# Patient Record
Sex: Female | Born: 1955 | Race: Black or African American | Hispanic: No | Marital: Married | Smoking: Never smoker
Health system: Southern US, Community
[De-identification: ages and names within clinical notes are randomized; demographics above are authoritative.]

## PROBLEM LIST (undated history)

## (undated) DIAGNOSIS — M1990 Unspecified osteoarthritis, unspecified site: Secondary | ICD-10-CM

## (undated) DIAGNOSIS — E785 Hyperlipidemia, unspecified: Secondary | ICD-10-CM

## (undated) DIAGNOSIS — E2740 Unspecified adrenocortical insufficiency: Secondary | ICD-10-CM

## (undated) DIAGNOSIS — H5440 Blindness, one eye, unspecified eye: Secondary | ICD-10-CM

## (undated) DIAGNOSIS — H53009 Unspecified amblyopia, unspecified eye: Secondary | ICD-10-CM

## (undated) DIAGNOSIS — D151 Benign neoplasm of heart: Secondary | ICD-10-CM

## (undated) DIAGNOSIS — E079 Disorder of thyroid, unspecified: Secondary | ICD-10-CM

## (undated) DIAGNOSIS — E039 Hypothyroidism, unspecified: Secondary | ICD-10-CM

## (undated) DIAGNOSIS — B9681 Helicobacter pylori [H. pylori] as the cause of diseases classified elsewhere: Secondary | ICD-10-CM

## (undated) DIAGNOSIS — R0902 Hypoxemia: Secondary | ICD-10-CM

## (undated) DIAGNOSIS — F32A Depression, unspecified: Secondary | ICD-10-CM

## (undated) DIAGNOSIS — D649 Anemia, unspecified: Secondary | ICD-10-CM

## (undated) DIAGNOSIS — IMO0001 Reserved for inherently not codable concepts without codable children: Secondary | ICD-10-CM

## (undated) DIAGNOSIS — N6009 Solitary cyst of unspecified breast: Secondary | ICD-10-CM

## (undated) DIAGNOSIS — E2749 Other adrenocortical insufficiency: Secondary | ICD-10-CM

## (undated) DIAGNOSIS — I639 Cerebral infarction, unspecified: Secondary | ICD-10-CM

## (undated) DIAGNOSIS — T7840XA Allergy, unspecified, initial encounter: Secondary | ICD-10-CM

## (undated) DIAGNOSIS — K259 Gastric ulcer, unspecified as acute or chronic, without hemorrhage or perforation: Secondary | ICD-10-CM

## (undated) DIAGNOSIS — F329 Major depressive disorder, single episode, unspecified: Secondary | ICD-10-CM

## (undated) DIAGNOSIS — Z5189 Encounter for other specified aftercare: Secondary | ICD-10-CM

## (undated) DIAGNOSIS — E249 Cushing's syndrome, unspecified: Secondary | ICD-10-CM

## (undated) DIAGNOSIS — K279 Peptic ulcer, site unspecified, unspecified as acute or chronic, without hemorrhage or perforation: Secondary | ICD-10-CM

## (undated) DIAGNOSIS — R51 Headache: Secondary | ICD-10-CM

## (undated) DIAGNOSIS — K219 Gastro-esophageal reflux disease without esophagitis: Secondary | ICD-10-CM

## (undated) DIAGNOSIS — R519 Headache, unspecified: Secondary | ICD-10-CM

## (undated) HISTORY — DX: Unspecified amblyopia, unspecified eye: H53.009

## (undated) HISTORY — DX: Peptic ulcer, site unspecified, unspecified as acute or chronic, without hemorrhage or perforation: K27.9

## (undated) HISTORY — DX: Hypothyroidism, unspecified: E03.9

## (undated) HISTORY — PX: EYE SURGERY: SHX253

## (undated) HISTORY — DX: Allergy, unspecified, initial encounter: T78.40XA

## (undated) HISTORY — DX: Benign neoplasm of heart: D15.1

## (undated) HISTORY — DX: Helicobacter pylori (H. pylori) as the cause of diseases classified elsewhere: B96.81

## (undated) HISTORY — DX: Solitary cyst of unspecified breast: N60.09

## (undated) HISTORY — PX: POLYPECTOMY: SHX149

## (undated) HISTORY — DX: Gastro-esophageal reflux disease without esophagitis: K21.9

## (undated) HISTORY — DX: Cerebral infarction, unspecified: I63.9

## (undated) HISTORY — DX: Other adrenocortical insufficiency: E27.49

## (undated) HISTORY — PX: OTHER SURGICAL HISTORY: SHX169

## (undated) HISTORY — PX: COLONOSCOPY: SHX174

---
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
## 1980-09-11 HISTORY — PX: ADRENAL GLAND SURGERY: SHX544

## 1998-05-17 ENCOUNTER — Emergency Department (HOSPITAL_COMMUNITY): Admission: EM | Admit: 1998-05-17 | Discharge: 1998-05-17 | Payer: Self-pay | Admitting: Emergency Medicine

## 1998-07-28 ENCOUNTER — Other Ambulatory Visit: Admission: RE | Admit: 1998-07-28 | Discharge: 1998-07-28 | Payer: Self-pay | Admitting: Podiatry

## 1998-09-11 HISTORY — PX: VAGINAL HYSTERECTOMY: SUR661

## 1999-06-22 ENCOUNTER — Other Ambulatory Visit: Admission: RE | Admit: 1999-06-22 | Discharge: 1999-06-22 | Payer: Self-pay

## 1999-06-25 ENCOUNTER — Encounter: Payer: Self-pay | Admitting: Emergency Medicine

## 1999-06-25 ENCOUNTER — Emergency Department (HOSPITAL_COMMUNITY): Admission: EM | Admit: 1999-06-25 | Discharge: 1999-06-25 | Payer: Self-pay | Admitting: Emergency Medicine

## 1999-06-30 ENCOUNTER — Emergency Department (HOSPITAL_COMMUNITY): Admission: EM | Admit: 1999-06-30 | Discharge: 1999-06-30 | Payer: Self-pay | Admitting: Emergency Medicine

## 1999-07-13 HISTORY — PX: BREAST BIOPSY: SHX20

## 1999-07-27 ENCOUNTER — Encounter: Payer: Self-pay | Admitting: *Deleted

## 1999-07-27 ENCOUNTER — Encounter (INDEPENDENT_AMBULATORY_CARE_PROVIDER_SITE_OTHER): Payer: Self-pay | Admitting: *Deleted

## 1999-07-27 ENCOUNTER — Ambulatory Visit (HOSPITAL_BASED_OUTPATIENT_CLINIC_OR_DEPARTMENT_OTHER): Admission: RE | Admit: 1999-07-27 | Discharge: 1999-07-27 | Payer: Self-pay | Admitting: *Deleted

## 1999-12-18 ENCOUNTER — Encounter: Payer: Self-pay | Admitting: Emergency Medicine

## 1999-12-18 ENCOUNTER — Emergency Department (HOSPITAL_COMMUNITY): Admission: EM | Admit: 1999-12-18 | Discharge: 1999-12-18 | Payer: Self-pay | Admitting: Emergency Medicine

## 2000-05-01 ENCOUNTER — Emergency Department (HOSPITAL_COMMUNITY): Admission: EM | Admit: 2000-05-01 | Discharge: 2000-05-01 | Payer: Self-pay | Admitting: Emergency Medicine

## 2000-05-19 ENCOUNTER — Emergency Department (HOSPITAL_COMMUNITY): Admission: EM | Admit: 2000-05-19 | Discharge: 2000-05-19 | Payer: Self-pay | Admitting: Emergency Medicine

## 2000-12-06 ENCOUNTER — Encounter: Payer: Self-pay | Admitting: Emergency Medicine

## 2000-12-06 ENCOUNTER — Emergency Department (HOSPITAL_COMMUNITY): Admission: EM | Admit: 2000-12-06 | Discharge: 2000-12-06 | Payer: Self-pay | Admitting: Emergency Medicine

## 2000-12-13 ENCOUNTER — Encounter (INDEPENDENT_AMBULATORY_CARE_PROVIDER_SITE_OTHER): Payer: Self-pay | Admitting: Specialist

## 2000-12-13 ENCOUNTER — Inpatient Hospital Stay (HOSPITAL_COMMUNITY): Admission: EM | Admit: 2000-12-13 | Discharge: 2000-12-15 | Payer: Self-pay | Admitting: Emergency Medicine

## 2000-12-13 ENCOUNTER — Encounter: Payer: Self-pay | Admitting: Emergency Medicine

## 2001-06-16 ENCOUNTER — Emergency Department (HOSPITAL_COMMUNITY): Admission: EM | Admit: 2001-06-16 | Discharge: 2001-06-16 | Payer: Self-pay | Admitting: Emergency Medicine

## 2001-07-22 ENCOUNTER — Emergency Department (HOSPITAL_COMMUNITY): Admission: EM | Admit: 2001-07-22 | Discharge: 2001-07-22 | Payer: Self-pay | Admitting: Emergency Medicine

## 2001-07-23 ENCOUNTER — Encounter: Payer: Self-pay | Admitting: Endocrinology

## 2002-01-01 ENCOUNTER — Emergency Department (HOSPITAL_COMMUNITY): Admission: EM | Admit: 2002-01-01 | Discharge: 2002-01-01 | Payer: Self-pay | Admitting: Emergency Medicine

## 2002-01-01 ENCOUNTER — Encounter: Payer: Self-pay | Admitting: Emergency Medicine

## 2002-01-22 ENCOUNTER — Encounter: Payer: Self-pay | Admitting: Hematology & Oncology

## 2002-01-22 ENCOUNTER — Ambulatory Visit (HOSPITAL_COMMUNITY): Admission: RE | Admit: 2002-01-22 | Discharge: 2002-01-22 | Payer: Self-pay | Admitting: Hematology & Oncology

## 2002-01-29 ENCOUNTER — Ambulatory Visit (HOSPITAL_COMMUNITY): Admission: RE | Admit: 2002-01-29 | Discharge: 2002-01-29 | Payer: Self-pay | Admitting: Hematology & Oncology

## 2002-01-29 ENCOUNTER — Encounter: Payer: Self-pay | Admitting: Hematology & Oncology

## 2002-02-27 ENCOUNTER — Encounter (INDEPENDENT_AMBULATORY_CARE_PROVIDER_SITE_OTHER): Payer: Self-pay | Admitting: Specialist

## 2002-02-27 ENCOUNTER — Encounter: Payer: Self-pay | Admitting: Hematology & Oncology

## 2002-02-27 ENCOUNTER — Ambulatory Visit (HOSPITAL_COMMUNITY): Admission: RE | Admit: 2002-02-27 | Discharge: 2002-02-27 | Payer: Self-pay | Admitting: Hematology & Oncology

## 2002-06-21 ENCOUNTER — Emergency Department (HOSPITAL_COMMUNITY): Admission: EM | Admit: 2002-06-21 | Discharge: 2002-06-21 | Payer: Self-pay | Admitting: Emergency Medicine

## 2002-07-16 ENCOUNTER — Encounter: Admission: RE | Admit: 2002-07-16 | Discharge: 2002-10-14 | Payer: Self-pay | Admitting: *Deleted

## 2004-02-08 ENCOUNTER — Emergency Department (HOSPITAL_COMMUNITY): Admission: EM | Admit: 2004-02-08 | Discharge: 2004-02-08 | Payer: Self-pay | Admitting: Emergency Medicine

## 2004-05-31 ENCOUNTER — Encounter: Admission: RE | Admit: 2004-05-31 | Discharge: 2004-05-31 | Payer: Self-pay | Admitting: Internal Medicine

## 2005-05-12 HISTORY — PX: ROTATOR CUFF REPAIR: SHX139

## 2005-05-27 ENCOUNTER — Inpatient Hospital Stay (HOSPITAL_COMMUNITY): Admission: EM | Admit: 2005-05-27 | Discharge: 2005-05-29 | Payer: Self-pay | Admitting: Emergency Medicine

## 2005-10-17 ENCOUNTER — Encounter: Admission: RE | Admit: 2005-10-17 | Discharge: 2005-11-15 | Payer: Self-pay | Admitting: Orthopedic Surgery

## 2005-11-16 ENCOUNTER — Encounter: Admission: RE | Admit: 2005-11-16 | Discharge: 2006-01-23 | Payer: Self-pay | Admitting: Orthopedic Surgery

## 2005-12-26 ENCOUNTER — Ambulatory Visit (HOSPITAL_COMMUNITY): Admission: RE | Admit: 2005-12-26 | Discharge: 2005-12-26 | Payer: Self-pay | Admitting: Cardiology

## 2006-09-10 ENCOUNTER — Ambulatory Visit: Payer: Self-pay | Admitting: Cardiology

## 2006-09-10 ENCOUNTER — Inpatient Hospital Stay (HOSPITAL_COMMUNITY): Admission: EM | Admit: 2006-09-10 | Discharge: 2006-09-13 | Payer: Self-pay | Admitting: Emergency Medicine

## 2006-09-12 ENCOUNTER — Encounter: Payer: Self-pay | Admitting: Cardiology

## 2006-10-15 ENCOUNTER — Emergency Department (HOSPITAL_COMMUNITY): Admission: EM | Admit: 2006-10-15 | Discharge: 2006-10-15 | Payer: Self-pay | Admitting: Emergency Medicine

## 2007-02-20 ENCOUNTER — Emergency Department (HOSPITAL_COMMUNITY): Admission: EM | Admit: 2007-02-20 | Discharge: 2007-02-20 | Payer: Self-pay | Admitting: Emergency Medicine

## 2007-03-22 ENCOUNTER — Ambulatory Visit (HOSPITAL_COMMUNITY): Admission: RE | Admit: 2007-03-22 | Discharge: 2007-03-22 | Payer: Self-pay | Admitting: Ophthalmology

## 2007-10-28 ENCOUNTER — Inpatient Hospital Stay (HOSPITAL_COMMUNITY): Admission: EM | Admit: 2007-10-28 | Discharge: 2007-10-30 | Payer: Self-pay | Admitting: Emergency Medicine

## 2008-04-19 ENCOUNTER — Emergency Department (HOSPITAL_COMMUNITY): Admission: EM | Admit: 2008-04-19 | Discharge: 2008-04-19 | Payer: Self-pay | Admitting: Emergency Medicine

## 2010-02-28 ENCOUNTER — Emergency Department (HOSPITAL_COMMUNITY): Admission: EM | Admit: 2010-02-28 | Discharge: 2010-02-28 | Payer: Self-pay | Admitting: Emergency Medicine

## 2010-10-02 ENCOUNTER — Encounter: Payer: Self-pay | Admitting: Cardiology

## 2010-11-08 ENCOUNTER — Inpatient Hospital Stay (INDEPENDENT_AMBULATORY_CARE_PROVIDER_SITE_OTHER)
Admission: RE | Admit: 2010-11-08 | Discharge: 2010-11-08 | Disposition: A | Discharge status: Home / Self Care | Payer: Self-pay | Source: Ambulatory Visit | Attending: Family Medicine | Admitting: Family Medicine

## 2010-11-08 DIAGNOSIS — S058X9A Other injuries of unspecified eye and orbit, initial encounter: Secondary | ICD-10-CM

## 2010-11-23 ENCOUNTER — Emergency Department (HOSPITAL_COMMUNITY): Payer: Medicaid Other

## 2010-11-23 ENCOUNTER — Inpatient Hospital Stay (HOSPITAL_COMMUNITY)
Admission: EM | Admit: 2010-11-23 | Discharge: 2010-11-26 | DRG: 644 | Disposition: A | Discharge status: Home / Self Care | Payer: Medicaid Other | Attending: Internal Medicine | Admitting: Internal Medicine

## 2010-11-23 DIAGNOSIS — R680 Hypothermia, not associated with low environmental temperature: Secondary | ICD-10-CM | POA: Diagnosis present

## 2010-11-23 DIAGNOSIS — F3289 Other specified depressive episodes: Secondary | ICD-10-CM | POA: Diagnosis present

## 2010-11-23 DIAGNOSIS — D151 Benign neoplasm of heart: Secondary | ICD-10-CM | POA: Diagnosis present

## 2010-11-23 DIAGNOSIS — A088 Other specified intestinal infections: Secondary | ICD-10-CM | POA: Diagnosis present

## 2010-11-23 DIAGNOSIS — K219 Gastro-esophageal reflux disease without esophagitis: Secondary | ICD-10-CM | POA: Diagnosis present

## 2010-11-23 DIAGNOSIS — F329 Major depressive disorder, single episode, unspecified: Secondary | ICD-10-CM | POA: Diagnosis present

## 2010-11-23 DIAGNOSIS — E161 Other hypoglycemia: Secondary | ICD-10-CM | POA: Diagnosis present

## 2010-11-23 DIAGNOSIS — R000 Tachycardia, unspecified: Secondary | ICD-10-CM | POA: Diagnosis present

## 2010-11-23 DIAGNOSIS — E872 Acidosis, unspecified: Secondary | ICD-10-CM | POA: Diagnosis present

## 2010-11-23 DIAGNOSIS — R112 Nausea with vomiting, unspecified: Secondary | ICD-10-CM | POA: Diagnosis present

## 2010-11-23 DIAGNOSIS — M6282 Rhabdomyolysis: Secondary | ICD-10-CM | POA: Diagnosis present

## 2010-11-23 DIAGNOSIS — R197 Diarrhea, unspecified: Secondary | ICD-10-CM | POA: Diagnosis present

## 2010-11-23 DIAGNOSIS — I959 Hypotension, unspecified: Secondary | ICD-10-CM | POA: Diagnosis present

## 2010-11-23 DIAGNOSIS — R4182 Altered mental status, unspecified: Secondary | ICD-10-CM | POA: Diagnosis present

## 2010-11-23 DIAGNOSIS — E2749 Other adrenocortical insufficiency: Principal | ICD-10-CM | POA: Diagnosis present

## 2010-11-23 DIAGNOSIS — E039 Hypothyroidism, unspecified: Secondary | ICD-10-CM | POA: Diagnosis present

## 2010-11-23 LAB — BLOOD GAS, ARTERIAL
Acid-base deficit: 12.1 mmol/L — ABNORMAL HIGH (ref 0.0–2.0)
Bicarbonate: 14.5 mEq/L — ABNORMAL LOW (ref 20.0–24.0)
FIO2: 0.21 %
pH, Arterial: 7.269 — ABNORMAL LOW (ref 7.350–7.400)

## 2010-11-23 LAB — POCT I-STAT, CHEM 8
Chloride: 115 mEq/L — ABNORMAL HIGH (ref 96–112)
HCT: 43 % (ref 36.0–46.0)
Potassium: 4.6 mEq/L (ref 3.5–5.1)
Sodium: 134 mEq/L — ABNORMAL LOW (ref 135–145)

## 2010-11-23 LAB — COMPREHENSIVE METABOLIC PANEL
ALT: 19 U/L (ref 0–35)
AST: 34 U/L (ref 0–37)
Alkaline Phosphatase: 44 U/L (ref 39–117)
CO2: 15 mEq/L — ABNORMAL LOW (ref 19–32)
Calcium: 7.8 mg/dL — ABNORMAL LOW (ref 8.4–10.5)
Chloride: 114 mEq/L — ABNORMAL HIGH (ref 96–112)
Creatinine, Ser: 1.18 mg/dL (ref 0.4–1.2)
GFR calc non Af Amer: 48 mL/min — ABNORMAL LOW (ref >60)
Glucose, Bld: 70 mg/dL (ref 70–99)
Potassium: 4.4 mEq/L (ref 3.5–5.1)

## 2010-11-23 LAB — LIPASE, BLOOD: Lipase: 18 U/L (ref 11–59)

## 2010-11-24 LAB — DIFFERENTIAL
Basophils Absolute: 0 10*3/uL (ref 0.0–0.1)
Eosinophils Relative: 4 % (ref 0–5)
Lymphocytes Relative: 40 % (ref 12–46)
Lymphs Abs: 2.9 10*3/uL (ref 0.7–4.0)
Monocytes Relative: 13 % — ABNORMAL HIGH (ref 3–12)
Neutro Abs: 3.2 10*3/uL (ref 1.7–7.7)

## 2010-11-24 LAB — URINALYSIS, ROUTINE W REFLEX MICROSCOPIC
Bilirubin Urine: NEGATIVE
Glucose, UA: NEGATIVE mg/dL
Specific Gravity, Urine: 1.019 (ref 1.005–1.030)
Urobilinogen, UA: 0.2 mg/dL (ref 0.0–1.0)

## 2010-11-24 LAB — GLUCOSE, CAPILLARY
Glucose-Capillary: 54 mg/dL — ABNORMAL LOW (ref 70–99)
Glucose-Capillary: 241 mg/dL — ABNORMAL HIGH (ref 70–99)
Glucose-Capillary: 244 mg/dL — ABNORMAL HIGH (ref 70–99)

## 2010-11-24 LAB — MRSA PCR SCREENING: MRSA by PCR: NEGATIVE

## 2010-11-24 LAB — CBC
HCT: 41 % (ref 36.0–46.0)
Hemoglobin: 13.4 g/dL (ref 12.0–15.0)
MCH: 29.9 pg (ref 26.0–34.0)
MCHC: 32.7 g/dL (ref 30.0–36.0)
MCV: 91.5 fL (ref 78.0–100.0)

## 2010-11-24 LAB — TROPONIN I: Troponin I: 0.04 ng/mL (ref 0.00–0.06)

## 2010-11-24 LAB — URINE MICROSCOPIC-ADD ON

## 2010-11-25 LAB — CBC
Hemoglobin: 11.4 g/dL — ABNORMAL LOW (ref 12.0–15.0)
MCH: 29.9 pg (ref 26.0–34.0)
MCHC: 33 g/dL (ref 30.0–36.0)
Platelets: 245 10*3/uL (ref 150–400)

## 2010-11-25 LAB — BASIC METABOLIC PANEL
BUN: 7 mg/dL (ref 6–23)
CO2: 23 mEq/L (ref 19–32)
Calcium: 8.5 mg/dL (ref 8.4–10.5)
Chloride: 107 mEq/L (ref 96–112)
Creatinine, Ser: 0.9 mg/dL (ref 0.4–1.2)
GFR calc Af Amer: >60 mL/min (ref >60)
Glucose, Bld: 209 mg/dL — ABNORMAL HIGH (ref 70–99)

## 2010-11-25 LAB — GLUCOSE, CAPILLARY
Glucose-Capillary: 145 mg/dL — ABNORMAL HIGH (ref 70–99)
Glucose-Capillary: 204 mg/dL — ABNORMAL HIGH (ref 70–99)

## 2010-11-26 LAB — CBC
HCT: 33.3 % — ABNORMAL LOW (ref 36.0–46.0)
Hemoglobin: 10.8 g/dL — ABNORMAL LOW (ref 12.0–15.0)
MCH: 29.8 pg (ref 26.0–34.0)
MCV: 91.7 fL (ref 78.0–100.0)
Platelets: 257 10*3/uL (ref 150–400)
RBC: 3.63 MIL/uL — ABNORMAL LOW (ref 3.87–5.11)
WBC: 9.6 10*3/uL (ref 4.0–10.5)

## 2010-11-26 LAB — CK: Total CK: 659 U/L — ABNORMAL HIGH (ref 7–177)

## 2010-11-26 LAB — BASIC METABOLIC PANEL
BUN: 9 mg/dL (ref 6–23)
CO2: 26 mEq/L (ref 19–32)
Chloride: 110 mEq/L (ref 96–112)
Creatinine, Ser: 0.77 mg/dL (ref 0.4–1.2)
Potassium: 3.8 mEq/L (ref 3.5–5.1)

## 2010-12-13 NOTE — Discharge Summary (Signed)
NAMEJARIKA, [name]            ACCOUNT NO.:  [id number]  MEDICAL RECORD NO.:  [id number]           PATIENT TYPE:  I  LOCATION:  1318                         FACILITY:  [facility]  PHYSICIAN:  Larina Earthly, M.D.        DATE OF BIRTH:  12/24/1955  DATE OF ADMISSION:  11/23/2010 DATE OF DISCHARGE:  11/26/2010                              DISCHARGE SUMMARY   DISCHARGE DIAGNOSES: 1. 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 2. Nausea, vomiting and diarrhea thought to be secondary to viral     gastroenteritis in the setting of Norovirus outbreak in the     community, now resolved with supportive intravenous fluids and     antiemetics, tolerating regular diets without incidence. 3. Rhabdomyolysis likely secondary to being bedbound for 3 days prior     to hospitalization in the setting of nausea and vomiting.  CK level     initially high at 814, now stable at 659 with no musculoskeletal     complaints, in need of recheck with adequate hydration on an     outpatient basis. 4. Hypothyroidism, clinically euthyroid with normal TSH. 5. Gastroesophageal reflux disease, asymptomatic on proton pump     inhibitor. 6. Depression, clinically stable.  DISCHARGE MEDICATIONS: 1. Effexor XR 150 mg p.o. daily. 2. Hydrocortisone 20 mg in the morning, half a tablet in the evening.     Patient to double dose for 3 days at the time of discharge and then     resume previous dose after these 3 days. 3. Omeprazole one p.o. b.i.d. that is 20 mg. 4. Synthroid 75 mcg one p.o. daily. 5. Visine 1 drop in both eyes as needed. 6. Ambien 10 mg p.o. q.h.s. p.r.n.  DISCHARGE INSTRUCTIONS:  Patient to call for followup office visit within the next 1 week.  Patient is also further  instructed to call if she develops any dizziness, lightheadedness, recurrent nausea, vomiting or diarrhea or altered mental status.  She is going to be discharged home with her husband.  She will need followup within the week for reevaluation of electrolytes, CK level, hemodynamic status and possible nutrition consult given her significant concerns of her history of hyperlipidemia.  DISCHARGE LABORATORY DATA:  Laboratory evaluation at last set indicates white blood cell count 9.6, hematocrit 10.8%, platelet count 257, hematocrit 33%.  Sodium 139, potassium 3.9, serum CO2 26, BUN 9, creatinine 0.77, glucose 111, calcium 8.6.  CK level now 659, previously 1140.  TSH normal at 0.909.  Liver function tests normal.  Lipase normal.  Albumin 2.7.  Lactic acid normal.  KUB, chest x-ray unremarkable.  Urinalysis unremarkable for infection.  HISTORY OF PRESENT ILLNESS:  Please see the history and physical dictated by my partner Dr. Buren Kos on November 24, 2010; however, briefly this is a 55 year old African-American female with a history of Carney syndrome, status post adrenalectomy with recurrent adrenal  crisis in 2007 and 2009, who presented to the emergency room with nausea, vomiting and diarrhea for 3 days with inability to keep food or liquids down.  She has tried to continue taking her hydrocortisone; however, given the nausea and vomiting, diarrhea, she was unable to do so and presented via EMS with hypotension, hypothermia, tachycardia, acidotic with a bicarb of 15 and a pH of 7.27 with depressed mental status.  She was subsequently admitted for further evaluation and management to include high-dose Solu-Cortef and IV fluids.  HOSPITAL COURSE:  Over the next 24-48 hours, the patient's mental status cleared.  Patient remained hemodynamically stable and her Solu-Cortef dose was discontinued and prior to discharge, the patient was ambulatory without hindrance, eating well without nausea,  vomiting or diarrhea. Denied any musculoskeletal complaints including muscle aches or joint aches.  She denied any focal neurological deficits with the exception of her congenital deviated eye abnormalities.  She states that she is able to drink adequate amounts of fluid and denied any abdominal complaints. She was thought appropriate for discharge with her husband and quit followup in the office as dictated above.  Greater than 30 minutes spent in discharge planning, arrangements and evaluation of patient and laboratory values.     Larina Earthly, M.D.     RA/MEDQ  D:  11/26/2010  T:  11/27/2010  Job:  [id number]  Electronically Signed by Larina Earthly M.D. on 12/13/2010 10:11:54 PM

## 2010-12-28 NOTE — H&P (Signed)
NAMEJOAQUINA, [name] NO.:  [id number]  MEDICAL RECORD NO.:  [id number]           PATIENT TYPE:  E  LOCATION:  WLED                         FACILITY:  [facility]  PHYSICIAN:  Kari Baars, M.D.  DATE OF BIRTH:  05-Mar-1956  DATE OF ADMISSION:  11/24/2010 DATE OF DISCHARGE:                             HISTORY & PHYSICAL   CHIEF COMPLAINT:  Nausea, vomiting, and diarrhea.  HISTORY OF PRESENT ILLNESS:  [name] is a 55 year old African American female with a history of Carney syndrome, status post adrenalectomy with recurrent adrenal crises (2007 and 2009) who presented to the emergency department with nausea, vomiting, and diarrhea for the past 3 days.  The patient states that she was in her usual state of health until Monday (3 days ago) when she developed acute onset nausea, vomiting, and diarrhea.  This has been characterized by at least 10 episodes of food to clear emesis per day.  She has had about 5 episodes of watery diarrhea per day.  She has had difficulty keeping liquids, solids, and her medications down.  This includes her hydrocortisone which she has continued to try to take religiously but is having difficulty keeping anything down.  She was brought to the emergency department by EMS where she was initially hypotensive and hypothermic, tachycardic, and acidotic (bicarb 15 with a pH of 7.27, pCO2 of 32, and pO2 of 79 on initial blood gas).  Her mental status was initially depressed but has improved with hydration, although she still feels very weak even after 3 L of IV fluids.  We were called for admission, and I ordered the dose of Solu-Cortef which she has now received.  REVIEW OF SYSTEMS:  All systems reviewed with the patient are negative except in HPI with following exceptions:  Diffuse abdominal pain. Denies chest pain.  She has been bed-bound for the past 3 days and feels very weak.  PAST MEDICAL HISTORY: 1. Carney syndrome, status post  adrenalectomy and status post atrial     myxoma resection. 2. Hypothyroidism. 3. 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 4. Gastroesophageal reflux disease. 5. Depression. 6. Hyperlipidemia. 7. History of peptic ulcer disease with H. pylori positive. 8. Migraine headaches. 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 11.Status post partial hysterectomy. 12.Amblyopia, status post prior eye surgery.  CURRENT MEDICATIONS: 1. Effexor XR 150 mg daily. 2. Neurontin 300 mg at bedtime. 3. Levoxyl 75 mg daily. 4. Omeprazole 20 mg b.i.d. 5. Hydrocortisone 20 mg in the morning and one half in the evening     (according to our records, this was refilled on October 07, 2010,     for 4 months and she reports compliance with this medication).  ALLERGIES: 1. PENICILLIN. 2. ASPIRIN.  SOCIAL HISTORY:  She is a widow and is remarried.  She is 2 grown children.  She is disabled due to her Carney.  Denies any tobacco, alcohol, or drug use.  FAMILY HISTORY:  Positive for inherited Carney syndrome and acromegaly.  PHYSICAL EXAMINATION:  VITAL SIGNS:  Temperature 94.3 rectally initially and  98.1, currently off of bear hugger.  Blood pressure initially 90/43 and now 114/51, heart rate 123, respirations 20, and ox saturation 100% on room air. GENERAL:  Lethargic African American female lying in fetal position. HEENT:  She has a disconjugate gaze.  No scleral icterus.  Oropharynx is dry. NECK:  Supple without lymphadenopathy, JVD, or carotid bruits. HEART:  Tachycardic without murmurs, rubs, or gallops. LUNGS:  Clear to auscultation bilaterally. ABDOMEN:  Soft and nondistended with diffuse tenderness throughout.  No rebound or guarding.  No mass appreciated. EXTREMITIES:  No clubbing, cyanosis, or edema.  Her extremities are warm. NEUROLOGIC:  Nonfocal but she does have a flat affect.  LABORATORY DATA:  CBC  shows a white count of 7.3, hemoglobin of 13.4, and platelets 231,000.  BMET significant for sodium 134, potassium 4.4, chloride 114, bicarb 15, BUN 36, creatinine 1.18, glucose 70.  Albumin 2.7.  Liver function tests normal.  TSH 0.909, lactic acid 1.4, and troponin 0.04.  CK 814 with 5.6 MB, lipase 18.  Urinalysis negative. Arterial blood gas drawn at 11:00 p.m. was pH 7.27, pCO2 of 32, and pO2 of 79.  DIAGNOSTIC STUDIES:  Acute abdominal series personally reviewed shows normal bowel gas pattern.  EKG is reportedly normal per the ED, although not available for review at this moment.  ASSESSMENT/PLAN: 1. Adrenal crisis resulting in hypotension, tachycardia, hypothermia,     and metabolic acidosis with altered mental status.  The patient     does report compliance with medications but has been unable to     maintain p.o. intake in the setting of her nausea, vomiting, and     diarrhea (likely viral gastroenteritis).  She will be admitted for     intravenous Solu-Cortef which she has already received x1.     Aggressive intravenous fluid hydration.  She appears to be     stabilizing hemodynamically but we will monitor closely in the step-     down unit given her acute presentation. 2. Nausea, vomiting, and diarrhea - likely secondary to viral     gastroenteritis in the setting of a Norovirus outbreak in the     community.  We will treat supportively with intravenous fluids and     antiemetics.  Clear liquid diet, will be advanced as tolerated. 3. Rhabdomyolysis - likely secondary to being bed bound for the past 3     days and vomiting.  Monitor creatine kinase levels with hydration.     Normal renal function at this time. 4. Hypoglycemia - this is secondary to adrenal crisis.  This should     improve with steroids but will be monitored. 5. Hypothyroidism - continue Synthroid. 6. Depression - continue Effexor as tolerated.  DISPOSITION:  Anticipate discharge in 3-4 days to home with her  husband.     Kari Baars, M.D.     WS/MEDQ  D:  11/24/2010  T:  11/24/2010  Job:  [id number]  cc:   [name], M.D. Fax: [phone]  Electronically Signed by [name]. [name] M.D. on 12/28/2010 02:39:48 PM

## 2011-01-24 NOTE — Discharge Summary (Signed)
NAMEJIMENA, WIECZOREK NO.:  [id number]   MEDICAL RECORD NO.:  [id number]          PATIENT TYPE:  INP   LOCATION:  1342                         FACILITY:  [facility]   PHYSICIAN:  Gwen Pounds, MD       DATE OF BIRTH:  May 07, 1956   DATE OF ADMISSION:  10/28/2007  DATE OF DISCHARGE:  10/30/2007                               DISCHARGE SUMMARY   PRIMARY CARE Jini Horiuchi:  Jeannett Senior A. [name], M.D.   DISCHARGE DIAGNOSES:  1. Gastroenteritis-induced adrenal crisis with nausea, vomiting, and      diarrhea.  2. History of prior adrenal crisis with gastrointestinal infection      versus medication noncompliance.  3. Peptic ulcer disease with H. Pylori in 2002.  4. Gastroesophageal reflux.  5. History of anemia.  6. Hypothyroidism.  7. Depression.  8. Insomnia.  9. Hyperlipidemia.  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  11.History of partial hysterectomy and breast cysts removed and      recurrent headaches.  12.Amblyopia.   DISCHARGE MEDICATIONS:  1. Omeprazole 20 q. day.  2. Effexor XR 150 q. day.  3. Ambien 10 mg at bedtime p.r.n.  4. Levoxyl 88 mcg daily.  5. Diazepam 5 mg three times a day as needed.  6. Hydrocortisone 40 mg in the morning, 20 mg in the afternoon for one      week, then 20 mg in the morning and 10 mg  In the afternoon thereafter.  1. Simvastatin 40 mg p.o. q. daily.   She is to follow up with Dr. Laurene Footman in one to two weeks.  She will  call Tisha at [phone] to set up an appointment.   DISCHARGE PROCEDURES:  Include medical management.   HISTORY OF PRESENT ILLNESS:  Briefly, Ms. Heumann is a 55 year old  female who presented to medical attention with a three-day history of  nausea, vomiting, and diarrhea and exposure to gastroenteritis.  In the  ED, she was seen and evaluated.  Her vitals were all normal.  She was  not in any distress.  Urinalysis was negative with a high specific  gravity.  Lipase was 14.  Liver tests  were fine except for AST of 47.  Sodium was a little low at 131, BUN was 21, creatinine was 1.37.  Dr.  Rodrigo Ran was called for inpatient admission.   HOSPITAL COURSE:  Ms. Shakelia Scrivner had an outpatient viral  gastroenteritis which was causing her to start getting dehydrated and  starting to go into adrenal insufficiency.  She had mild hyponatremia.  Dr. Waynard Edwards  saw her in the emergency room, admitted her, hydrated her  with normal saline, placed her on a clear liquid diet, gave her  antiemetics and IV cortisone.  Follow up labs here at the hospital stay  and gave supportive care.  She improved dramatically and quite quickly.  The following day on February 17, her sodium was up to 134, her bicarb  was down to 17, but her BUN and creatinine improved.  Her albumin was a  little low at 3.3.  Physical exam started to improve.  She felt better.  The next day on rounds I saw her.  She had no complaints.  She was  eating, drinking, and walking.  She had no nausea or vomiting.  All her  vital signs were stable.  She was alert and oriented x3.  It was easily  determined that she can go home today, and she will follow up by Dr.  [name] in one to two weeks.  Her sister is in the room and requesting a  nutrition consult before she leaves the hospital due to the fact that  Ms. Dorthy Cooler eats incredibly poorly including potato chips for  breakfast and junk food throughout the rest of the day.  Family wants  this stopped and wants her to eat a more balanced, more nutritious meal.  I explained the concept of adrenal crisis to the patient and told her  why we are bumping up the steroids and when to bring them back down.  With her sister in the room, she seems to understand and get what her  goals are.  On October 30, 2007, there were no new labs.  Her last TSH  was 0.238.  Her last CMP showed a sodium of 134, potassium 4.7, chloride  103, bicarb 17, glucose 139, BUN 21, creatinine 1.15,  albumin was 3.3.  Her last CBC showed a white count of 5.1, hemoglobin 14.3, platelet  count was 269, phosphorus 2.7, magnesium 1.9.  This lab work can be  easily repeated as an outpatient.  She looks great and is ready to go  home.      Gwen Pounds, MD  Electronically Signed     JMR/MEDQ  D:  10/30/2007  T:  10/30/2007  Job:  [id number]   cc:   Tera Mater. [name], M.D.  Fax: [phone]

## 2011-01-24 NOTE — H&P (Signed)
[name], MINCHEW NO.:  [id number]   MEDICAL RECORD NO.:  [id number]          PATIENT TYPE:  EMS   LOCATION:  ED                           FACILITY:  [facility]   PHYSICIAN:  Mark A. Perini, M.D.   DATE OF BIRTH:  1956/09/05   DATE OF ADMISSION:  10/28/2007  DATE OF DISCHARGE:                              HISTORY & PHYSICAL   CHIEF COMPLAINT:  Nausea, vomiting.   HISTORY OF PRESENT ILLNESS:  [name] is a 55 year old female known to me  from her admission approximately 1 year ago.  She presents with vomiting  and diarrhea which started 3 days ago.  There were some sick contacts  and there has been a gastroenteritis circulating in the community.  She  denies any blood from above or below.  She has had some abdominal  discomfort.  It is felt she will require admission for probable adrenal  crisis.   PAST MEDICAL HISTORY:  1. Admission in December 2007 for adrenal crisis secondary to possible      viral GI infection versus medication noncompliance.  2. Abnormal cardiac enzymes with a normal cardiac echo at that time.  3. Peptic ulcer disease with H. pylori in 2002.  4. Gastroesophageal reflux.  5. History of anemia.  6. Hypothyroid.  7. Depression.  8. Insomnia.  9. Hyperlipidemia.  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  11.History of partial hysterectomy and breast cyst removed and      recurrent headaches.   ALLERGIES:  1. ASPIRIN.  2. PENICILLIN.  3. IBUPROFEN.   MEDICATIONS:  1. Omeprazole 20 mg daily.  2. Effexor XR 150 mg daily.  3. Ambien 10 mg each evening as needed.  4. Levoxyl 88 mcg daily.  5. Diazepam 5 mg daily.  6. Hydrocortisone 20 mg in the morning, 10 mg in the p.m.  7. Zocor possibly 40 mg each evening.  She is not entirely sure if she      is on this.   In the Emergency Room she has been given Zofran dose twice and 100 mg of  IV hydrocortisone as well as Imodium and normal saline.   SOCIAL HISTORY:  She has 2  children.   FAMILY HISTORY:  Noncontributory.   PHYSICAL EXAM:  VITAL SIGNS:  Temperature 97.7, blood pressure 109/63,  pulse 53, respiratory 18, 100% saturation on room air.  GENERAL:  She is in no acute distress.  She is alert.  NECK:  No JVD.  HEENT:  No pallor or icterus.  LUNGS:  Clear to auscultation bilaterally with no wheezes, rales, or  rhonchi.  HEART:  Regular rate and rhythm with no significant murmur, rub, or  gallop.  ABDOMEN:  Soft with positive bowel sounds.  It is slightly tender but  there is no rebound, no guarding, and there are no masses.  EXTREMITIES:  There is no peripheral edema.  She moves extremities x4.  NEUROLOGIC:  Grossly neurologically intact.  She does have anisocoria.   LABORATORY STUDIES:  Urinalysis shows many squamous cells, zero to 2  white cells, 3-to-6 red cells, and many bacteria.  Urinalysis is  turbid  with a specific gravity of 1.026, small bilirubin, and small blood.  Lipase is 14.  Liver tests are normal with the exception of a slightly  elevated AST of 47.  Sodium 131, potassium 4.3, chloride 98, CO2 18, BUN  21, creatinine 1.37, glucose 102.  GFR 50, baseline BUN and creatinine  were 9 and 0.8 from 1 year ago.   ASSESSMENT AND PLAN:  A 55 year old female with adrenal insufficiency  with 3 days of nausea, vomiting, and inability to take her medications.  She is most likely in adrenal crisis.  She also has hyponatremia and  some acute renal insufficiency and some abdominal discomfort which is  most likely due to her adrenal crisis or gastroenteritis.  We will admit  her.  We will hydrate with normal saline.  We will place her on a clear  liquid diet.  We will treat with antiemetics and we will give her IV  hydrocortisone.  We will follow up lab work including TSH, CBC, diff,  magnesium, and phosphorus.  Care will be mostly supportive in nature,  however, if she continues to have abdominal pain she may need further  evaluation such as CT  scan.      Mark A. Perini, M.D.  Electronically Signed     MAP/MEDQ  D:  10/28/2007  T:  10/29/2007  Job:  [id number]

## 2011-01-27 NOTE — Discharge Summary (Signed)
[name], [name]            ACCOUNT NO.:  [id number]   MEDICAL RECORD NO.:  [id number]          PATIENT TYPE:  INP   LOCATION:  1429                         FACILITY:  [facility]   PHYSICIAN:  Tera Mater. [name], M.D. DATE OF BIRTH:  1956-08-18   DATE OF ADMISSION:  09/10/2006  DATE OF DISCHARGE:  09/13/2006                               DISCHARGE SUMMARY   DISCHARGE DIAGNOSES:  1. Acute adrenal crisis due to nausea and vomiting and missed      medications, now clinically resolved.  2. Probable viral gastroenteritis.  3. Abnormal cardiac enzymes, exact source uncertain.  4. Normal cardiac echo.  5. Peptic ulcer disease with mild anemia.  6. Primary hypothyroidism.  7. Depression.  8. Insomnia.  9. Hyperlipidemia.   CONSULTATIONS:  Were with cardiology, I believe Dr. Jens Som saw her the  first day.   PROCEDURES:  Included an echocardiogram.   [name] is a 55 year old black female with a history of Carney's  syndrome with prior adrenalectomy as well as atrial myxoma.  She  presented to my partner, Dr. Waynard Edwards on September 10, 2006 with nausea,  vomiting, hypotension and electrolyte disorders consistent with adrenal  crisis.  She had missed two to three days of her medications and this is  in the setting of a significant illness.  Fortunately with IV steroids  and fluid resuscitation, she improved fairly promptly.  Her cardiac  enzymes, however, were significantly abnormal and cardiology was asked  to see the patient.  The exact cause of her abnormal enzymes has not  been delineated.  She does have abnormal echo.  It is concerning the  levels were as high as they were without more hypotension or severe  sepsis or other cause.  Further investigation will be undertaken as an  outpatient.  The patient is doing well.  She has had no significant  fever.  Blood pressure this morning was 116/64, pulse is 98 and sinus  rhythm on the monitor.  Respirations 16, O2 sats 97% on room  air.  She  has no complaints this morning.  She is eating well.  She is not  orthostatic in symptomatology and overall much improved.   LABORATORY DATA:  This morning a white count 7700, hemoglobin was 10.5,  MCV 89.9, platelets 285,000.  Her sodium was 136, potassium 3.7,  chloride 109, CO2 26, BUN 9, creatinine 0.8, glucose 77, calcium was  8.2.  Prior labs also included a total cholesterol of 208, triglycerides  132, HCl of 30 and LDL of 152.  Her white count at presentation was  8500, hemoglobin 17.4, platelets 365,000.  A blood gas on 2 liters pH  7.338, pCO2 of 37, pO2 of 132.  Chemistries at presentation sodium 132,  potassium 4.2, chloride 98, CO2 21, BUN 23, creatinine 1.4, glucose 90,  calcium 10.4.  Albumin was low at 3.2, total bili was 1.7, AST was 33,  alk phos was 46 and ALT was 16.  Magnesium was 1.7, phosphorous 4.0.  Initial CK was 515 with an MP of 10.6 and a relative index of 2.1 with a  troponin 0.47.  Repeat was 474 with an MP of 10.4, relative index of 2.2  and a troponin of 0.46.  Repeat 8 hours later CK of 412, CK-MP of 9.6  with a relative index of 2.3 and troponin 0.43.  Subsequent numbers CK  325, MP of 8.5, relative index 2.6 and troponin 0.34 and yesterday  morning CK of 140, MP 4.7, relative index 3.1 and troponin 0.33.   RADIOLOGY STUDIES:  A chest x-ray showed no acute cardiopulmonary  abnormalities on both the 31st and the 1st.  Her echocardiogram showed  conclusions overall left ventricular systolic function was normal, left  ventricular ejection fraction estimated 55 to 65%.  No left ventricular  regional wall motion abnormalities.  No pericardial effusion.   SUMMARY:  we have a 55 year old black female presenting with adrenal  crisis with additional problems of abnormal cardiac enzymes.  The  adrenal problem has settled back down and the patient will be given  double dose of her routine steroids for an addition of one week and then  return to her  baseline therapy.  Her thyroid medication will be  continued.  Further cardiac workup will be as determined by the  cardiology service.  Her diet will be regular.  She has no pain needs.   DISCHARGE MEDICATIONS:  Include omeprazole 20 twice daily, she will now  be on aspirin 81 milligrams once daily.  She will be started back on her  Effexor XR 150 once daily, Neurontin 300 once daily, hydrocortisone will  be 40 in the morning and 20 in the evening for one week and then 20 in  the morning and 10 in the evening thereafter.  Her new medication of  Zocor as a generic 40 milligrams once daily.  Her thyroid medication  will be 88 micrograms once daily.  She is to call if she has chest pain  or repeat nausea and vomiting or other difficulties.           ______________________________  Tera Mater [name], M.D.     SAS/MEDQ  D:  09/13/2006  T:  09/13/2006  Job:  [id number]
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
## 2011-01-27 NOTE — Consult Note (Signed)
[place]  Patient:    [name], [name]                   MRN: [id number] Proc. Date: 12/13/00 Adm. Date:  [id number] Attending:  Lorre Nick CC:         Lennon Alstrom. Felipa Eth, M.D.  Petra Kuba, M.D.   Consultation Report  [name] is a 55 year old female whom I am asked to see by Dr. Felipa Eth because of intractable nausea and vomiting with small amount of blood in the vomitus according to the patient.  She denies any melena or hematochezia.  She has a past history of reflux diseases and peptic ulcers in 1997 documented in the stomach and duodenum on upper GI series.  At that time, she was Helicobacter pylori positive on both antibody tests and on biopsies which were not done until after the ulcers had healed with Prilosec.  She had been somewhat noncompliant with office followup and with appointments with endoscopy and apparently did not complete her anti-Helicobacter antibiotic regimen.  She reports to me that she is supposed to be on a proton pump inhibitor for chronic reflux, but she cannot afford it, so she has not taken it in a long time.  Her affect is extremely depressed and sullen, and she does not elaborate when answering questions.  Apparently she has a history of depression and has a variety of possibly related complaints such as back aches and headaches.  PAST MEDICAL HISTORY:  Interesting in that she has a history of adrenal insufficiency after having questionable adrenal masses removed in earlier life.  She also is hypothyroid and has a history of depression.  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  FAMILY HISTORY:  Pertinent for a father with ulcers and gallstones.  SOCIAL HISTORY:  She is widowed, nonsmoker, nondrinker.  REVIEW OF SYSTEMS:  General:  No weight loss or night sweats.  Endocrine: Positive for adrenal insufficiency on Cortisol and hypothyroidism.  Skin:  No rash or pruritus.  Eyes: No icterus or change in vision.  ENT: No aphthous ulcers or chronic sore throat.  Respiratory: No shortness of breath, cough, or wheezing.  Cardiac: As above.  GI: As above.  GU: No dysuria or hematuria. The remainder of the review of systems is negative.  PHYSICAL EXAMINATION:  GENERAL:  She is a very depressed-appearing female in no acute distress.  VITAL SIGNS:  Afebrile with a blood pressure of 151/98 and a pulse of 92. Respiratory rate is 16.  SKIN:  Normal.  HEENT:  Eyes are anicteric.  Oropharynx is unremarkable.  NECK:  Supple without thyromegaly.  There is no cervical or inguinal adenopathy.  CHEST:  Clear.  HEART:  Regular rate and rhythm without murmurs, rubs, or gallops.  ABDOMEN:  Soft with epigastric and right upper quadrant tenderness.  There are normal bowel sounds without rebound or mass.  I do not appreciate any hernias.  RECTAL:  Exam not done.  EXTREMITIES:  Without edema.  LABORATORY DATA:  Hemoglobin 14.6.  Electrolytes are normal.  White blood count is normal.  Creatinine 12.  LFTs are normal.  Amylase is minimally elevated at 146, but lipase is normal.  Urinalysis reveals ketones.  Electrocardiogram reveals normal sinus rhythm with question of inferior ischemia and anterolateral ischemia.  Chest x-ray reveals mild cardiomegaly.  Abdominal x-ray series is negative.  Abdominal ultrasound done last week when she presented with  similar symptoms demonstrates a mildly prominent pancreatic duct but is otherwise normal.  IMPRESSION:  A 55 year old female with nausea, vomiting, and abdominal discomfort but essentially normal laboratory parameters.  I suspect the minimally elevated amylase is insignificant, and the ultrasonogram does not show any specifically worrisome pancreatic pathology.  She may have an exacerbation of peptic disease or reflux possibly aggravated by her depression.  PLAN:  Hydrate the patient with  intravenous Protonix and Reglan.  Follow serial hemoglobins and stool guaiacs.  I do not believe she is bleeding significantly.  I will consider endoscopy tomorrow if she is agreeable. DD:  12/13/00 TD:  12/13/00 Job: [id number]

## 2011-01-27 NOTE — H&P (Signed)
[place]  Patient:    [name], [name] Visit Number: [id number] MRN: [id number]          Service Type: MED Location: 1800 [id number] 01 Attending Physician:  Shelba Flake Dictated by:   Gaspar Garbe, M.D. Admit Date:  07/22/2001                           History and Physical  PRIMARY CARE PHYSICIAN:  [name], [facility].  CHIEF COMPLAINT:  Nausea, vomiting, dizziness.  HISTORY OF PRESENT ILLNESS:  The patient is a 55 year old African-American female with known hypothyroidism and adrenal insufficiency for seven to eight years with variable compliance in her medications.  She indicates that she was last seen by Dr. [name] approximately one week ago; however, she states that she was having abdominal pain for a period of about two weeks.  She indicates that in the past week she had been fairly noncompliant with her medications and, over the past couple of days, has developed nausea, vomiting, diarrhea, and worsening abdominal pain to the point that even if she were compliant with her medications she was unable to keep them down.  She states that she has had continued burning abdominal pain since April and has received an EGD and treatment for H. pylori as well as proton pump inhibitors which she finds hard to be able to afford during her current economic situation.  She states multiple abdominal pain complaints including pain with diarrhea.  She states that she is having blood in her bowel movements and that her bowel movements have been dark and tarry all the time.  She was seen last week but did not indicate any of these specific problems at that time while in the office.  In the emergency room, the patient had continued nausea and vomiting.  No coffee grounds or vomiting consistent with bleeding was seen; however, the patient did have some bilious vomiting.  She was very orthostatic at that point, having  pulse increase of 15 with standing and a differential of 20 points in her systolic blood pressure.  At that point she received 2 L of IV normal saline and later a third, in addition to 100 mg of hydrocortisone by IV, with significant improvement of her blood pressure into the 110 range, whereas it had been 70s/40s when she initially came in.  The patient had been admitted in April 2002, for an episode of nausea and vomiting of one to two weeks.  Did not have orthostasis at that time and was not thought to be in adrenal insufficiency.  Had been seen by Dr. [name] in consult and had an EGD with biopsy which was H. pylori positive, and the patient was placed on a proton pump inhibitor and Reglan.  She also had a normal ultrasound at that time.  ALLERGIES:  No known drug allergies.  MEDICATIONS: 1. Phenergan p.r.n. 2. Protonix 40 mg p.o. q.d. 3. Hydrocortisone 20 mg p.o. q.d. 4. Synthroid 100 mcg p.o. q.d.  PAST MEDICAL HISTORY: 1. Hypothyroidism. 2. Adrenal insufficiency. 3. GERD with peptic ulcer, H. pylori-positive, last in April 2002. 4. Depression, with multiple somatic complaints, as stated in her earlier    charts. 5. Benign cardiac tumor removed at age 55. 6. History of recurrent headaches. 7. 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  SOCIAL HISTORY:  The patient lives in [place] with close family.  She  has two children, ages 66 and 26, who do not live with her.  She is widowed.  She works as a Advertising account planner.  States no smoking or drinking history at this point.  FAMILY HISTORY:  Positive for coronary artery disease with a relative having had a bypass.  Hypertension, diabetes, ulcer, and gallstones.  REVIEW OF SYSTEMS:  The patient notes some chills and continued nausea with recent vomiting and some diarrhea as well.  She notes bright red blood per rectum and abdominal pain into her left lower quadrant as well as substernal secondary  to wretching and reflux.  She notes some overall weakness but does not note any chest pain, shortness of breath, or any rashes, lesions, or stated exposure to anyone who has been ill with upper respiratory infection or GI illness.  She does not note herself as being febrile.  ADVANCE DIRECTIVES:  The patient is a full code.  PHYSICAL EXAMINATION:  VITAL SIGNS:  Temperature 97.2, pulse 114, respiratory rate 18, blood pressure 110/68, oxygen saturation 98% on room air.  GENERAL:  Ill-appearing and semi-cooperative with examination.  HEENT:  Normocephalic, atraumatic.  PERRLA.  EOMI.  ENT within normal limits. Oral mucosa is moist, although the patient drank fluids prior to being seen in exam.  NECK:  Supple.  Without lymphadenopathy, JVD, or bruit.  HEART:  Regular rate and rhythm, with a grade 1/2 systolic ejection murmur heard best over the right upper sternal border.  Pulses are 2+ and equal bilaterally without bruits.  LUNGS:  Clear to auscultation bilaterally.  SKIN:  Without rashes or lesions.  ABDOMEN:  Soft.  Hyperactive bowel sounds with some slight guarding, and reacts with diffuse pain with light palpation, but considerably less reaction when the palpation is done with the stethoscope.  No rebound was appreciated.  EXTREMITIES:  No clubbing, cyanosis, or edema.  MUSCULOSKELETAL:  No joint deformities, effusions, or tenderness.  NEUROLOGIC:  Cranial nerves II-XII are grossly intact.  Oriented x 3. Strength 5/5 bilaterally with normal sensation.  LABORATORY DATA:  White count 7, hemoglobin 15.1, hematocrit 44.2, platelets 277.  BUN 18, creatinine 1.1, sodium 134, potassium 4.0, glucose 64, total bilirubin 0.9, AST 33, ALT 20, alkaline phosphatase 42, lipase 20.  Urinalysis with specific gravity of 1.028 with a pH of 5 and positive ketones of 40.  LE and nitrate are negative as well as glucose and protein.  ASSESSMENT AND PLAN: 1. Adrenal insufficiency:  Possibly  due to noncompliance and secondarily due    to inability to comply because of nausea and vomiting.  The patient     received 100 mg of hydrocortisone in the emergency room.  We will continue    her with 100 mg IV q.6h. to be tailored back as her blood pressure comes    up.  She will receive a fourth L of normal saline as bolus and then be run    at 200 cc/hr.  She will have chemistries drawn in the morning to see    whether she requires potassium replacement, which is likely. 2. Hypotension:  As above, including fluids and steroids. 3. Abdominal pain:  Since she was unable to have one done in the emergency    room due to her orthostasis, will obtain an abdominal three-way film to    look for any bowel edema or indication of perforation, although this is    highly unlikely.  She does not have specific biliary complaints and has    normal LFTs at  this point.  She does note a history of possible blood in    her stool, but I find her history on her abdominal complaints to be fairly    unreliable given her exam as well as finding it extremely unlikely that she    has lost any reasonable amount of blood, even with some dehydration, with    an hematocrit of 44.  I do not believe this to be necessarily chronic.  If    she has bleeding, it is most likely upper and related to ulcers, which has    been known with her in the past.  Will defer consult for GI to Dr. Lyndle Herrlich    judgment once a three-way can be obtained and the above issues dealt with.    Continue her with Protonix IV and Phenergan as needed for nausea.   Will    supplement Ativan if Phenergan breaks through. 4. Hypothyroidism:  Will continue the patient with her Synthroid dose at    current IV, and will check a TSH in the morning until she is able to    tolerate p.o. intake. Dictated by:   Gaspar Garbe, M.D. Attending Physician:  Shelba Flake DD:  07/23/01 TD:  07/23/01 Job: [id number]
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
## 2011-01-27 NOTE — Op Note (Signed)
[place]  Patient:    [name]                    MRN: [id number] Proc. Date: 07/27/99 Adm. Date:  [id number] Attending:  Kandis Mannan CC:         Tera Mater. [name], M.D.             Thomas B. Samuella Cota, M.D.                           Operative Report  PREOPERATIVE DIAGNOSIS: 1. Bloody nipple discharge, right breast. 2. Abnormal mammogram, left breast.  POSTOPERATIVE DIAGNOSIS: 1. Bloody nipple discharge, right breast. 2. Abnormal mammogram, left breast.  OPERATION PERFORMED: 1. Excision of ductal tissue and biopsy of right breast. 2. Needle localization and biopsy of the left breast.  SURGEON:  Donnie Coffin. Samuella Cota, M.D.  ANESTHESIA:  General.  ANESTHESIOLOGIST:  Dr. Jacklynn Bue and CRNA Morris.  DESCRIPTION OF PROCEDURE:  The patient was taken to the operating room and placed on the table in supine position.  After satisfactory general anesthetic with LMA intubation, both breasts were prepped and draped as a sterile field.  The right  breast was operated upon first.  The patient had had a nipple discharge which was brown.  A circumareolar incision was made from about 7 oclock to 11 oclock. The incision was made through skin and subcutaneous tissues and then the ductal tissue beneath the nipple and areola was divided.  A large pocket of brown fluid was encountered at about the 9 oclock position.  As I dissected through the breast tissue to remove the tissue at the 7 to 11 oclock position, the patient had very thick, toothpaste consistency grayish-green collection in large dilated ducts.  This was quite remarkable.  There was no bright red bleeding noted.  The area was quite involved with dilated ducts containing this toothpaste consistency material. A rather large generous biopsy was taken of the breast tissue beneath the nipple and areola and toward the 9 oclock position.  Bleeding was then controlled with  the cautery.   The wound was irrigated.  Subcutaneous tissues were closed with interrupted sutures of 3-0 Vicryl and the skin was closed with running subcuticular suture of 5-0 Vicryl.  Attention was then directed to the left breast.  The surgeon and scrub nurse changed gloves and we used different instruments on the left breast.  The patient had a wire coming through the skin at about 12 oclock position.  An elliptical incision was then made so as to remove the opening in he skin made by the wire.  Using the wire as a guide, a generous amount of breast tissue was removed around the periphery of the wire and extending beyond the wire. The area of concern was 4 cm from the level of the skin.  Bleeding was controlled with the cautery.  The patient also had some of the toothpaste type material and some dilated ducts in this right breast.  The specimen was then given to the radiologist who x-rayed the specimen and said the area of concern had been removed. Bleeding was controlled with the cautery and the wound irrigated.  The wound was then closed with subcuticular sutures of 3-0 Vicryl and the skin was closed with a running subcuticular suture of 5-0 Vicryl.  Benzoin and half-inch Steri-Strips ere used to reinforce the skin closure.  Benzoin and half-inch Steri-Strips were  then applied to the right breast.  A bulky dressing using 4 x 4s, ABD, and 4-inch Hypafix was applied.  The patient was then wrapped with a 6-inch Ace bandage around her entire chest and breast.  The patient seemed to tolerate the procedure well and was taken to the PACU in satisfactory condition. DD:  07/27/99 TD:  07/28/99 Job: [id number]
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
## 2011-01-27 NOTE — H&P (Signed)
[place]  Patient:    [name], [name]                   MRN: [id number] Adm. Date:  [id number] Attending:  Hoyle Sauer CC:         Mikayla Mater. [name], M.D.             [name], M.D.                         History and Physical  CHIEF COMPLAINT: Nausea, vomiting, and abdominal pain for approximately one to two weeks.  HISTORY OF PRESENT ILLNESS: This patient is a 55 year old African-American female, a patient of Dr. Adrian Stevenson at [facility], who has a past medical history significant for adrenal insufficiency, peptic ulcer disease, gastroesophageal reflux disease, hypothyroidism, and questionable depression, who presents to the emergency room with a one to two week history of nausea, vomiting, and abdominal pain that has been smoldering in nature. The patient denies any fever, chills, or diarrhea.  The patient has had small volume stools but no evidence of hematochezia or melena.  The patient states she has been noncompliant with her Proton pump inhibitor secondary to cough but has been taking thyroid replacement hormone and Hydrocortisone as prescribed by Dr. Evlyn Stevenson.  The patient is not orthostatic and denies any orthostatic symptoms, but does admit to significant general malaise secondary to the abdominal pain and nausea and vomiting.  The patient was seen on December 06, 2000 in the emergency room for the above symptoms, after which she was told to follow up with her primary care [name].  Ultrasound of the abdomen at that time revealed mild prominence of the pancreatic duct but was otherwise read out as being normal.  The patient presents again today to the emergency room with continued symptoms similar to the above, unable to keep food down and with increasing malaise.  REVIEW OF SYSTEMS: Negative for any signs or symptoms of upper respiratory tract infection.  Negative for fever or chills, nasal congestion,  ear pain, headaches that are changed from the past, chest pain, shortness of breath, new musculoskeletal deficits, new neurologic deficits, dysuria.  Positive for nausea, vomiting, abdominal discomfort.  Negative for hemoptysis, hematemesis, melena, or hematochezia.  The patient denies cough.  PAST MEDICAL HISTORY:  1. Adrenal insufficiency, status post adrenalectomy approximately sometime in     the early 1990s, etiology unknown.  2. Gastroesophageal reflux disease.  3. History of peptic ulcer disease associated with H. pylori positive,     treated with antibiotics and evaluated by Dr. Ewing Stevenson in the mid to late     1990s.  4. History of recurrent headaches and depression.  5. History of hypothyroidism.  6. 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  7. 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  CURRENT MEDICATIONS:  1. Hydrocortisone 20 mg q.d.  2. Proton pump inhibitor, noncompliant.  3. Synthroid 100 mcg q.d., dose not clearly established.  ALLERGIES: PENICILLIN.  SOCIAL HISTORY: The patient is a widow and has two children, age 74 year and 13 years.  She works as a Engineer, drilling for [employer].  She denies any tobacco or ethanol abuse.  FAMILY HISTORY: Significant for coronary artery disease with coronary artery bypass grafting, hypertension, type 2 diabetes mellitus, and peptic ulcer disease.  Vascular disease reportedly has not occurred prior  to the age of 54.  PHYSICAL EXAMINATION:  GENERAL:  African-American female with generalized malaise, in bed but able to sit and converse, albeit quietly.  VITAL SIGNS: Blood pressure 151/98, temperature 99.7 degrees Fahrenheit, pulse 92 and regular, respirations 16 and unlabored.  HEENT: Head normocephalic, atraumatic.  Sclerae anicteric.  Gaze disconjugate, which is chronic per patient report.  ENT examination shows no sinus tenderness.  No oropharyneal lesions.  NECK: Supple.   No JVD, no cervical lymphadenopathy.  No thyromegaly.  LUNGS: Clear to auscultation bilaterally.  CARDIOVASCULAR: Regular rate and rhythm.  ABDOMEN: Mild diffuse tenderness with no rebound.  Positive bowel sounds.  No hepatosplenomegaly.  Nondistended.  EXTREMITIES: No clubbing, cyanosis, or edema.  Pulses intact in all four extremities.  NEUROLOGIC: Grossly nonfocal.  LABORATORY DATA: WBC 6.1, hemoglobin 14.6, hematocrit 445; platelet count 305,000; neutrophils 37%, lymphocytes 50%.  Urinalysis shows specific gravity of 1.033, otherwise unremarkable except for ketones being slightly positive. Sodium 135, potassium 3.3, chloride 104, carbon dioxide 23, glucose 72, BUN 12, creatinine 0.8.  Calcium 9.3, total protein 7.6, albumin 4.0, AST 22, ALT 14, alkaline phosphatase 38, total bilirubin 1.2.  Amylase 146 (slightly elevated), lipase 15 (slightly low).  Chest x-ray reveals no apparent disease.  KUB shows unremarkable bowel gas pattern.  ASSESSMENT/PLAN: This patient is a 55 year old African-American female with refractory nausea, vomiting, and abdominal pain in the setting of gastroesophageal reflux disease, noncompliant with Proton pump inhibitor, adrenal insufficiency, and history of depression with multiple somatic complaints per the old chart.  1. Nausea, vomiting, abdominal pain.  Will continue Proton pump inhibitor and     obtain serial CBCs and guaiac stools x 3.  Will obtain gastroenterology     consultation with Dr. Ewing Stevenson and/or partners, especially in light of     questionable abnormality of the pancreatic duct on recent abdominal     ultrasound.  Will provide intravenous fluids and antiemetics as     appropriate.  Etiology of this nausea, vomiting, and abdominal pain is     unclear; however, could include pancreatic abnormality, viral syndrome,     and/or stress.  Electrolytes are currently normal.  Low-grade temperature     is concerning and will be monitored.  2.  Hypothyroidism, clinically euthyroid with exception of malaise.  Will     check a TSH in the morning.   3. Adrenal insufficiency.  Blood pressure is adequate; however, given malaise     will boost steroids slightly and check orthostatics. DD:  12/13/00 TD:  12/14/00 Job: [id number]

## 2011-05-29 ENCOUNTER — Emergency Department (HOSPITAL_COMMUNITY)
Admission: EM | Admit: 2011-05-29 | Discharge: 2011-05-29 | Discharge status: Against Medical Advice | Payer: Medicaid Other | Attending: Emergency Medicine | Admitting: Emergency Medicine

## 2011-05-29 DIAGNOSIS — R198 Other specified symptoms and signs involving the digestive system and abdomen: Secondary | ICD-10-CM | POA: Insufficient documentation

## 2011-06-02 ENCOUNTER — Emergency Department (HOSPITAL_COMMUNITY): Payer: Medicaid Other

## 2011-06-02 ENCOUNTER — Encounter (HOSPITAL_COMMUNITY): Payer: Self-pay | Admitting: Radiology

## 2011-06-02 ENCOUNTER — Emergency Department (HOSPITAL_COMMUNITY)
Admission: EM | Admit: 2011-06-02 | Discharge: 2011-06-02 | Disposition: A | Discharge status: Home / Self Care | Payer: Medicaid Other | Attending: Emergency Medicine | Admitting: Emergency Medicine

## 2011-06-02 DIAGNOSIS — Z79899 Other long term (current) drug therapy: Secondary | ICD-10-CM | POA: Insufficient documentation

## 2011-06-02 DIAGNOSIS — R112 Nausea with vomiting, unspecified: Secondary | ICD-10-CM | POA: Insufficient documentation

## 2011-06-02 DIAGNOSIS — R55 Syncope and collapse: Secondary | ICD-10-CM | POA: Insufficient documentation

## 2011-06-02 DIAGNOSIS — F3289 Other specified depressive episodes: Secondary | ICD-10-CM | POA: Insufficient documentation

## 2011-06-02 DIAGNOSIS — E249 Cushing's syndrome, unspecified: Secondary | ICD-10-CM | POA: Insufficient documentation

## 2011-06-02 DIAGNOSIS — F329 Major depressive disorder, single episode, unspecified: Secondary | ICD-10-CM | POA: Insufficient documentation

## 2011-06-02 DIAGNOSIS — E860 Dehydration: Secondary | ICD-10-CM | POA: Insufficient documentation

## 2011-06-02 DIAGNOSIS — E876 Hypokalemia: Secondary | ICD-10-CM | POA: Insufficient documentation

## 2011-06-02 DIAGNOSIS — E039 Hypothyroidism, unspecified: Secondary | ICD-10-CM | POA: Insufficient documentation

## 2011-06-02 DIAGNOSIS — R1013 Epigastric pain: Secondary | ICD-10-CM | POA: Insufficient documentation

## 2011-06-02 DIAGNOSIS — H519 Unspecified disorder of binocular movement: Secondary | ICD-10-CM | POA: Insufficient documentation

## 2011-06-02 DIAGNOSIS — E162 Hypoglycemia, unspecified: Secondary | ICD-10-CM | POA: Insufficient documentation

## 2011-06-02 LAB — DIFFERENTIAL
Basophils Absolute: 0
Basophils Relative: 0
Basophils Relative: 0 % (ref 0–1)
Eosinophils Absolute: 0
Eosinophils Absolute: 0
Eosinophils Relative: 0
Lymphocytes Relative: 17
Lymphs Abs: 0.8
Lymphs Abs: 0.9
Lymphs Abs: 2.3 10*3/uL (ref 0.7–4.0)
Monocytes Absolute: 0.3 10*3/uL (ref 0.1–1.0)
Monocytes Relative: 3
Monocytes Relative: 6 % (ref 3–12)
Neutro Abs: 2.3 10*3/uL (ref 1.7–7.7)
Neutrophils Relative %: 44 % (ref 43–77)
Neutrophils Relative %: 81 — ABNORMAL HIGH
Neutrophils Relative %: 81 — ABNORMAL HIGH

## 2011-06-02 LAB — URINALYSIS, ROUTINE W REFLEX MICROSCOPIC
Bilirubin Urine: NEGATIVE
Glucose, UA: NEGATIVE
Glucose, UA: NEGATIVE mg/dL
Hgb urine dipstick: NEGATIVE
Ketones, ur: 15 — AB
Ketones, ur: NEGATIVE mg/dL
Leukocytes, UA: NEGATIVE
Protein, ur: NEGATIVE mg/dL
pH: 5.5
pH: 6 (ref 5.0–8.0)

## 2011-06-02 LAB — HEPATIC FUNCTION PANEL
ALT: 20
AST: 47 — ABNORMAL HIGH
Albumin: 3.7
Alkaline Phosphatase: 61
Bilirubin, Direct: 0.1
Indirect Bilirubin: 1.1 — ABNORMAL HIGH
Total Bilirubin: 1.2
Total Protein: 8.1

## 2011-06-02 LAB — BASIC METABOLIC PANEL
BUN: 21
BUN: 23
CO2: 18 — ABNORMAL LOW
Chloride: 100
Chloride: 98
Creatinine, Ser: 1.11
Glucose, Bld: 102 — ABNORMAL HIGH
Potassium: 4.3

## 2011-06-02 LAB — CBC
Hemoglobin: 15.3 g/dL — ABNORMAL HIGH (ref 12.0–15.0)
MCH: 31 pg (ref 26.0–34.0)
MCHC: 34.4
MCHC: 34.6
MCHC: 34.9 g/dL (ref 30.0–36.0)
MCV: 88.1
MCV: 89 fL (ref 78.0–100.0)
MCV: 89.4
Platelets: 208
RBC: 4.93 MIL/uL (ref 3.87–5.11)
RDW: 13.5
RDW: 13.8
WBC: 6.4

## 2011-06-02 LAB — LIPASE, BLOOD
Lipase: 14
Lipase: 22 U/L (ref 11–59)

## 2011-06-02 LAB — COMPREHENSIVE METABOLIC PANEL
ALT: 14 U/L (ref 0–35)
ALT: 20
AST: 47 — ABNORMAL HIGH
Alkaline Phosphatase: 65 U/L (ref 39–117)
BUN: 15 mg/dL (ref 6–23)
CO2: 28 mEq/L (ref 19–32)
Calcium: 8.9
Chloride: 98 mEq/L (ref 96–112)
Creatinine, Ser: 1.15
GFR calc Af Amer: >60
GFR calc Af Amer: >60 mL/min (ref >60)
GFR calc non Af Amer: >60 mL/min (ref >60)
Glucose, Bld: 102 mg/dL — ABNORMAL HIGH (ref 70–99)
Potassium: 5.3 mEq/L — ABNORMAL HIGH (ref 3.5–5.1)
Sodium: 134 — ABNORMAL LOW
Sodium: 136 mEq/L (ref 135–145)
Total Bilirubin: 0.3 mg/dL (ref 0.3–1.2)
Total Protein: 7.1
Total Protein: 8.1 g/dL (ref 6.0–8.3)

## 2011-06-02 LAB — GLUCOSE, CAPILLARY: Glucose-Capillary: 77 mg/dL (ref 70–99)

## 2011-06-02 LAB — URINE MICROSCOPIC-ADD ON

## 2011-06-02 LAB — MAGNESIUM: Magnesium: 1.9

## 2011-06-02 LAB — PHOSPHORUS: Phosphorus: 2.7

## 2011-06-02 MED ORDER — IOHEXOL 300 MG/ML  SOLN
80.0000 mL | Freq: Once | INTRAMUSCULAR | Status: AC | PRN
  Administered 2011-06-02: 80 mL via INTRAVENOUS

## 2011-06-19 ENCOUNTER — Emergency Department (HOSPITAL_COMMUNITY)
Admission: EM | Admit: 2011-06-19 | Discharge: 2011-06-20 | Disposition: A | Discharge status: Home / Self Care | Payer: Medicaid Other | Attending: Emergency Medicine | Admitting: Emergency Medicine

## 2011-06-19 DIAGNOSIS — F329 Major depressive disorder, single episode, unspecified: Secondary | ICD-10-CM | POA: Insufficient documentation

## 2011-06-19 DIAGNOSIS — I498 Other specified cardiac arrhythmias: Secondary | ICD-10-CM | POA: Insufficient documentation

## 2011-06-19 DIAGNOSIS — F3289 Other specified depressive episodes: Secondary | ICD-10-CM | POA: Insufficient documentation

## 2011-06-19 DIAGNOSIS — G4700 Insomnia, unspecified: Secondary | ICD-10-CM | POA: Insufficient documentation

## 2011-06-19 DIAGNOSIS — Z79899 Other long term (current) drug therapy: Secondary | ICD-10-CM | POA: Insufficient documentation

## 2011-06-19 LAB — DIFFERENTIAL
Basophils Absolute: 0 10*3/uL (ref 0.0–0.1)
Basophils Relative: 0 % (ref 0–1)
Lymphocytes Relative: 54 % — ABNORMAL HIGH (ref 12–46)
Monocytes Relative: 10 % (ref 3–12)
Neutro Abs: 2.1 10*3/uL (ref 1.7–7.7)
Neutrophils Relative %: 27 % — ABNORMAL LOW (ref 43–77)

## 2011-06-19 LAB — URINALYSIS, ROUTINE W REFLEX MICROSCOPIC
Glucose, UA: NEGATIVE mg/dL
Hgb urine dipstick: NEGATIVE
Leukocytes, UA: NEGATIVE
pH: 5.5 (ref 5.0–8.0)

## 2011-06-19 LAB — COMPREHENSIVE METABOLIC PANEL
BUN: 12 mg/dL (ref 6–23)
CO2: 25 mEq/L (ref 19–32)
Chloride: 98 mEq/L (ref 96–112)
Creatinine, Ser: 0.89 mg/dL (ref 0.50–1.10)
GFR calc non Af Amer: 73 mL/min — ABNORMAL LOW (ref >90)
Glucose, Bld: 99 mg/dL (ref 70–99)
Total Bilirubin: 0.4 mg/dL (ref 0.3–1.2)

## 2011-06-19 LAB — CBC
HCT: 42.5 % (ref 36.0–46.0)
Hemoglobin: 14.6 g/dL (ref 12.0–15.0)
MCH: 30.3 pg (ref 26.0–34.0)
RBC: 4.82 MIL/uL (ref 3.87–5.11)

## 2011-06-19 LAB — RAPID URINE DRUG SCREEN, HOSP PERFORMED
Cocaine: NOT DETECTED
Opiates: NOT DETECTED

## 2011-06-29 LAB — COMPREHENSIVE METABOLIC PANEL
Alkaline Phosphatase: 53
BUN: 25 — ABNORMAL HIGH
Calcium: 10
GFR calc non Af Amer: 51 — ABNORMAL LOW
Glucose, Bld: 91
Potassium: 5.1
Total Protein: 7.8

## 2011-06-29 LAB — DIFFERENTIAL
Basophils Absolute: 0
Basophils Relative: 0
Monocytes Relative: 7
Neutro Abs: 1.8
Neutrophils Relative %: 24 — ABNORMAL LOW

## 2011-06-29 LAB — URINALYSIS, ROUTINE W REFLEX MICROSCOPIC
Glucose, UA: NEGATIVE
Nitrite: NEGATIVE
Protein, ur: NEGATIVE
Urobilinogen, UA: 0.2

## 2011-06-29 LAB — CBC
HCT: 46.1 — ABNORMAL HIGH
Hemoglobin: 15.5 — ABNORMAL HIGH
MCHC: 33.6
RDW: 14

## 2011-06-29 LAB — LIPASE, BLOOD: Lipase: 17

## 2011-08-02 ENCOUNTER — Encounter (HOSPITAL_COMMUNITY): Payer: Self-pay | Admitting: *Deleted

## 2011-08-02 ENCOUNTER — Inpatient Hospital Stay (HOSPITAL_COMMUNITY)
Admission: EM | Admit: 2011-08-02 | Discharge: 2011-08-06 | DRG: 644 | Disposition: A | Discharge status: Home / Self Care | Payer: Medicaid Other | Source: Ambulatory Visit | Attending: Endocrinology | Admitting: Endocrinology

## 2011-08-02 ENCOUNTER — Emergency Department (HOSPITAL_COMMUNITY): Payer: Medicaid Other

## 2011-08-02 ENCOUNTER — Other Ambulatory Visit: Payer: Self-pay

## 2011-08-02 DIAGNOSIS — G43909 Migraine, unspecified, not intractable, without status migrainosus: Secondary | ICD-10-CM | POA: Diagnosis present

## 2011-08-02 DIAGNOSIS — K219 Gastro-esophageal reflux disease without esophagitis: Secondary | ICD-10-CM | POA: Diagnosis present

## 2011-08-02 DIAGNOSIS — E871 Hypo-osmolality and hyponatremia: Secondary | ICD-10-CM | POA: Diagnosis present

## 2011-08-02 DIAGNOSIS — E2749 Other adrenocortical insufficiency: Principal | ICD-10-CM | POA: Diagnosis present

## 2011-08-02 DIAGNOSIS — F3289 Other specified depressive episodes: Secondary | ICD-10-CM | POA: Diagnosis present

## 2011-08-02 DIAGNOSIS — K649 Unspecified hemorrhoids: Secondary | ICD-10-CM | POA: Diagnosis present

## 2011-08-02 DIAGNOSIS — J069 Acute upper respiratory infection, unspecified: Secondary | ICD-10-CM | POA: Diagnosis present

## 2011-08-02 DIAGNOSIS — R112 Nausea with vomiting, unspecified: Secondary | ICD-10-CM | POA: Diagnosis present

## 2011-08-02 DIAGNOSIS — J45909 Unspecified asthma, uncomplicated: Secondary | ICD-10-CM | POA: Diagnosis present

## 2011-08-02 DIAGNOSIS — Z8711 Personal history of peptic ulcer disease: Secondary | ICD-10-CM

## 2011-08-02 DIAGNOSIS — E2740 Unspecified adrenocortical insufficiency: Secondary | ICD-10-CM | POA: Diagnosis present

## 2011-08-02 DIAGNOSIS — H5440 Blindness, one eye, unspecified eye: Secondary | ICD-10-CM | POA: Diagnosis present

## 2011-08-02 DIAGNOSIS — E039 Hypothyroidism, unspecified: Secondary | ICD-10-CM | POA: Diagnosis present

## 2011-08-02 DIAGNOSIS — M129 Arthropathy, unspecified: Secondary | ICD-10-CM | POA: Diagnosis present

## 2011-08-02 DIAGNOSIS — I951 Orthostatic hypotension: Secondary | ICD-10-CM | POA: Diagnosis present

## 2011-08-02 DIAGNOSIS — H539 Unspecified visual disturbance: Secondary | ICD-10-CM

## 2011-08-02 DIAGNOSIS — F32A Depression, unspecified: Secondary | ICD-10-CM | POA: Diagnosis present

## 2011-08-02 DIAGNOSIS — E272 Addisonian crisis: Secondary | ICD-10-CM

## 2011-08-02 DIAGNOSIS — I69998 Other sequelae following unspecified cerebrovascular disease: Secondary | ICD-10-CM

## 2011-08-02 DIAGNOSIS — F329 Major depressive disorder, single episode, unspecified: Secondary | ICD-10-CM | POA: Diagnosis present

## 2011-08-02 DIAGNOSIS — I959 Hypotension, unspecified: Secondary | ICD-10-CM | POA: Diagnosis present

## 2011-08-02 DIAGNOSIS — E079 Disorder of thyroid, unspecified: Secondary | ICD-10-CM | POA: Diagnosis present

## 2011-08-02 HISTORY — DX: Reserved for inherently not codable concepts without codable children: IMO0001

## 2011-08-02 HISTORY — DX: Disorder of thyroid, unspecified: E07.9

## 2011-08-02 HISTORY — DX: Hyperlipidemia, unspecified: E78.5

## 2011-08-02 HISTORY — DX: Blindness, one eye, unspecified eye: H54.40

## 2011-08-02 HISTORY — DX: Headache: R51

## 2011-08-02 HISTORY — DX: Gastro-esophageal reflux disease without esophagitis: K21.9

## 2011-08-02 HISTORY — DX: Gastric ulcer, unspecified as acute or chronic, without hemorrhage or perforation: K25.9

## 2011-08-02 HISTORY — DX: Cerebral infarction, unspecified: I63.9

## 2011-08-02 HISTORY — DX: Cushing's syndrome, unspecified: E24.9

## 2011-08-02 HISTORY — DX: Headache, unspecified: R51.9

## 2011-08-02 HISTORY — DX: Unspecified osteoarthritis, unspecified site: M19.90

## 2011-08-02 HISTORY — DX: Anemia, unspecified: D64.9

## 2011-08-02 HISTORY — DX: Depression, unspecified: F32.A

## 2011-08-02 HISTORY — DX: Major depressive disorder, single episode, unspecified: F32.9

## 2011-08-02 HISTORY — DX: Encounter for other specified aftercare: Z51.89

## 2011-08-02 LAB — CBC
HCT: 40.4 % (ref 36.0–46.0)
Hemoglobin: 13.7 g/dL (ref 12.0–15.0)
MCH: 30.2 pg (ref 26.0–34.0)
MCV: 89 fL (ref 78.0–100.0)
Platelets: 240 10*3/uL (ref 150–400)
RBC: 4.8 MIL/uL (ref 3.87–5.11)
RBC: 4.54 MIL/uL (ref 3.87–5.11)
RDW: 13.2 % (ref 11.5–15.5)
WBC: 4.6 10*3/uL (ref 4.0–10.5)

## 2011-08-02 LAB — DIFFERENTIAL
Basophils Absolute: 0 10*3/uL (ref 0.0–0.1)
Lymphocytes Relative: 27 % (ref 12–46)
Lymphs Abs: 1.2 10*3/uL (ref 0.7–4.0)
Neutrophils Relative %: 53 % (ref 43–77)

## 2011-08-02 LAB — URINALYSIS, ROUTINE W REFLEX MICROSCOPIC
Bilirubin Urine: NEGATIVE
Glucose, UA: NEGATIVE mg/dL
Hgb urine dipstick: NEGATIVE
Specific Gravity, Urine: 1.016 (ref 1.005–1.030)
Urobilinogen, UA: 1 mg/dL (ref 0.0–1.0)

## 2011-08-02 LAB — URINE MICROSCOPIC-ADD ON

## 2011-08-02 LAB — COMPREHENSIVE METABOLIC PANEL
ALT: 9 U/L (ref 0–35)
AST: 21 U/L (ref 0–37)
Alkaline Phosphatase: 65 U/L (ref 39–117)
CO2: 24 mEq/L (ref 19–32)
Calcium: 10.1 mg/dL (ref 8.4–10.5)
GFR calc Af Amer: 67 mL/min — ABNORMAL LOW (ref >90)
GFR calc non Af Amer: 58 mL/min — ABNORMAL LOW (ref >90)
Glucose, Bld: 87 mg/dL (ref 70–99)
Potassium: 4.1 mEq/L (ref 3.5–5.1)
Sodium: 131 mEq/L — ABNORMAL LOW (ref 135–145)
Total Protein: 7.8 g/dL (ref 6.0–8.3)

## 2011-08-02 LAB — TROPONIN I: Troponin I: <0.3 ng/mL (ref <0.30)

## 2011-08-02 MED ORDER — ACETAMINOPHEN 325 MG PO TABS
650.0000 mg | ORAL_TABLET | Freq: Once | ORAL | Status: DC
  Administered 2011-08-02: 650 mg via ORAL

## 2011-08-02 MED ORDER — POTASSIUM CHLORIDE IN NACL 20-0.9 MEQ/L-% IV SOLN
INTRAVENOUS | Status: DC
  Administered 2011-08-02 – 2011-08-05 (×7): via INTRAVENOUS
  Filled 2011-08-02 (×10): qty 1000

## 2011-08-02 MED ORDER — ACETAMINOPHEN 650 MG RE SUPP: 650.0000 mg | Freq: Four times a day (QID) | RECTAL | Status: DC | PRN

## 2011-08-02 MED ORDER — ACETAMINOPHEN 325 MG PO TABS
650.0000 mg | ORAL_TABLET | Freq: Four times a day (QID) | ORAL | Status: DC | PRN
  Administered 2011-08-03 – 2011-08-05 (×3): 650 mg via ORAL
  Filled 2011-08-02 (×4): qty 2

## 2011-08-02 MED ORDER — PROMETHAZINE HCL 25 MG/ML IJ SOLN
12.5000 mg | Freq: Four times a day (QID) | INTRAMUSCULAR | Status: DC | PRN
  Filled 2011-08-02: qty 1

## 2011-08-02 MED ORDER — IOHEXOL 300 MG/ML  SOLN
80.0000 mL | Freq: Once | INTRAMUSCULAR | Status: AC | PRN
  Administered 2011-08-02: 80 mL via INTRAVENOUS

## 2011-08-02 MED ORDER — DOCUSATE SODIUM 100 MG PO CAPS
100.0000 mg | ORAL_CAPSULE | Freq: Every day | ORAL | Status: DC
  Administered 2011-08-02 – 2011-08-06 (×5): 100 mg via ORAL
  Filled 2011-08-02 (×5): qty 1

## 2011-08-02 MED ORDER — ZOLPIDEM TARTRATE 5 MG PO TABS
5.0000 mg | ORAL_TABLET | Freq: Every evening | ORAL | Status: DC | PRN
  Administered 2011-08-02 – 2011-08-04 (×3): 5 mg via ORAL
  Filled 2011-08-02 (×3): qty 1

## 2011-08-02 MED ORDER — SODIUM CHLORIDE 0.9 % IV BOLUS (SEPSIS)
1000.0000 mL | Freq: Once | INTRAVENOUS | Status: AC
  Administered 2011-08-02: 1000 mL via INTRAVENOUS

## 2011-08-02 MED ORDER — ONDANSETRON HCL 4 MG/2ML IJ SOLN
4.0000 mg | Freq: Once | INTRAMUSCULAR | Status: AC
  Administered 2011-08-02: 4 mg via INTRAVENOUS
  Filled 2011-08-02: qty 2

## 2011-08-02 MED ORDER — HYDROCORTISONE SOD SUCCINATE 100 MG IJ SOLR
100.0000 mg | Freq: Three times a day (TID) | INTRAMUSCULAR | Status: DC
  Administered 2011-08-02 – 2011-08-04 (×5): 100 mg via INTRAVENOUS
  Filled 2011-08-02 (×9): qty 2

## 2011-08-02 MED ORDER — INFLUENZA VIRUS VACC SPLIT PF IM SUSP
0.5000 mL | INTRAMUSCULAR | Status: AC
  Administered 2011-08-03: 0.5 mL via INTRAMUSCULAR
  Filled 2011-08-02: qty 0.5

## 2011-08-02 MED ORDER — MORPHINE SULFATE 4 MG/ML IJ SOLN
4.0000 mg | Freq: Once | INTRAMUSCULAR | Status: AC
  Administered 2011-08-02: 4 mg via INTRAVENOUS
  Filled 2011-08-02: qty 1

## 2011-08-02 MED ORDER — VENLAFAXINE HCL ER 150 MG PO CP24
150.0000 mg | ORAL_CAPSULE | Freq: Every day | ORAL | Status: DC
  Administered 2011-08-03 – 2011-08-06 (×4): 150 mg via ORAL
  Filled 2011-08-02 (×4): qty 1

## 2011-08-02 MED ORDER — HYDROCORTISONE SOD SUCCINATE 100 MG IJ SOLR
100.0000 mg | Freq: Once | INTRAMUSCULAR | Status: AC
  Administered 2011-08-02: 100 mg via INTRAVENOUS
  Filled 2011-08-02: qty 2

## 2011-08-02 MED ORDER — SODIUM CHLORIDE 0.9 % IV BOLUS (SEPSIS): 1000.0000 mL | Freq: Once | INTRAVENOUS | Status: DC

## 2011-08-02 MED ORDER — ACETAMINOPHEN 325 MG PO TABS
ORAL_TABLET | ORAL | Status: AC
  Filled 2011-08-02: qty 2

## 2011-08-02 MED ORDER — PANTOPRAZOLE SODIUM 40 MG PO TBEC
40.0000 mg | DELAYED_RELEASE_TABLET | Freq: Every day | ORAL | Status: DC
  Administered 2011-08-02 – 2011-08-06 (×5): 40 mg via ORAL
  Filled 2011-08-02 (×5): qty 1

## 2011-08-02 MED ORDER — PROMETHAZINE HCL 25 MG PO TABS: 12.5000 mg | ORAL_TABLET | Freq: Four times a day (QID) | ORAL | Status: DC | PRN

## 2011-08-02 MED ORDER — LEVOTHYROXINE SODIUM 75 MCG PO TABS
75.0000 ug | ORAL_TABLET | Freq: Every day | ORAL | Status: DC
  Administered 2011-08-03: 75 ug via ORAL
  Filled 2011-08-02 (×4): qty 1

## 2011-08-02 MED ORDER — ENOXAPARIN SODIUM 40 MG/0.4ML ~~LOC~~ SOLN
40.0000 mg | SUBCUTANEOUS | Status: DC
  Administered 2011-08-02 – 2011-08-05 (×4): 40 mg via SUBCUTANEOUS
  Filled 2011-08-02 (×5): qty 0.4

## 2011-08-02 NOTE — ED Notes (Signed)
Pt placed in gown and mask placed on patient for precautions of high temp

## 2011-08-02 NOTE — ED Notes (Signed)
Pt with left upper quad tenderness and vomiting with fever.  Lungs diminished at bases.  Pt has been given Tylenol 650mg  po per protocol but could not chart it

## 2011-08-02 NOTE — ED Provider Notes (Signed)
History     CSN: [id number] Arrival date & time: 08/02/2011 12:43 PM   First MD Initiated Contact with Patient 08/02/11 1252      Chief Complaint  Patient presents with  . 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 She arrives with nausea, vomiting for the past 2 days. Ports that she's vomited more than 20 times today it is nonbilious and nonbloody. She denies any diarrhea. She denies fever at home but is febrile here to 102. She also complains of diffuse body aches, chills and gradual onset headache that is similar to her previous migraines. She denies any chest pain or shortness of breath. Just diffuse abdominal pain that she believes secondary to vomiting. She also complains of a cough as been productive of white sputum. She is taking her steroids as prescribed.  The history is provided by the patient.    Past Medical History  Diagnosis Date  . Thyroid disease   . Reflux   . 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 Blindness of left eye 1978  . Hyperlipemia   . Asthma   . Migraine   . Sinus headache   . Cushing's syndrome   . Blood transfusion   . Anemia   . Constipation   . Hemorrhoids   . Depression   . 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 Stomach ulcer     "from the hydrocortisone"    Past Surgical History  Procedure Date  . Adrenal gland surgery 1982    on hydrocortisone  . Atrial myxoma excision 1978  . Vaginal hysterectomy 2000    "partial; w/left ovary"  . Breast biopsy 07/1999    ductal tissue excision & bx right; bx only left breast  . Rotator cuff repair 05/2005    left  . Eye surgery   . Repaired cross eyed     "as a child"    History reviewed. No pertinent family history.  History  Substance Use Topics  . Smoking status: Never Smoker   . Smokeless tobacco:  Never Used  . Alcohol Use: No    OB History    Grav Para Term Preterm Abortions TAB SAB Ect Mult Living                  Review of Systems  Constitutional: Positive for fever, chills, activity change, appetite change and fatigue.  HENT: Negative for sore throat and mouth sores.   Eyes: Negative for visual disturbance.  Respiratory: Positive for cough. Negative for shortness of breath.   Cardiovascular: Negative for chest pain.  Gastrointestinal: Positive for nausea, vomiting and abdominal pain. Negative for diarrhea.  Genitourinary: Negative for dysuria and hematuria.  Musculoskeletal: Positive for joint swelling and arthralgias.  Neurological: Positive for headaches. Negative for weakness and numbness.  Hematological: Negative for adenopathy.    Allergies  Ibuprofen and Penicillins  Home Medications   No current outpatient prescriptions on file.  BP 106/75  Pulse 100  Temp(Src) 98.3 F (36.8 C) (Oral)  Resp 18  Ht 5' (1.524 m)  Wt 174 lb 6.1 oz (79.1 kg)  BMI 34.06 kg/m2  SpO2 95%  Physical Exam  Constitutional: She is oriented to person, place, and time. She appears well-developed and well-nourished. No distress.  HENT:  Head: Normocephalic and atraumatic.  Mouth/Throat: Oropharynx is clear and moist.  No oropharyngeal exudate.       Dry mucous membranes  Eyes: Conjunctivae are normal. Pupils are equal, round, and reactive to light.  Neck: Normal range of motion. Neck supple.       No meningismus  Cardiovascular: Normal rate, regular rhythm and normal heart sounds.   Pulmonary/Chest: Effort normal and breath sounds normal. No respiratory distress.  Abdominal: Soft. There is tenderness. There is no rebound and no guarding.       Mild diffuse tenderness  Musculoskeletal: Normal range of motion. She exhibits no edema and no tenderness.  Neurological: She is alert and oriented to person, place, and time. No cranial nerve deficit.  Skin: Skin is warm.    ED  Course  Procedures (including critical care time)  Labs Reviewed  DIFFERENTIAL - Abnormal; Notable for the following:    Monocytes Relative 13 (*)    Eosinophils Relative 7 (*)    All other components within normal limits  COMPREHENSIVE METABOLIC PANEL - Abnormal; Notable for the following:    Sodium 131 (*)    GFR calc non Af Amer 58 (*)    GFR calc Af Amer 67 (*)    All other components within normal limits  URINALYSIS, ROUTINE W REFLEX MICROSCOPIC - Abnormal; Notable for the following:    Leukocytes, UA TRACE (*)    All other components within normal limits  URINE MICROSCOPIC-ADD ON - Abnormal; Notable for the following:    Squamous Epithelial / LPF MANY (*)    All other components within normal limits  CBC  LACTIC ACID, PLASMA  LIPASE, BLOOD  TROPONIN I  CULTURE, BLOOD (ROUTINE X 2)  CULTURE, BLOOD (ROUTINE X 2)  URINE CULTURE  COMPREHENSIVE METABOLIC PANEL  CBC  CBC   Dg Chest 2 View  08/02/2011  *RADIOLOGY REPORT*  Clinical Data: Medial chest pain x 2 days  CHEST - 2 VIEW  Comparison: 06/02/2011  Findings: Lungs are essentially clear. No pleural effusion or pneumothorax.  Cardiomediastinal silhouette is within normal limits.  Sternotomy wires.  Epicardial pacing wires.  Degenerative changes of the visualized thoracolumbar spine.  Surgical clips in the upper abdomen.  IMPRESSION: No evidence of acute cardiopulmonary disease.  Original Report Authenticated By: Charline Bills, M.D.   Ct Head Wo Contrast  08/02/2011  *RADIOLOGY REPORT*  Clinical Data: Headache, abdominal pain, nausea and vomiting  CT HEAD WITHOUT CONTRAST  Technique:  Contiguous axial images were obtained from the base of the skull through the vertex without contrast.  Comparison: 10/15/2006  Findings: No skull fracture is noted.  There is mucosal thickening bilateral maxillary sinus.  Mucosal thickening noted bilateral ethmoid air cells.  Mild mucosal thickening right sphenoid sinus. Frontal sinuses are  hypoplastic.  The mastoid air cells are unremarkable.  No intracranial hemorrhage, mass effect or midline shift.  No acute infarction.  No mass lesion is noted on this unenhanced scan.  No hydrocephalus.  No intra or extra-axial fluid collection.  IMPRESSION: No acute intracranial abnormality.  Sinuses disease as described above.  Original Report Authenticated By: Natasha Mead, M.D.   Ct Abdomen Pelvis W Contrast  08/02/2011  *RADIOLOGY REPORT*  Clinical Data: Abdominal pain.  Nausea and vomiting.  Headache. Surgical history includes bilateral adrenalectomy.  CT ABDOMEN AND PELVIS WITH CONTRAST 08/02/2011:  Technique:  Multidetector CT imaging of the abdomen and pelvis was performed following the standard protocol during bolus administration of intravenous contrast.  Contrast: 80mL OMNIPAQUE IOHEXOL 300 MG/ML IV. Oral contrast not administered due to emesis.  Comparison: CT abdomen and pelvis 06/02/2011.  Findings: Cysts in the liver near the dome and in the posterior segment right lobe, unchanged; no significant focal hepatic parenchymal abnormality.  Normal-appearing spleen and pancreas. Adrenal glands surgically absent.  Scarring involving the upper pole of the left kidney, and cortical cysts involving both kidneys; kidneys otherwise unremarkable and unchanged. Gallbladder borderline distended but without calcified gallstones or CT evidence for cholecystitis.  No biliary ductal dilation.  No visible aorto-iliofemoral atherosclerosis.  No significant lymphadenopathy.  Stomach decompressed and unremarkable.  Normal-appearing small bowel.  Descending and sigmoid colon diverticulosis without evidence of acute diverticulitis.  Remainder of the colon unremarkable.  Normal appendix in the right upper pelvis.  No ascites.  Atrophic left rectus muscle as noted previously.  Very small umbilical hernia containing fat.  Uterus not visualized and presumed surgically absent.  Normal- appearing right ovary by CT.  No visible  left ovary, also presumed surgically absent.  No adnexal masses or free pelvic fluid. Urinary bladder decompressed and unremarkable.  Bone window images demonstrate thoracolumbar scoliosis convex right, degenerative changes involving the lower thoracic and lumbar spine, and degenerative changes in the right sacral iliac joint; numerous sclerotic foci throughout the visualized skeleton.  Visualized lung bases clear apart from the expected dependent atelectasis posteriorly.  Heart mildly enlarged.  Epicardial pacing wires again noted.  IMPRESSION:  1.  No acute abnormalities involving the abdomen or pelvis. 2.  Sclerotic foci throughout the visualized spine, pelvis, and proximal femora, unchanged from the prior examination.  In the absence of a history of breast cancer, this may be due to a metabolic bone disorder.  Clinical correlation. 3.  Scarring involving the upper pole of the left kidney. 4.  Descending and sigmoid colon diverticulosis.  Original Report Authenticated By: Arnell Sieving, M.D.     1. Nausea & vomiting   2. Orthostatic hypotension   3. Adrenal crisis       MDM  Nausea, vomiting, fever, bodyaches and headache in setting of adrenal insufficiency. We'll check labs, provide IV fluids antiemetics, provide stress dose steroids.  Upon return from Xray, patient was very uncomfortable and diaphoretic.  Given additional fluids and antipyretics.  Nausea and vomiting continues.  With continued vomiting, abdominal pain and chronic immunosuppression, will check CT abdomen.  Admission discussed with Dr. Eloise Harman for additional fluids, antiemetics, and steroids.  Presentation seems consistent with viral illness.   Date: 08/02/2011  Rate: 120  Rhythm: sinus tachycardia  QRS Axis: normal  Intervals: normal  ST/T Wave abnormalities: nonspecific ST/T changes  Conduction Disutrbances:none  Narrative Interpretation: frequent PVCs  Old EKG Reviewed: unchanged      Glynn Octave,  MD 08/02/11 2011

## 2011-08-02 NOTE — ED Notes (Signed)
Pt arrived with not feeling well since yesterday and pt is warm to touch.

## 2011-08-02 NOTE — ED Notes (Signed)
Pt is here with flu-like symptoms that started yesterday and vomiting reported.  EMS reports patient slobbers.  Pt reports feeling cold.  Pt is alert

## 2011-08-02 NOTE — H&P (Deleted)
[name] is an 55 y.o. female.   PCP:   No primary provider on file.   Chief Complaint:  Nausea and vomiting  HPI: See other history and physical from 08/02/11  Past Medical History  Diagnosis Date  . Thyroid disease   . Reflux   . 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 Blindness of left eye 1978  . Hyperlipemia   . Asthma   . Migraine   . Sinus headache   . Cushing's syndrome   . Blood transfusion   . Anemia   . Constipation   . Hemorrhoids   . Depression   . 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 Stomach ulcer     "from the hydrocortisone"     Past Surgical History  Procedure Date  . Adrenal gland surgery 1982    on hydrocortisone  . Atrial myxoma excision 1978  . Vaginal hysterectomy 2000    "partial; w/left ovary"  . Breast biopsy 07/1999    ductal tissue excision & bx right; bx only left breast  . Rotator cuff repair 05/2005    left  . Eye surgery   . Repaired cross eyed     "as a child"     History reviewed. No pertinent family history.  Social History:  reports that she has never smoked. She has never used smokeless tobacco. She reports that she does not drink alcohol or use illicit drugs.  Allergies:  Allergies  Allergen Reactions  . Ibuprofen (Advil) Nausea And Vomiting  . Penicillins Nausea And Vomiting    Medications: Prior to Admission medications   Medication Sig Start Date End Date Taking? Authorizing Provider  docusate sodium (COLACE) 100 MG capsule Take 100 mg by mouth daily.     Yes Historical Provider, MD  HYDROCORTISONE PO Take 20 mg by mouth daily.    Yes Historical Provider, MD  Levothyroxine Sodium (LEVOXYL PO) Take 75 mcg by mouth daily.    Yes Historical Provider, MD  OMEPRAZOLE PO Take 20 mg by mouth 2 (two) times daily.    Yes Historical Provider, MD  venlafaxine (EFFEXOR-XR) 150 MG 24 hr capsule Take 150 mg by mouth daily.     Yes Historical Provider, MD  ZOLPIDEM TARTRATE PO  Take 10 mg by mouth.    Yes Historical Provider, MD    Medications Prior to Admission  Medication Dose Route Frequency Provider Last Rate Last Dose  . 0.9 % NaCl with KCl 20 mEq/ L  infusion   Intravenous Continuous Garlan Fillers, MD      . acetaminophen (TYLENOL) 325 MG tablet           . acetaminophen (TYLENOL) tablet 650 mg  650 mg Oral Q6H PRN Garlan Fillers, MD       Or  . acetaminophen (TYLENOL) suppository 650 mg  650 mg Rectal Q6H PRN Garlan Fillers, MD      . docusate sodium (COLACE) capsule 100 mg  100 mg Oral Daily Garlan Fillers, MD      . enoxaparin (LOVENOX) injection 40 mg  40 mg Subcutaneous Q24H Garlan Fillers, MD      . hydrocortisone sodium succinate (SOLU-CORTEF) injection 100 mg  100 mg Intravenous Once Glynn Octave, MD   100 mg at 08/02/11 1404  . influenza  inactive virus vaccine (FLUZONE/FLUARIX) injection 0.5 mL  0.5 mL Intramuscular Tomorrow-1000 Glynn Octave,  MD      . iohexol (OMNIPAQUE) 300 MG/ML injection 80 mL  80 mL Intravenous Once PRN Medication Radiologist   80 mL at 08/02/11 1712  . iohexol (OMNIPAQUE) 300 MG/ML injection 80 mL  80 mL Intravenous Once PRN Medication Radiologist   80 mL at 08/02/11 1720  . levothyroxine (SYNTHROID, LEVOTHROID) tablet 75 mcg  75 mcg Oral QAC breakfast Garlan Fillers, MD      . morphine 4 MG/ML injection 4 mg  4 mg Intravenous Once Glynn Octave, MD   4 mg at 08/02/11 1403  . ondansetron (ZOFRAN) injection 4 mg  4 mg Intravenous Once Glynn Octave, MD   4 mg at 08/02/11 1402  . pantoprazole (PROTONIX) EC tablet 40 mg  40 mg Oral Q1200 Garlan Fillers, MD      . promethazine (PHENERGAN) tablet 12.5 mg  12.5 mg Oral Q6H PRN Garlan Fillers, MD       Or  . promethazine (PHENERGAN) injection 12.5 mg  12.5 mg Intravenous Q6H PRN Garlan Fillers, MD      . sodium chloride 0.9 % bolus 1,000 mL  1,000 mL Intravenous Once Glynn Octave, MD   1,000 mL at 08/02/11 1402  . sodium chloride 0.9 % bolus  1,000 mL  1,000 mL Intravenous Once Glynn Octave, MD      . sodium chloride 0.9 % bolus 1,000 mL  1,000 mL Intravenous Once Glynn Octave, MD      . venlafaxine (EFFEXOR-XR) 24 hr capsule 150 mg  150 mg Oral Daily Garlan Fillers, MD      . DISCONTD: acetaminophen (TYLENOL) tablet 650 mg  650 mg Oral Once Hurman Horn, MD   650 mg at 08/02/11 1300   No current outpatient prescriptions on file as of 08/02/2011.     Review of Systems:   The patient denies anorexia, fever, weight loss,, vision loss, decreased hearing, hoarseness, chest pain, syncope, dyspnea on exertion, peripheral edema, balance deficits, hemoptysis, abdominal pain, melena, hematochezia, severe indigestion/heartburn, hematuria, incontinence, genital sores, muscle weakness, suspicious skin lesions, transient blindness, difficulty walking, depression, unusual weight change, abnormal bleeding, enlarged lymph nodes, angioedema, and breast masses.   Physical Exam: Blood pressure 106/75, pulse 100, temperature 98.3 F (36.8 C), temperature source Oral, resp. rate 18, height 5' (1.524 m), weight 79.1 kg (174 lb 6.1 oz), SpO2 95.00%.  Filed Vitals:   08/02/11 1555 08/02/11 1556 08/02/11 1748 08/02/11 1913  BP: 105/59 80/57 97/62  106/75  Pulse: 129 147 96 100  Temp:   99.1 F (37.3 C) 98.3 F (36.8 C)  TempSrc:   Oral Oral  Resp:   18 18  Height:    5' (1.524 m)  Weight:    79.1 kg (174 lb 6.1 oz)  SpO2:   96% 95%      Labs on Admission:   Basename 08/02/11 1340  NA 131*  K 4.1  CL 97  CO2 24  GLUCOSE 87  BUN 13  CREATININE 1.08  CALCIUM 10.1  MG --  PHOS --    Basename 08/02/11 1340  AST 21  ALT 9  ALKPHOS 65  BILITOT 0.3  PROT 7.8  ALBUMIN 3.8    Basename 08/02/11 1340  LIPASE 23  AMYLASE --    Basename 08/02/11 1340  WBC 4.6  NEUTROABS 2.4  HGB 14.5  HCT 42.4  MCV 88.3  PLT 240    Basename 08/02/11 1546  CKTOTAL --  CKMB --  CKMBINDEX --  TROPONINI <0.30   No results found  for this basename: TSH,T4TOTAL,FREET3,T3FREE,THYROIDAB in the last 72 hours No results found for this basename: VITAMINB12:2,FOLATE:2,FERRITIN:2,TIBC:2,IRON:2,RETICCTPCT:2 in the last 72 hours   LAB RESULT POCT:  Results for orders placed during the hospital encounter of 08/02/11  CBC      Component Value Range   WBC 4.6  4.0 - 10.5 (K/uL)   RBC 4.80  3.87 - 5.11 (MIL/uL)   Hemoglobin 14.5  12.0 - 15.0 (g/dL)   HCT 16.1  09.6 - 04.5 (%)   MCV 88.3  78.0 - 100.0 (fL)   MCH 30.2  26.0 - 34.0 (pg)   MCHC 34.2  30.0 - 36.0 (g/dL)   RDW 40.9  81.1 - 91.4 (%)   Platelets 240  150 - 400 (K/uL)  DIFFERENTIAL      Component Value Range   Neutrophils Relative 53  43 - 77 (%)   Neutro Abs 2.4  1.7 - 7.7 (K/uL)   Lymphocytes Relative 27  12 - 46 (%)   Lymphs Abs 1.2  0.7 - 4.0 (K/uL)   Monocytes Relative 13 (*) 3 - 12 (%)   Monocytes Absolute 0.6  0.1 - 1.0 (K/uL)   Eosinophils Relative 7 (*) 0 - 5 (%)   Eosinophils Absolute 0.3  0.0 - 0.7 (K/uL)   Basophils Relative 0  0 - 1 (%)   Basophils Absolute 0.0  0.0 - 0.1 (K/uL)  COMPREHENSIVE METABOLIC PANEL      Component Value Range   Sodium 131 (*) 135 - 145 (mEq/L)   Potassium 4.1  3.5 - 5.1 (mEq/L)   Chloride 97  96 - 112 (mEq/L)   CO2 24  19 - 32 (mEq/L)   Glucose, Bld 87  70 - 99 (mg/dL)   BUN 13  6 - 23 (mg/dL)   Creatinine, Ser 7.82  0.50 - 1.10 (mg/dL)   Calcium 95.6  8.4 - 10.5 (mg/dL)   Total Protein 7.8  6.0 - 8.3 (g/dL)   Albumin 3.8  3.5 - 5.2 (g/dL)   AST 21  0 - 37 (U/L)   ALT 9  0 - 35 (U/L)   Alkaline Phosphatase 65  39 - 117 (U/L)   Total Bilirubin 0.3  0.3 - 1.2 (mg/dL)   GFR calc non Af Amer 58 (*) >90 (mL/min)   GFR calc Af Amer 67 (*) >90 (mL/min)  LACTIC ACID, PLASMA      Component Value Range   Lactic Acid, Venous 1.5  0.5 - 2.2 (mmol/L)  URINALYSIS, ROUTINE W REFLEX MICROSCOPIC      Component Value Range   Color, Urine YELLOW  YELLOW    Appearance CLEAR  CLEAR    Specific Gravity, Urine 1.016  1.005 -  1.030    pH 7.0  5.0 - 8.0    Glucose, UA NEGATIVE  NEGATIVE (mg/dL)   Hgb urine dipstick NEGATIVE  NEGATIVE    Bilirubin Urine NEGATIVE  NEGATIVE    Ketones, ur NEGATIVE  NEGATIVE (mg/dL)   Protein, ur NEGATIVE  NEGATIVE (mg/dL)   Urobilinogen, UA 1.0  0.0 - 1.0 (mg/dL)   Nitrite NEGATIVE  NEGATIVE    Leukocytes, UA TRACE (*) NEGATIVE   LIPASE, BLOOD      Component Value Range   Lipase 23  11 - 59 (U/L)  URINE MICROSCOPIC-ADD ON      Component Value Range   Squamous Epithelial / LPF MANY (*) RARE    WBC, UA 0-2  <3 (WBC/hpf)  RBC / HPF 0-2  <3 (RBC/hpf)   Bacteria, UA RARE  RARE    Urine-Other AMORPHOUS URATES/PHOSPHATES    TROPONIN I      Component Value Range   Troponin I <0.30  <0.30 (ng/mL)      Radiological Exams on Admission: Dg Chest 2 View  08/02/2011  *RADIOLOGY REPORT*  Clinical Data: Medial chest pain x 2 days  CHEST - 2 VIEW  Comparison: 06/02/2011  Findings: Lungs are essentially clear. No pleural effusion or pneumothorax.  Cardiomediastinal silhouette is within normal limits.  Sternotomy wires.  Epicardial pacing wires.  Degenerative changes of the visualized thoracolumbar spine.  Surgical clips in the upper abdomen.  IMPRESSION: No evidence of acute cardiopulmonary disease.  Original Report Authenticated By: Charline Bills, M.D.   Ct Head Wo Contrast  08/02/2011  *RADIOLOGY REPORT*  Clinical Data: Headache, abdominal pain, nausea and vomiting  CT HEAD WITHOUT CONTRAST  Technique:  Contiguous axial images were obtained from the base of the skull through the vertex without contrast.  Comparison: 10/15/2006  Findings: No skull fracture is noted.  There is mucosal thickening bilateral maxillary sinus.  Mucosal thickening noted bilateral ethmoid air cells.  Mild mucosal thickening right sphenoid sinus. Frontal sinuses are hypoplastic.  The mastoid air cells are unremarkable.  No intracranial hemorrhage, mass effect or midline shift.  No acute infarction.  No mass  lesion is noted on this unenhanced scan.  No hydrocephalus.  No intra or extra-axial fluid collection.  IMPRESSION: No acute intracranial abnormality.  Sinuses disease as described above.  Original Report Authenticated By: Natasha Mead, M.D.   Ct Abdomen Pelvis W Contrast  08/02/2011  *RADIOLOGY REPORT*  Clinical Data: Abdominal pain.  Nausea and vomiting.  Headache. Surgical history includes bilateral adrenalectomy.  CT ABDOMEN AND PELVIS WITH CONTRAST 08/02/2011:  Technique:  Multidetector CT imaging of the abdomen and pelvis was performed following the standard protocol during bolus administration of intravenous contrast.  Contrast: 80mL OMNIPAQUE IOHEXOL 300 MG/ML IV. Oral contrast not administered due to emesis.  Comparison: CT abdomen and pelvis 06/02/2011.  Findings: Cysts in the liver near the dome and in the posterior segment right lobe, unchanged; no significant focal hepatic parenchymal abnormality.  Normal-appearing spleen and pancreas. Adrenal glands surgically absent.  Scarring involving the upper pole of the left kidney, and cortical cysts involving both kidneys; kidneys otherwise unremarkable and unchanged. Gallbladder borderline distended but without calcified gallstones or CT evidence for cholecystitis.  No biliary ductal dilation.  No visible aorto-iliofemoral atherosclerosis.  No significant lymphadenopathy.  Stomach decompressed and unremarkable.  Normal-appearing small bowel.  Descending and sigmoid colon diverticulosis without evidence of acute diverticulitis.  Remainder of the colon unremarkable.  Normal appendix in the right upper pelvis.  No ascites.  Atrophic left rectus muscle as noted previously.  Very small umbilical hernia containing fat.  Uterus not visualized and presumed surgically absent.  Normal- appearing right ovary by CT.  No visible left ovary, also presumed surgically absent.  No adnexal masses or free pelvic fluid. Urinary bladder decompressed and unremarkable.  Bone window  images demonstrate thoracolumbar scoliosis convex right, degenerative changes involving the lower thoracic and lumbar spine, and degenerative changes in the right sacral iliac joint; numerous sclerotic foci throughout the visualized skeleton.  Visualized lung bases clear apart from the expected dependent atelectasis posteriorly.  Heart mildly enlarged.  Epicardial pacing wires again noted.  IMPRESSION:  1.  No acute abnormalities involving the abdomen or pelvis. 2.  Sclerotic foci throughout the visualized  spine, pelvis, and proximal femora, unchanged from the prior examination.  In the absence of a history of breast cancer, this may be due to a metabolic bone disorder.  Clinical correlation. 3.  Scarring involving the upper pole of the left kidney. 4.  Descending and sigmoid colon diverticulosis.  Original Report Authenticated By: Arnell Sieving, M.D.      Orders placed during the hospital encounter of 08/02/11  . ED EKG  . ED EKG  . EKG 12-LEAD     Assessment/Plan Active Problems:  * No active hospital problems. *    Jasmen Emrich G 08/02/2011, 7:45 PM

## 2011-08-02 NOTE — H&P (Signed)
[name] is an 55 y.o. female.   Chief Complaint: Persistent nausea and vomiting HPI:  The patient is a 55 year old with several medical problems who was in her usual state of reasonable health until 2 days prior to admission when she began to develop symptoms of nausea and vomiting. The vomited material is described as green and yellow in color it was not associated with fever or chills or significant abdominal pain. Over the past few days she's had very little food or fluid intake. She's also had rhinitis with a dry cough and mild dyspnea on exertion. She had attempted to call our office yesterday and had transportation problems so that she could not come to the office for evaluation. Today she also described vague symptoms of substernal chest pain, vague mild headache. She denied dysuria, frequency, change in vision, or stiff neck. Her medical history is most significant for long-standing adrenal insufficiency for which he takes hydrocortisone 20 mg in the morning and 10 mg in the p.m. She also has hypothyroidism, gastroesophageal reflux disease, and depression. In March 2012 she was admitted to the hospital with similar symptoms and it was felt that she had a viral syndrome. She had CT scan study during that stay that was unremarkable. 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 She states that she has continued to take her hydrocortisone throughout this period of nausea and vomiting. She has no history of cardiac disease. Past Medical History  Diagnosis Date  . Thyroid disease   . Reflux   . 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 Blindness of left eye 1978  . Hyperlipemia   . Asthma   . Migraine   . Sinus headache   . Cushing's syndrome   . Blood transfusion   . Anemia   . Constipation   . Hemorrhoids   . Depression   . Arthritis     "in my lower back"   . Stomach ulcer     "from the hydrocortisone"    Past Surgical History  Procedure Date  . Adrenal gland surgery 1982    on hydrocortisone  . Atrial myxoma excision 1978  . Vaginal hysterectomy 2000    "partial; w/left ovary"  . Breast biopsy 07/1999    ductal tissue excision & bx right; bx only left breast  . Rotator cuff repair 05/2005    left  . Eye surgery   . Repaired cross eyed     "as a child"    History reviewed. No pertinent family history. Social History:  reports that she has never smoked. She has never used smokeless tobacco. She reports that she does not drink alcohol or use illicit drugs.  Allergies:  Allergies  Allergen Reactions  . Ibuprofen (Advil) Nausea And Vomiting  . Penicillins Nausea And Vomiting    Medications Prior to Admission  Medication Dose Route Frequency Provider Last Rate Last Dose  . 0.9 % NaCl with KCl 20 mEq/ L  infusion   Intravenous Continuous Garlan Fillers, MD      . acetaminophen (TYLENOL) 325 MG tablet           . acetaminophen (TYLENOL) tablet 650 mg  650 mg Oral Q6H PRN Garlan Fillers, MD       Or  . acetaminophen (TYLENOL) suppository 650 mg  650 mg Rectal Q6H PRN Barry Dienes  Eloise Harman, MD      . docusate sodium (COLACE) capsule 100 mg  100 mg Oral Daily Garlan Fillers, MD      . enoxaparin (LOVENOX) injection 40 mg  40 mg Subcutaneous Q24H Garlan Fillers, MD      . hydrocortisone sodium succinate (SOLU-CORTEF) injection 100 mg  100 mg Intravenous Once Glynn Octave, MD   100 mg at 08/02/11 1404  . influenza  inactive virus vaccine (FLUZONE/FLUARIX) injection 0.5 mL  0.5 mL Intramuscular Tomorrow-1000 Glynn Octave, MD      . iohexol (OMNIPAQUE) 300 MG/ML injection 80 mL  80 mL Intravenous Once PRN Medication Radiologist   80 mL at 08/02/11 1712  . iohexol (OMNIPAQUE) 300 MG/ML injection 80 mL  80 mL Intravenous Once PRN Medication Radiologist   80 mL at 08/02/11 1720  . levothyroxine (SYNTHROID, LEVOTHROID) tablet 75  mcg  75 mcg Oral QAC breakfast Garlan Fillers, MD      . morphine 4 MG/ML injection 4 mg  4 mg Intravenous Once Glynn Octave, MD   4 mg at 08/02/11 1403  . ondansetron (ZOFRAN) injection 4 mg  4 mg Intravenous Once Glynn Octave, MD   4 mg at 08/02/11 1402  . pantoprazole (PROTONIX) EC tablet 40 mg  40 mg Oral Q1200 Garlan Fillers, MD      . promethazine (PHENERGAN) tablet 12.5 mg  12.5 mg Oral Q6H PRN Garlan Fillers, MD       Or  . promethazine (PHENERGAN) injection 12.5 mg  12.5 mg Intravenous Q6H PRN Garlan Fillers, MD      . sodium chloride 0.9 % bolus 1,000 mL  1,000 mL Intravenous Once Glynn Octave, MD   1,000 mL at 08/02/11 1402  . sodium chloride 0.9 % bolus 1,000 mL  1,000 mL Intravenous Once Glynn Octave, MD      . sodium chloride 0.9 % bolus 1,000 mL  1,000 mL Intravenous Once Glynn Octave, MD      . venlafaxine (EFFEXOR-XR) 24 hr capsule 150 mg  150 mg Oral Daily Garlan Fillers, MD      . DISCONTD: acetaminophen (TYLENOL) tablet 650 mg  650 mg Oral Once Hurman Horn, MD   650 mg at 08/02/11 1300   No current outpatient prescriptions on file as of 08/02/2011.    Results for orders placed during the hospital encounter of 08/02/11 (from the past 48 hour(s))  URINALYSIS, ROUTINE W REFLEX MICROSCOPIC     Status: Abnormal   Collection Time   08/02/11  1:38 PM      Component Value Range Comment   Color, Urine YELLOW  YELLOW     Appearance CLEAR  CLEAR     Specific Gravity, Urine 1.016  1.005 - 1.030     pH 7.0  5.0 - 8.0     Glucose, UA NEGATIVE  NEGATIVE (mg/dL)    Hgb urine dipstick NEGATIVE  NEGATIVE     Bilirubin Urine NEGATIVE  NEGATIVE     Ketones, ur NEGATIVE  NEGATIVE (mg/dL)    Protein, ur NEGATIVE  NEGATIVE (mg/dL)    Urobilinogen, UA 1.0  0.0 - 1.0 (mg/dL)    Nitrite NEGATIVE  NEGATIVE     Leukocytes, UA TRACE (*) NEGATIVE    URINE MICROSCOPIC-ADD ON     Status: Abnormal   Collection Time   08/02/11  1:38 PM      Component Value Range  Comment   Squamous Epithelial / LPF MANY (*) RARE  WBC, UA 0-2  <3 (WBC/hpf)    RBC / HPF 0-2  <3 (RBC/hpf)    Bacteria, UA RARE  RARE     Urine-Other AMORPHOUS URATES/PHOSPHATES     CBC     Status: Normal   Collection Time   08/02/11  1:40 PM      Component Value Range Comment   WBC 4.6  4.0 - 10.5 (K/uL)    RBC 4.80  3.87 - 5.11 (MIL/uL)    Hemoglobin 14.5  12.0 - 15.0 (g/dL)    HCT 98.1  19.1 - 47.8 (%)    MCV 88.3  78.0 - 100.0 (fL)    MCH 30.2  26.0 - 34.0 (pg)    MCHC 34.2  30.0 - 36.0 (g/dL)    RDW 29.5  62.1 - 30.8 (%)    Platelets 240  150 - 400 (K/uL)   DIFFERENTIAL     Status: Abnormal   Collection Time   08/02/11  1:40 PM      Component Value Range Comment   Neutrophils Relative 53  43 - 77 (%)    Neutro Abs 2.4  1.7 - 7.7 (K/uL)    Lymphocytes Relative 27  12 - 46 (%)    Lymphs Abs 1.2  0.7 - 4.0 (K/uL)    Monocytes Relative 13 (*) 3 - 12 (%)    Monocytes Absolute 0.6  0.1 - 1.0 (K/uL)    Eosinophils Relative 7 (*) 0 - 5 (%)    Eosinophils Absolute 0.3  0.0 - 0.7 (K/uL)    Basophils Relative 0  0 - 1 (%)    Basophils Absolute 0.0  0.0 - 0.1 (K/uL)   COMPREHENSIVE METABOLIC PANEL     Status: Abnormal   Collection Time   08/02/11  1:40 PM      Component Value Range Comment   Sodium 131 (*) 135 - 145 (mEq/L)    Potassium 4.1  3.5 - 5.1 (mEq/L)    Chloride 97  96 - 112 (mEq/L)    CO2 24  19 - 32 (mEq/L)    Glucose, Bld 87  70 - 99 (mg/dL)    BUN 13  6 - 23 (mg/dL)    Creatinine, Ser 6.57  0.50 - 1.10 (mg/dL)    Calcium 84.6  8.4 - 10.5 (mg/dL)    Total Protein 7.8  6.0 - 8.3 (g/dL)    Albumin 3.8  3.5 - 5.2 (g/dL)    AST 21  0 - 37 (U/L)    ALT 9  0 - 35 (U/L)    Alkaline Phosphatase 65  39 - 117 (U/L)    Total Bilirubin 0.3  0.3 - 1.2 (mg/dL)    GFR calc non Af Amer 58 (*) >90 (mL/min)    GFR calc Af Amer 67 (*) >90 (mL/min)   LACTIC ACID, PLASMA     Status: Normal   Collection Time   08/02/11  1:40 PM      Component Value Range Comment   Lactic  Acid, Venous 1.5  0.5 - 2.2 (mmol/L)   LIPASE, BLOOD     Status: Normal   Collection Time   08/02/11  1:40 PM      Component Value Range Comment   Lipase 23  11 - 59 (U/L)   TROPONIN I     Status: Normal   Collection Time   08/02/11  3:46 PM      Component Value Range Comment   Troponin I <0.30  <0.30 (  ng/mL)    Dg Chest 2 View  08/02/2011  *RADIOLOGY REPORT*  Clinical Data: Medial chest pain x 2 days  CHEST - 2 VIEW  Comparison: 06/02/2011  Findings: Lungs are essentially clear. No pleural effusion or pneumothorax.  Cardiomediastinal silhouette is within normal limits.  Sternotomy wires.  Epicardial pacing wires.  Degenerative changes of the visualized thoracolumbar spine.  Surgical clips in the upper abdomen.  IMPRESSION: No evidence of acute cardiopulmonary disease.  Original Report Authenticated By: Charline Bills, M.D.   Ct Head Wo Contrast  08/02/2011  *RADIOLOGY REPORT*  Clinical Data: Headache, abdominal pain, nausea and vomiting  CT HEAD WITHOUT CONTRAST  Technique:  Contiguous axial images were obtained from the base of the skull through the vertex without contrast.  Comparison: 10/15/2006  Findings: No skull fracture is noted.  There is mucosal thickening bilateral maxillary sinus.  Mucosal thickening noted bilateral ethmoid air cells.  Mild mucosal thickening right sphenoid sinus. Frontal sinuses are hypoplastic.  The mastoid air cells are unremarkable.  No intracranial hemorrhage, mass effect or midline shift.  No acute infarction.  No mass lesion is noted on this unenhanced scan.  No hydrocephalus.  No intra or extra-axial fluid collection.  IMPRESSION: No acute intracranial abnormality.  Sinuses disease as described above.  Original Report Authenticated By: Natasha Mead, M.D.   Ct Abdomen Pelvis W Contrast  08/02/2011  *RADIOLOGY REPORT*  Clinical Data: Abdominal pain.  Nausea and vomiting.  Headache. Surgical history includes bilateral adrenalectomy.  CT ABDOMEN AND PELVIS WITH  CONTRAST 08/02/2011:  Technique:    IMPRESSION:  1.  No acute abnormalities involving the abdomen or pelvis. 2.  Sclerotic foci throughout the visualized spine, pelvis, and proximal femora, unchanged from the prior examination.  In the absence of a history of breast cancer, this may be due to a metabolic bone disorder.  Clinical correlation. 3.  Scarring involving the upper pole of the left kidney. 4.  Descending and sigmoid colon diverticulosis.  Original Report Authenticated By: Arnell Sieving, M.D.    ROS: She has chronic mild low back pain, and her she does not use a cane or a marker and has not had any recent falls. Lately she's had nausea and vomiting with mild headache, vague substernal chest pain and mild dyspnea on exertion. She denied recent symptoms of fever, chills, constipation, diarrhea, dysuria, frequency, anxiety, or depression. Blood pressure 106/75, pulse 100, temperature 98.3 F (36.8 C), temperature source Oral, resp. rate 18, height 5' (1.524 m), weight 79.1 kg (174 lb 6.1 oz), SpO2 95.00%.  PHYSICAL EXAM: In general, she is a well-nourished well-developed [place] woman who was in no apparent distress while lying at 30 elevation head of bed. HEENT exam is within normal limits, neck was supple without jugular venous distention or carotid bruit, chest was clear to auscultation, heart had a regular rate and rhythm without significant murmur or gallop, abdomen had normal bowel sounds no hepatosplenomegaly or tenderness, extremities were without cyanosis, clubbing, or edema and pedal pulses were normal. Neurologic exam: She is alert and well oriented with normal affect, cranial nerves II through XII were normal, sensory exam is grossly normal, motor strength was 5 out of 5 throughout, finger to nose testing was normal bilaterally, she had normal strength in all extremities, gait was not assessed in the emergency room. Assessment/Plan #1 Nausea and Vomiting: She has several  days of nausea and vomiting without a clear febrile illness, and with a benign abdomen exam and a normal CT  scan of the abdomen and pelvis as was a CT scan of the head. Her symptoms are most likely from a viral syndrome. Alternatively her symptoms could be from a viral syndrome combined with missed doses of hydrocortisone, however she denies this. Her recurrent episodes of nausea and vomiting leading to hospitalization is very suggestive of missed doses of hydrocortisone or vomited doses. We will continue IV fluids overnight and recheck labs in the morning. If she looks better in the morning she may be able to be discharged to home. #2 Adrenal Insufficiency: She has chronic adrenal insufficiency and will be given stress dose IV corticosteroids. She did present with hypotension and clearly needs the additional corticosteroids. She may be tapered to by mouth steroids at the time of discharge. #3 Hyponatremia: Mild and should resolve with IV normal saline. #4 Hypothyroidism: Clinically stable on levothyroxine which will be continued during her hospitalization. We will check a TSH and free T4 level at some point during her hospitalization.  Chibuikem Thang G 08/02/2011, 7:48 PM

## 2011-08-03 ENCOUNTER — Other Ambulatory Visit: Payer: Self-pay

## 2011-08-03 DIAGNOSIS — I959 Hypotension, unspecified: Secondary | ICD-10-CM | POA: Diagnosis present

## 2011-08-03 DIAGNOSIS — E871 Hypo-osmolality and hyponatremia: Secondary | ICD-10-CM | POA: Diagnosis present

## 2011-08-03 DIAGNOSIS — E2740 Unspecified adrenocortical insufficiency: Secondary | ICD-10-CM | POA: Diagnosis present

## 2011-08-03 DIAGNOSIS — R112 Nausea with vomiting, unspecified: Secondary | ICD-10-CM | POA: Diagnosis present

## 2011-08-03 DIAGNOSIS — F329 Major depressive disorder, single episode, unspecified: Secondary | ICD-10-CM | POA: Diagnosis present

## 2011-08-03 DIAGNOSIS — F32A Depression, unspecified: Secondary | ICD-10-CM | POA: Diagnosis present

## 2011-08-03 DIAGNOSIS — E039 Hypothyroidism, unspecified: Secondary | ICD-10-CM | POA: Diagnosis present

## 2011-08-03 LAB — COMPREHENSIVE METABOLIC PANEL
AST: 18 U/L (ref 0–37)
Albumin: 3.4 g/dL — ABNORMAL LOW (ref 3.5–5.2)
Calcium: 9.5 mg/dL (ref 8.4–10.5)
Creatinine, Ser: 0.98 mg/dL (ref 0.50–1.10)
Sodium: 131 mEq/L — ABNORMAL LOW (ref 135–145)

## 2011-08-03 LAB — CBC
MCH: 29.7 pg (ref 26.0–34.0)
MCV: 89.1 fL (ref 78.0–100.0)
Platelets: 231 10*3/uL (ref 150–400)
RDW: 13 % (ref 11.5–15.5)
WBC: 5.8 10*3/uL (ref 4.0–10.5)

## 2011-08-03 LAB — URINE CULTURE: Colony Count: 85000

## 2011-08-03 LAB — TROPONIN I: Troponin I: <0.3 ng/mL (ref <0.30)

## 2011-08-03 MED ORDER — VANCOMYCIN HCL IN DEXTROSE 1-5 GM/200ML-% IV SOLN
1000.0000 mg | Freq: Two times a day (BID) | INTRAVENOUS | Status: DC
  Administered 2011-08-03 – 2011-08-05 (×5): 1000 mg via INTRAVENOUS
  Filled 2011-08-03 (×7): qty 200

## 2011-08-03 NOTE — Progress Notes (Signed)
Chaplain's Note:  Pt was very receptive of visit.  Chaplain provided emotional and spiritual support. Concluded visit with prayer.  Please page if needed or requested.  [name]   [phone]  08/03/11 1756  Clinical Encounter Type  Visited With Patient  Visit Type Initial  Referral From Physician  Spiritual Encounters  Spiritual Needs Prayer  Stress Factors  Patient Stress Factors Exhausted;Health changes

## 2011-08-03 NOTE — Progress Notes (Signed)
ANTIBIOTIC CONSULT NOTE - INITIAL  Pharmacy Consult for Vancomycin Indication: GPC in clusters in blood  Allergies  Allergen Reactions  . Ibuprofen (Advil) Nausea And Vomiting  . Penicillins Nausea And Vomiting    Patient Measurements: Height: 5' (152.4 cm) Weight: 174 lb 6.1 oz (79.1 kg) IBW/kg (Calculated) : 45.5   Vital Signs: Temp: 98.2 F (36.8 C) (11/22 2042) Temp src: Oral (11/22 2042) BP: 103/71 mmHg (11/22 2042) Pulse Rate: 94  (11/22 2042) Intake/Output from previous day: 11/21 0701 - 11/22 0700 In: -  Out: 2 [Urine:2] Intake/Output from this shift:    Labs:  Basename 08/03/11 0630 08/02/11 2012 08/02/11 1340  WBC 5.8 8.8 4.6  HGB 12.8 13.7 14.5  PLT 231 243 240  LABCREA -- -- --  CREATININE 0.98 -- 1.08   Estimated Creatinine Clearance: 62.4 ml/min (by C-G formula based on Cr of 0.98).   Microbiology: 11/22 blood culture: gram positive cocci in clusters  Medical History: Past Medical History  Diagnosis Date  . Thyroid disease   . Reflux   . 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 Blindness of left eye 1978  . Hyperlipemia   . Asthma   . Migraine   . Sinus headache   . Cushing's syndrome   . Blood transfusion   . Anemia   . Constipation   . Hemorrhoids   . Depression   . 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 Stomach ulcer     "from the hydrocortisone"    Medications:  Prescriptions prior to admission  Medication Sig Dispense Refill  . docusate sodium (COLACE) 100 MG capsule Take 100 mg by mouth daily.        Marland Kitchen HYDROCORTISONE PO Take 20 mg by mouth daily.       . Levothyroxine Sodium (LEVOXYL PO) Take 75 mcg by mouth daily.       Marland Kitchen OMEPRAZOLE PO Take 20 mg by mouth 2 (two) times daily.       Marland Kitchen venlafaxine (EFFEXOR-XR) 150 MG 24 hr capsule Take 150 mg by mouth daily.        Marland Kitchen ZOLPIDEM TARTRATE PO Take 10 mg by mouth.        Assessment: 54yof to begin Vancomycin for GPC bacteremia.  Goal of  Therapy:  Vancomycin trough level 15-20 mcg/ml  Plan:  1) Vancomycin 1g IV q12 2) Follow renal function, cultures, troughs as indicated  Fredrik Rigger 08/03/2011,8:49 PM

## 2011-08-03 NOTE — Progress Notes (Signed)
CRITICAL VALUE ALERT  Critical value received: Growth in aerobic blood culture  Date of notification:  11/22  Time of notification:  2000  Critical value read back:yes  Nurse who received alert:  Idalia Needle RN  MD notified (1st page):  Tisovec  Time of first page:  2025  MD notified (2nd page):  Time of second page:  Responding MD:  Tisovec  Time MD responded:  2032

## 2011-08-03 NOTE — Progress Notes (Signed)
Subjective: She feels much better today. She is not having any nausea and has not had any vomiting overnight. She has mild rhinitis with a dry cough, no shortness of breath. She has not had any fever overnight or abdominal pain. She's not had had breakfast, but is interested in eating regular food today. She denies general chest pain or dysuria or frequency.  Objective: Vital signs in last 24 hours: Temp:  [98.1 F (36.7 C)-102.5 F (39.2 C)] 98.2 F (36.8 C) (11/22 0537) Pulse Rate:  [96-147] 97  (11/22 0537) Resp:  [18-19] 19  (11/22 0537) BP: (80-109)/(57-75) 103/68 mmHg (11/22 0537) SpO2:  [90 %-97 %] 97 % (11/22 0537) Weight:  [79.1 kg (174 lb 6.1 oz)] 174 lb 6.1 oz (79.1 kg) (11/21 1913) Weight change:  Last BM Date: 08/02/11  Intake/Output from previous day: 11/21 0701 - 11/22 0700 In: -  Out: 2 [Urine:2] Intake/Output this shift: Total I/O In: -  Out: 1 [Urine:1]  General appearance: alert, cooperative and no distress Neck: no adenopathy, no carotid bruit, no JVD, supple, symmetrical, trachea midline and thyroid not enlarged, symmetric, no tenderness/mass/nodules Resp: clear to auscultation bilaterally Cardio: regular rate and rhythm, S1, S2 normal, no murmur, click, rub or gallop GI: soft, non-tender; bowel sounds normal; no masses,  no organomegaly Neurologic: Grossly normal  Lab Results:  Basename 08/03/11 0630 08/02/11 2012  WBC 5.8 8.8  HGB 12.8 13.7  HCT 38.4 40.4  PLT 231 243   BMET  Basename 08/03/11 0630 08/02/11 1340  NA 131* 131*  K 4.9 4.1  CL 98 97  CO2 23 24  GLUCOSE 115* 87  BUN 18 13  CREATININE 0.98 1.08  CALCIUM 9.5 10.1   CMET CMP     Component Value Date/Time   NA 131* 08/03/2011 0630   K 4.9 08/03/2011 0630   CL 98 08/03/2011 0630   CO2 23 08/03/2011 0630   GLUCOSE 115* 08/03/2011 0630   BUN 18 08/03/2011 0630   CREATININE 0.98 08/03/2011 0630   CALCIUM 9.5 08/03/2011 0630   PROT 7.0 08/03/2011 0630   ALBUMIN 3.4*  08/03/2011 0630   AST 18 08/03/2011 0630   ALT 9 08/03/2011 0630   ALKPHOS 57 08/03/2011 0630   BILITOT 0.2* 08/03/2011 0630   GFRNONAA 65* 08/03/2011 0630   GFRAA 76* 08/03/2011 0630     Studies/Results: Dg Chest 2 View  08/02/2011  *RADIOLOGY REPORT*  Clinical Data: Medial chest pain x 2 days  CHEST - 2 VIEW  Comparison: 06/02/2011  Findings: Lungs are essentially clear. No pleural effusion or pneumothorax.  Cardiomediastinal silhouette is within normal limits.  Sternotomy wires.  Epicardial pacing wires.  Degenerative changes of the visualized thoracolumbar spine.  Surgical clips in the upper abdomen.  IMPRESSION: No evidence of acute cardiopulmonary disease.  Original Report Authenticated By: Charline Bills, M.D.   Ct Head Wo Contrast  08/02/2011  *RADIOLOGY REPORT*  Clinical Data: Headache, abdominal pain, nausea and vomiting  CT HEAD WITHOUT CONTRAST  Technique:  Contiguous axial images were obtained from the base of the skull through the vertex without contrast.  Comparison: 10/15/2006  Findings: No skull fracture is noted.  There is mucosal thickening bilateral maxillary sinus.  Mucosal thickening noted bilateral ethmoid air cells.  Mild mucosal thickening right sphenoid sinus. Frontal sinuses are hypoplastic.  The mastoid air cells are unremarkable.  No intracranial hemorrhage, mass effect or midline shift.  No acute infarction.  No mass lesion is noted on this unenhanced scan.  No  hydrocephalus.  No intra or extra-axial fluid collection.  IMPRESSION: No acute intracranial abnormality.  Sinuses disease as described above.  Original Report Authenticated By: Natasha Mead, M.D.   Ct Abdomen Pelvis W Contrast  08/02/2011  *RADIOLOGY REPORT*  Clinical Data: Abdominal pain.  Nausea and vomiting.  Headache. Surgical history includes bilateral adrenalectomy.  CT ABDOMEN AND PELVIS WITH CONTRAST 08/02/2011:  Technique:  .  IMPRESSION:  1.  No acute abnormalities involving the abdomen or pelvis.  2.  Sclerotic foci throughout the visualized spine, pelvis, and proximal femora, unchanged from the prior examination.  In the ION:GEXB of a history of breast cancer, this may be due to a metabolic bone disorder.  Clinical correlation. 3.  Scarring involving the upper pole of the left kidney. 4.  Descending and sigmoid colon diverticulosis.  Original Report Authenticated By: Arnell Sieving, M.D.   Today EKG shows the following:  Sinus tachycardia (rate 104), possible inferior infarct, age undetermined  Medications: I have reviewed the patient's current medications. He is Assessment/Plan:  #1 Nausea and Vomiting:  Most likely from adrenal insufficiency due to the acute stress of a viral syndrome and less likely due to a bacterial infection or a cardiac cause. We will continue stress dose corticosteroids and check cardiac enzymes today, and we will advance her to a regular diet. She can have any foods brought in by family members. #2 Hyponatremia:  Mild and stable, and should improve with IVF. Will recheck tomorrow. #3 Adrenal Insufficiency:  Stable on stress dose IV hydrocortisone.  LOS: 1 day   Kindel Rochefort G 08/03/2011, 8:56 AM

## 2011-08-04 MED ORDER — HYDROCORTISONE 20 MG PO TABS
20.0000 mg | ORAL_TABLET | Freq: Every day | ORAL | Status: DC
  Administered 2011-08-04 – 2011-08-05 (×2): 20 mg via ORAL
  Filled 2011-08-04 (×3): qty 1

## 2011-08-04 MED ORDER — LEVOTHYROXINE SODIUM 50 MCG PO TABS
50.0000 ug | ORAL_TABLET | Freq: Every day | ORAL | Status: DC
  Administered 2011-08-04 – 2011-08-06 (×3): 50 ug via ORAL
  Filled 2011-08-04 (×4): qty 1

## 2011-08-04 MED ORDER — HYDROCORTISONE 20 MG PO TABS
40.0000 mg | ORAL_TABLET | Freq: Every day | ORAL | Status: DC
  Administered 2011-08-05 – 2011-08-06 (×2): 40 mg via ORAL
  Filled 2011-08-04 (×3): qty 2

## 2011-08-04 MED ORDER — DM-GUAIFENESIN ER 30-600 MG PO TB12
1.0000 | ORAL_TABLET | Freq: Two times a day (BID) | ORAL | Status: DC | PRN
  Administered 2011-08-04: 1 via ORAL
  Filled 2011-08-04 (×2): qty 1

## 2011-08-04 MED ORDER — HYDROCORTISONE 20 MG PO TABS
40.0000 mg | ORAL_TABLET | Freq: Every day | ORAL | Status: DC
  Filled 2011-08-04 (×2): qty 2

## 2011-08-04 NOTE — Progress Notes (Signed)
Subjective: Slept OK. Still quite dizzy. Hasn't really ambulated yet. Breakfast is about 20% consumed. No CP   Objective: Vital signs in last 24 hours: Temp:  [97.2 F (36.2 C)-98.2 F (36.8 C)] 97.2 F (36.2 C) (11/23 0425) Pulse Rate:  [87-109] 87  (11/23 0425) Resp:  [18-20] 19  (11/23 0425) BP: (94-105)/(62-71) 94/64 mmHg (11/23 0425) SpO2:  [92 %-98 %] 97 % (11/23 0425)  Intake/Output from previous day: 11/22 0701 - 11/23 0700 In: 2158.7 [P.O.:702; I.V.:1256.7; IV Piggyback:200] Out: 1200 [Urine:1200] Intake/Output this shift:    General: alert, exotropia, anicteric, moist oral membranes, lungs clear. Ht regular with sys murmur abd soft NT, no edema. Awake, alert, mentating well, no tremor, no nystagmus  Lab Results   [facility] 08/03/11 0630 08/02/11 2012  WBC 5.8 8.8  RBC 4.31 4.54  HGB 12.8 13.7  HCT 38.4 40.4  MCV 89.1 89.0  MCH 29.7 30.2  RDW 13.0 13.3  PLT 231 243    Basename 08/03/11 0630 08/02/11 1340  NA 131* 131*  K 4.9 4.1  CL 98 97  CO2 23 24  GLUCOSE 115* 87  BUN 18 13  CREATININE 0.98 1.08  CALCIUM 9.5 10.1    Studies/Results: Dg Chest 2 View  08/02/2011  *RADIOLOGY REPORT*  Clinical Data: Medial chest pain x 2 days  CHEST - 2 VIEW  Comparison: 06/02/2011  Findings: Lungs are essentially clear. No pleural effusion or pneumothorax.  Cardiomediastinal silhouette is within normal limits.  Sternotomy wires.  Epicardial pacing wires.  Degenerative changes of the visualized thoracolumbar spine.  Surgical clips in the upper abdomen.  IMPRESSION: No evidence of acute cardiopulmonary disease.  Original Report Authenticated By: Charline Bills, M.D.   Ct Head Wo Contrast  08/02/2011  *RADIOLOGY REPORT*  Clinical Data: Headache, abdominal pain, nausea and vomiting  CT HEAD WITHOUT CONTRAST  Technique:  Contiguous axial images were obtained from the base of the skull through the vertex without contrast.  Comparison: 10/15/2006  Findings: No skull fracture  is noted.  There is mucosal thickening bilateral maxillary sinus.  Mucosal thickening noted bilateral ethmoid air cells.  Mild mucosal thickening right sphenoid sinus. Frontal sinuses are hypoplastic.  The mastoid air cells are unremarkable.  No intracranial hemorrhage, mass effect or midline shift.  No acute infarction.  No mass lesion is noted on this unenhanced scan.  No hydrocephalus.  No intra or extra-axial fluid collection.  IMPRESSION: No acute intracranial abnormality.  Sinuses disease as described above.  Original Report Authenticated By: Natasha Mead, M.D.   Ct Abdomen Pelvis W Contrast  08/02/2011  *RADIOLOGY REPORT*  Clinical Data: Abdominal pain.  Nausea and vomiting.  Headache. Surgical history includes bilateral adrenalectomy.  CT ABDOMEN AND PELVIS WITH CONTRAST 08/02/2011:  Technique:  Multidetector CT imaging of the abdomen and pelvis was performed following the standard protocol during bolus administration of intravenous contrast.  Contrast: 80mL OMNIPAQUE IOHEXOL 300 MG/ML IV. Oral contrast not administered due to emesis.  Comparison: CT abdomen and pelvis 06/02/2011.  Findings: Cysts in the liver near the dome and in the posterior segment right lobe, unchanged; no significant focal hepatic parenchymal abnormality.  Normal-appearing spleen and pancreas. Adrenal glands surgically absent.  Scarring involving the upper pole of the left kidney, and cortical cysts involving both kidneys; kidneys otherwise unremarkable and unchanged. Gallbladder borderline distended but without calcified gallstones or CT evidence for cholecystitis.  No biliary ductal dilation.  No visible aorto-iliofemoral atherosclerosis.  No significant lymphadenopathy.  Stomach decompressed and unremarkable.  Normal-appearing  small bowel.  Descending and sigmoid colon diverticulosis without evidence of acute diverticulitis.  Remainder of the colon unremarkable.  Normal appendix in the right upper pelvis.  No ascites.  Atrophic  left rectus muscle as noted previously.  Very small umbilical hernia containing fat.  Uterus not visualized and presumed surgically absent.  Normal- appearing right ovary by CT.  No visible left ovary, also presumed surgically absent.  No adnexal masses or free pelvic fluid. Urinary bladder decompressed and unremarkable.  Bone window images demonstrate thoracolumbar scoliosis convex right, degenerative changes involving the lower thoracic and lumbar spine, and degenerative changes in the right sacral iliac joint; numerous sclerotic foci throughout the visualized skeleton.  Visualized lung bases clear apart from the expected dependent atelectasis posteriorly.  Heart mildly enlarged.  Epicardial pacing wires again noted.  IMPRESSION:  1.  No acute abnormalities involving the abdomen or pelvis. 2.  Sclerotic foci throughout the visualized spine, pelvis, and proximal femora, unchanged from the prior examination.  In the absence of a history of breast cancer, this may be due to a metabolic bone disorder.  Clinical correlation. 3.  Scarring involving the upper pole of the left kidney. 4.  Descending and sigmoid colon diverticulosis.  Original Report Authenticated By: Arnell Sieving, M.D.    Scheduled Meds:   . docusate sodium  100 mg Oral Daily  . enoxaparin  40 mg Subcutaneous Q24H  . hydrocortisone sod succinate (SOLU-CORTEF) injection  100 mg Intravenous Q8H  . influenza  inactive virus vaccine  0.5 mL Intramuscular Tomorrow-1000  . levothyroxine  75 mcg Oral QAC breakfast  . pantoprazole  40 mg Oral Q1200  . sodium chloride  1,000 mL Intravenous Once  . sodium chloride  1,000 mL Intravenous Once  . vancomycin  1,000 mg Intravenous Q12H  . venlafaxine  150 mg Oral Daily   Continuous Infusions:   . 0.9 % NaCl with KCl 20 mEq / L 100 mL/hr at 08/03/11 2134   PRN Meds:acetaminophen, acetaminophen, promethazine, promethazine, zolpidem  Assessment/Plan: Patient Active Problem List  Diagnoses  .  Nausea & vomiting: doing better. ? Viral cause  . Hypotension: somewhat better  . Adrenal insufficiency: change to PO at 2X usual dose  . Hyponatremia: stable at 131  . Hypothyroidism: overreplaced, reduce Rx  . Depression: seems fair Dizziness. May be fluid related      LOS: 2 days   [name] 08/04/2011, 8:18 AM

## 2011-08-05 LAB — CULTURE, BLOOD (ROUTINE X 2)

## 2011-08-05 MED ORDER — HYDRALAZINE HCL 20 MG/ML IJ SOLN: 10.0000 mg | Freq: Four times a day (QID) | INTRAMUSCULAR | Status: DC | PRN

## 2011-08-05 NOTE — Progress Notes (Signed)
Subjective: Still feels dizzy when she tries to get up.  No nausea/vomitting.  Some cough related to phlegm in the back of her throat.  Objective: Vital signs in last 24 hours: Temp:  [97.2 F (36.2 C)-98.4 F (36.9 C)] 97.2 F (36.2 C) (11/24 0529) Pulse Rate:  [79-101] 80  (11/24 0529) Resp:  [18] 18  (11/24 0529) BP: (104-122)/(68-72) 122/72 mmHg (11/24 0529) SpO2:  [97 %-99 %] 99 % (11/24 0529) Weight change:  Last BM Date: 08/02/11  CBG (last 3)  No results found for this basename: GLUCAP:3 in the last 72 hours  Intake/Output from previous day: 11/23 0701 - 11/24 0700 In: 2200 [P.O.:600; I.V.:1200; IV Piggyback:400] Out: 950 [Urine:950] Intake/Output this shift: Total I/O In: 200 [IV Piggyback:200] Out: -   General appearance: alert and oriented- no acute distress Eyes: no scleral icterus, dysconjugate gaze Throat: oropharynx moist without erythema Resp: clear to auscultation bilaterally and coughing some Cardio: regular rate and rhythm, S1, S2 normal, no murmur, click, rub or gallop GI: soft, non-tender; bowel sounds normal; no masses,  no organomegaly Extremities: no clubbing, cyanosis or edema   Lab Results: Blood Culture    Component Value Date/Time   SDES BLOOD HAND RIGHT 08/02/2011 1400   SPECREQUEST BOTTLES DRAWN AEROBIC AND ANAEROBIC 10CC 08/02/2011 1400   CULT  Value: GRAM POSITIVE COCCI IN CLUSTERS Note: Gram Stain Report Called to,Read Back By and Verified With: TRAVIS JOLLY 08/03/11 @ 2009 HAJAM 08/02/2011 1400   REPTSTATUS PENDING 08/02/2011 1400     Basename 08/03/11 0630 08/02/11 1340  NA 131* 131*  K 4.9 4.1  CL 98 97  CO2 23 24  GLUCOSE 115* 87  BUN 18 13  CREATININE 0.98 1.08  CALCIUM 9.5 10.1  MG -- --  PHOS -- --    Basename 08/03/11 0630 08/02/11 1340  AST 18 21  ALT 9 9  ALKPHOS 57 65  BILITOT 0.2* 0.3  PROT 7.0 7.8  ALBUMIN 3.4* 3.8    Basename 08/03/11 0630 08/02/11 2012 08/02/11 1340  WBC 5.8 8.8 --  NEUTROABS -- --  2.4  HGB 12.8 13.7 --  HCT 38.4 40.4 --  MCV 89.1 89.0 --  PLT 231 243 --   No results found for this basename: INR, PROTIME    Basename 08/03/11 1010 08/02/11 1546  CKTOTAL 71 --  CKMB 2.1 --  CKMBINDEX -- --  TROPONINI <0.30 <0.30    Basename 08/03/11 1005  TSH 0.023*  T4TOTAL --  T3FREE --  THYROIDAB --   No results found for this basename: VITAMINB12:2,FOLATE:2,FERRITIN:2,TIBC:2,IRON:2,RETICCTPCT:2 in the last 72 hours  Studies/Results: No results found.   Medications: Scheduled:   . docusate sodium  100 mg Oral Daily  . enoxaparin  40 mg Subcutaneous Q24H  . hydrocortisone  20 mg Oral Daily  . hydrocortisone  40 mg Oral Q breakfast  . levothyroxine  50 mcg Oral QAC breakfast  . pantoprazole  40 mg Oral Q1200  . sodium chloride  1,000 mL Intravenous Once  . sodium chloride  1,000 mL Intravenous Once  . vancomycin  1,000 mg Intravenous Q12H  . venlafaxine  150 mg Oral Daily   Continuous:   . 0.9 % NaCl with KCl 20 mEq / L 100 mL/hr at 08/05/11 0631    Assessment/Plan: Principal Problem: 1. *Hypotension- secondary to volume depletion and adrenal insufficiency- improved though she is still complaining of othostatic symptoms.  Continue hydration.  Check orthostatics daily.  Continue Vancomycin pending final blood cultures (gram  positive cocci in 1 of 2 cultures may be contaminant) Active Problems: 2. Nausea & vomiting- improved.  ?viral gastroenteritis vs. Adrenal insufficiency. 3. Adrenal insufficiency- continue stress dose steroids.  Taper as tolerated. 4. Hyponatremia- recheck in am. 5. Hypothyroidism- continue lower dose of synthroid. 6. Dispo- anticipate discharge in 1-2 days if orthostasis improved.   LOS: 3 days   Eliud Polo,W DOUGLAS 08/05/2011, 9:17 AM

## 2011-08-06 LAB — BASIC METABOLIC PANEL
BUN: 17 mg/dL (ref 6–23)
CO2: 28 mEq/L (ref 19–32)
Calcium: 9.4 mg/dL (ref 8.4–10.5)
Creatinine, Ser: 0.84 mg/dL (ref 0.50–1.10)
Glucose, Bld: 69 mg/dL — ABNORMAL LOW (ref 70–99)

## 2011-08-06 LAB — CBC
Hemoglobin: 11.8 g/dL — ABNORMAL LOW (ref 12.0–15.0)
MCH: 29.5 pg (ref 26.0–34.0)
MCHC: 32.3 g/dL (ref 30.0–36.0)
MCV: 91.3 fL (ref 78.0–100.0)
RBC: 4 MIL/uL (ref 3.87–5.11)

## 2011-08-06 MED ORDER — LEVOTHYROXINE SODIUM 50 MCG PO TABS: 50.0000 ug | ORAL_TABLET | Freq: Every day | ORAL | Status: DC

## 2011-08-06 MED ORDER — HYDROCORTISONE 20 MG PO TABS: 20.0000 mg | ORAL_TABLET | Freq: Every day | ORAL | Status: AC

## 2011-08-06 MED ORDER — PROMETHAZINE HCL 12.5 MG PO TABS: 12.5000 mg | ORAL_TABLET | Freq: Four times a day (QID) | ORAL | Status: AC | PRN

## 2011-08-06 MED ORDER — DM-GUAIFENESIN ER 30-600 MG PO TB12: 1.0000 | ORAL_TABLET | Freq: Two times a day (BID) | ORAL | Status: AC | PRN

## 2011-08-06 NOTE — Progress Notes (Signed)
ANTIBIOTIC CONSULT NOTE - FOLLOW UP  Pharmacy Consult for Vancomycin Indication: GPC in clusters in blood  Allergies  Allergen Reactions  . Ibuprofen (Advil) Nausea And Vomiting  . Penicillins Nausea And Vomiting    Patient Measurements: Height: 5' (152.4 cm) Weight: 174 lb 6.1 oz (79.1 kg) IBW/kg (Calculated) : 45.5   Vital Signs: Temp: 97.9 F (36.6 C) (11/25 0500) BP: 139/89 mmHg (11/25 0558) Pulse Rate: 71  (11/25 0558) Intake/Output from previous day: 11/24 0701 - 11/25 0700 In: 3268.3 [P.O.:720; I.V.:2348.3; IV Piggyback:200] Out: 1 [Stool:1] Intake/Output from this shift: Total I/O In: 360 [P.O.:360] Out: 2 [Urine:2]  Labs:  Basename 08/06/11 0600  WBC 5.9  HGB 11.8*  PLT 256  LABCREA --  CREATININE 0.84   Estimated Creatinine Clearance: 72.8 ml/min (by C-G formula based on Cr of 0.84).  Basename 08/06/11 0944  VANCOTROUGH 16.3  VANCOPEAK --  VANCORANDOM --  GENTTROUGH --  GENTPEAK --  GENTRANDOM --  TOBRATROUGH --  TOBRAPEAK --  TOBRARND --  AMIKACINPEAK --  AMIKACINTROU --  AMIKACIN --     Microbiology: Recent Results (from the past 720 hour(s))  URINE CULTURE     Status: Normal   Collection Time   08/02/11  1:38 PM      Component Value Range Status Comment   Specimen Description URINE, RANDOM   Final    Special Requests NONE   Final    Setup Time [id number]   Final    Colony Count 85,000 COLONIES/ML   Final    Culture     Final    Value: Multiple bacterial morphotypes present, none predominant. Suggest appropriate recollection if clinically indicated.   Report Status 08/03/2011 FINAL   Final   CULTURE, BLOOD (ROUTINE X 2)     Status: Normal (Preliminary result)   Collection Time   08/02/11  1:40 PM      Component Value Range Status Comment   Specimen Description BLOOD ARM LEFT   Final    Special Requests BOTTLES DRAWN AEROBIC AND ANAEROBIC 5CC   Final    Setup Time [id number]   Final    Culture     Final    Value:        BLOOD  CULTURE RECEIVED NO GROWTH TO DATE CULTURE WILL BE HELD FOR 5 DAYS BEFORE ISSUING A FINAL NEGATIVE REPORT   Report Status PENDING   Incomplete   CULTURE, BLOOD (ROUTINE X 2)     Status: Normal   Collection Time   08/02/11  2:00 PM      Component Value Range Status Comment   Specimen Description BLOOD HAND RIGHT   Final    Special Requests BOTTLES DRAWN AEROBIC AND ANAEROBIC 10CC   Final    Setup Time [id number]   Final    Culture     Final    Value: STAPHYLOCOCCUS SPECIES (COAGULASE NEGATIVE)     Note: THE SIGNIFICANCE OF ISOLATING THIS ORGANISM FROM A SINGLE SET OF BLOOD CULTURES WHEN MULTIPLE SETS ARE DRAWN IS UNCERTAIN. PLEASE NOTIFY THE MICROBIOLOGY DEPARTMENT WITHIN ONE WEEK IF SPECIATION AND SENSITIVITIES ARE REQUIRED.     Note: Gram Stain Report Called to,Read Back By and Verified With: TRAVIS JOLLY 08/03/11 @ 2009 HAJAM   Report Status 08/05/2011 FINAL   Final     Anti-infectives     Start     Dose/Rate Route Frequency Ordered Stop   08/03/11 2130   vancomycin (VANCOCIN) IVPB 1000 mg/200 mL premix  1,000 mg 200 mL/hr over 60 Minutes Intravenous Every 12 hours 08/03/11 2055            Assessment: 55 yo F treated with vancomycin for possible GPC bacteremia. Patient has been afebrile and 2nd culture was negative. Medical team feels + culture due to contamination based on patient course and no fevers. Renal fx stable. Vanc trough at goal  Goal of Therapy:  Vancomycin trough level 15-20 mcg/ml  Plan:   Patient to be d/c'd home and vanc to be d/c'd prior to discharge  Janace Litten, PharmD [phone] 08/06/2011,11:47 AM

## 2011-08-06 NOTE — Discharge Summary (Signed)
DISCHARGE SUMMARY  [name]  MR#: [id number]  DOB:10/15/1955  Date of Admission: 08/02/2011 Date of Discharge: 08/06/2011  Attending Physician:SOUTH, Rosalio Loud, MD  Patient's JXB:JYNWG,NFAOZHY [name], MD  Consults: None Discharge Diagnoses: Principal Problem:  *Hypotension Active Problems:  Nausea & vomiting  Adrenal insufficiency  Hyponatremia  Hypothyroidism  Depression  Past Medical History  Diagnosis Date  . Thyroid disease   . Reflux   . 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 Blindness of left eye 1978  . Hyperlipemia   . Asthma   . Migraine   . Sinus headache   . Cushing's syndrome   . Blood transfusion   . Anemia   . Constipation   . Hemorrhoids   . Depression   . 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 Stomach ulcer     "from the hydrocortisone"   Past Surgical History  Procedure Date  . Adrenal gland surgery 1982    on hydrocortisone  . Atrial myxoma excision 1978  . Vaginal hysterectomy 2000    "partial; w/left ovary"  . Breast biopsy 07/1999    ductal tissue excision & bx right; bx only left breast  . Rotator cuff repair 05/2005    left  . Eye surgery   . Repaired cross eyed     "as a child"    Discharge Medications: Current Discharge Medication List    START taking these medications   Details  dextromethorphan-guaiFENesin (MUCINEX DM) 30-600 MG per 12 hr tablet Take 1 tablet by mouth 2 (two) times daily as needed.    promethazine (PHENERGAN) 12.5 MG tablet Take 1 tablet (12.5 mg total) by mouth every 6 (six) hours as needed for nausea. Qty: 30 tablet, Refills: 1      CONTINUE these medications which have CHANGED   Details  hydrocortisone (CORTEF) 20 MG tablet Take 2 tablets (40 mg total) by mouth daily X 3 days then 1 tablet by mouth daily. Qty: 60 tablet, Refills: 3    levothyroxine (SYNTHROID, LEVOTHROID) 50 MCG tablet Take 1 tablet (50 mcg total) by mouth daily before  breakfast. Qty: 30 tablet, Refills: 6      CONTINUE these medications which have NOT CHANGED   Details  docusate sodium (COLACE) 100 MG capsule Take 100 mg by mouth daily.      OMEPRAZOLE PO Take 20 mg by mouth 2 (two) times daily.     venlafaxine (EFFEXOR-XR) 150 MG 24 hr capsule Take 150 mg by mouth daily.      ZOLPIDEM TARTRATE PO Take 10 mg by mouth.         Hospital Procedures: Dg Chest 2 View  08/02/2011  *RADIOLOGY REPORT*  Clinical Data: Medial chest pain x 2 days  CHEST - 2 VIEW  Comparison: 06/02/2011  Findings: Lungs are essentially clear. No pleural effusion or pneumothorax.  Cardiomediastinal silhouette is within normal limits.  Sternotomy wires.  Epicardial pacing wires.  Degenerative changes of the visualized thoracolumbar spine.  Surgical clips in the upper abdomen.  IMPRESSION: No evidence of acute cardiopulmonary disease.  Original Report Authenticated By: Charline Bills, M.D.   Ct Head Wo Contrast  08/02/2011  *RADIOLOGY REPORT*  Clinical Data: Headache, abdominal pain, nausea and vomiting  CT HEAD WITHOUT CONTRAST  Technique:  Contiguous axial images were obtained from the base of the skull through the vertex without contrast.  Comparison: 10/15/2006  Findings: No skull fracture is  noted.  There is mucosal thickening bilateral maxillary sinus.  Mucosal thickening noted bilateral ethmoid air cells.  Mild mucosal thickening right sphenoid sinus. Frontal sinuses are hypoplastic.  The mastoid air cells are unremarkable.  No intracranial hemorrhage, mass effect or midline shift.  No acute infarction.  No mass lesion is noted on this unenhanced scan.  No hydrocephalus.  No intra or extra-axial fluid collection.  IMPRESSION: No acute intracranial abnormality.  Sinuses disease as described above.  Original Report Authenticated By: Natasha Mead, M.D.   Ct Abdomen Pelvis W Contrast  08/02/2011  *RADIOLOGY REPORT*  Clinical Data: Abdominal pain.  Nausea and vomiting.  Headache.  Surgical history includes bilateral adrenalectomy.  CT ABDOMEN AND PELVIS WITH CONTRAST 08/02/2011:  Technique:  Multidetector CT imaging of the abdomen and pelvis was performed following the standard protocol during bolus administration of intravenous contrast.  Contrast: 80mL OMNIPAQUE IOHEXOL 300 MG/ML IV. Oral contrast not administered due to emesis.  Comparison: CT abdomen and pelvis 06/02/2011.  Findings: Cysts in the liver near the dome and in the posterior segment right lobe, unchanged; no significant focal hepatic parenchymal abnormality.  Normal-appearing spleen and pancreas. Adrenal glands surgically absent.  Scarring involving the upper pole of the left kidney, and cortical cysts involving both kidneys; kidneys otherwise unremarkable and unchanged. Gallbladder borderline distended but without calcified gallstones or CT evidence for cholecystitis.  No biliary ductal dilation.  No visible aorto-iliofemoral atherosclerosis.  No significant lymphadenopathy.  Stomach decompressed and unremarkable.  Normal-appearing small bowel.  Descending and sigmoid colon diverticulosis without evidence of acute diverticulitis.  Remainder of the colon unremarkable.  Normal appendix in the right upper pelvis.  No ascites.  Atrophic left rectus muscle as noted previously.  Very small umbilical hernia containing fat.  Uterus not visualized and presumed surgically absent.  Normal- appearing right ovary by CT.  No visible left ovary, also presumed surgically absent.  No adnexal masses or free pelvic fluid. Urinary bladder decompressed and unremarkable.  Bone window images demonstrate thoracolumbar scoliosis convex right, degenerative changes involving the lower thoracic and lumbar spine, and degenerative changes in the right sacral iliac joint; numerous sclerotic foci throughout the visualized skeleton.  Visualized lung bases clear apart from the expected dependent atelectasis posteriorly.  Heart mildly enlarged.  Epicardial  pacing wires again noted.  IMPRESSION:  1.  No acute abnormalities involving the abdomen or pelvis. 2.  Sclerotic foci throughout the visualized spine, pelvis, and proximal femora, unchanged from the prior examination.  In the absence of a history of breast cancer, this may be due to a metabolic bone disorder.  Clinical correlation. 3.  Scarring involving the upper pole of the left kidney. 4.  Descending and sigmoid colon diverticulosis.  Original Report Authenticated By: Arnell Sieving, M.D.    History of Present Illness: The patient is a 55 year old with several medical problems who was in her usual state of reasonable health until 2 days prior to admission when she began to develop symptoms of nausea and vomiting. The vomited material is described as green and yellow in color it was not associated with fever or chills or significant abdominal pain. Over the past few days she's had very little food or fluid intake. She's also had rhinitis with a dry cough and mild dyspnea on exertion. She had attempted to call our office yesterday and had transportation problems so that she could not come to the office for evaluation. Today she also described vague symptoms of substernal chest pain, vague mild headache.  She denied dysuria, frequency, change in vision, or stiff neck. Her medical history is most significant for long-standing adrenal insufficiency for which he takes hydrocortisone 20 mg in the morning and 10 mg in the p.m. She also has hypothyroidism, gastroesophageal reflux disease, and depression. In March 2012 she was admitted to the hospital with similar symptoms and it was felt that she had a viral syndrome. She had CT scan study during that stay that was unremarkable. 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 She states that she has continued to take her hydrocortisone throughout this period of nausea and  vomiting. She has no history of cardiac disease.  On admission her blood pressures were borderline hypotensive with tachycardia.  CT was negative for any acute findings.  Hospital Course: Ms. Babin was admitted to a medical bed.  She was placed on stress dose Solu-Cortef for adrenal insufficiency. She was provided IV fluids and IV anti-emetics.  With this treatment her condition slowly improved. He remained orthostatic and dizzy up until the day of discharge. At this point, she is ambulating without any dizziness or lightheadedness.  She is tolerating oral intake and her care which had been transitioned to oral hydrocortisone. She will be placed on a taper of hydrocortisone back to her home dose.  At this point, she is stable and ready for discharge home.  Of note, her TSH was suppressed and her Synthroid dose was decreased.  She was continued on all of her other medications. She did have an isolated blood culture that was positive for coag-negative staph. This was felt to be a contaminant as the second blood culture is negative and she has not been febrile.  She was on vancomycin pending culture finality and this will be discontinued prior to discharge.   Day of Discharge Exam BP 139/89  Pulse 71  Temp(Src) 97.9 F (36.6 C) (Oral)  Resp 19  Ht 5' (1.524 m)  Wt 79.1 kg (174 lb 6.1 oz)  BMI 34.06 kg/m2  SpO2 100%  Physical Exam: General appearance: alert and jovial- ambulating independantly and singing worships songs Eyes: Disconjugate gaze Throat: oropharynx moist without erythema Resp: clear to auscultation bilaterally Cardio: regular rate and rhythm, S1, S2 normal, no murmur, click, rub or gallop GI: soft, non-tender; bowel sounds normal; no masses,  no organomegaly Extremities: no clubbing, cyanosis or edema  Discharge Labs:  [facility] 08/06/11 0600  NA 139  K 4.4  CL 105  CO2 28  GLUCOSE 69*  BUN 17  CREATININE 0.84  CALCIUM 9.4  MG --  PHOS --     Basename 08/06/11  0600  WBC 5.9  NEUTROABS --  HGB 11.8*  HCT 36.5  MCV 91.3  PLT 256     Basename 08/03/11 1010  CKTOTAL 71  CKMB 2.1  CKMBINDEX --  TROPONINI <0.30    Basename 08/03/11 1005  TSH 0.023*  T4TOTAL --  T3FREE --  THYROIDAB --    Discharge instructions: Discharge Orders    Future Orders Please Complete By Expires   Diet - low sodium heart healthy      Increase activity slowly      Discharge instructions      Comments:   Call Dr. [name] if increased nausea/vomitting, lightheadedness.  Call if fever above 101.5.  Bring all medications to follow-up visit for reconciliation.      Disposition: To home Follow-up Appts: Follow-up with Dr. [name] at [facility] in 1 wee.  Call for appointment.  Condition on Discharge: improved Tests Needing Follow-up: None  Time with discharge activities: 40 minutes Signed: Sanad Fearnow,W DOUGLAS 08/06/2011, 9:48 AM

## 2011-08-08 LAB — CULTURE, BLOOD (ROUTINE X 2)
Culture  Setup Time: 201211212345
Culture: NO GROWTH

## 2011-09-24 ENCOUNTER — Emergency Department (HOSPITAL_COMMUNITY)
Admission: EM | Admit: 2011-09-24 | Discharge: 2011-09-24 | Disposition: A | Discharge status: Home / Self Care | Payer: Medicaid Other | Attending: Emergency Medicine | Admitting: Emergency Medicine

## 2011-09-24 ENCOUNTER — Other Ambulatory Visit: Payer: Self-pay

## 2011-09-24 ENCOUNTER — Encounter (HOSPITAL_COMMUNITY): Payer: Self-pay | Admitting: *Deleted

## 2011-09-24 ENCOUNTER — Emergency Department (HOSPITAL_COMMUNITY): Payer: Medicaid Other

## 2011-09-24 DIAGNOSIS — Z79899 Other long term (current) drug therapy: Secondary | ICD-10-CM | POA: Insufficient documentation

## 2011-09-24 DIAGNOSIS — R4182 Altered mental status, unspecified: Secondary | ICD-10-CM | POA: Insufficient documentation

## 2011-09-24 DIAGNOSIS — R209 Unspecified disturbances of skin sensation: Secondary | ICD-10-CM | POA: Insufficient documentation

## 2011-09-24 DIAGNOSIS — G43909 Migraine, unspecified, not intractable, without status migrainosus: Secondary | ICD-10-CM | POA: Insufficient documentation

## 2011-09-24 DIAGNOSIS — R5381 Other malaise: Secondary | ICD-10-CM | POA: Insufficient documentation

## 2011-09-24 DIAGNOSIS — Z8673 Personal history of transient ischemic attack (TIA), and cerebral infarction without residual deficits: Secondary | ICD-10-CM | POA: Insufficient documentation

## 2011-09-24 DIAGNOSIS — R55 Syncope and collapse: Secondary | ICD-10-CM | POA: Insufficient documentation

## 2011-09-24 DIAGNOSIS — R5383 Other fatigue: Secondary | ICD-10-CM | POA: Insufficient documentation

## 2011-09-24 DIAGNOSIS — E079 Disorder of thyroid, unspecified: Secondary | ICD-10-CM | POA: Insufficient documentation

## 2011-09-24 LAB — TROPONIN I: Troponin I: <0.3 ng/mL (ref <0.30)

## 2011-09-24 LAB — POCT I-STAT, CHEM 8
BUN: 8 mg/dL (ref 6–23)
Calcium, Ion: 1.25 mmol/L (ref 1.12–1.32)
Glucose, Bld: 102 mg/dL — ABNORMAL HIGH (ref 70–99)
HCT: 42 % (ref 36.0–46.0)
TCO2: 27 mmol/L (ref 0–100)

## 2011-09-24 LAB — CBC
MCH: 30.3 pg (ref 26.0–34.0)
MCHC: 34.1 g/dL (ref 30.0–36.0)
Platelets: 271 10*3/uL (ref 150–400)
RBC: 4.55 MIL/uL (ref 3.87–5.11)
RDW: 13.9 % (ref 11.5–15.5)

## 2011-09-24 LAB — URINALYSIS, ROUTINE W REFLEX MICROSCOPIC
Ketones, ur: NEGATIVE mg/dL
Leukocytes, UA: NEGATIVE
Protein, ur: NEGATIVE mg/dL
Urobilinogen, UA: 0.2 mg/dL (ref 0.0–1.0)

## 2011-09-24 LAB — DIFFERENTIAL
Basophils Relative: 0 % (ref 0–1)
Eosinophils Absolute: 0.3 10*3/uL (ref 0.0–0.7)
Neutrophils Relative %: 48 % (ref 43–77)

## 2011-09-24 MED ORDER — HYDROCORTISONE SOD SUCCINATE 100 MG IJ SOLR
100.0000 mg | Freq: Once | INTRAMUSCULAR | Status: AC
  Administered 2011-09-24: 100 mg via INTRAVENOUS
  Filled 2011-09-24: qty 2

## 2011-09-24 MED ORDER — MORPHINE SULFATE 4 MG/ML IJ SOLN
4.0000 mg | Freq: Once | INTRAMUSCULAR | Status: AC
  Administered 2011-09-24: 4 mg via INTRAVENOUS
  Filled 2011-09-24: qty 1

## 2011-09-24 MED ORDER — ONDANSETRON HCL 4 MG/2ML IJ SOLN
4.0000 mg | Freq: Once | INTRAMUSCULAR | Status: AC
  Administered 2011-09-24: 4 mg via INTRAVENOUS
  Filled 2011-09-24: qty 2

## 2011-09-24 NOTE — ED Provider Notes (Signed)
Medical screening examination/treatment/procedure(s) were performed by non-physician practitioner and as supervising physician I was immediately available for consultation/collaboration.  Loren Racer, MD 09/24/11 [phone]

## 2011-09-24 NOTE — ED Provider Notes (Signed)
Medical screening examination/treatment/procedure(s) were performed by non-physician practitioner and as supervising physician I was immediately available for consultation/collaboration.  Diogenes Whirley, MD 09/24/11 2316 

## 2011-09-24 NOTE — ED Provider Notes (Signed)
History     CSN: [id number]  Arrival date & time 09/24/11  1406   First MD Initiated Contact with Patient 09/24/11 1734      Chief Complaint  Patient presents with  . Altered Mental Status    (Consider location/radiation/quality/duration/timing/severity/associated sxs/prior treatment) HPI Comments: Patient states she was in church became nauseated and dizzy vomited twice at the church [place] home walked into her room and passed out in her hallway per her report, which is vague.  Her niece or nephew states she was unconscious for 45 minutes.  She woke up with a headache.  She has a history of migraine headaches.  She also noticed that her left arm was numb and tingly.  This has resolved.  At this time.  She did not take any of her normal headache medication, which is oxycodone.  She does have adrenal insufficiency and takes 30 mg of hydrocortisone daily. Split as follows 20 mg in the morning and 10 mg at night.  She states she has taken the 20 mg for today.   Patient is a 56 y.o. female presenting with altered mental status. The history is provided by the patient.  Altered Mental Status The current episode started today. The problem has been gradually improving. Associated symptoms include headaches, nausea, numbness, vomiting and weakness. Pertinent negatives include no chest pain, chills, congestion, coughing, diaphoresis, fever, sore throat, swollen glands, vertigo or visual change. She has tried nothing for the symptoms.    Past Medical History  Diagnosis Date  . Thyroid disease   . Reflux   . 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 Blindness of left eye 1978  . Hyperlipemia   . Asthma   . Migraine   . Sinus headache   . Cushing's syndrome   . Blood transfusion   . Anemia   . Constipation   . Hemorrhoids   . Depression   . 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 Stomach ulcer     "from the hydrocortisone"    Past Surgical History    Procedure Date  . Adrenal gland surgery 1982    on hydrocortisone  . Atrial myxoma excision 1978  . Vaginal hysterectomy 2000    "partial; w/left ovary"  . Breast biopsy 07/1999    ductal tissue excision & bx right; bx only left breast  . Rotator cuff repair 05/2005    left  . Eye surgery   . Repaired cross eyed     "as a child"    History reviewed. No pertinent family history.  History  Substance Use Topics  . Smoking status: Never Smoker   . Smokeless tobacco: Never Used  . Alcohol Use: No    OB History    Grav Para Term Preterm Abortions TAB SAB Ect Mult Living                  Review of Systems  Constitutional: Negative for fever, chills and diaphoresis.  HENT: Negative for congestion and sore throat.   Eyes: Negative for photophobia and visual disturbance.  Respiratory: Negative for cough and shortness of breath.   Cardiovascular: Negative for chest pain.  Gastrointestinal: Positive for nausea and vomiting.  Neurological: Positive for weakness, numbness and headaches. Negative for vertigo.  Psychiatric/Behavioral: Positive for altered mental status.    Allergies  Ibuprofen and Penicillins  Home Medications   Current Outpatient Rx  Name Route  Sig Dispense Refill  . DOCUSATE SODIUM 100 MG PO CAPS Oral Take 100 mg by mouth daily.     Marland Kitchen LEVOTHYROXINE SODIUM 50 MCG PO TABS Oral Take 50 mcg by mouth daily before breakfast.    . OMEPRAZOLE PO Oral Take 20 mg by mouth 2 (two) times daily.     . VENLAFAXINE HCL ER 150 MG PO CP24 Oral Take 150 mg by mouth daily.      Marland Kitchen ZOLPIDEM TARTRATE PO Oral Take 10 mg by mouth at bedtime.       BP 98/76  Pulse 98  Temp(Src) 97.8 F (36.6 C) (Oral)  Resp 16  SpO2 100%  Physical Exam  Constitutional: She is oriented to person, place, and time. She appears well-developed and well-nourished.  HENT:  Head: Normocephalic.  Neck: Normal range of motion.  Cardiovascular: Normal rate.   Pulmonary/Chest: Effort normal.   Abdominal: Soft.  Musculoskeletal: Normal range of motion.  Neurological: She is alert and oriented to person, place, and time. She has normal strength. No cranial nerve deficit or sensory deficit.       No decrease movement, strength L upper extremity  Skin: Skin is warm and dry.    ED Course  Procedures (including critical care time)   Labs Reviewed  URINALYSIS, ROUTINE W REFLEX MICROSCOPIC  CBC  DIFFERENTIAL  I-STAT, CHEM 8  TROPONIN I   No results found.   No diagnosis found.  ED ECG REPORT   Date: 09/24/2011  EKG Time: 7:51 PM  Rate: 92  Rhythm: normal sinus rhythm,  normal EKG, normal sinus rhythm, unchanged from previous tracings  Axis: normal  Intervals:PAV  ST&T Change:early R/S  Narrative Interpretation: other than PAC normal             MDM  Complex miagrain        Arman Filter, NP 09/24/11 1954

## 2011-09-24 NOTE — ED Notes (Signed)
EMS called to home.  Found patient sitting in chair.   Patient was slow to respond with altered mental status Patient was laid trendelenburg en route to hospital and all symptoms Resolved.  V/S stable.  20g left forearm saline lock.  CBG 113

## 2011-09-24 NOTE — ED Provider Notes (Signed)
Patient reports significant relief of headache with medication. Now reports headache is a 4-5 on a scale of 1/10 and. Patient relates that she is having more migraine headaches because she is having more hot flashes, which she states triggers her migraines. States her primary care physician does not seem interested in managing her migraines. Patient with multiple questions regarding strategies to deal with her hot flashes. Recommended patient follow up w/ her primary care physician initially. I then suggested pt might consider an evaluation at the [facility] for further evaluation of her hot flashes as she does not have a gynecologist. I have also recommended patient may want to consider the [facility] for further evaluation of her migraines. Will d/c home per plan of Volney American, FNP-C  Leanne Chang, NP 09/24/11 2124

## 2011-10-03 ENCOUNTER — Observation Stay (HOSPITAL_COMMUNITY)
Admission: EM | Admit: 2011-10-03 | Discharge: 2011-10-06 | Disposition: A | Discharge status: Home / Self Care | Payer: Medicaid Other | Attending: Cardiology | Admitting: Cardiology

## 2011-10-03 ENCOUNTER — Emergency Department (HOSPITAL_COMMUNITY): Payer: Medicaid Other

## 2011-10-03 ENCOUNTER — Other Ambulatory Visit: Payer: Self-pay

## 2011-10-03 ENCOUNTER — Encounter (HOSPITAL_COMMUNITY): Payer: Self-pay | Admitting: *Deleted

## 2011-10-03 DIAGNOSIS — E7800 Pure hypercholesterolemia, unspecified: Secondary | ICD-10-CM | POA: Insufficient documentation

## 2011-10-03 DIAGNOSIS — E039 Hypothyroidism, unspecified: Secondary | ICD-10-CM | POA: Insufficient documentation

## 2011-10-03 DIAGNOSIS — R188 Other ascites: Principal | ICD-10-CM | POA: Insufficient documentation

## 2011-10-03 DIAGNOSIS — K219 Gastro-esophageal reflux disease without esophagitis: Secondary | ICD-10-CM | POA: Insufficient documentation

## 2011-10-03 DIAGNOSIS — I2510 Atherosclerotic heart disease of native coronary artery without angina pectoris: Secondary | ICD-10-CM | POA: Insufficient documentation

## 2011-10-03 DIAGNOSIS — E2749 Other adrenocortical insufficiency: Secondary | ICD-10-CM | POA: Insufficient documentation

## 2011-10-03 DIAGNOSIS — G43909 Migraine, unspecified, not intractable, without status migrainosus: Secondary | ICD-10-CM | POA: Insufficient documentation

## 2011-10-03 DIAGNOSIS — IMO0002 Reserved for concepts with insufficient information to code with codable children: Secondary | ICD-10-CM | POA: Insufficient documentation

## 2011-10-03 LAB — APTT: aPTT: 41 seconds — ABNORMAL HIGH (ref 24–37)

## 2011-10-03 LAB — CARDIAC PANEL(CRET KIN+CKTOT+MB+TROPI)
CK, MB: 4.5 ng/mL — ABNORMAL HIGH (ref 0.3–4.0)
Relative Index: 3.1 — ABNORMAL HIGH (ref 0.0–2.5)
Total CK: 152 U/L (ref 7–177)

## 2011-10-03 LAB — DIFFERENTIAL
Basophils Absolute: 0 10*3/uL (ref 0.0–0.1)
Eosinophils Absolute: 0.3 10*3/uL (ref 0.0–0.7)
Eosinophils Absolute: 0.5 10*3/uL (ref 0.0–0.7)
Lymphocytes Relative: 71 % — ABNORMAL HIGH (ref 12–46)
Lymphs Abs: 4.7 10*3/uL — ABNORMAL HIGH (ref 0.7–4.0)
Lymphs Abs: 5.9 10*3/uL — ABNORMAL HIGH (ref 0.7–4.0)
Monocytes Relative: 8 % (ref 3–12)
Neutrophils Relative %: 18 % — ABNORMAL LOW (ref 43–77)

## 2011-10-03 LAB — COMPREHENSIVE METABOLIC PANEL
ALT: 13 U/L (ref 0–35)
AST: 22 U/L (ref 0–37)
Albumin: 3.3 g/dL — ABNORMAL LOW (ref 3.5–5.2)
CO2: 21 mEq/L (ref 19–32)
Calcium: 9.2 mg/dL (ref 8.4–10.5)
Chloride: 107 mEq/L (ref 96–112)
GFR calc non Af Amer: 74 mL/min — ABNORMAL LOW (ref >90)
Sodium: 138 mEq/L (ref 135–145)

## 2011-10-03 LAB — BASIC METABOLIC PANEL WITH GFR
BUN: 16 mg/dL (ref 6–23)
CO2: 27 meq/L (ref 19–32)
Calcium: 9.5 mg/dL (ref 8.4–10.5)
Chloride: 103 meq/L (ref 96–112)
Creatinine, Ser: 1.02 mg/dL (ref 0.50–1.10)
GFR calc Af Amer: 71 mL/min — ABNORMAL LOW
GFR calc non Af Amer: 62 mL/min — ABNORMAL LOW
Glucose, Bld: 91 mg/dL (ref 70–99)
Potassium: 3.8 meq/L (ref 3.5–5.1)
Sodium: 137 meq/L (ref 135–145)

## 2011-10-03 LAB — CBC
MCHC: 33.3 g/dL (ref 30.0–36.0)
MCV: 89.6 fL (ref 78.0–100.0)
Platelets: 290 10*3/uL (ref 150–400)
Platelets: 289 10*3/uL (ref 150–400)
RBC: 4.01 MIL/uL (ref 3.87–5.11)
RDW: 14.2 % (ref 11.5–15.5)
RDW: 14.2 % (ref 11.5–15.5)
WBC: 8.4 10*3/uL (ref 4.0–10.5)
WBC: 8.3 10*3/uL (ref 4.0–10.5)

## 2011-10-03 LAB — POCT I-STAT, CHEM 8
BUN: 20 mg/dL (ref 6–23)
Calcium, Ion: 1.25 mmol/L (ref 1.12–1.32)
Chloride: 106 meq/L (ref 96–112)
Creatinine, Ser: 1 mg/dL (ref 0.50–1.10)
Glucose, Bld: 92 mg/dL (ref 70–99)
HCT: 38 % (ref 36.0–46.0)
Hemoglobin: 12.9 g/dL (ref 12.0–15.0)
Potassium: 4.1 meq/L (ref 3.5–5.1)
Sodium: 141 meq/L (ref 135–145)
TCO2: 28 mmol/L (ref 0–100)

## 2011-10-03 LAB — PROTIME-INR
INR: 0.96 (ref 0.00–1.49)
INR: 1.05 (ref 0.00–1.49)
Prothrombin Time: 13 s (ref 11.6–15.2)
Prothrombin Time: 13.9 seconds (ref 11.6–15.2)

## 2011-10-03 LAB — POCT I-STAT TROPONIN I: Troponin i, poc: 0 ng/mL (ref 0.00–0.08)

## 2011-10-03 LAB — TSH: TSH: 1.173 u[IU]/mL (ref 0.350–4.500)

## 2011-10-03 LAB — D-DIMER, QUANTITATIVE: D-Dimer, Quant: 0.41 ug{FEU}/mL (ref 0.00–0.48)

## 2011-10-03 MED ORDER — LEVOTHYROXINE SODIUM 50 MCG PO TABS
50.0000 ug | ORAL_TABLET | Freq: Every day | ORAL | Status: DC
  Administered 2011-10-04 – 2011-10-06 (×3): 50 ug via ORAL
  Filled 2011-10-03 (×6): qty 1

## 2011-10-03 MED ORDER — HYDROCORTISONE 20 MG PO TABS
20.0000 mg | ORAL_TABLET | Freq: Every day | ORAL | Status: DC
  Administered 2011-10-03 – 2011-10-06 (×4): 20 mg via ORAL
  Filled 2011-10-03 (×5): qty 1

## 2011-10-03 MED ORDER — METOPROLOL TARTRATE 12.5 MG HALF TABLET: 12.5000 mg | ORAL_TABLET | Freq: Two times a day (BID) | ORAL | Status: DC

## 2011-10-03 MED ORDER — NITROGLYCERIN IN D5W 200-5 MCG/ML-% IV SOLN
5.0000 ug/min | Freq: Once | INTRAVENOUS | Status: AC
  Administered 2011-10-03: 5 ug/min via INTRAVENOUS
  Filled 2011-10-03: qty 250

## 2011-10-03 MED ORDER — ALPRAZOLAM 0.25 MG PO TABS
0.5000 mg | ORAL_TABLET | Freq: Every day | ORAL | Status: DC
  Administered 2011-10-03 – 2011-10-05 (×3): 0.5 mg via ORAL
  Filled 2011-10-03: qty 2
  Filled 2011-10-03: qty 1
  Filled 2011-10-03: qty 2
  Filled 2011-10-03: qty 1

## 2011-10-03 MED ORDER — NITROGLYCERIN IN D5W 200-5 MCG/ML-% IV SOLN: 5.0000 ug/min | INTRAVENOUS | Status: DC

## 2011-10-03 MED ORDER — NITROGLYCERIN IN D5W 200-5 MCG/ML-% IV SOLN
10.0000 ug/min | INTRAVENOUS | Status: DC
  Administered 2011-10-05: 02:00:00 10 ug/min via INTRAVENOUS
  Filled 2011-10-03: qty 250

## 2011-10-03 MED ORDER — VENLAFAXINE HCL ER 150 MG PO CP24
150.0000 mg | ORAL_CAPSULE | Freq: Every day | ORAL | Status: DC
  Administered 2011-10-04 – 2011-10-06 (×3): 150 mg via ORAL
  Filled 2011-10-03 (×6): qty 1

## 2011-10-03 MED ORDER — SODIUM CHLORIDE 0.9 % IV SOLN
Freq: Once | INTRAVENOUS | Status: AC
  Administered 2011-10-03: 02:00:00 via INTRAVENOUS

## 2011-10-03 MED ORDER — ACETAMINOPHEN 325 MG PO TABS
650.0000 mg | ORAL_TABLET | ORAL | Status: DC | PRN
  Administered 2011-10-03 – 2011-10-04 (×3): 650 mg via ORAL
  Filled 2011-10-03 (×3): qty 2

## 2011-10-03 MED ORDER — ROSUVASTATIN CALCIUM 10 MG PO TABS
10.0000 mg | ORAL_TABLET | Freq: Every day | ORAL | Status: DC
  Administered 2011-10-04 – 2011-10-06 (×3): 10 mg via ORAL
  Filled 2011-10-03 (×5): qty 1

## 2011-10-03 MED ORDER — METOPROLOL TARTRATE 25 MG PO TABS: 25.0000 mg | ORAL_TABLET | Freq: Two times a day (BID) | ORAL | Status: DC

## 2011-10-03 MED ORDER — HYDROCORTISONE 10 MG PO TABS
10.0000 mg | ORAL_TABLET | Freq: Every day | ORAL | Status: DC
  Administered 2011-10-03 – 2011-10-05 (×3): 10 mg via ORAL
  Filled 2011-10-03 (×5): qty 1

## 2011-10-03 MED ORDER — METOPROLOL TARTRATE 25 MG PO TABS
37.5000 mg | ORAL_TABLET | Freq: Two times a day (BID) | ORAL | Status: DC
  Administered 2011-10-03: 37.5 mg via ORAL
  Filled 2011-10-03 (×2): qty 1

## 2011-10-03 MED ORDER — ZOLPIDEM TARTRATE 5 MG PO TABS
10.0000 mg | ORAL_TABLET | Freq: Every day | ORAL | Status: DC
  Administered 2011-10-04 – 2011-10-06 (×2): 10 mg via ORAL
  Filled 2011-10-03: qty 2
  Filled 2011-10-03 (×2): qty 1
  Filled 2011-10-03: qty 2

## 2011-10-03 MED ORDER — NITROGLYCERIN IN D5W 200-5 MCG/ML-% IV SOLN: 5.0000 ug/min | Freq: Once | INTRAVENOUS | Status: DC

## 2011-10-03 MED ORDER — ONDANSETRON HCL 4 MG/2ML IJ SOLN: 4.0000 mg | Freq: Four times a day (QID) | INTRAMUSCULAR | Status: DC | PRN

## 2011-10-03 MED ORDER — METOPROLOL TARTRATE 12.5 MG HALF TABLET
12.5000 mg | ORAL_TABLET | Freq: Two times a day (BID) | ORAL | Status: DC
  Administered 2011-10-03 – 2011-10-06 (×4): 12.5 mg via ORAL
  Filled 2011-10-03 (×9): qty 1

## 2011-10-03 MED ORDER — ASPIRIN EC 81 MG PO TBEC
81.0000 mg | DELAYED_RELEASE_TABLET | Freq: Every day | ORAL | Status: DC
  Administered 2011-10-04 – 2011-10-06 (×3): 81 mg via ORAL
  Filled 2011-10-03 (×3): qty 1

## 2011-10-03 MED ORDER — HEPARIN (PORCINE) IN NACL 100-0.45 UNIT/ML-% IJ SOLN
1000.0000 [IU]/h | INTRAMUSCULAR | Status: AC
  Administered 2011-10-03: 1000 [IU]/h via INTRAVENOUS
  Filled 2011-10-03: qty 250

## 2011-10-03 MED ORDER — HEPARIN (PORCINE) IN NACL 100-0.45 UNIT/ML-% IJ SOLN
1000.0000 [IU]/h | INTRAMUSCULAR | Status: DC
  Filled 2011-10-03: qty 250

## 2011-10-03 MED ORDER — PANTOPRAZOLE SODIUM 40 MG PO TBEC
40.0000 mg | DELAYED_RELEASE_TABLET | Freq: Every day | ORAL | Status: DC
  Administered 2011-10-03 – 2011-10-06 (×4): 40 mg via ORAL
  Filled 2011-10-03 (×4): qty 1

## 2011-10-03 MED ORDER — DOCUSATE SODIUM 100 MG PO CAPS
100.0000 mg | ORAL_CAPSULE | Freq: Every day | ORAL | Status: DC
  Administered 2011-10-03 – 2011-10-05 (×2): 100 mg via ORAL
  Filled 2011-10-03 (×5): qty 1

## 2011-10-03 MED ORDER — ONDANSETRON HCL 4 MG/2ML IJ SOLN: 4.0000 mg | Freq: Three times a day (TID) | INTRAMUSCULAR | Status: AC | PRN

## 2011-10-03 MED ORDER — SODIUM CHLORIDE 0.9 % IV SOLN: INTRAVENOUS | Status: DC

## 2011-10-03 MED ORDER — NITROGLYCERIN 0.4 MG SL SUBL: 0.4000 mg | SUBLINGUAL_TABLET | SUBLINGUAL | Status: DC | PRN

## 2011-10-03 MED ORDER — HEPARIN BOLUS VIA INFUSION
4000.0000 [IU] | Freq: Once | INTRAVENOUS | Status: AC
  Administered 2011-10-03: 4000 [IU] via INTRAVENOUS
  Filled 2011-10-03: qty 4000

## 2011-10-03 MED ORDER — HYDROCORTISONE 10 MG PO TABS: 10.0000 mg | ORAL_TABLET | Freq: Two times a day (BID) | ORAL | Status: DC

## 2011-10-03 MED ORDER — ENOXAPARIN SODIUM 40 MG/0.4ML ~~LOC~~ SOLN
40.0000 mg | SUBCUTANEOUS | Status: DC
  Administered 2011-10-04 – 2011-10-05 (×2): 40 mg via SUBCUTANEOUS
  Filled 2011-10-03 (×5): qty 0.4

## 2011-10-03 MED ORDER — SODIUM CHLORIDE 0.9 % IV SOLN
INTRAVENOUS | Status: DC
  Administered 2011-10-03 – 2011-10-05 (×2): via INTRAVENOUS

## 2011-10-03 NOTE — ED Provider Notes (Signed)
History     CSN: [id number]  Arrival date & time 10/03/11  0158   First MD Initiated Contact with Patient 10/03/11 0158      Chief Complaint  Patient presents with  . 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 She presents with acute onset of sharp left-sided chest pain. This occurred approximately 30 minutes prior to arrival, has been persistent, worse with taking a big breath, seemed to relieve somewhat when she rolled onto her right shoulder. She denies associated nausea vomiting or diaphoresis. Symptoms are mild to moderate at this time, she has had no exertional symptoms recently and denies recent cardiac testing. She is followed by Dr. [name] with [facility].  She admits to having small amount of swelling in the right leg but states it is more than lateral part of the right foot and also much to light. She denies fevers, cough, back pain, abdominal pain, dysuria, diarrhea, headache.  Patient is a 56 y.o. female presenting with chest pain. The history is provided by the patient, medical records and the EMS personnel.  Chest Pain     Past Medical History  Diagnosis Date  . Thyroid disease   . Reflux   . 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 Blindness of left eye 1978  . Hyperlipemia   . Asthma   . Migraine   . Sinus headache   . Cushing's syndrome   . Blood transfusion   . Anemia   . Constipation   . Hemorrhoids   . Depression   . 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 Stomach ulcer     "from the hydrocortisone"    Past Surgical History  Procedure Date  . Adrenal gland surgery 1982    on hydrocortisone  . Atrial myxoma excision 1978    . Vaginal hysterectomy 2000    "partial; w/left ovary"  . Breast biopsy 07/1999    ductal tissue excision & bx right; bx only left breast  . Rotator cuff repair 05/2005    left  . Eye surgery   . Repaired cross eyed     "as a child"    No family history on file.  History  Substance Use Topics  . Smoking status: Never Smoker   . Smokeless tobacco: Never Used  . Alcohol Use: No    OB History    Grav Para Term Preterm Abortions TAB SAB Ect Mult Living                  Review of Systems  Cardiovascular: Positive for chest pain.  All other systems reviewed and are negative.    Allergies  Aspirin; Ibuprofen; and Penicillins  Home Medications   Current Outpatient Rx  Name Route Sig Dispense Refill  . ALPRAZOLAM 0.5 MG PO TABS Oral Take 0.5 mg by mouth at bedtime.    Marland Kitchen DOCUSATE SODIUM 100 MG PO CAPS Oral Take 100 mg by mouth daily.     Marland Kitchen HYDROCORTISONE 20 MG PO TABS Oral Take 10-20 mg by mouth 2 (two) times daily. Take 1 tablet every morning and 1/2 tablet every evening    . LEVOTHYROXINE SODIUM 50 MCG PO TABS Oral Take 50 mcg  by mouth daily before breakfast.    . OMEPRAZOLE 20 MG PO CPDR Oral Take 20 mg by mouth 2 (two) times daily.    . VENLAFAXINE HCL ER 150 MG PO CP24 Oral Take 150 mg by mouth daily.      Marland Kitchen ZOLPIDEM TARTRATE 10 MG PO TABS Oral Take 10 mg by mouth at bedtime.      BP 126/71  Pulse 86  Temp(Src) 97.9 F (36.6 C) (Oral)  Resp 15  SpO2 100%  Physical Exam  Nursing note and vitals reviewed. Constitutional: She appears well-developed and well-nourished. No distress.  HENT:  Head: Normocephalic and atraumatic.  Mouth/Throat: Oropharynx is clear and moist. No oropharyngeal exudate.  Eyes: Conjunctivae and EOM are normal. Pupils are equal, round, and reactive to light. Right eye exhibits no discharge. Left eye exhibits no discharge. No scleral icterus.  Neck: Normal range of motion. Neck supple. No JVD present. No thyromegaly present.   Cardiovascular: Normal rate, regular rhythm, normal heart sounds and intact distal pulses.  Exam reveals no gallop and no friction rub.   No murmur heard. Pulmonary/Chest: Effort normal and breath sounds normal. No respiratory distress. She has no wheezes. She has no rales.  Abdominal: Soft. Bowel sounds are normal. She exhibits no distension and no mass. There is no tenderness.  Musculoskeletal: Normal range of motion. She exhibits no edema and no tenderness.  Lymphadenopathy:    She has no cervical adenopathy.  Neurological: She is alert. Coordination normal.  Skin: Skin is warm and dry. No rash noted. No erythema.  Psychiatric: She has a normal mood and affect. Her behavior is normal.    ED Course  Procedures (including critical care time)  Labs Reviewed  DIFFERENTIAL - Abnormal; Notable for the following:    Neutrophils Relative 31 (*)    Lymphocytes Relative 56 (*)    Lymphs Abs 4.7 (*)    All other components within normal limits  BASIC METABOLIC PANEL - Abnormal; Notable for the following:    GFR calc non Af Amer 62 (*)    GFR calc Af Amer 71 (*)    All other components within normal limits  CBC  APTT  PROTIME-INR  D-DIMER, QUANTITATIVE  POCT I-STAT, CHEM 8  POCT I-STAT TROPONIN I  I-STAT, CHEM 8  I-STAT TROPONIN I   Dg Chest Port 1 View  10/03/2011  *RADIOLOGY REPORT*  Clinical Data: Left-sided chest pain.  PORTABLE CHEST - 1 VIEW  Comparison: Chest radiograph performed 08/02/2011  Findings: The lungs are mildly hypoexpanded but appear clear. There is no evidence of focal opacification, pleural effusion or pneumothorax.  The cardiomediastinal silhouette is mildly enlarged; the patient is status post median sternotomy.  Pacer wires are again noted overlying the mediastinum.  Postoperative change is seen at the upper abdomen.  No acute osseous abnormalities are seen.  IMPRESSION: Mildly hypoexpanded but clear lungs.  Original Report Authenticated By: Tonia Ghent, M.D.      1. Chest pain       MDM  Physical exam is unremarkable, EKG shows frequent PVCs ectopy but no signs of acute ischemia with ST segments or T waves.  We'll obtain a d-dimer to evaluate for PE, cardiac markers, labs, chest x-ray.  ED ECG REPORT   Date: 10/03/2011   Rate: 105  Rhythm: sinus tachycardia with frequent PVC's  QRS Axis: normal  Intervals: normal  ST/T Wave abnormalities: nonspecific T wave changes  Conduction Disutrbances:none  Narrative Interpretation:   Old EKG Reviewed: changes noted  and Since 09/24/2011 there appears to be more frequent ventricular ectopy, no changes in ST or T waves.    I have discussed the care with Dr. Sharyn Lull who agrees with admission to the hospital for further evaluation. According to my interpretation of the results, there is a normal d-dimer, normal coagulation studies, normal basic metabolic panel, troponin, CBC.  The chest x-ray does not show any acute findings.  After interpretation of these results I discussed the care with the patient's cardiologist on call this evening who has agreed to admit the patient and suggests both nitroglycerin via intravenous drip as well as heparin by intravenous drip. These have both been ordered. Patient still complains of mild pain. Admission orders placed at the request of the admitting physician for a step down bed  CRITICAL CARE Performed by: Vida Roller   Total critical care time: 35  Critical care time was exclusive of separately billable procedures and treating other patients.  Critical care was necessary to treat or prevent imminent or life-threatening deterioration.  Critical care was time spent personally by me on the following activities: development of treatment plan with patient and/or surrogate as well as nursing, discussions with consultants, evaluation of patient's response to treatment, examination of patient, obtaining history from patient or surrogate, ordering and performing  treatments and interventions, ordering and review of laboratory studies, ordering and review of radiographic studies, pulse oximetry and re-evaluation of patient's condition.   Vida Roller, MD 10/03/11 [phone]

## 2011-10-03 NOTE — ED Notes (Signed)
CXR at bedside

## 2011-10-03 NOTE — H&P (Signed)
[name] is an 56 y.o. female.   Chief Complaint: Chest pain HPI: Patient is 56 year old female with past medical history significant for multiple medical problems i.e. history of far atrial myxoma resection at the age of 59 history of CVA history of right renal insufficiency on chronic steroids hypothyroidism history of peptic ulcer disease GERD depression morbid obesity positive family history of coronary artery disease history of probable small non-Q-wave myocardial infarction in the past came to the ER by EMS complaining of left-sided and retrosternal chest pain described as sharp radiating to left arm lasted approximately 30 minutes associated with mild shortness of breath patient states chest pain increases with deep breathing and movement especially on the left side denies any nausea vomiting denies any weakness or lethargy denies any fever chills cough denies any palpitation lightheadedness or syncope denies any recent cardiac workup  Past Medical History  Diagnosis Date  . Thyroid disease   . Reflux   . 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 Blindness of left eye 1978  . Hyperlipemia   . Asthma   . Migraine   . Sinus headache   . Cushing's syndrome   . Blood transfusion   . Anemia   . Constipation   . Hemorrhoids   . Depression   . 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 Stomach ulcer     "from the hydrocortisone"    Past Surgical History  Procedure Date  . Adrenal gland surgery 1982    on hydrocortisone  . Atrial myxoma excision 1978  . Vaginal hysterectomy 2000    "partial; w/left ovary"  . Breast biopsy 07/1999    ductal tissue excision & bx right; bx only left breast  . Rotator cuff repair 05/2005    left  . Eye surgery   . Repaired cross eyed     "as a child"    No family history on file. Social History:  reports that she has never smoked. She has never used smokeless tobacco. She reports that she does not drink  alcohol or use illicit drugs.  Allergies:  Allergies  Allergen Reactions  . Aspirin   . Ibuprofen (Advil) Nausea And Vomiting  . Penicillins Nausea And Vomiting    Medications Prior to Admission  Medication Dose Route Frequency Provider Last Rate Last Dose  . 0.9 %  sodium chloride infusion   Intravenous Once Vida Roller, MD 75 mL/hr at 10/03/11 0229    . 0.9 %  sodium chloride infusion   Intravenous STAT Vida Roller, MD      . heparin ADULT infusion 100 units/mL (25000 units/250 mL)  1,000 Units/hr Intravenous To Major Vida Roller, MD 10 mL/hr at 10/03/11 0502 1,000 Units/hr at 10/03/11 0502  . heparin ADULT infusion 100 units/mL (25000 units/250 mL)  1,000 Units/hr Intravenous Continuous Vida Roller, MD      . heparin bolus via infusion 4,000 Units  4,000 Units Intravenous Once Vida Roller, MD   4,000 Units at 10/03/11 0505  . nitroGLYCERIN 0.2 mg/mL in dextrose 5 % infusion  5-200 mcg/min Intravenous Once Vida Roller, MD 1.5 mL/hr at 10/03/11 0458 5 mcg/min at 10/03/11 0458  . nitroGLYCERIN 0.2 mg/mL in dextrose 5 % infusion  5-200 mcg/min Intravenous Once Vida Roller, MD      . nitroGLYCERIN 0.2 mg/mL in dextrose 5 % infusion  5-200 mcg/min Intravenous  Continuous Robynn Pane, MD 15 mL/hr at 10/03/11 0936 50 mcg/min at 10/03/11 0936  . ondansetron (ZOFRAN) injection 4 mg  4 mg Intravenous Q8H PRN Vida Roller, MD       Medications Prior to Admission  Medication Sig Dispense Refill  . docusate sodium (COLACE) 100 MG capsule Take 100 mg by mouth daily.       Marland Kitchen levothyroxine (SYNTHROID, LEVOTHROID) 50 MCG tablet Take 50 mcg by mouth daily before breakfast.      . venlafaxine (EFFEXOR-XR) 150 MG 24 hr capsule Take 150 mg by mouth daily.          Results for orders placed during the hospital encounter of 10/03/11 (from the past 48 hour(s))  CBC     Status: Normal   Collection Time   10/03/11  2:26 AM      Component Value Range Comment   WBC 8.4  4.0 - 10.5  (K/uL)    RBC 4.12  3.87 - 5.11 (MIL/uL)    Hemoglobin 12.3  12.0 - 15.0 (g/dL)    HCT 16.1  09.6 - 04.5 (%)    MCV 89.6  78.0 - 100.0 (fL)    MCH 29.9  26.0 - 34.0 (pg)    MCHC 33.3  30.0 - 36.0 (g/dL)    RDW 40.9  81.1 - 91.4 (%)    Platelets 290  150 - 400 (K/uL)   DIFFERENTIAL     Status: Abnormal   Collection Time   10/03/11  2:26 AM      Component Value Range Comment   Neutrophils Relative 31 (*) 43 - 77 (%)    Neutro Abs 2.6  1.7 - 7.7 (K/uL)    Lymphocytes Relative 56 (*) 12 - 46 (%)    Lymphs Abs 4.7 (*) 0.7 - 4.0 (K/uL)    Monocytes Relative 8  3 - 12 (%)    Monocytes Absolute 0.7  0.1 - 1.0 (K/uL)    Eosinophils Relative 4  0 - 5 (%)    Eosinophils Absolute 0.3  0.0 - 0.7 (K/uL)    Basophils Relative 0  0 - 1 (%)    Basophils Absolute 0.0  0.0 - 0.1 (K/uL)   BASIC METABOLIC PANEL     Status: Abnormal   Collection Time   10/03/11  2:26 AM      Component Value Range Comment   Sodium 137  135 - 145 (mEq/L)    Potassium 3.8  3.5 - 5.1 (mEq/L)    Chloride 103  96 - 112 (mEq/L)    CO2 27  19 - 32 (mEq/L)    Glucose, Bld 91  70 - 99 (mg/dL)    BUN 16  6 - 23 (mg/dL)    Creatinine, Ser 7.82  0.50 - 1.10 (mg/dL)    Calcium 9.5  8.4 - 10.5 (mg/dL)    GFR calc non Af Amer 62 (*) >90 (mL/min)    GFR calc Af Amer 71 (*) >90 (mL/min)   APTT     Status: Normal   Collection Time   10/03/11  2:26 AM      Component Value Range Comment   aPTT 25  24 - 37 (seconds)   PROTIME-INR     Status: Normal   Collection Time   10/03/11  2:26 AM      Component Value Range Comment   Prothrombin Time 13.0  11.6 - 15.2 (seconds)    INR 0.96  0.00 - 1.49  D-DIMER, QUANTITATIVE     Status: Normal   Collection Time   10/03/11  2:26 AM      Component Value Range Comment   D-Dimer, Quant 0.41  0.00 - 0.48 (ug/mL-FEU)   POCT I-STAT, CHEM 8     Status: Normal   Collection Time   10/03/11  2:33 AM      Component Value Range Comment   Sodium 141  135 - 145 (mEq/L)    Potassium 4.1  3.5 - 5.1  (mEq/L)    Chloride 106  96 - 112 (mEq/L)    BUN 20  6 - 23 (mg/dL)    Creatinine, Ser 1.61  0.50 - 1.10 (mg/dL)    Glucose, Bld 92  70 - 99 (mg/dL)    Calcium, Ion 0.96  1.12 - 1.32 (mmol/L)    TCO2 28  0 - 100 (mmol/L)    Hemoglobin 12.9  12.0 - 15.0 (g/dL)    HCT 04.5  40.9 - 81.1 (%)   POCT I-STAT TROPONIN I     Status: Normal   Collection Time   10/03/11  2:33 AM      Component Value Range Comment   Troponin i, poc 0.00  0.00 - 0.08 (ng/mL)    Comment 3            MRSA PCR SCREENING     Status: Normal   Collection Time   10/03/11  6:20 AM      Component Value Range Comment   MRSA by PCR NEGATIVE  NEGATIVE     Dg Chest Port 1 View  10/03/2011  *RADIOLOGY REPORT*  Clinical Data: Left-sided chest pain.  PORTABLE CHEST - 1 VIEW  Comparison: Chest radiograph performed 08/02/2011  Findings: The lungs are mildly hypoexpanded but appear clear. There is no evidence of focal opacification, pleural effusion or pneumothorax.  The cardiomediastinal silhouette is mildly enlarged; the patient is status post median sternotomy.  Pacer wires are again noted overlying the mediastinum.  Postoperative change is seen at the upper abdomen.  No acute osseous abnormalities are seen.  IMPRESSION: Mildly hypoexpanded but clear lungs.  Original Report Authenticated By: Tonia Ghent, M.D.    Review of Systems  Constitutional: Positive for malaise/fatigue. Negative for fever, chills and weight loss.  Eyes: Negative for discharge and redness.  Respiratory: Negative for cough, hemoptysis and sputum production.   Cardiovascular: Positive for chest pain and claudication. Negative for palpitations, orthopnea and leg swelling.  Gastrointestinal: Negative for heartburn, nausea, vomiting and abdominal pain.  Musculoskeletal: Positive for joint pain.  Neurological: Positive for headaches. Negative for dizziness, tingling and weakness.    Blood pressure 103/57, pulse 94, temperature 98.2 F (36.8 C), temperature  source Oral, resp. rate 17, height 5' (1.524 m), weight 86.1 kg (189 lb 13.1 oz), SpO2 94.00%. Physical Exam  Constitutional: She is oriented to person, place, and time. She appears well-developed.  HENT:  Head: Normocephalic.  Eyes: Conjunctivae are normal. Left eye exhibits no discharge. Scleral icterus is present.  Neck: Normal range of motion. Neck supple. No JVD present. No thyromegaly present.  Cardiovascular: Normal rate, regular rhythm and normal heart sounds.        Soft systolic murmur noted at lower left sternal border  Respiratory: Breath sounds normal. No respiratory distress. She has no wheezes. She has no rales.  GI: Soft. Bowel sounds are normal. She exhibits no distension. There is no tenderness.  Musculoskeletal: She exhibits no edema and no tenderness.  Lymphadenopathy:  She has no cervical adenopathy.  Neurological: She is alert and oriented to person, place, and time.     Assessment/Plan Atypical chest pain rule out MI History of questionable small non-Q-wave myocardial infarction in the past History of atrial myxoma resection in the past And renal insufficiency on chronic steroids History of peptic ulcer disease Hypothyroidism GERD Depression Morbid obesity Hypercholesteremia History of migraine headaches Plan As per orders Scheduled for nuclear stress test in a.m. Restart home meds  Meshelle Holness N 10/03/2011, 11:09 AM

## 2011-10-03 NOTE — ED Notes (Signed)
Substernal chest pain radiating to left shoulder, sharp, onset at rest 6/10, Unable to get iv, no meds given-pt allergic to aspirin.

## 2011-10-03 NOTE — ED Notes (Signed)
New and Old EKG to DR Hyacinth Meeker, New EKG completed upon arriving via EMS.

## 2011-10-04 ENCOUNTER — Inpatient Hospital Stay (HOSPITAL_COMMUNITY): Payer: Medicaid Other

## 2011-10-04 ENCOUNTER — Other Ambulatory Visit: Payer: Self-pay

## 2011-10-04 LAB — LIPID PANEL
Cholesterol: 189 mg/dL (ref 0–200)
LDL Cholesterol: 107 mg/dL — ABNORMAL HIGH (ref 0–99)
Triglycerides: 90 mg/dL (ref <150)

## 2011-10-04 LAB — BASIC METABOLIC PANEL
BUN: 12 mg/dL (ref 6–23)
CO2: 25 mEq/L (ref 19–32)
Calcium: 9.8 mg/dL (ref 8.4–10.5)
Chloride: 106 mEq/L (ref 96–112)
Creatinine, Ser: 0.76 mg/dL (ref 0.50–1.10)
Glucose, Bld: 86 mg/dL (ref 70–99)

## 2011-10-04 LAB — CBC
HCT: 36.1 % (ref 36.0–46.0)
MCH: 29.3 pg (ref 26.0–34.0)
MCV: 88.3 fL (ref 78.0–100.0)
Platelets: 284 10*3/uL (ref 150–400)
RBC: 4.09 MIL/uL (ref 3.87–5.11)
WBC: 6.8 10*3/uL (ref 4.0–10.5)

## 2011-10-04 MED ORDER — ASPIRIN 81 MG PO CHEW
CHEWABLE_TABLET | ORAL | Status: AC
  Administered 2011-10-04: 81 mg
  Filled 2011-10-04: qty 1

## 2011-10-04 MED ORDER — SODIUM CHLORIDE 0.9 % IJ SOLN
10.0000 mL | INTRAMUSCULAR | Status: DC | PRN
  Administered 2011-10-04 – 2011-10-06 (×3): 10 mL

## 2011-10-04 NOTE — Progress Notes (Signed)
Subjective:  Patient denies any further chest pain nuclear stress test could not be done because of no IV access today patient will be getting PICC line today and is rescheduled for stress test tomorrow Her cardiac enzymes remained normal  Objective:  Vital Signs in the last 24 hours: Temp:  [97.4 F (36.3 C)-98.2 F (36.8 C)] 97.5 F (36.4 C) (01/23 0332) Pulse Rate:  [60-93] 60  (01/23 0332) Resp:  [12-25] 12  (01/23 0332) BP: (98-120)/(53-72) 100/59 mmHg (01/23 0332) SpO2:  [98 %-100 %] 100 % (01/23 0332) Weight:  [85.6 kg (188 lb 11.4 oz)] 85.6 kg (188 lb 11.4 oz) (01/23 0332)  Intake/Output from previous day: 01/22 0701 - 01/23 0700 In: 1581 [P.O.:960; I.V.:621] Out: 1750 [Urine:1750] Intake/Output from this shift:    Physical Exam: General appearance: alert and cooperative Neck: no carotid bruit, no JVD and supple, symmetrical, trachea midline Lungs: clear to auscultation bilaterally Heart: regular rate and rhythm, S1, S2 normal and Soft systolic murmur noted no S3 gallop Abdomen: soft, non-tender; bowel sounds normal; no masses,  no organomegaly Extremities: extremities normal, atraumatic, no cyanosis or edema  Lab Results:  Basename 10/04/11 0625 10/03/11 1255  WBC 6.8 8.3  HGB 12.0 11.9*  PLT 284 289    Basename 10/04/11 0625 10/03/11 1255  NA 140 138  K 3.9 3.6  CL 106 107  CO2 25 21  GLUCOSE 86 82  BUN 12 13  CREATININE 0.76 0.88    Basename 10/03/11 1820 10/03/11 1255  TROPONINI <0.30 <0.30   Hepatic Function Panel  Basename 10/03/11 1255  PROT 6.5  ALBUMIN 3.3*  AST 22  ALT 13  ALKPHOS 56  BILITOT 0.3  BILIDIR --  IBILI --    Basename 10/04/11 0625  CHOL 189   No results found for this basename: PROTIME in the last 72 hours  Imaging: Dg Chest Port 1 View  10/03/2011  *RADIOLOGY REPORT*  Clinical Data: Left-sided chest pain.  PORTABLE CHEST - 1 VIEW  Comparison: Chest radiograph performed 08/02/2011  Findings: The lungs are mildly  hypoexpanded but appear clear. There is no evidence of focal opacification, pleural effusion or pneumothorax.  The cardiomediastinal silhouette is mildly enlarged; the patient is status post median sternotomy.  Pacer wires are again noted overlying the mediastinum.  Postoperative change is seen at the upper abdomen.  No acute osseous abnormalities are seen.  IMPRESSION: Mildly hypoexpanded but clear lungs.  Original Report Authenticated By: Tonia Ghent, M.D.    Cardiac Studies:  Assessment/Plan:  Status post chest pain MI ruled out Hypertension History of peptic ulcer disease GERD Hypothyroidism Adrenal insufficiency Morbid obesity Plan Continue present management Patient is scheduled for nuclear stress test   LOS: 1 day    Dominique Calvey N 10/04/2011, 1:29 PM

## 2011-10-04 NOTE — Progress Notes (Signed)
Pt sent to nuclear med via wheelchair.  Disconnected NTG GTT per nuc med. Pt w/o compaints of cp.

## 2011-10-05 ENCOUNTER — Inpatient Hospital Stay (HOSPITAL_COMMUNITY): Payer: Medicaid Other

## 2011-10-05 MED ORDER — REGADENOSON 0.4 MG/5ML IV SOLN
0.4000 mg | Freq: Once | INTRAVENOUS | Status: AC
  Administered 2011-10-05: 10:00:00 0.4 mg via INTRAVENOUS

## 2011-10-05 MED ORDER — SODIUM CHLORIDE 0.9 % IJ SOLN: 3.0000 mL | INTRAMUSCULAR | Status: DC | PRN

## 2011-10-05 MED ORDER — DIAZEPAM 5 MG PO TABS
5.0000 mg | ORAL_TABLET | ORAL | Status: AC
  Administered 2011-10-06: 07:00:00 5 mg via ORAL
  Filled 2011-10-05: qty 1

## 2011-10-05 MED ORDER — SODIUM CHLORIDE 0.9 % IV SOLN: 250.0000 mL | INTRAVENOUS | Status: DC | PRN

## 2011-10-05 MED ORDER — TECHNETIUM TC 99M TETROFOSMIN IV KIT
10.0000 | PACK | Freq: Once | INTRAVENOUS | Status: AC | PRN
  Administered 2011-10-05: 10 via INTRAVENOUS

## 2011-10-05 MED ORDER — ASPIRIN 81 MG PO CHEW
324.0000 mg | CHEWABLE_TABLET | ORAL | Status: AC
  Administered 2011-10-06: 324 mg via ORAL
  Filled 2011-10-05: qty 4

## 2011-10-05 MED ORDER — SODIUM CHLORIDE 0.9 % IV SOLN: 1.0000 mL/kg/h | INTRAVENOUS | Status: DC

## 2011-10-05 MED ORDER — TECHNETIUM TC 99M TETROFOSMIN IV KIT
30.0000 | PACK | Freq: Once | INTRAVENOUS | Status: AC | PRN
  Administered 2011-10-05: 30 via INTRAVENOUS

## 2011-10-05 MED ORDER — SODIUM CHLORIDE 0.9 % IJ SOLN: 3.0000 mL | Freq: Two times a day (BID) | INTRAMUSCULAR | Status: DC

## 2011-10-05 NOTE — Progress Notes (Signed)
Subjective:  Complains of vague chest pain off and on without associated symptoms Patient underwent nuclear stress test today which showed possible distal antero-apical wall ischemia with normal EF Discussed with patient various options of treatment i.e. medical versus invasive left cath possible PCI its risk and benefits and consents for PCI  Objective:  Vital Signs in the last 24 hours: Temp:  [97 F (36.1 C)-98.4 F (36.9 C)] 97.9 F (36.6 C) (01/24 1953) Pulse Rate:  [73-102] 83  (01/24 1953) Resp:  [12-22] 18  (01/24 1953) BP: (79-119)/(47-85) 105/60 mmHg (01/24 1953) SpO2:  [98 %-100 %] 100 % (01/24 1953)  Intake/Output from previous day: 01/23 0701 - 01/24 0700 In: 890 [P.O.:240; I.V.:650] Out: 800 [Urine:800] Intake/Output from this shift:    Physical Exam: General appearance: alert and cooperative Neck: no carotid bruit, no JVD and supple, symmetrical, trachea midline Lungs: clear to auscultation bilaterally Heart: regular rate and rhythm, S1, S2 normal, no murmur, click, rub or gallop Abdomen: soft, non-tender; bowel sounds normal; no masses,  no organomegaly Extremities: extremities normal, atraumatic, no cyanosis or edema Pulses: 2+ and symmetric  Lab Results:  Basename 10/04/11 0625 10/03/11 1255  WBC 6.8 8.3  HGB 12.0 11.9*  PLT 284 289    Basename 10/04/11 0625 10/03/11 1255  NA 140 138  K 3.9 3.6  CL 106 107  CO2 25 21  GLUCOSE 86 82  BUN 12 13  CREATININE 0.76 0.88    Basename 10/03/11 1820 10/03/11 1255  TROPONINI <0.30 <0.30   Hepatic Function Panel  Basename 10/03/11 1255  PROT 6.5  ALBUMIN 3.3*  AST 22  ALT 13  ALKPHOS 56  BILITOT 0.3  BILIDIR --  IBILI --    Basename 10/04/11 0625  CHOL 189   No results found for this basename: PROTIME in the last 72 hours  Imaging: Nm Myocar Multi W/spect W/wall Motion / Ef  10/05/2011  *RADIOLOGY REPORT*  Clinical Data:  Chest pain.  MYOCARDIAL IMAGING WITH SPECT (REST AND  PHARMACOLOGIC-STRESS) GATED LEFT VENTRICULAR WALL MOTION STUDY LEFT VENTRICULAR EJECTION FRACTION  Technique:  Standard myocardial SPECT imaging was performed after resting intravenous injection of 10 mCi Tc-44m tetrofosmin. Subsequently, intravenous infusion of Lexiscan was performed under the supervision of the Cardiology staff.  At peak effect of the drug, 30 mCi Tc-19m tetrofosmin was injected intravenously and standard myocardial SPECT  imaging was performed.  Quantitative gated imaging was also performed to evaluate left ventricular wall motion, and estimate left ventricular ejection fraction.  Comparison:  None.  Findings: Exam is technically suboptimal due to patient motion artifact, habitus, and spleen and activity.  Myocardial SPECT during pharmacologic stress shows decreased myocardial activity in the inferior and anteroapical walls.  On the resting SPECT, there appears to be reversibility in the distal anteroapical wall, although the mid and distal inferior wall defect appears fixed. This may be due to a combination of diaphragmatic attenuation and patient motion artifact.  The gated left wall motion study shows no definite regional left ventricle wall motion abnormality.  Calculated left ventricular ejection fraction is 66%.  IMPRESSION:  1.  Technically suboptimal exam, as described above. Possible inducible myocardial ischemia in the distal anteroapical wall. 2.  Calculated left ventricular ejection fraction of 66%.  Original Report Authenticated By: Danae Orleans, M.D.    Cardiac Studies:  Assessment/Plan:  Recurrent chest pain with mildly positive nuclear stress test rule out coronary insufficiency  Hypertension Hypothyroidism Adrenal insufficiency History of peptic ulcer disease GERD Plan Continue  present management Schedule for cath possible PCI in a.m.  LOS: 2 days    Joshuwa Vecchio N 10/05/2011, 8:50 PM

## 2011-10-06 ENCOUNTER — Encounter (HOSPITAL_COMMUNITY)
Admission: EM | Disposition: A | Discharge status: Home / Self Care | Payer: Self-pay | Source: Home / Self Care | Attending: Emergency Medicine

## 2011-10-06 HISTORY — PX: LEFT HEART CATHETERIZATION WITH CORONARY ANGIOGRAM: SHX5451

## 2011-10-06 LAB — BASIC METABOLIC PANEL
CO2: 29 mEq/L (ref 19–32)
Calcium: 9.5 mg/dL (ref 8.4–10.5)
GFR calc Af Amer: 88 mL/min — ABNORMAL LOW (ref >90)
Sodium: 140 mEq/L (ref 135–145)

## 2011-10-06 LAB — POCT ACTIVATED CLOTTING TIME: Activated Clotting Time: 127 seconds

## 2011-10-06 SURGERY — LEFT HEART CATHETERIZATION WITH CORONARY ANGIOGRAM
Anesthesia: LOCAL

## 2011-10-06 MED ORDER — HYDROCORTISONE 20 MG PO TABS: 20.0000 mg | ORAL_TABLET | Freq: Every day | ORAL | Status: AC

## 2011-10-06 MED ORDER — HEPARIN (PORCINE) IN NACL 2-0.9 UNIT/ML-% IJ SOLN
INTRAMUSCULAR | Status: AC
  Filled 2011-10-06: qty 2000

## 2011-10-06 MED ORDER — ASPIRIN 81 MG PO TBEC: 81.0000 mg | DELAYED_RELEASE_TABLET | Freq: Every day | ORAL | Status: AC

## 2011-10-06 MED ORDER — ROSUVASTATIN CALCIUM 10 MG PO TABS: 10.0000 mg | ORAL_TABLET | Freq: Every day | ORAL | Status: DC

## 2011-10-06 MED ORDER — MIDAZOLAM HCL 2 MG/2ML IJ SOLN
INTRAMUSCULAR | Status: AC
  Filled 2011-10-06: qty 2

## 2011-10-06 MED ORDER — ONDANSETRON HCL 4 MG/2ML IJ SOLN: 4.0000 mg | Freq: Four times a day (QID) | INTRAMUSCULAR | Status: DC | PRN

## 2011-10-06 MED ORDER — ACETAMINOPHEN 325 MG PO TABS: 650.0000 mg | ORAL_TABLET | ORAL | Status: DC | PRN

## 2011-10-06 MED ORDER — SODIUM CHLORIDE 0.9 % IV SOLN: INTRAVENOUS | Status: AC

## 2011-10-06 MED ORDER — OXYCODONE-ACETAMINOPHEN 5-325 MG PO TABS: 1.0000 | ORAL_TABLET | ORAL | Status: DC | PRN

## 2011-10-06 MED ORDER — LIDOCAINE HCL (PF) 1 % IJ SOLN
INTRAMUSCULAR | Status: AC
  Filled 2011-10-06: qty 30

## 2011-10-06 MED ORDER — FENTANYL CITRATE 0.05 MG/ML IJ SOLN
INTRAMUSCULAR | Status: AC
  Filled 2011-10-06: qty 2

## 2011-10-06 MED ORDER — METOPROLOL TARTRATE 12.5 MG HALF TABLET: 12.5000 mg | ORAL_TABLET | Freq: Two times a day (BID) | ORAL | Status: DC

## 2011-10-06 MED ORDER — HYDROCORTISONE 10 MG PO TABS: 10.0000 mg | ORAL_TABLET | Freq: Every day | ORAL | Status: AC

## 2011-10-06 NOTE — Op Note (Signed)
Cardiac cath report dictated on 10/06/2011 dictation number is [id number]

## 2011-10-06 NOTE — Progress Notes (Signed)
Per MD order, PICC line removed. Cath intact at 40cm. Vaseline pressure gauze to site, pressure held x 5min. No bleeding to site. Pt instructed to keep dressing CDI x 24 hours. Avoid heavy lifting, pushing or pulling x 24 hours,  If bleeding occurs hold pressure, if bleeding does not stop contact MD or go to the ED. Pt does not have any questions. Shamere Dilworth M 

## 2011-10-06 NOTE — H&P (Signed)
  No change in H&P exam unchanged

## 2011-10-06 NOTE — Cardiovascular Report (Signed)
NAMETIFFINI, BLACKSHER NO.:  [id number]  MEDICAL RECORD NO.:  [id number]  LOCATION:  [facility]                         FACILITY:  [facility]  PHYSICIAN:  Elisa Sorlie N. Sharyn Lull, M.D. DATE OF BIRTH:  October 12, 1955  DATE OF PROCEDURE:  10/06/2011 DATE OF DISCHARGE:                           CARDIAC CATHETERIZATION   PROCEDURE:  Left cardiac catheterization with selective left and right coronary angiography, left ventriculography via right groin using Judkins technique.  INDICATION FOR THE PROCEDURE:  Ms. 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  She came to the ER by EMS complaining of left-sided and retrosternal chest pain described as sharp radiating to the left arm, lasted approximately 30 minutes associated with mild shortness of breath.  The patient states chest pain increases also occasionally with deep breathing and movements especially on the left side.  Denies any nausea, vomiting.  Denies any weakness or lethargy. Denies any fever, chills, cough.  Denies any palpitation, lightheadedness or syncope.  The patient denies any recent cardiac workup.  The patient was admitted to telemetry unit.  MI was ruled out by serial enzymes and EKG.  The patient subsequently underwent a Lexiscan Myoview stress test yesterday which showed possible inducible ischemia in the distal anteroapical wall with EF of 66%.  Due to recurrent chest pain, multiple risk factors, and mildly positive stress test, discussed with the patient regarding left cath, possible PTCA and stenting, its risks and benefits, i.e.,  death, MI, stroke, need for emergency CABG, local vascular complications, etc. and consented for the procedure.  PROCEDURE:  After obtaining the informed consent, the patient was brought to the cath lab and was placed on fluoroscopy table.  Right groin was prepped and draped in usual fashion.  Xylocaine 1% was used for local anesthesia in the right groin.  With the help of thin wall needle, a 6-French arterial sheath was placed.  The sheath was aspirated and flushed.  Next, 6-French left Judkins catheter was advanced over the wire under fluoroscopic guidance up to the ascending aorta.  Wire was pulled out, the catheter was aspirated and connected to the Manifold. Catheter was further advanced and engaged into left coronary ostium. Multiple views of the left system were taken.  Next, the catheter was disengaged and was pulled out over the wire and was replaced with 6- [place] right Judkins catheter, which was advanced over the wire under fluoroscopic guidance up to the ascending aorta.  Wire was pulled out. The catheter was aspirated and connected to the Manifold.  Catheter was further advanced and engaged into right coronary ostium.  Multiple views of the right system were taken.  Next, the catheter was disengaged and was pulled out over the wire and was replaced with 6-French pigtail catheter which was advanced over the wire under fluoroscopic guidance up to the ascending aorta.  Catheter was further advanced across the aortic valve into the LV.  LV pressures were recorded.  Next, LV graphy was done in 30 degree RAO position.  Post angiographic pressures were recorded from LV and then pullback pressures were recorded from aorta. There was no significant gradient across the aortic valve.  Next, the pigtail catheter was pulled out over the wire.  Sheaths were aspirated and flushed.  FINDINGS:  LV showed good LV systolic function, mild LVH, EF of 50-55%. Left main was short which was  patent.  LAD was patent.  Diagonal 1 was very, very small which was patent.  Diagonal 2 was small which was patent.  Diagonal 3 was very, very small.  Left circumflex was large which was patent.  OM 1 was very, very small. OM 2 was moderate size which was patent.  OM 3 and OM 4 were very small which were patent.  RCA was patent.  PDA was small which was patent. The patient has codominant coronary system.  The patient tolerated the procedure well.  There were no complications.  The patient was transferred to recovery room in stable condition.     Eduardo Osier. Sharyn Lull, M.D.     M[facility]/MEDQ  D:  10/06/2011  T:  10/06/2011  Job:  [id number]

## 2011-10-06 NOTE — Discharge Summary (Signed)
NAMETECLA, MAILLOUX NO.:  [id number]  MEDICAL RECORD NO.:  [id number]  LOCATION:  6531                         FACILITY:  [facility]  PHYSICIAN:  Eduardo Osier. Sharyn Lull, M.D. DATE OF BIRTH:  08/02/1956  DATE OF ADMISSION:  10/03/2011 DATE OF DISCHARGE:  10/06/2011                              DISCHARGE SUMMARY   ADMITTING DIAGNOSES: 1. Atypical chest pain, rule out myocardial infarction. 2. History of questionable small non-Q-wave myocardial infarction in     the past. 3. History of atrial myxoma resection in the past. 4. Adrenal insufficiency, on chronic steroids. 5. History of peptic ulcer disease. 6. Hypothyroidism. 7. Gastroesophageal reflux disease. 8. Depression. 9. Morbid obesity. 10.Hypercholesteremia. 11.History of migraine headaches.  FINAL DIAGNOSES: 1. Status post atypical chest pain, myocardial infarction ruled out,     mildly positive Lexiscan and Myoview stress test, status post left     cardiac catheterization. 2. Mild coronary artery disease. 3. History of questionable small non-Q-wave myocardial infarction in     the past. 4. History of atrial myxoma resection in the past. 5. Adrenal insufficiency, on chronic steroids. 6. History of peptic ulcer disease. 7. Hypothyroidism. 8. Gastroesophageal reflux disease. 9. Depression. 10.Morbid obesity. 11.Hypercholesteremia. 12.History of migraine headaches.  DISCHARGE HOME MEDICATIONS: 1. Enteric-coated aspirin 81 mg 1 tablet daily. 2. Hydrocortisone 20 mg tablet in the morning and 10 mg daily at     night. 3. Metoprolol tartrate 12.5 mg twice daily. 4. Crestor 10 mg 1 tablet daily. 5. Xanax 0.5 mg daily at bedtime. 6. Colace 100 mg by mouth as before. 7. Levothyroxine 15 mcg daily. 8. Omeprazole 20 mg 1 capsule daily. 9. Effexor XR 150 mg by mouth daily. 10.Ambien 10 mg 1 tablet daily.  DIET:  Low salt, low cholesterol.  ACTIVITY:  Increase activity slowly as tolerated.  Post  cardiac catheterization instructions have been given.  FOLLOWUP:  With me in 1 week.  CONDITION ON DISCHARGE:  Stable.  BRIEF HISTORY:  Ms. 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  She came to the ER by EMS complaining of left-sided and retrosternal chest pain described as sharp, radiating to left arm, lasting approximately 30 minutes, associated with mild shortness of breath.  The patient states chest pain increases with deep breathing and movement, especially on the left side. Denies any nausea or vomiting.  Denies any weakness or lethargy.  Denies any fever, chills, cough.  Denies any palpitation, lightheadedness, or syncope.  Denies any recent cardiac workup.  PAST MEDICAL HISTORY:  As above.  PAST SURGICAL HISTORY:  She had adrenal gland surgery in 1982, had atrial myxoma excision in 1978, had vaginal hysterectomy in 2000, had breast biopsy in 2000, had rotator cuff repair in 2006, and repair of nystagmus as a child.  SOCIAL HISTORY:  No history of smoking or alcohol abuse.  ALLERGIES:  She has intolerance to ASPIRIN, IBUPROFEN, and PENICILLIN.  PHYSICAL EXAMINATION:  GENERAL:  She was alert, awake, oriented x3. VITAL SIGNS:  Blood pressure was 103/57, pulse was 94. EYES:  Conjunctivae was pink. NECK:  Supple.  No JVD.  No bruit. LUNGS:  Clear to auscultation without rhonchi or rales. CARDIOVASCULAR:  S1, S2 was normal.  There was soft systolic murmur noted at left lower sternal border. ABDOMEN:  Soft.  Bowel sounds were present.  Nontender. EXTREMITIES:  No clubbing, cyanosis, or edema. NEURO:   Grossly intact.  LABORATORY DATA:  Sodium was 138, potassium 3.6, BUN 13, creatinine 0.88.  Her 2 sets of cardiac enzymes were negative.  Cholesterol was 189, LDL 107, HDL 64, triglycerides  were 90.  Hemoglobin was 11.9, hematocrit 35.6, white count 8.3.  TSH was 1.17.  Lexiscan Myoview showed a possible inducible ischemia in the distal anteroapical wall.  EF of 66%.  EKG showed normal sinus rhythm with nonspecific T-wave changes.  Repeat EKGs showed normal sinus rhythm with PVCs, early precordial transition, nonspecific ST-T wave changes, and minimal ST elevation in inferior leads.  BRIEF HOSPITAL COURSE:  The patient was admitted to telemetry unit.  MI was ruled out by serial enzymes and EKG.  The patient subsequently underwent Lexiscan Myoview, which showed questionable ischemia in the distal anteroapical wall with EF of 61%.  The patient subsequently underwent left cardiac catheterization with selective left and right coronary angiography as per procedure report.  The patient tolerated the procedure well.  There were no complications.  Postprocedure, the patient did not have any episodes of chest pain.  Her groin is stable with no evidence of hematoma or bruit.  The patient will be discharged home on above medications and will be followed up in my office in 1 week.     Eduardo Osier. Sharyn Lull, M.D.     MNH/MEDQ  D:  10/06/2011  T:  10/06/2011  Job:  [id number]

## 2011-10-06 NOTE — Progress Notes (Signed)
Pt cath site unremarkable, up ambulating without problem. Ready to go home. D/c instructions reviewed with pt. Pt verbalized understanding of all instructions, copy given to pt. Scripts given to pt. Pt calling for ride home.

## 2011-10-06 NOTE — Discharge Summary (Signed)
  Discharge summary dictated on 10/06/2011 dictation number is [id number]

## 2011-10-06 NOTE — Progress Notes (Signed)
Pt's ride here, pt d/c'd via wheelchair with belongings escorted by unit staff.

## 2012-02-04 ENCOUNTER — Emergency Department (HOSPITAL_COMMUNITY)
Admission: EM | Admit: 2012-02-04 | Discharge: 2012-02-04 | Disposition: A | Discharge status: Home / Self Care | Payer: Medicaid Other | Attending: Emergency Medicine | Admitting: Emergency Medicine

## 2012-02-04 ENCOUNTER — Encounter (HOSPITAL_COMMUNITY): Payer: Self-pay | Admitting: *Deleted

## 2012-02-04 DIAGNOSIS — J3489 Other specified disorders of nose and nasal sinuses: Secondary | ICD-10-CM | POA: Insufficient documentation

## 2012-02-04 DIAGNOSIS — E785 Hyperlipidemia, unspecified: Secondary | ICD-10-CM | POA: Insufficient documentation

## 2012-02-04 DIAGNOSIS — R05 Cough: Secondary | ICD-10-CM | POA: Insufficient documentation

## 2012-02-04 DIAGNOSIS — H5440 Blindness, one eye, unspecified eye: Secondary | ICD-10-CM | POA: Insufficient documentation

## 2012-02-04 DIAGNOSIS — Z8673 Personal history of transient ischemic attack (TIA), and cerebral infarction without residual deficits: Secondary | ICD-10-CM | POA: Insufficient documentation

## 2012-02-04 DIAGNOSIS — K219 Gastro-esophageal reflux disease without esophagitis: Secondary | ICD-10-CM | POA: Insufficient documentation

## 2012-02-04 DIAGNOSIS — D649 Anemia, unspecified: Secondary | ICD-10-CM | POA: Insufficient documentation

## 2012-02-04 DIAGNOSIS — R059 Cough, unspecified: Secondary | ICD-10-CM | POA: Insufficient documentation

## 2012-02-04 MED ORDER — PROMETHAZINE-DM 6.25-15 MG/5ML PO SYRP: 5.0000 mL | ORAL_SOLUTION | Freq: Four times a day (QID) | ORAL | Status: DC | PRN

## 2012-02-04 MED ORDER — BUDESONIDE 32 MCG/ACT NA SUSP: 1.0000 | Freq: Every day | NASAL | Status: DC

## 2012-02-04 MED ORDER — FEXOFENADINE-PSEUDOEPHED ER 60-120 MG PO TB12: 1.0000 | ORAL_TABLET | Freq: Two times a day (BID) | ORAL | Status: DC

## 2012-02-04 MED ORDER — PROMETHAZINE-DM 6.25-15 MG/5ML PO SYRP: 5.0000 mL | ORAL_SOLUTION | Freq: Four times a day (QID) | ORAL | Status: AC | PRN

## 2012-02-04 NOTE — ED Notes (Signed)
Pt brought in by EMS from home with reports of cough, nasal and chest congestion that started yesterday. PA evaluated pt prior to being seen by this nurse.

## 2012-02-04 NOTE — ED Notes (Signed)
PA at bedside.

## 2012-02-04 NOTE — ED Provider Notes (Signed)
History     CSN: [id number]  Arrival date & time 02/04/12  1051   First MD Initiated Contact with Patient 02/04/12 1101      Chief Complaint  Patient presents with  . Nasal Congestion  . Cough  . Chest congestion     (Consider location/radiation/quality/duration/timing/severity/associated sxs/prior treatment) HPI Patient presents emergency Dept. with nasal congestion, postnasal drip, and cough at night for the last 24 hours.  Patient states that she has had no fever, chest pain, shortness of breath, weakness, headache, nausea/vomiting, or abdominal pain.  Patient states she has not tried any further treatments at home for her condition.  Past Medical History  Diagnosis Date  . Thyroid disease   . Reflux   . 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 Blindness of left eye 1978  . Hyperlipemia   . Asthma   . Migraine   . Sinus headache   . Cushing's syndrome   . Blood transfusion   . Anemia   . Constipation   . Hemorrhoids   . Depression   . 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 Stomach ulcer     "from the hydrocortisone"    Past Surgical History  Procedure Date  . Adrenal gland surgery 1982    on hydrocortisone  . Atrial myxoma excision 1978  . Vaginal hysterectomy 2000    "partial; w/left ovary"  . Breast biopsy 07/1999    ductal tissue excision & bx right; bx only left breast  . Rotator cuff repair 05/2005    left  . Eye surgery   . Repaired cross eyed     "as a child"    History reviewed. No pertinent family history.  History  Substance Use Topics  . Smoking status: Never Smoker   . Smokeless tobacco: Never Used  . Alcohol Use: No    OB History    Grav Para Term Preterm Abortions TAB SAB Ect Mult Living                  Review of Systems All other systems negative except as documented in the HPI. All pertinent positives and negatives as reviewed in the HPI.  Allergies  Aspirin; Ibuprofen; and  Penicillins  Home Medications   Current Outpatient Rx  Name Route Sig Dispense Refill  . ALPRAZOLAM 0.5 MG PO TABS Oral Take 0.5 mg by mouth at bedtime.    . ASPIRIN 81 MG PO TBEC Oral Take 1 tablet (81 mg total) by mouth daily. 30 tablet 3  . DOCUSATE SODIUM 100 MG PO CAPS Oral Take 100 mg by mouth daily.     Marland Kitchen HYDROCORTISONE 20 MG PO TABS Oral Take 10-20 mg by mouth 2 (two) times daily. Take 1 tablet every morning and 1/2 tablet every evening    . LEVOTHYROXINE SODIUM 75 MCG PO TABS Oral Take 75 mcg by mouth daily.    Marland Kitchen METOPROLOL TARTRATE 12.5 MG HALF TABLET Oral Take 0.5 tablets (12.5 mg total) by mouth 2 (two) times daily. 30 tablet 3  . OMEPRAZOLE 20 MG PO CPDR Oral Take 20 mg by mouth 2 (two) times daily.    . VENLAFAXINE HCL ER 150 MG PO CP24 Oral Take 150 mg by mouth daily.      Marland Kitchen ZOLPIDEM TARTRATE 10 MG PO TABS Oral Take 10 mg by mouth at bedtime.      BP 94/63  Pulse  74  Temp(Src) 98 F (36.7 C) (Oral)  Resp 19  SpO2 94%  Physical Exam  Constitutional: She appears well-developed and well-nourished. No distress.  HENT:  Right Ear: Tympanic membrane normal.  Left Ear: Tympanic membrane normal.  Nose: Mucosal edema and rhinorrhea present. No sinus tenderness, nasal deformity or septal deviation. No epistaxis. Right sinus exhibits no maxillary sinus tenderness and no frontal sinus tenderness. Left sinus exhibits no maxillary sinus tenderness and no frontal sinus tenderness.  Mouth/Throat: Uvula is midline, oropharynx is clear and moist and mucous membranes are normal.  Neck: Normal range of motion. Neck supple.  Cardiovascular: Normal rate, regular rhythm and normal heart sounds.   Pulmonary/Chest: Effort normal and breath sounds normal. No respiratory distress. She has no wheezes.    ED Course  Procedures (including critical care time)  Patient retreated for what appears to be most likely allergic rhinitis based on her history of present illness and physical exam  findings.  She is told to follow up with her primary care doctor for recheck MDM          Carlyle Dolly, PA-C 02/04/12 1204

## 2012-02-04 NOTE — Discharge Instructions (Signed)
Return here as needed.  Follow up with primary care doctor for recheck.

## 2012-02-05 NOTE — ED Provider Notes (Signed)
Medical screening examination/treatment/procedure(s) were performed by non-physician practitioner and as supervising physician I was immediately available for consultation/collaboration.  Sahana Boyland L Cristofer Yaffe, MD 02/05/12 0859 

## 2013-01-07 ENCOUNTER — Inpatient Hospital Stay (HOSPITAL_COMMUNITY)
Admission: EM | Admit: 2013-01-07 | Discharge: 2013-01-10 | DRG: 644 | Disposition: A | Discharge status: Home / Self Care | Payer: Medicaid Other | Attending: Endocrinology | Admitting: Endocrinology

## 2013-01-07 ENCOUNTER — Encounter (HOSPITAL_COMMUNITY): Payer: Self-pay | Admitting: Nurse Practitioner

## 2013-01-07 DIAGNOSIS — E2749 Other adrenocortical insufficiency: Principal | ICD-10-CM | POA: Diagnosis present

## 2013-01-07 DIAGNOSIS — E878 Other disorders of electrolyte and fluid balance, not elsewhere classified: Secondary | ICD-10-CM | POA: Diagnosis present

## 2013-01-07 DIAGNOSIS — R112 Nausea with vomiting, unspecified: Secondary | ICD-10-CM | POA: Diagnosis present

## 2013-01-07 DIAGNOSIS — H5440 Blindness, one eye, unspecified eye: Secondary | ICD-10-CM | POA: Diagnosis present

## 2013-01-07 DIAGNOSIS — R079 Chest pain, unspecified: Secondary | ICD-10-CM | POA: Diagnosis present

## 2013-01-07 DIAGNOSIS — IMO0002 Reserved for concepts with insufficient information to code with codable children: Secondary | ICD-10-CM

## 2013-01-07 DIAGNOSIS — I69998 Other sequelae following unspecified cerebrovascular disease: Secondary | ICD-10-CM

## 2013-01-07 DIAGNOSIS — D649 Anemia, unspecified: Secondary | ICD-10-CM | POA: Diagnosis present

## 2013-01-07 DIAGNOSIS — A088 Other specified intestinal infections: Secondary | ICD-10-CM | POA: Diagnosis present

## 2013-01-07 DIAGNOSIS — F4323 Adjustment disorder with mixed anxiety and depressed mood: Secondary | ICD-10-CM | POA: Diagnosis present

## 2013-01-07 DIAGNOSIS — H539 Unspecified visual disturbance: Secondary | ICD-10-CM

## 2013-01-07 DIAGNOSIS — E039 Hypothyroidism, unspecified: Secondary | ICD-10-CM | POA: Diagnosis present

## 2013-01-07 DIAGNOSIS — E079 Disorder of thyroid, unspecified: Secondary | ICD-10-CM | POA: Diagnosis present

## 2013-01-07 DIAGNOSIS — M129 Arthropathy, unspecified: Secondary | ICD-10-CM | POA: Diagnosis present

## 2013-01-07 DIAGNOSIS — R51 Headache: Secondary | ICD-10-CM | POA: Diagnosis present

## 2013-01-07 DIAGNOSIS — E2740 Unspecified adrenocortical insufficiency: Secondary | ICD-10-CM | POA: Diagnosis present

## 2013-01-07 DIAGNOSIS — E871 Hypo-osmolality and hyponatremia: Secondary | ICD-10-CM | POA: Diagnosis present

## 2013-01-07 DIAGNOSIS — E272 Addisonian crisis: Secondary | ICD-10-CM | POA: Diagnosis present

## 2013-01-07 DIAGNOSIS — K219 Gastro-esophageal reflux disease without esophagitis: Secondary | ICD-10-CM | POA: Diagnosis present

## 2013-01-07 HISTORY — DX: Unspecified adrenocortical insufficiency: E27.40

## 2013-01-07 LAB — CBC WITH DIFFERENTIAL/PLATELET
Basophils Absolute: 0 10*3/uL (ref 0.0–0.1)
Basophils Relative: 0 % (ref 0–1)
Eosinophils Relative: 10 % — ABNORMAL HIGH (ref 0–5)
Lymphocytes Relative: 52 % — ABNORMAL HIGH (ref 12–46)
MCV: 85.1 fL (ref 78.0–100.0)
Neutro Abs: 2.5 10*3/uL (ref 1.7–7.7)
Platelets: 217 10*3/uL (ref 150–400)
RDW: 13.4 % (ref 11.5–15.5)
WBC: 7.9 10*3/uL (ref 4.0–10.5)

## 2013-01-07 LAB — URINALYSIS, ROUTINE W REFLEX MICROSCOPIC
Glucose, UA: NEGATIVE mg/dL
Hgb urine dipstick: NEGATIVE
Protein, ur: NEGATIVE mg/dL
Specific Gravity, Urine: 1.02 (ref 1.005–1.030)
pH: 5 (ref 5.0–8.0)

## 2013-01-07 LAB — COMPREHENSIVE METABOLIC PANEL
ALT: 13 U/L (ref 0–35)
AST: 23 U/L (ref 0–37)
Albumin: 4.1 g/dL (ref 3.5–5.2)
CO2: 23 mEq/L (ref 19–32)
Calcium: 10.8 mg/dL — ABNORMAL HIGH (ref 8.4–10.5)
GFR calc non Af Amer: 60 mL/min — ABNORMAL LOW (ref >90)
Sodium: 130 mEq/L — ABNORMAL LOW (ref 135–145)

## 2013-01-07 LAB — URINE MICROSCOPIC-ADD ON

## 2013-01-07 MED ORDER — SODIUM CHLORIDE 0.9 % IV BOLUS (SEPSIS)
1000.0000 mL | Freq: Once | INTRAVENOUS | Status: AC
  Administered 2013-01-07: 1000 mL via INTRAVENOUS

## 2013-01-07 MED ORDER — HYDROCODONE-ACETAMINOPHEN 5-325 MG PO TABS
1.0000 | ORAL_TABLET | ORAL | Status: DC | PRN
  Administered 2013-01-08: 1 via ORAL
  Administered 2013-01-09 (×4): 2 via ORAL
  Filled 2013-01-07 (×3): qty 2
  Filled 2013-01-07: qty 1
  Filled 2013-01-07: qty 2

## 2013-01-07 MED ORDER — LEVOTHYROXINE SODIUM 75 MCG PO TABS
75.0000 ug | ORAL_TABLET | Freq: Every day | ORAL | Status: DC
  Administered 2013-01-08 – 2013-01-10 (×3): 75 ug via ORAL
  Filled 2013-01-07 (×4): qty 1

## 2013-01-07 MED ORDER — LORATADINE 10 MG PO TABS
10.0000 mg | ORAL_TABLET | Freq: Every day | ORAL | Status: DC
  Administered 2013-01-08 – 2013-01-10 (×3): 10 mg via ORAL
  Filled 2013-01-07 (×3): qty 1

## 2013-01-07 MED ORDER — METHYLPREDNISOLONE SODIUM SUCC 125 MG IJ SOLR
60.0000 mg | Freq: Once | INTRAMUSCULAR | Status: AC
  Administered 2013-01-07: 60 mg via INTRAVENOUS
  Filled 2013-01-07: qty 0.96
  Filled 2013-01-07: qty 2

## 2013-01-07 MED ORDER — PANTOPRAZOLE SODIUM 40 MG PO TBEC
40.0000 mg | DELAYED_RELEASE_TABLET | Freq: Every day | ORAL | Status: DC
  Administered 2013-01-08 – 2013-01-10 (×3): 40 mg via ORAL
  Filled 2013-01-07 (×3): qty 1

## 2013-01-07 MED ORDER — ONDANSETRON HCL 4 MG/2ML IJ SOLN: 4.0000 mg | Freq: Once | INTRAMUSCULAR | Status: DC

## 2013-01-07 MED ORDER — METHYLPREDNISOLONE SODIUM SUCC 125 MG IJ SOLR: 60.0000 mg | Freq: Four times a day (QID) | INTRAMUSCULAR | Status: DC

## 2013-01-07 MED ORDER — ALPRAZOLAM 0.5 MG PO TABS
0.5000 mg | ORAL_TABLET | Freq: Every day | ORAL | Status: DC
  Administered 2013-01-07 – 2013-01-09 (×3): 0.5 mg via ORAL
  Filled 2013-01-07 (×3): qty 1

## 2013-01-07 MED ORDER — PANTOPRAZOLE SODIUM 40 MG IV SOLR
40.0000 mg | Freq: Once | INTRAVENOUS | Status: AC
  Administered 2013-01-07: 40 mg via INTRAVENOUS
  Filled 2013-01-07: qty 40

## 2013-01-07 MED ORDER — ONDANSETRON HCL 4 MG/2ML IJ SOLN
4.0000 mg | Freq: Once | INTRAMUSCULAR | Status: AC
  Administered 2013-01-07: 4 mg via INTRAVENOUS
  Filled 2013-01-07: qty 2

## 2013-01-07 MED ORDER — SODIUM CHLORIDE 0.9 % IV SOLN
INTRAVENOUS | Status: DC
  Administered 2013-01-07 – 2013-01-09 (×3): via INTRAVENOUS

## 2013-01-07 MED ORDER — VENLAFAXINE HCL ER 150 MG PO CP24
150.0000 mg | ORAL_CAPSULE | Freq: Every day | ORAL | Status: DC
  Administered 2013-01-08 – 2013-01-10 (×3): 150 mg via ORAL
  Filled 2013-01-07 (×3): qty 1

## 2013-01-07 MED ORDER — ACETAMINOPHEN 80 MG PO CHEW
80.0000 mg | CHEWABLE_TABLET | Freq: Four times a day (QID) | ORAL | Status: DC | PRN
  Filled 2013-01-07 (×2): qty 1

## 2013-01-07 MED ORDER — DOCUSATE SODIUM 100 MG PO CAPS
100.0000 mg | ORAL_CAPSULE | Freq: Every day | ORAL | Status: DC
  Administered 2013-01-07 – 2013-01-10 (×4): 100 mg via ORAL
  Filled 2013-01-07 (×4): qty 1

## 2013-01-07 MED ORDER — ZOLPIDEM TARTRATE 5 MG PO TABS
10.0000 mg | ORAL_TABLET | Freq: Every day | ORAL | Status: DC
Start: 2013-01-07 — End: 2013-01-10
  Administered 2013-01-07 – 2013-01-09 (×3): 10 mg via ORAL
  Filled 2013-01-07 (×2): qty 2
  Filled 2013-01-07: qty 1
  Filled 2013-01-07: qty 2

## 2013-01-07 MED ORDER — HYDROCORTISONE SOD SUCCINATE 100 MG IJ SOLR
100.0000 mg | Freq: Once | INTRAMUSCULAR | Status: AC
  Administered 2013-01-07: 100 mg via INTRAVENOUS
  Filled 2013-01-07: qty 2

## 2013-01-07 MED ORDER — SODIUM CHLORIDE 0.9 % IV BOLUS (SEPSIS)
500.0000 mL | Freq: Once | INTRAVENOUS | Status: AC
  Administered 2013-01-07: 500 mL via INTRAVENOUS

## 2013-01-07 NOTE — ED Notes (Signed)
In to start IV on patient, not allowing this RN to start and IV. Stating she only gets PICC lines. Paged IV team for IV start. PA made aware. Pt will allow phlebotomy to start IV

## 2013-01-07 NOTE — Progress Notes (Signed)
Pt BP 81/46. Dr. Timothy Lasso paged, a 500cc bolus was ordered.

## 2013-01-07 NOTE — ED Notes (Signed)
Per ems: pt from [facility], went in today for n/v/headache/abd pain for past 4 days. Pt with hx of adrenal insufficiency and has been unable to tolerate daily meds due to vomiting. In office staff found pt BP 60's systolic so called ems for transport. On arrival to scene pt BP 90s systolic and NSR on monitor. Pt a&ox4 en route, main c/o upper abd pain en route. No iv access due to pt difficult stick

## 2013-01-07 NOTE — ED Notes (Signed)
Spoke with IV team, will come down and look for IV.

## 2013-01-07 NOTE — Progress Notes (Signed)
Pt admitted to the unit. Pt is alert and oriented. Pt oriented to room, staff, and call bell. Bed in lowest position. Full assessment to Epic. Call bell with in reach. Told to call for assists. Will continue to monitor.  Chibuike Fleek E  

## 2013-01-07 NOTE — ED Provider Notes (Signed)
3:42 PM Patient signed out to me by Antony Madura, PA-C. Patient is pending labs. Patient will receive IV fluids and be monitored for any signs of adrenal insufficiency crisis here in the ED. If patient improves, she can be discharged. If patient does not improve, patient will be admitted by Dr. [name].   Patient admitted by Dr. Denton Lank to Dr. [name].   Emilia Beck, PA-C 01/08/13 [phone]

## 2013-01-07 NOTE — ED Notes (Signed)
Waiting for IV team RN to come start IV. Pt refuses for ED RN to start IV.

## 2013-01-07 NOTE — H&P (Signed)
[name] is an 57 y.o. female.   PCP:   Julian Hy, MD   Chief Complaint:  Hopotensive, h/a and n/v since yesterday, abdominal pain, no meds tried  HPI:  Vital Signs  Entered weight:  173  lbs., Calculated Weight: 173 lbs., ( 78.47 kg) Height: 59.75 in., ( 151.77 cm) Temperature: 98.0 deg F, Temperature site: oral Pulse rate: 104 Pulse rhythm: regular  Blood Pressure #1: 91 / 72 mm Hg  Blood Pressure #2: 50 / 30 mm Hg  Blood Pressure #3: 68 / 32 mm Hg  BMI: 34.07 BSA: 1.75 Wt Chg: -4 lbs since 11/21/2012  Vitals entered by: Kandice Robinsons CMA on January 07, 2013 11:53 AM  Pulse Oximetry  O2 Saturation: 94 %        Risk Factors:   Tobacco use:  never smoker Passive smoke exposure:  no Drug use:  no HIV high-risk behavior:  no Caffeine use:  <1 drinks per day Alcohol use:  no Exercise:  no Seatbelt use:  100 % Sun Exposure:  frequently  Colonoscopy History:    Date of Last Colonoscopy:  09/09/2012  Mammogram History:    Date of Last Mammogram:  05/15/2011  PAP Smear History:    Date of Last PAP Smear:  05/15/2011  History of Present Illness  History from: patient   History of Present Illness: Patient presents with a complaint of HA, N/V and ABD pain since Monday.  She states that she has not been able to keep her medicines down, nor take PO fluids or solids without emesis.  Pt states that she is dizzy- BP 68 mmHg systolic laying and 70 mmHg sitting.  Upon sitting pt states that she became profoundly dizzy.  In addition the patient has had a decreased appetite, difficulty sleeping and episodes of feeling hot.  Oxygen saturation in the office 92-94% with a HR of 95.  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  B/c of lack of steroids for 2-3 days with her Dehydration she is very ill and Hypotensive now.  Given Solumedrol 125 and Phenergan IM here in Office.  We called to directly admit to Step down.  3 holding so we called 911  and transported her to the ED.      Past Medical History:  Past Medical History  Diagnosis Date  . Thyroid disease   . Reflux   . 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 Blindness of left eye 1978  . Hyperlipemia   . Asthma   . Migraine   . Sinus headache   . Cushing's syndrome   . Blood transfusion   . Anemia   . Constipation   . Hemorrhoids   . Depression   . 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 Stomach ulcer     "from the hydrocortisone"  . 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 Adrenal gland surgery  1982    on hydrocortisone  . Atrial myxoma excision  1978  . Vaginal hysterectomy  2000    "partial; w/left ovary"  . Breast biopsy  07/1999  ductal tissue excision & bx right; bx only left breast  . Rotator cuff repair  05/2005    left  . Eye surgery    . Repaired cross eyed      "as a child"   atrial myoxma removal partial hysterectomy dissection of breast cyst adrenalectomy  eye surgery    Allergies:   Allergies  Allergen Reactions  . Aspirin     Can take baby aspirin  . Ibuprofen (Ibuprofen) Nausea And Vomiting  . Penicillins Nausea And Vomiting     Medications: Prior to Admission medications   Medication Sig Start Date End Date Taking? Authorizing Provider  ALPRAZolam Prudy Feeler) 0.5 MG tablet Take 0.5 mg by mouth at bedtime.    Historical Provider, MD  budesonide (RHINOCORT AQUA) 32 MCG/ACT nasal spray Place 1 spray into the nose daily. 02/04/12 02/03/13  Jamesetta Orleans Lawyer, PA-C  docusate sodium (COLACE) 100 MG capsule  Take 100 mg by mouth daily.     Historical Provider, MD  fexofenadine-pseudoephedrine (ALLEGRA-D) 60-120 MG per tablet Take 1 tablet by mouth every 12 (twelve) hours. 02/04/12 02/03/13  Jamesetta Orleans Lawyer, PA-C  hydrocortisone (CORTEF) 20 MG tablet Take 10-20 mg by mouth 2 (two) times daily. Take 1 tablet every morning and 1/2 tablet every evening    Historical Provider, MD  levothyroxine (SYNTHROID, LEVOTHROID) 75 MCG tablet Take 75 mcg by mouth daily.    Historical Provider, MD  omeprazole (PRILOSEC) 20 MG capsule Take 20 mg by mouth 2 (two) times daily.    Historical Provider, MD  venlafaxine (EFFEXOR-XR) 150 MG 24 hr capsule Take 150 mg by mouth daily.      Historical Provider, MD  zolpidem (AMBIEN) 10 MG tablet Take 10 mg by mouth at bedtime.    Historical Provider, MD      (Not in a hospital admission)   Social History:  reports that she has never smoked. She has never used smokeless tobacco. She reports that she does not drink alcohol or use illicit drugs.  Family History: History reviewed. No pertinent family history.  Review of Systems:   General:       Complains of headache, anorexia, fatigue, malaise, sleep disturbance.        Denies fevers, chills, sweats.   Eyes:       Denies blurring, vision loss.   Ears/Nose/Throat:       Complains of nasal congestion.        Denies earache, ear discharge, sore throat, hoarseness.   Cardiovascular:       Denies chest pains, palpitations, syncope.   Respiratory:       Denies dyspnea, hemoptysis, wheezing.   Gastrointestinal:       Complains of nausea, vomiting, change in bowel habits, abdominal pain.        Denies diarrhea, melena, hematochezia, jaundice.   Genitourinary:       Denies vaginal discharge, dysuria, hematuria, abnormal vaginal bleeding.   Musculoskeletal:       Denies back pain, joint pain, muscle cramps, muscle weakness.   Neurologic:       Complains of dizziness.        Denies paresthesias, syncope, tremors,  vertigo.   Endocrine:       Denies cold intolerance, heat intolerance, polydipsia, polyphagia, polyuria.      Physical Exam:  Blood pressure 82/50, pulse 95, temperature 98.2 F (36.8 C), temperature source Oral, resp. rate 18, SpO2 99.00%. Filed Vitals:   01/07/13 1542 01/07/13 1544 01/07/13 1545 01/07/13 1711  BP: 101/68 94/68 92/56  82/50  Pulse: 96 113 123 95  Temp: 98.2 F (36.8 C)     TempSrc: Oral     Resp: 22 22 16 18   SpO2: 97% 98% 94% 99%   General appearance: looks older than stated age, weak, lethargic  Eyes  External: conjunctivae and lids normal Pupils:  round, reactive to light on R, 6mm and non-ractive on L- blind per pt  Ears, Nose and Throat  External ears: normal, no lesions or deformities External nose: normal, no lesions or deformities Hearing: grossly intact  Neck  Neck: supple, no masses, trachea midline Thyroid: no nodules, masses, tenderness, or enlargement  Respiratory  Respiratory effort: no intercostal retractions or use of accessory muscles Auscultation: no rales, rhonchi, or wheezes  Cardiovascular  Auscultation: S1, S2, no murmur, rub, or gallop Pedal pulses: 1+ bilaterally Periph. circulation: no cyanosis, clubbing, edema  Gastrointestinal  Abdomen: soft, tender in all quads with hypoactive bowel sounds throughout.  Lymphatic  Neck: no cervical adenopathy  Musculoskeletal  Gait and station: patient has a slow ataxic gait Digits and nails: no clubbing, cyanosis, petechiae, or nodes Head and neck: normal alignment and mobility Spine, ribs, pelvis: normal alignment and mobility, no deformity  Skin  Inspection: scars to abdomen-poor skin turgor Palpation: cool and clammy Speech: Normal  Mental Status Exam  Judgment, insight: intact Orientation: oriented to time, place, and person Memory: intact Mood and affect: flat affect      Labs on Admission:   Recent Labs  01/07/13 1419  NA 130*  K 5.1  CL 95*  CO2 23   GLUCOSE 81  BUN 24*  CREATININE 1.04  CALCIUM 10.8*    Recent Labs  01/07/13 1419  AST 23  ALT 13  ALKPHOS 63  BILITOT 0.6  PROT 8.3  ALBUMIN 4.1    Recent Labs  01/07/13 1419  LIPASE 26    Recent Labs  01/07/13 1419  WBC 7.9  NEUTROABS 2.5  HGB 16.0*  HCT 44.7  MCV 85.1  PLT 217   No results found for this basename: CKTOTAL, CKMB, CKMBINDEX, TROPONINI,  in the last 72 hours Lab Results  Component Value Date   INR 1.05 10/03/2011   INR 0.96 10/03/2011     LAB RESULT POCT:  Results for orders placed during the hospital encounter of 01/07/13  CBC WITH DIFFERENTIAL      Result Value Range   WBC 7.9  4.0 - 10.5 K/uL   RBC 5.25 (*) 3.87 - 5.11 MIL/uL   Hemoglobin 16.0 (*) 12.0 - 15.0 g/dL   HCT 16.1  09.6 - 04.5 %   MCV 85.1  78.0 - 100.0 fL   MCH 30.5  26.0 - 34.0 pg   MCHC 35.8  30.0 - 36.0 g/dL   RDW 40.9  81.1 - 91.4 %   Platelets 217  150 - 400 K/uL   Neutrophils Relative 31 (*) 43 - 77 %   Neutro Abs 2.5  1.7 - 7.7 K/uL   Lymphocytes Relative 52 (*) 12 - 46 %   Lymphs Abs 4.1 (*) 0.7 - 4.0 K/uL   Monocytes Relative 7  3 - 12 %   Monocytes Absolute 0.6  0.1 - 1.0 K/uL   Eosinophils Relative 10 (*) 0 - 5 %   Eosinophils Absolute 0.8 (*) 0.0 - 0.7 K/uL   Basophils Relative 0  0 - 1 %   Basophils Absolute 0.0  0.0 - 0.1 K/uL  COMPREHENSIVE METABOLIC PANEL  Result Value Range   Sodium 130 (*) 135 - 145 mEq/L   Potassium 5.1  3.5 - 5.1 mEq/L   Chloride 95 (*) 96 - 112 mEq/L   CO2 23  19 - 32 mEq/L   Glucose, Bld 81  70 - 99 mg/dL   BUN 24 (*) 6 - 23 mg/dL   Creatinine, Ser 1.61  0.50 - 1.10 mg/dL   Calcium 09.6 (*) 8.4 - 10.5 mg/dL   Total Protein 8.3  6.0 - 8.3 g/dL   Albumin 4.1  3.5 - 5.2 g/dL   AST 23  0 - 37 U/L   ALT 13  0 - 35 U/L   Alkaline Phosphatase 63  39 - 117 U/L   Total Bilirubin 0.6  0.3 - 1.2 mg/dL   GFR calc non Af Amer 60 (*) >90 mL/min   GFR calc Af Amer 69 (*) >90 mL/min  LIPASE, BLOOD      Result Value Range    Lipase 26  11 - 59 U/L  URINALYSIS, ROUTINE W REFLEX MICROSCOPIC      Result Value Range   Color, Urine YELLOW  YELLOW   APPearance CLEAR  CLEAR   Specific Gravity, Urine 1.020  1.005 - 1.030   pH 5.0  5.0 - 8.0   Glucose, UA NEGATIVE  NEGATIVE mg/dL   Hgb urine dipstick NEGATIVE  NEGATIVE   Bilirubin Urine NEGATIVE  NEGATIVE   Ketones, ur NEGATIVE  NEGATIVE mg/dL   Protein, ur NEGATIVE  NEGATIVE mg/dL   Urobilinogen, UA 1.0  0.0 - 1.0 mg/dL   Nitrite NEGATIVE  NEGATIVE   Leukocytes, UA MODERATE (*) NEGATIVE  URINE MICROSCOPIC-ADD ON      Result Value Range   Squamous Epithelial / LPF MANY (*) RARE   WBC, UA 3-6  <3 WBC/hpf      Radiological Exams on Admission: No results found.    Orders placed during the hospital encounter of 01/07/13  . ED EKG  . ED EKG  . EKG 12-LEAD  . EKG 12-LEAD     Assessment/Plan Principal Problem:   Adrenal crisis Active Problems:   Nausea & vomiting   Adrenal insufficiency   Hyponatremia   Problem # 1:  OTHER ADRENAL HYPOFUNCTION (ICD-255.5) (ICD10-E27.8) GI Illness causing N/V/D/HA/Ab Pain.   Dehydrated and Adrenal Crisis noted   SBPs in 60-70s S/P IV and IM steroids and fluid.  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  B/c of lack of steroids for 2-3 days with her Dehydration she is very ill and Hypotensive now.  Given Solumedrol 125 and Phenergan IM here at [facility] prior to going to ED.  We called to directly admit to Step down.  3 holding so we called 911 and transported her to the ED. Will need admit, KUB, Labs, IVF and Eval and Treat.  Hyponatremia and DeH noted.  Problem # 2:  HYPOTHYROIDISM, PRIMARY (ICD-244.9) (ICD10-E03.9) Only 1/2 on Sunday  Her updated medication list for this problem includes:    Synthroid 75 Mcg Tabs (Levothyroxine sodium) .Marland Kitchen... 1 tab po qd   Problem # 3:  Abdominal pain (ICD-789.00) (ICD10-R10.9) W/Up in ED labs and a KUB  Problem # 4:  Nausea and vomiting (ICD-787.01)  (ICD10-R11.2) Anti-emetics. Orders: Injection Admin ([id number]) Solu-Medrol up to 125mg  (CPT-J2930) Phenergan 25mg  (CPT-J2550)   Problem # 5:  Hypotension (ICD-458.9) (ICD10-I95.9) S/P IM Solumedrol and will need IV Rx until tolerating POs.  Medications Added to Medication List This Visit: 1)  Solu-medrol 125  Mg Solr (Methylprednisolone sodium succ) .Marland Kitchen.. 125 mg intramuscular right gluteal 2)  Phenergan 25 Mg/ml Soln (Promethazine hcl) .Marland Kitchen.. 1 ml intramuscular left gluteal  Complete Medication List: 1)  Solu-medrol 125 Mg Solr (Methylprednisolone sodium succ) .Marland Kitchen.. 125 mg intramuscular right gluteal 2)  Phenergan 25 Mg/ml Soln (Promethazine hcl) .Marland Kitchen.. 1 ml intramuscular left gluteal 3)  Metoprolol Tartrate 25 Mg Tabs (Metoprolol tartrate) .... 1/2 tab po bid 4)  Effexor Xr 150 Mg Xr24h-cap (Venlafaxine hcl) .Marland Kitchen.. 1 cap po qd 5)  Hydrocortisone 20 Mg Tabs (Hydrocortisone) .Marland Kitchen.. 1 tab po q am and 1/2 tab po q pm 6)  Synthroid 75 Mcg Tabs (Levothyroxine sodium) .Marland Kitchen.. 1 tab po qd 7)  Neurontin 300 Mg Caps (Gabapentin) .... Qhs 8)  Prilosec 20 Mg Cpdr (Omeprazole) .Marland Kitchen.. 1 tab po bid 9)  Dolacet 5-500 Mg Caps (Hydrocodone-acetaminophen) .... Prn 10)  Zolpidem Tartrate 10 Mg Tabs (Zolpidem tartrate) .Marland Kitchen.. 1 tab po qhs prn   Comments: SEND TO ED and will need admit to Dr Lyndle Herrlich Service.    Phenergan (promethazine hcl) Injection Strength: 25 MG/ML Total Dose: 25MG  Volume: Stop Date: 01/14/2013 Route: Intramuscular Injection Site: left gluteal Manufacturer: [employer] Lot #: [id number] Exp. Date: 01/08/2014 Comments: Pt tolerated procedure well Given by: Sara Chu LPN   SoluMedrol (methylprednisolone sodium succinate) Injection Strength: 125 MG Total Dose: 125 MG Stop Date: 01/14/2013 Route: Intramuscular Injection Site: right gluteal Manufacturer: [employer] Lot #: [id number] Exp. Date: 01/08/2014 Comments: Pt tolerated procedure well. Given by: Sara Chu  LPN    Taleisha Kaczynski M 01/07/2013, 5:18 PM

## 2013-01-07 NOTE — ED Notes (Signed)
IV team at bedside, looking for IV. Meds ready to be administered.

## 2013-01-07 NOTE — ED Notes (Signed)
Regular diet ordered for pt

## 2013-01-07 NOTE — ED Provider Notes (Signed)
History     CSN: [id number]  Arrival date & time 01/07/13  1327   First MD Initiated Contact with Patient 01/07/13 1328      Chief Complaint  Patient presents with  . Hypotension    (Consider location/radiation/quality/duration/timing/severity/associated sxs/prior treatment) HPI Comments: Patient and is a 57 year old female with a history of adrenal insufficiency who presents to the ED from her PCPs office for hypotension. Patient was being evaluated for nausea, NB/NB emesis, and abdominal pain x1 week without any aggravating or alleviating factors. Patient states that she has been unable to keep down fluids and food by mouth without emesis. Last BM yesterday which was greenish brown in color and normal in consistency. Patient denies fevers, chest pain, shortness of breath, urinary symptoms, diarrhea, melena/hematochezia, numbness or tingling in her extremities. Patient's abdominal surgical hx includes an abdominal hysterectomy.  Per Doctor Russo's note today - BP 68 mmHg systolic laying and 70 mmHg sitting in office. Upon sitting pt states that she became profoundly dizzy. Patient was given Solumedrol 125 and Phenergan IM in office. [facility] called for direct admit to Step down; 3 holding so patient transported to the ED.  The history is provided by the patient. No language interpreter was used.    Past Medical History  Diagnosis Date  . Thyroid disease   . Reflux   . 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 Blindness of left eye 1978  . Hyperlipemia   . Asthma   . Migraine   . Sinus headache   . Cushing's syndrome   . Blood transfusion   . Anemia   . Constipation   . Hemorrhoids   . Depression   . 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 Stomach ulcer     "from the hydrocortisone"  . Adrenal hypofunction     Past Surgical History  Procedure Laterality Date  . Adrenal gland surgery  1982    on hydrocortisone  . Atrial myxoma excision   1978  . Vaginal hysterectomy  2000    "partial; w/left ovary"  . Breast biopsy  07/1999    ductal tissue excision & bx right; bx only left breast  . Rotator cuff repair  05/2005    left  . Eye surgery    . Repaired cross eyed      "as a child"    History reviewed. No pertinent family history.  History  Substance Use Topics  . Smoking status: Never Smoker   . Smokeless tobacco: Never Used  . Alcohol Use: No    OB History   Grav Para Term Preterm Abortions TAB SAB Ect Mult Living                  Review of Systems  Constitutional: Positive for appetite change and fatigue. Negative for fever.  Respiratory: Negative for shortness of breath.   Cardiovascular: Negative for chest pain.  Gastrointestinal: Positive for nausea, vomiting and abdominal pain. Negative for diarrhea.  Neurological: Positive for dizziness and light-headedness. Negative for syncope.  All other systems reviewed and are negative.    Allergies  Aspirin; Ibuprofen; and Penicillins  Home Medications   Current Outpatient Rx  Name  Route  Sig  Dispense  Refill  . ALPRAZolam (XANAX) 0.5 MG tablet   Oral   Take 0.5 mg by mouth at bedtime.         . budesonide (RHINOCORT AQUA)  32 MCG/ACT nasal spray   Nasal   Place 1 spray into the nose daily.   1 Bottle   0   . docusate sodium (COLACE) 100 MG capsule   Oral   Take 100 mg by mouth daily.          . fexofenadine-pseudoephedrine (ALLEGRA-D) 60-120 MG per tablet   Oral   Take 1 tablet by mouth every 12 (twelve) hours.   30 tablet   0   . hydrocortisone (CORTEF) 20 MG tablet   Oral   Take 10-20 mg by mouth 2 (two) times daily. Take 1 tablet every morning and 1/2 tablet every evening         . levothyroxine (SYNTHROID, LEVOTHROID) 75 MCG tablet   Oral   Take 75 mcg by mouth daily.         . metoprolol tartrate (LOPRESSOR) 25 MG tablet   Oral   Take 12.5 mg by mouth 2 (two) times daily.         Marland Kitchen omeprazole (PRILOSEC) 20 MG  capsule   Oral   Take 20 mg by mouth 2 (two) times daily.         Marland Kitchen venlafaxine (EFFEXOR-XR) 150 MG 24 hr capsule   Oral   Take 150 mg by mouth daily.           Marland Kitchen zolpidem (AMBIEN) 10 MG tablet   Oral   Take 10 mg by mouth at bedtime.           BP 119/89  Pulse 101  Temp(Src) 97.9 F (36.6 C) (Oral)  Resp 17  SpO2 96%  Physical Exam  Nursing note and vitals reviewed. Constitutional: She is oriented to person, place, and time. She appears well-developed and well-nourished. No distress.  HENT:  Head: Normocephalic and atraumatic.  Mouth/Throat: Oropharynx is clear and moist.  Eyes: Conjunctivae are normal. Pupils are equal, round, and reactive to light. No scleral icterus.  Neck: Normal range of motion. Neck supple.  Cardiovascular: Normal rate, regular rhythm, normal heart sounds and intact distal pulses.   Pulmonary/Chest: Effort normal and breath sounds normal. No respiratory distress. She has no wheezes. She has no rales.  Abdominal: Soft. She exhibits no mass. There is tenderness. There is no rebound and no guarding.  Generalized TTP without palpable masses or peritoneal signs  Musculoskeletal: Normal range of motion.  Lymphadenopathy:    She has no cervical adenopathy.  Neurological: She is alert and oriented to person, place, and time.  Skin: Skin is warm and dry. No rash noted. She is not diaphoretic. No erythema.  Psychiatric: She has a normal mood and affect. Her behavior is normal.    ED Course  Procedures (including critical care time)  Labs Reviewed  CBC WITH DIFFERENTIAL - Abnormal; Notable for the following:    RBC 5.25 (*)    Hemoglobin 16.0 (*)    Neutrophils Relative 31 (*)    Lymphocytes Relative 52 (*)    Lymphs Abs 4.1 (*)    Eosinophils Relative 10 (*)    Eosinophils Absolute 0.8 (*)    All other components within normal limits  COMPREHENSIVE METABOLIC PANEL - Abnormal; Notable for the following:    Sodium 130 (*)    Chloride 95 (*)     BUN 24 (*)    Calcium 10.8 (*)    GFR calc non Af Amer 60 (*)    GFR calc Af Amer 69 (*)    All other components within normal  limits  LIPASE, BLOOD  URINALYSIS, ROUTINE W REFLEX MICROSCOPIC   No results found.   Date: 01/07/2013  Rate: 104  Rhythm: sinus tachycardia and premature ventricular contractions (PVC)  QRS Axis: normal  Intervals: normal  ST/T Wave abnormalities: nonspecific ST/T changes  Conduction Disutrbances:none  Narrative Interpretation: Sinus tachycardia with 3 PVCs  Old EKG Reviewed: unchanged from 10/04/2011 I have personally reviewed and interpreted this EKG.   No diagnosis found.    MDM  Patient is a 57 year old female with a history of adrenal insufficiency who presents to the emergency department for hypertension. Patient was being evaluated by her PCP for one week of nausea, NB/NB emesis, and abdominal pain when she was found to be hypotensive with a systolic BP in the 60s. Patient given Solumedrol and phenergan and transported to ED from PCP office. On physical exam, there is generalized tenderness to palpation of the abdomen without palpable masses or peritoneal signs. Patient is well and nontoxic appearing; hemodynamically stable with a systolic blood pressure of 107.  Dr. Denton Lank has consulted with Dr. [name]; Dr. [name] is comfortable not admitting the patient if able to tolerate PO fluids, BP remains stable after IVF hydration and IV Solu-Cortef, and if electrolytes are not grossly abnormal. Dr. [name] requested a recheck of BP and electrolytes after a few hours of IVF hydration and steroids. If this is not the case, call Dr. [name] back to admit.  Patient signed out to [facility], PA-C at shift change for further evaluation and dispo.      Antony Madura, PA-C 01/07/13 1521

## 2013-01-08 LAB — BASIC METABOLIC PANEL
BUN: 19 mg/dL (ref 6–23)
CO2: 21 mEq/L (ref 19–32)
Chloride: 105 mEq/L (ref 96–112)
Creatinine, Ser: 0.96 mg/dL (ref 0.50–1.10)
GFR calc Af Amer: 76 mL/min — ABNORMAL LOW (ref >90)
Potassium: 5 mEq/L (ref 3.5–5.1)

## 2013-01-08 LAB — CBC
HCT: 36.2 % (ref 36.0–46.0)
Hemoglobin: 12.7 g/dL (ref 12.0–15.0)
MCHC: 35.1 g/dL (ref 30.0–36.0)
MCV: 85.6 fL (ref 78.0–100.0)
RDW: 13.1 % (ref 11.5–15.5)
WBC: 9.2 10*3/uL (ref 4.0–10.5)

## 2013-01-08 MED ORDER — ENOXAPARIN SODIUM 40 MG/0.4ML ~~LOC~~ SOLN
40.0000 mg | SUBCUTANEOUS | Status: DC
  Administered 2013-01-09: 40 mg via SUBCUTANEOUS
  Filled 2013-01-08 (×3): qty 0.4

## 2013-01-08 NOTE — Progress Notes (Signed)
Subjective: Feels somewhat better. Still some stomach discomfort. Ate breakfast with no vomiting. Some sinus pain. Still weak   Objective: Vital signs in last 24 hours: Temp:  [97.3 F (36.3 C)-98.6 F (37 C)] 97.3 F (36.3 C) (04/30 0553) Pulse Rate:  [56-123] 89 (04/30 0553) Resp:  [16-22] 17 (04/30 0553) BP: (71-128)/(34-89) 113/64 mmHg (04/30 0623) SpO2:  [91 %-100 %] 97 % (04/30 0553) Weight:  [80.241 kg (176 lb 14.4 oz)] 80.241 kg (176 lb 14.4 oz) (04/29 1931)  Intake/Output from previous day: 04/29 0701 - 04/30 0700 In: -  Out: 300 [Urine:300] Intake/Output this shift:    General: fatigued  But in no distress. Esotropia, anicteric, lungs clear ht regular with murmur. abd min distention good BS's. Awake,alert, mentation intact  Lab Results   Recent Labs  01/07/13 1419 01/08/13 0545  WBC 7.9 9.2  RBC 5.25* 4.23  HGB 16.0* 12.7  HCT 44.7 36.2  MCV 85.1 85.6  MCH 30.5 30.0  RDW 13.4 13.1  PLT 217 261    Recent Labs  01/07/13 1419 01/08/13 0545  NA 130* 134*  K 5.1 5.0  CL 95* 105  CO2 23 21  GLUCOSE 81 159*  BUN 24* 19  CREATININE 1.04 0.96  CALCIUM 10.8* 9.2    Studies/Results: No results found.  Scheduled Meds: . ALPRAZolam  0.5 mg Oral QHS  . docusate sodium  100 mg Oral Daily  . levothyroxine  75 mcg Oral QAC breakfast  . loratadine  10 mg Oral Daily  . ondansetron (ZOFRAN) IV  4 mg Intravenous Once  . pantoprazole  40 mg Oral Daily  . venlafaxine XR  150 mg Oral Daily  . zolpidem  10 mg Oral QHS   Continuous Infusions: . sodium chloride 150 mL/hr at 01/07/13 1907   PRN Meds:acetaminophen, HYDROcodone-acetaminophen  Assessment/Plan: ADRENAL CRISIS: Doing better. Electrolytes are better. BP was soft overnight, stay on IV steroids and fluids N/V: Exam not impressive, suspect viral cause HYPOTHYROID; on Rx DEPRESSION: doing OK   LOS: 1 day   [name] [name] 01/08/2013, 10:40 AM

## 2013-01-08 NOTE — ED Provider Notes (Signed)
Medical screening examination/treatment/procedure(s) were performed by non-physician practitioner and as supervising physician I was immediately available for consultation/collaboration.   Dione Booze, MD 01/08/13 [phone]

## 2013-01-08 NOTE — ED Provider Notes (Signed)
Medical screening examination/treatment/procedure(s) were conducted as a shared visit with non-physician practitioner(s) and myself.  I personally evaluated the patient during the encounter Pt with hx adrenal insuff, w nv x 2 days w inability to tolerating her meds incl her steroid rx.  gen weak, faint when stands. Pt hypotensive at pcp office and orthostatic in ed.  Na mildly low, k inc from prior but in normal range - c/w relative adrenal insuff/?impending crisis.  Ivf, solucortef, admit.   Suzi Roots, MD 01/08/13 [id number]

## 2013-01-09 ENCOUNTER — Inpatient Hospital Stay (HOSPITAL_COMMUNITY): Payer: Medicaid Other

## 2013-01-09 LAB — CBC
Hemoglobin: 10.2 g/dL — ABNORMAL LOW (ref 12.0–15.0)
MCH: 29.5 pg (ref 26.0–34.0)
MCHC: 33.8 g/dL (ref 30.0–36.0)
Platelets: 258 10*3/uL (ref 150–400)
RDW: 13.9 % (ref 11.5–15.5)

## 2013-01-09 LAB — COMPREHENSIVE METABOLIC PANEL
ALT: 9 U/L (ref 0–35)
Alkaline Phosphatase: 44 U/L (ref 39–117)
BUN: 11 mg/dL (ref 6–23)
CO2: 22 mEq/L (ref 19–32)
GFR calc Af Amer: >90 mL/min (ref >90)
GFR calc non Af Amer: >90 mL/min (ref >90)
Glucose, Bld: 140 mg/dL — ABNORMAL HIGH (ref 70–99)
Potassium: 4 mEq/L (ref 3.5–5.1)
Sodium: 139 mEq/L (ref 135–145)
Total Protein: 6.1 g/dL (ref 6.0–8.3)

## 2013-01-09 LAB — TSH: TSH: 0.67 u[IU]/mL (ref 0.350–4.500)

## 2013-01-09 MED ORDER — HYDROCORTISONE 20 MG PO TABS
40.0000 mg | ORAL_TABLET | Freq: Every day | ORAL | Status: DC
  Administered 2013-01-09 – 2013-01-10 (×2): 40 mg via ORAL
  Filled 2013-01-09 (×2): qty 2

## 2013-01-09 MED ORDER — SODIUM CHLORIDE 0.9 % IV SOLN
INTRAVENOUS | Status: DC
  Administered 2013-01-09 – 2013-01-10 (×2): via INTRAVENOUS

## 2013-01-09 MED ORDER — HYDROCORTISONE 20 MG PO TABS
20.0000 mg | ORAL_TABLET | Freq: Every day | ORAL | Status: DC
  Administered 2013-01-09: 20 mg via ORAL
  Filled 2013-01-09 (×2): qty 1

## 2013-01-09 MED ORDER — METHYLPREDNISOLONE SODIUM SUCC 125 MG IJ SOLR
60.0000 mg | INTRAMUSCULAR | Status: AC
  Administered 2013-01-09: 60 mg via INTRAVENOUS
  Filled 2013-01-09: qty 0.96

## 2013-01-09 NOTE — Progress Notes (Signed)
Patient ID: [name], female   DOB: 05/24/1956, 57 y.o.   MRN: [id number]  Still complaining of right sided chest pain and back pain. EKG done stat looks very similar to 04/29 with some twave changes but no ST elevations. Each IVCD noted.  HR 104, BP 117/65   O2 sat fine at 95%  Lying nearly flat without dyspnea. Does not appear to be in distress  Will await CXR Will give a dose of IV solumedrol since it seems to be off of her list  Assunta Curtis MD [facility]

## 2013-01-09 NOTE — Progress Notes (Signed)
Subjective: Some right breast, and chest wall pain. Sore to the touch. Some SOB  Objective: Vital signs in last 24 hours: Temp:  [97.6 F (36.4 C)-97.7 F (36.5 C)] 97.7 F (36.5 C) (05/01 0457) Pulse Rate:  [98-101] 101 (05/01 0457) Resp:  [16-18] 16 (05/01 0457) BP: (98-117)/(59-65) 117/65 mmHg (05/01 0457) SpO2:  [98 %-100 %] 100 % (05/01 0457)  Intake/Output from previous day: 04/30 0701 - 05/01 0700 In: 1890 [P.O.:240; I.V.:1650] Out: 700 [Urine:700] Intake/Output this shift:    General: mild distress lying flat. Face symmetric, neck supple. No wheeze or rub. Right ant chest is a bit sore to the touch. Ht regular, sl fast with murmur. abd soft NT, non distended. A bit anxious  Lab Results   Recent Labs  01/08/13 0545 01/09/13 0638  WBC 9.2 11.6*  RBC 4.23 3.46*  HGB 12.7 10.2*  HCT 36.2 30.2*  MCV 85.6 87.3  MCH 30.0 29.5  RDW 13.1 13.9  PLT 261 258    Recent Labs  01/08/13 0545 01/09/13 0638  NA 134* 139  K 5.0 4.0  CL 105 108  CO2 21 22  GLUCOSE 159* 140*  BUN 19 11  CREATININE 0.96 0.78  CALCIUM 9.2 8.8    Studies/Results: No results found.  Scheduled Meds: . ALPRAZolam  0.5 mg Oral QHS  . docusate sodium  100 mg Oral Daily  . enoxaparin (LOVENOX) injection  40 mg Subcutaneous Q24H  . levothyroxine  75 mcg Oral QAC breakfast  . loratadine  10 mg Oral Daily  . ondansetron (ZOFRAN) IV  4 mg Intravenous Once  . pantoprazole  40 mg Oral Daily  . venlafaxine XR  150 mg Oral Daily  . zolpidem  10 mg Oral QHS   Continuous Infusions: . sodium chloride 150 mL/hr at 01/09/13 0530   PRN Meds:acetaminophen, HYDROcodone-acetaminophen  Assessment/Plan: CHEST PAIN/CHEST WALL PAIN: doubt cardiac but will check CXR and EKG. On lovenox so PE less likely ADRENAL CRISIS: change to PO steroids at double normal N/V: resolved HYPOTHYROID; on Rx  Results for LEANORE, BIGGERS (MRN [id number]) as of 01/09/2013 10:47  Ref. Range 01/08/2013 05:45  TSH Latest  Range: 0.350-4.500 uIU/mL 0.631   DEPRESSION: doing OK     LOS: 2 days   Aston Lawhorn [name] 01/09/2013, 10:44 AM

## 2013-01-10 DIAGNOSIS — F4323 Adjustment disorder with mixed anxiety and depressed mood: Secondary | ICD-10-CM | POA: Diagnosis present

## 2013-01-10 DIAGNOSIS — R079 Chest pain, unspecified: Secondary | ICD-10-CM | POA: Diagnosis present

## 2013-01-10 DIAGNOSIS — D649 Anemia, unspecified: Secondary | ICD-10-CM | POA: Diagnosis present

## 2013-01-10 MED ORDER — OXYCODONE-ACETAMINOPHEN 5-325 MG PO TABS: 1.0000 | ORAL_TABLET | ORAL | Status: DC | PRN

## 2013-01-10 NOTE — Care Management Note (Signed)
    Page 1 of 1   01/10/2013     4:41:51 PM   CARE MANAGEMENT NOTE 01/10/2013  Patient:  [name], [name]   Account Number:  [id number]  Date Initiated:  01/10/2013  Documentation initiated by:  Letha Cape  Subjective/Objective Assessment:   dx adrenal crisis  admit- lives with spouse. pta indep.     Action/Plan:   Anticipated DC Date:  01/10/2013   Anticipated DC Plan:  HOME/SELF CARE      DC Planning Services  CM consult      Choice offered to / List presented to:             Status of service:  Completed, signed off Medicare Important Message given?   (If response is "NO", the following Medicare IM given date fields will be blank) Date Medicare IM given:   Date Additional Medicare IM given:    Discharge Disposition:  HOME/SELF CARE  Per UR Regulation:  Reviewed for med. necessity/level of care/duration of stay  If discussed at Long Length of Stay Meetings, dates discussed:    Comments:  01/10/13 16:40 Letha Cape RN, BSN [phone] patient lives with spouse, pta indep.  Patient for dc today, no needs anticipated.

## 2013-01-10 NOTE — Discharge Summary (Signed)
DISCHARGE SUMMARY  [name]  MR#: [id number]  DOB:[birth year]  Date of Admission: 01/07/2013 Date of Discharge: 01/10/2013  Attending Physician:Halsey Hammen [name]  Patient's [name]:[name],YNWGNFA Mikayla Diener, MD  Consults:  none  Discharge Diagnoses: Principal Problem:   Adrenal crisis, now resolved Active Problems:   Nausea & vomiting. Probable viral gastroenteritis   Adrenal insufficiency, with history of bilateral adrenalectomy. Known Carneys syndrome   Hyponatremia, improved   Anemia, mild likely  dilutional   Chest pain, unspecified, with negative workup now resolved   Adjustment disorder with mixed anxiety and depressed mood  GERD Hypothyroidism, stable Chronic headaches   Discharge Medications:   Medication List    TAKE these medications       ALPRAZolam 0.5 MG tablet  Commonly known as:  XANAX  Take 0.5 mg by mouth at bedtime.     budesonide 32 MCG/ACT nasal spray  Commonly known as:  RHINOCORT AQUA  Place 1 spray into the nose daily.     docusate sodium 100 MG capsule  Commonly known as:  COLACE  Take 100 mg by mouth daily.     fexofenadine-pseudoephedrine 60-120 MG per tablet  Commonly known as:  ALLEGRA-D  Take 1 tablet by mouth every 12 (twelve) hours.     hydrocortisone 20 MG tablet  Commonly known as:  CORTEF  Take 10-20 mg by mouth 2 (two) times daily. Take 1 tablet every morning and 1/2 tablet every evening     levothyroxine 75 MCG tablet  Commonly known as:  SYNTHROID, LEVOTHROID  Take 75 mcg by mouth daily.     metoprolol tartrate 25 MG tablet  Commonly known as:  LOPRESSOR  Take 12.5 mg by mouth 2 (two) times daily.     omeprazole 20 MG capsule  Commonly known as:  PRILOSEC  Take 20 mg by mouth 2 (two) times daily.     oxyCODONE-acetaminophen 5-325 MG per tablet  Commonly known as:  PERCOCET/ROXICET  Take 1-2 tablets by mouth every 4 (four) hours as needed.     venlafaxine XR 150 MG 24 hr capsule  Commonly known as:  EFFEXOR-XR   Take 150 mg by mouth daily.     zolpidem 10 MG tablet  Commonly known as:  AMBIEN  Take 10 mg by mouth at bedtime.        Hospital Procedures: Dg Chest 2 View  01/09/2013  *RADIOLOGY REPORT*  Clinical Data: Right-sided chest pain.  Shortness of breath.  CHEST - 2 VIEW  Comparison: Chest x-ray 10/03/2011.  Findings: Lung volumes are low.  There are bibasilar opacities which are favored to represent areas of subsegmental atelectasis, although developing areas of airspace consolidation from mild aspiration or infection are difficult to entirely exclude.  Mild cephalization of the pulmonary vasculature, likely accentuated by low lung volumes, without frank pulmonary edema.  Heart size is upper limits of normal.  Mediastinal contours are otherwise unremarkable.  Median sternotomy wires are noted.  Abandoned epicardial pacing wires are again noted. Multiple surgical clips throughout the upper abdomen or retroperitoneum.  IMPRESSION: 1.  Low lung volumes with probable bibasilar subsegmental atelectasis.  Developing air space consolidation from infection or aspiration is not excluded. 2.  Pulmonary venous congestion without frank pulmonary edema. 3.  Postoperative changes, as above.   Original Report Authenticated By: Trudie Reed, M.D.     History of Present Illness: Weakness  Hospital Course:  This is a 57 year old white female with a history of prior bilateral adrenalectomy. She had a nausea and vomiting illness  and was unable to keep her hydrocortisone down. She came in adrenal crisis with hypotension and electrolyte disorders. She was treated with intravenous steroids and fluids and has slowly improved. He's never been any evidence of a bowel obstruction or other serious GI disease. She is back eating and drinking with normal bowel movements. She's been somewhat weak. She had an episode of right-sided chest and breast pain yesterday. EKG and chest x-ray were remarkable for a cause. This is totally  resolved. This does not seem to be a cardiac based problem. Her oxygen saturations were normal and she had been on Lovenox for DVT prophylaxis. Given the resolution of the symptoms no further testing will be done. She is to be discharged on double dose steroids for 4 days and then go back to her usual dose. She is to resume all her prior medications. She's keep up on fluids. She has developed some dilutional anemia that will need a followup. Her electrolytes have normalized. Her blood sugars were mildly elevated with a high dose intravenous steroids. Her blood pressures normalized. She is symptom-free at the present. Day of Discharge Exam BP 106/63  Pulse 95  Temp(Src) 97.6 F (36.4 C) (Oral)  Resp 16  Ht 5' (1.524 m)  Wt 80.241 kg (176 lb 14.4 oz)  BMI 34.55 kg/m2  SpO2 97%  Physical Exam: General appearance: alert sitting up in no distress sclerae are anicteric exotropia is present Oral membranes are moist Resp: clear to auscultation bilaterally with no wheezes rales or rhonchi, no accessory muscles are in use Cardio: regular rate and rhythm with a systolic murmur. Sternal scar is noted. 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 Speech is clear. She is calm without tremor. Skin is warm and dry with some striae. Turgor is good.  Discharge Labs:  Recent Labs  01/08/13 0545 01/09/13 0638  NA 134* 139  K 5.0 4.0  CL 105 108  CO2 21 22  GLUCOSE 159* 140*  BUN 19 11  CREATININE 0.96 0.78  CALCIUM 9.2 8.8    Recent Labs  01/07/13 1419 01/09/13 0638  AST 23 16  ALT 13 9  ALKPHOS 63 44  BILITOT 0.6 0.1*  PROT 8.3 6.1  ALBUMIN 4.1 3.0*    Recent Labs  01/07/13 1419 01/08/13 0545 01/09/13 0638  WBC 7.9 9.2 11.6*  NEUTROABS 2.5  --   --   HGB 16.0* 12.7 10.2*  HCT 44.7  36.2 30.2*  MCV 85.1 85.6 87.3  PLT 217 261 258      Recent Labs  01/09/13 0638  TSH 0.670     Discharge instructions: Take a double dose of steroids for 4 days then resume your prior dose. Keep up on fluid intake. Call if nausea and vomiting occurs again.   Disposition to home  Follow-up Appts: Follow-up with Dr. [name] at [facility] in 2 weeks.  Call for appointment.  Condition on Discharge: Improved  Tests Needing Follow-up: None  Signed: Sevan Mcbroom [name] 01/10/2013, 9:27 AM

## 2013-01-10 NOTE — Progress Notes (Signed)
[name] to be D/C'd Home per MD order.  Discharge instructions reviewed and discussed with the patient, all questions and concerns answered. Copy of instructions and scripts given to patient. Patients skin is clean, dry and intact no evidence of skin break down.IV site discontinued and catheter remains intact. Site without signs and symptoms of complications. Dressing and pressure applied. Patient escorted to car by volunteer in a wheelchair,  no distress noted upon discharge.  Bing Quarry 01/10/2013 12:57 PM

## 2013-01-25 ENCOUNTER — Emergency Department (HOSPITAL_COMMUNITY): Payer: Medicaid Other

## 2013-01-25 ENCOUNTER — Encounter (HOSPITAL_COMMUNITY): Payer: Self-pay | Admitting: *Deleted

## 2013-01-25 ENCOUNTER — Emergency Department (HOSPITAL_COMMUNITY)
Admission: EM | Admit: 2013-01-25 | Discharge: 2013-01-26 | Disposition: A | Discharge status: Home / Self Care | Payer: Medicaid Other | Attending: Emergency Medicine | Admitting: Emergency Medicine

## 2013-01-25 DIAGNOSIS — Z79899 Other long term (current) drug therapy: Secondary | ICD-10-CM | POA: Insufficient documentation

## 2013-01-25 DIAGNOSIS — E079 Disorder of thyroid, unspecified: Secondary | ICD-10-CM | POA: Insufficient documentation

## 2013-01-25 DIAGNOSIS — K219 Gastro-esophageal reflux disease without esophagitis: Secondary | ICD-10-CM | POA: Insufficient documentation

## 2013-01-25 DIAGNOSIS — J45909 Unspecified asthma, uncomplicated: Secondary | ICD-10-CM | POA: Insufficient documentation

## 2013-01-25 DIAGNOSIS — R110 Nausea: Secondary | ICD-10-CM | POA: Insufficient documentation

## 2013-01-25 DIAGNOSIS — Z8719 Personal history of other diseases of the digestive system: Secondary | ICD-10-CM | POA: Insufficient documentation

## 2013-01-25 DIAGNOSIS — Z8739 Personal history of other diseases of the musculoskeletal system and connective tissue: Secondary | ICD-10-CM | POA: Insufficient documentation

## 2013-01-25 DIAGNOSIS — Z8673 Personal history of transient ischemic attack (TIA), and cerebral infarction without residual deficits: Secondary | ICD-10-CM | POA: Insufficient documentation

## 2013-01-25 DIAGNOSIS — R109 Unspecified abdominal pain: Secondary | ICD-10-CM | POA: Insufficient documentation

## 2013-01-25 DIAGNOSIS — Z8639 Personal history of other endocrine, nutritional and metabolic disease: Secondary | ICD-10-CM | POA: Insufficient documentation

## 2013-01-25 DIAGNOSIS — R630 Anorexia: Secondary | ICD-10-CM | POA: Insufficient documentation

## 2013-01-25 DIAGNOSIS — Z8709 Personal history of other diseases of the respiratory system: Secondary | ICD-10-CM | POA: Insufficient documentation

## 2013-01-25 DIAGNOSIS — Z8679 Personal history of other diseases of the circulatory system: Secondary | ICD-10-CM | POA: Insufficient documentation

## 2013-01-25 DIAGNOSIS — Z862 Personal history of diseases of the blood and blood-forming organs and certain disorders involving the immune mechanism: Secondary | ICD-10-CM | POA: Insufficient documentation

## 2013-01-25 LAB — CBC WITH DIFFERENTIAL/PLATELET
Basophils Absolute: 0 K/uL (ref 0.0–0.1)
Basophils Relative: 0 % (ref 0–1)
Eosinophils Absolute: 0.3 10*3/uL (ref 0.0–0.7)
Eosinophils Relative: 5 % (ref 0–5)
HCT: 41 % (ref 36.0–46.0)
Hemoglobin: 13.3 g/dL (ref 12.0–15.0)
Lymphocytes Relative: 48 % — ABNORMAL HIGH (ref 12–46)
Lymphs Abs: 3 10*3/uL (ref 0.7–4.0)
MCH: 30 pg (ref 26.0–34.0)
MCHC: 32.4 g/dL (ref 30.0–36.0)
MCV: 92.6 fL (ref 78.0–100.0)
Monocytes Absolute: 0.5 10*3/uL (ref 0.1–1.0)
Monocytes Relative: 7 % (ref 3–12)
Neutro Abs: 2.4 K/uL (ref 1.7–7.7)
Neutrophils Relative %: 39 % — ABNORMAL LOW (ref 43–77)
Platelets: 361 10*3/uL (ref 150–400)
RBC: 4.43 MIL/uL (ref 3.87–5.11)
RDW: 14.9 % (ref 11.5–15.5)
WBC: 6.2 10*3/uL (ref 4.0–10.5)

## 2013-01-25 LAB — POCT I-STAT, CHEM 8
BUN: 22 mg/dL (ref 6–23)
Calcium, Ion: 1.28 mmol/L — ABNORMAL HIGH (ref 1.12–1.23)
Chloride: 108 mEq/L (ref 96–112)
Creatinine, Ser: 0.9 mg/dL (ref 0.50–1.10)
Glucose, Bld: 101 mg/dL — ABNORMAL HIGH (ref 70–99)
HCT: 42 % (ref 36.0–46.0)
Hemoglobin: 14.3 g/dL (ref 12.0–15.0)
Potassium: 4.3 mEq/L (ref 3.5–5.1)
Sodium: 141 meq/L (ref 135–145)
TCO2: 27 mmol/L (ref 0–100)

## 2013-01-25 LAB — URINALYSIS, ROUTINE W REFLEX MICROSCOPIC
Bilirubin Urine: NEGATIVE
Glucose, UA: NEGATIVE mg/dL
Hgb urine dipstick: NEGATIVE
Ketones, ur: NEGATIVE mg/dL
Leukocytes, UA: NEGATIVE
Nitrite: NEGATIVE
Protein, ur: NEGATIVE mg/dL
Specific Gravity, Urine: 1.024 (ref 1.005–1.030)
Urobilinogen, UA: 0.2 mg/dL (ref 0.0–1.0)
pH: 5.5 (ref 5.0–8.0)

## 2013-01-25 LAB — LIPASE, BLOOD: Lipase: 31 U/L (ref 11–59)

## 2013-01-25 MED ORDER — ONDANSETRON HCL 4 MG/2ML IJ SOLN
4.0000 mg | Freq: Once | INTRAMUSCULAR | Status: AC
  Administered 2013-01-25: 4 mg via INTRAVENOUS

## 2013-01-25 MED ORDER — ONDANSETRON 4 MG PO TBDP: 4.0000 mg | ORAL_TABLET | Freq: Three times a day (TID) | ORAL | Status: DC | PRN

## 2013-01-25 MED ORDER — ONDANSETRON HCL 4 MG/2ML IJ SOLN
4.0000 mg | Freq: Once | INTRAMUSCULAR | Status: AC
  Administered 2013-01-25: 4 mg via INTRAVENOUS
  Filled 2013-01-25: qty 2

## 2013-01-25 MED ORDER — HYDROMORPHONE HCL PF 1 MG/ML IJ SOLN
0.5000 mg | Freq: Once | INTRAMUSCULAR | Status: AC
  Administered 2013-01-25: 0.5 mg via INTRAVENOUS
  Filled 2013-01-25: qty 1

## 2013-01-25 MED ORDER — ONDANSETRON HCL 4 MG/2ML IJ SOLN
INTRAMUSCULAR | Status: AC
  Filled 2013-01-25: qty 2

## 2013-01-25 NOTE — ED Notes (Signed)
Pt states her abdominal pain started about 2 hours ago, while she was sitting down,  And she is positive for nausea but denies diarrhea,  No fevers

## 2013-01-25 NOTE — ED Provider Notes (Signed)
History     CSN: [id number]  Arrival date & time 01/25/13  1821   First MD Initiated Contact with Patient 01/25/13 2010      Chief Complaint  Patient presents with  . Abdominal Pain  . Nausea    (Consider location/radiation/quality/duration/timing/severity/associated sxs/prior Treatment)  HPI Comments: [name] is a 57 y.o. 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 female presenting with abdominal pain. The history is provided by the patient and medical records.  Abdominal Pain Pain location:  Epigastric Pain quality: sharp   Pain radiates to:  Does not radiate Pain severity:  Mild Onset quality:  Sudden Duration:  2 hours Timing:  Intermittent Progression:  Unchanged Chronicity:  Recurrent (similar 3 weeks ago, but then was associated with N/V/D) Context: not alcohol use, not diet changes, not eating, not laxative use, not medication withdrawal, not previous surgeries, not recent travel, not sick contacts and not suspicious food intake   Relieved by:  Nothing Ineffective treatments:  Eating Associated symptoms: anorexia and nausea   Associated symptoms: no belching, no chest pain, no chills, no constipation, no cough, no diarrhea, no dysuria, no fatigue, no fever, no flatus, no hematemesis, no hematochezia, no hematuria, no melena, no shortness of breath, no sore throat, no vaginal bleeding, no vaginal discharge and no vomiting   Associated symptoms comment:  LNBM this am at 2 Risk factors: obesity and recent hospitalization   Risk factors: no alcohol abuse, no aspirin use, not elderly, has not had multiple surgeries, no NSAID use and not pregnant    Date of Admission:  01/07/2013  Date of Discharge: 01/10/2013  Attending Physician:SOUTH,STEPHEN [name]  Patient's WUJ:WJXBJ,Mikayla Mikayla Diener, MD  Consults: none  Discharge Diagnoses:  Principal Problem:  Adrenal crisis, now resolved  Active Problems:  Nausea & vomiting. Probable viral gastroenteritis  Adrenal insufficiency, with history of bilateral adrenalectomy. Known Carneys syndrome  Hyponatremia, improved  Anemia, mild likely dilutional  Chest pain, unspecified, with negative workup now resolved  Adjustment disorder with mixed anxiety and depressed mood GERD  Hypothyroidism, stable  Chronic headaches  Past Medical History  Diagnosis Date  . Thyroid disease   . Reflux   . 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 Blindness of left eye 1978  . Hyperlipemia   . Asthma   . Migraine   . Sinus headache   . Cushing's syndrome   . Blood transfusion   . Anemia   . Constipation   . Hemorrhoids   . Depression   . 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 Stomach ulcer     "from the hydrocortisone"  . Adrenal hypofunction     Past Surgical History  Procedure Laterality Date  . Adrenal gland surgery  1982    on hydrocortisone  . Atrial myxoma excision  1978  . Vaginal hysterectomy  2000    "partial; w/left ovary"  . Breast biopsy  07/1999    ductal tissue excision & bx right; bx only left breast  . Rotator cuff repair  05/2005    left  . Eye surgery    . Repaired cross eyed      "  as a child"    No family history on file.  History  Substance Use Topics  . Smoking status: Never Smoker   . Smokeless tobacco: Never Used  . Alcohol Use: No    OB History   Grav Para Term Preterm Abortions TAB SAB Ect Mult Living                  Review of Systems  Constitutional: Negative for fever, chills and fatigue.  HENT: Negative for sore throat.   Respiratory: Negative for cough and shortness of breath.   Cardiovascular: Negative for chest pain.   Gastrointestinal: Positive for nausea, abdominal pain and anorexia. Negative for vomiting, diarrhea, constipation, melena, hematochezia, flatus and hematemesis.  Genitourinary: Negative for dysuria, hematuria, vaginal bleeding and vaginal discharge.    Allergies  Aspirin; Ibuprofen; and Penicillins  Home Medications   Current Outpatient Rx  Name  Route  Sig  Dispense  Refill  . ALPRAZolam (XANAX) 0.5 MG tablet   Oral   Take 0.5 mg by mouth at bedtime.         . budesonide (RHINOCORT AQUA) 32 MCG/ACT nasal spray   Nasal   Place 1 spray into the nose daily.   1 Bottle   0   . hydrocortisone (CORTEF) 20 MG tablet   Oral   Take 10-20 mg by mouth 2 (two) times daily. Take 1 tablet every morning and 1/2 tablet every evening         . levothyroxine (SYNTHROID, LEVOTHROID) 75 MCG tablet   Oral   Take 75 mcg by mouth daily.         . metoprolol tartrate (LOPRESSOR) 25 MG tablet   Oral   Take 12.5 mg by mouth 2 (two) times daily.         Marland Kitchen omeprazole (PRILOSEC) 20 MG capsule   Oral   Take 20 mg by mouth 2 (two) times daily.         Marland Kitchen oxyCODONE-acetaminophen (PERCOCET/ROXICET) 5-325 MG per tablet   Oral   Take 1-2 tablets by mouth every 4 (four) hours as needed.   90 tablet   0   . venlafaxine (EFFEXOR-XR) 150 MG 24 hr capsule   Oral   Take 150 mg by mouth daily.           Marland Kitchen zolpidem (AMBIEN) 10 MG tablet   Oral   Take 10 mg by mouth at bedtime.           BP 109/69  Pulse 84  Temp(Src) 98.8 F (37.1 C) (Oral)  Resp 18  SpO2 100%  Physical Exam  Nursing note and vitals reviewed. Constitutional: She is oriented to person, place, and time. She appears well-developed and well-nourished.  Patient is nondiaphoretic and not distressed.  HENT:  Head: Normocephalic and atraumatic.  Eyes: Conjunctivae are normal.  Disconjugate left eye, chronic  Neck: Normal range of motion.  Full normal range of motion, supple  Cardiovascular:  Regular rate rhythm,  intact pulses.  Pulmonary/Chest: Effort normal.  Abdominal:  Obese soft abdomen with striae and laparoscopic surgical scars.  Epigastric and periumbilical abdominal tenderness to palpation.  No peritoneal signs.  Musculoskeletal: Normal range of motion.  Neurological: She is alert and oriented to person, place, and time.  Skin: Skin is warm and dry. No rash noted.  Psychiatric: She has a normal mood and affect. Her behavior is normal.    ED Course  Procedures (including critical care time)  Labs Reviewed  CBC WITH  DIFFERENTIAL - Abnormal; Notable for the following:    Neutrophils Relative % 39 (*)    Lymphocytes Relative 48 (*)    All other components within normal limits  POCT I-STAT, CHEM 8 - Abnormal; Notable for the following:    Glucose, Bld 101 (*)    Calcium, Ion 1.28 (*)    All other components within normal limits  LIPASE, BLOOD  URINALYSIS, ROUTINE W REFLEX MICROSCOPIC   US Abdomen Complete  01/25/2013   *RADIOLOGY REPORT*  Clinical Data:  Abdominal pain and nausea.  COMPLETE ABDOMINAL ULTRASOUND  Comparison:  08/02/2011  Findings:  Gallbladder:  No gallstones, gallbladder wall thickening, or pericholecystic fluid.  Common bile duct:  Measures 5 mm in diameter, within normal limits for age.  Liver:  No focal lesion identified.  Within normal limits in parenchymal echogenicity.  IVC:  Unremarkable where visualized.  Pancreas:  Portions of the pancreatic head were not well seen due to overlying bowel gas.  Otherwise normal.  Spleen:  Measures 4 cm craniocaudad and appears normal.  Right Kidney:  Measures 9.5 cm in length and contains a 1.6 cm mid to lower pole cyst.  Left Kidney:  Measures 9.3 cm in length and appears grossly unremarkable, although this portion is difficult to visualize due to patient body habitus.  Nonspecific hypoechoic lesion of the left kidney upper pole posteriorly.  Abdominal aorta:  No aneurysm identified.  IMPRESSION:  1.  A specific cause of patient's  abdominal pain nausea is not observed. 2.  Nonspecific hypoechoic lesion of the left kidney upper pole. This is probably a cyst but is difficult to characterize due to the patient's body habitus.  A slightly complex lesion is observed in this vicinity on prior CT exams.  This could be further characterized with renal protocol MRI with without contrast, if clinically warranted, for example if the patient has hematuria.   Original Report Authenticated By: Gaylyn Rong, M.D.    Date: 01/25/2013  Rate: 66  Rhythm: normal sinus rhythm  QRS Axis: normal  Intervals: normal  ST/T Wave abnormalities: normal  Conduction Disutrbances: none  Narrative Interpretation:   Old EKG Reviewed: No significant changes noted  No diagnosis found.   BP 109/69  Pulse 84  Temp(Src) 98.8 F (37.1 C) (Oral)  Resp 18  SpO2 35%  MDM  57 year old female with a history of multiple comorbidities including adrenal gland surgery on chronic steroids presented to the ER or nontoxic/septic appearing complaining of nausea and epigastric abdominal pain.  Labs and imaging reviewed without acute abnormalities.  Patient is tolerating by mouth without difficulty.  Repeat exam prior to discharge without significant abdominal tenderness or evidence of surgical abdomen.  Strict return percussions discussed if nausea and vomiting or fever develop.  Patient verbalizes understanding and appears reliable for followup with primary care physician first thing Monday morning.        Jaci Carrel, [place] 01/25/13 2335

## 2013-01-25 NOTE — ED Provider Notes (Signed)
Medical screening examination/treatment/procedure(s) were performed by non-physician practitioner and as supervising physician I was immediately available for consultation/collaboration.   Gavin Pound. Oletta Lamas, MD 01/25/13 2337

## 2013-01-25 NOTE — ED Notes (Signed)
EMS reports patient called for abdominal pain- onset about 30 minutes ago. History of same. Stable vitals.

## 2013-02-24 ENCOUNTER — Emergency Department (HOSPITAL_COMMUNITY)
Admission: EM | Admit: 2013-02-24 | Discharge: 2013-02-24 | Disposition: A | Discharge status: Home / Self Care | Payer: Medicaid Other | Attending: Emergency Medicine | Admitting: Emergency Medicine

## 2013-02-24 DIAGNOSIS — E079 Disorder of thyroid, unspecified: Secondary | ICD-10-CM | POA: Insufficient documentation

## 2013-02-24 DIAGNOSIS — Z8673 Personal history of transient ischemic attack (TIA), and cerebral infarction without residual deficits: Secondary | ICD-10-CM | POA: Insufficient documentation

## 2013-02-24 DIAGNOSIS — Z8639 Personal history of other endocrine, nutritional and metabolic disease: Secondary | ICD-10-CM | POA: Insufficient documentation

## 2013-02-24 DIAGNOSIS — M129 Arthropathy, unspecified: Secondary | ICD-10-CM | POA: Insufficient documentation

## 2013-02-24 DIAGNOSIS — Z8679 Personal history of other diseases of the circulatory system: Secondary | ICD-10-CM | POA: Insufficient documentation

## 2013-02-24 DIAGNOSIS — R112 Nausea with vomiting, unspecified: Secondary | ICD-10-CM

## 2013-02-24 DIAGNOSIS — K219 Gastro-esophageal reflux disease without esophagitis: Secondary | ICD-10-CM | POA: Insufficient documentation

## 2013-02-24 DIAGNOSIS — M2550 Pain in unspecified joint: Secondary | ICD-10-CM | POA: Insufficient documentation

## 2013-02-24 DIAGNOSIS — Z862 Personal history of diseases of the blood and blood-forming organs and certain disorders involving the immune mechanism: Secondary | ICD-10-CM | POA: Insufficient documentation

## 2013-02-24 DIAGNOSIS — K259 Gastric ulcer, unspecified as acute or chronic, without hemorrhage or perforation: Secondary | ICD-10-CM | POA: Insufficient documentation

## 2013-02-24 DIAGNOSIS — Z8719 Personal history of other diseases of the digestive system: Secondary | ICD-10-CM | POA: Insufficient documentation

## 2013-02-24 DIAGNOSIS — J45909 Unspecified asthma, uncomplicated: Secondary | ICD-10-CM | POA: Insufficient documentation

## 2013-02-24 DIAGNOSIS — H5440 Blindness, one eye, unspecified eye: Secondary | ICD-10-CM | POA: Insufficient documentation

## 2013-02-24 DIAGNOSIS — F329 Major depressive disorder, single episode, unspecified: Secondary | ICD-10-CM | POA: Insufficient documentation

## 2013-02-24 DIAGNOSIS — R109 Unspecified abdominal pain: Secondary | ICD-10-CM

## 2013-02-24 DIAGNOSIS — Z79899 Other long term (current) drug therapy: Secondary | ICD-10-CM | POA: Insufficient documentation

## 2013-02-24 DIAGNOSIS — Z7982 Long term (current) use of aspirin: Secondary | ICD-10-CM | POA: Insufficient documentation

## 2013-02-24 DIAGNOSIS — F3289 Other specified depressive episodes: Secondary | ICD-10-CM | POA: Insufficient documentation

## 2013-02-24 LAB — CBC WITH DIFFERENTIAL/PLATELET
Basophils Absolute: 0 10*3/uL (ref 0.0–0.1)
Basophils Relative: 1 % (ref 0–1)
Eosinophils Relative: 9 % — ABNORMAL HIGH (ref 0–5)
HCT: 43.7 % (ref 36.0–46.0)
Hemoglobin: 14.8 g/dL (ref 12.0–15.0)
Lymphocytes Relative: 57 % — ABNORMAL HIGH (ref 12–46)
MCHC: 33.9 g/dL (ref 30.0–36.0)
MCV: 91 fL (ref 78.0–100.0)
Monocytes Absolute: 0.7 10*3/uL (ref 0.1–1.0)
Monocytes Relative: 7 % (ref 3–12)
Neutro Abs: 2.3 10*3/uL (ref 1.7–7.7)
RDW: 13.8 % (ref 11.5–15.5)

## 2013-02-24 LAB — URINALYSIS, ROUTINE W REFLEX MICROSCOPIC
Nitrite: NEGATIVE
Protein, ur: NEGATIVE mg/dL
Specific Gravity, Urine: 1.024 (ref 1.005–1.030)
Urobilinogen, UA: 1 mg/dL (ref 0.0–1.0)

## 2013-02-24 LAB — COMPREHENSIVE METABOLIC PANEL
BUN: 25 mg/dL — ABNORMAL HIGH (ref 6–23)
CO2: 23 mEq/L (ref 19–32)
Calcium: 9.7 mg/dL (ref 8.4–10.5)
Chloride: 103 mEq/L (ref 96–112)
Creatinine, Ser: 0.96 mg/dL (ref 0.50–1.10)
GFR calc non Af Amer: 66 mL/min — ABNORMAL LOW (ref >90)
Total Bilirubin: 0.2 mg/dL — ABNORMAL LOW (ref 0.3–1.2)

## 2013-02-24 LAB — LIPASE, BLOOD: Lipase: 29 U/L (ref 11–59)

## 2013-02-24 LAB — URINE MICROSCOPIC-ADD ON

## 2013-02-24 MED ORDER — METHYLPREDNISOLONE SODIUM SUCC 125 MG IJ SOLR
125.0000 mg | Freq: Once | INTRAMUSCULAR | Status: AC
  Administered 2013-02-24: 125 mg via INTRAVENOUS
  Filled 2013-02-24: qty 2

## 2013-02-24 MED ORDER — ONDANSETRON HCL 4 MG/2ML IJ SOLN
4.0000 mg | INTRAMUSCULAR | Status: AC
  Administered 2013-02-24: 4 mg via INTRAVENOUS
  Filled 2013-02-24: qty 2

## 2013-02-24 MED ORDER — ONDANSETRON HCL 4 MG PO TABS: 4.0000 mg | ORAL_TABLET | Freq: Four times a day (QID) | ORAL | Status: DC

## 2013-02-24 MED ORDER — SODIUM CHLORIDE 0.9 % IV BOLUS (SEPSIS): 1000.0000 mL | Freq: Once | INTRAVENOUS | Status: DC

## 2013-02-24 MED ORDER — SODIUM CHLORIDE 0.9 % IV BOLUS (SEPSIS)
1000.0000 mL | Freq: Once | INTRAVENOUS | Status: AC
  Administered 2013-02-24: 1000 mL via INTRAVENOUS

## 2013-02-24 NOTE — ED Notes (Signed)
Pt alert, resp eve unlabored, skin pwd, b/p 85/54, ALP notified, orders obtained, cont to monitor

## 2013-02-24 NOTE — ED Notes (Signed)
Bed:WA05<BR> Expected date:<BR> Expected time:<BR> Means of arrival:<BR> Comments:<BR> EMS

## 2013-02-24 NOTE — ED Notes (Signed)
BJY:[id number]<FA> Expected date:<BR> Expected time:<BR> Means of arrival:<BR> Comments:<BR> Bed 5

## 2013-02-24 NOTE — ED Provider Notes (Signed)
Or thrills female comes in with vomiting and dizziness. She is steroid-dependent 24 adrenal surgery for Cushing's syndrome. NEG, nausea has been controlled and she has been given IV fluids. She was noted to have relative hypotension while sleeping but whenever she woke up, blood pressure came up. She was not orthostatic. She is resting comfortably and in no distress. I feel she can safely be discharged home with prescription for antiemetics. She was given an additional corticosteroid while in the ED.  Medical screening examination/treatment/procedure(s) were conducted as a shared visit with non-physician practitioner(s) and myself.  I personally evaluated the patient during the encounter   Dione Booze, MD 02/24/13 [phone]

## 2013-02-24 NOTE — ED Notes (Addendum)
Pt states she has had n/v x 2 days. 22G LPH. Fluids in truck hypotensive at 100/60. CBG 85.

## 2013-02-24 NOTE — ED Provider Notes (Signed)
History     CSN: [id number]  Arrival date & time 02/24/13  0005   First MD Initiated Contact with Patient 02/24/13 0020      Chief Complaint  Patient presents with  . Emesis  . Nausea    (Consider location/radiation/quality/duration/timing/severity/associated sxs/prior treatment) HPI Comments: Patient is a 57 y/o female with a number of comorbidities including Cushing's syndrome, asthma, migraine, thyroid disease, and atrial myxoma resection at age 71 who presents for abdominal pain x 1 day which she characterizes as an ache. States symptoms began as an ache under her L shoulder blade and progressed to an ache in her arm before settling as an ache in her LUQ. Patient admits to associated nausea and NB/NB emesis x 2. She admits to 1 BM today which was normal in color and consistency. Patient denies aggravating or alleviating factors of her symptoms as well as fever, CP, SOB, melena, hematochezia, urinary symptoms, and numbness or tingling in her extremities.  The history is provided by the patient. No language interpreter was used.    Past Medical History  Diagnosis Date  . Thyroid disease   . Reflux   . 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 Blindness of left eye 1978  . Hyperlipemia   . Asthma   . Migraine   . Sinus headache   . Cushing's syndrome   . Blood transfusion   . Anemia   . Constipation   . Hemorrhoids   . Depression   . 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 Stomach ulcer     "from the hydrocortisone"  . Adrenal hypofunction     Past Surgical History  Procedure Laterality Date  . Adrenal gland surgery  1982    on hydrocortisone  . Atrial myxoma excision  1978  . Vaginal hysterectomy  2000    "partial; w/left ovary"  . Breast biopsy  07/1999    ductal tissue excision & bx right; bx only left breast  . Rotator cuff repair  05/2005    left  . Eye surgery    . Repaired cross eyed      "as a child"    No family  history on file.  History  Substance Use Topics  . Smoking status: Never Smoker   . Smokeless tobacco: Never Used  . Alcohol Use: No    OB History   Grav Para Term Preterm Abortions TAB SAB Ect Mult Living                  Review of Systems  Constitutional: Negative for fever.  Eyes: Negative for visual disturbance.  Respiratory: Negative for shortness of breath.   Cardiovascular: Negative for chest pain.  Gastrointestinal: Positive for nausea, vomiting and abdominal pain (LUQ). Negative for diarrhea and blood in stool.  Genitourinary: Negative for dysuria and hematuria.  Musculoskeletal: Positive for myalgias (L shoulder and arm (resolved)).  Skin: Negative for color change.  Neurological: Negative for syncope, weakness and numbness.  All other systems reviewed and are negative.    Allergies  Aspirin; Ibuprofen; and Penicillins  Home Medications   Current Outpatient Rx  Name  Route  Sig  Dispense  Refill  . ALPRAZolam (XANAX) 0.5 MG tablet   Oral   Take 0.5 mg by mouth at bedtime.         Marland Kitchen aspirin 81 MG chewable tablet   Oral   Chew  81 mg by mouth daily.         . hydrocortisone (CORTEF) 20 MG tablet   Oral   Take 10-20 mg by mouth 2 (two) times daily. Take 1 tablet every morning and 1/2 tablet every evening         . levothyroxine (SYNTHROID, LEVOTHROID) 75 MCG tablet   Oral   Take 75 mcg by mouth daily.         . metoprolol tartrate (LOPRESSOR) 25 MG tablet   Oral   Take 12.5 mg by mouth 2 (two) times daily.         . Multiple Vitamin (MULTIVITAMIN WITH MINERALS) TABS   Oral   Take 1 tablet by mouth daily.         Marland Kitchen omeprazole (PRILOSEC) 20 MG capsule   Oral   Take 20 mg by mouth 2 (two) times daily.         Marland Kitchen venlafaxine (EFFEXOR-XR) 150 MG 24 hr capsule   Oral   Take 150 mg by mouth daily.           Marland Kitchen zolpidem (AMBIEN) 10 MG tablet   Oral   Take 10 mg by mouth at bedtime.         . ondansetron (ZOFRAN) 4 MG tablet    Oral   Take 1 tablet (4 mg total) by mouth every 6 (six) hours.   12 tablet   0     BP 113/68  Pulse 74  Temp(Src) 97.9 F (36.6 C) (Oral)  Resp 12  SpO2 100%  Physical Exam  Nursing note and vitals reviewed. Constitutional: She is oriented to person, place, and time. She appears well-developed and well-nourished. No distress.  Patient sleeping in hospital bed; in no discomfort or distress.  HENT:  Head: Normocephalic and atraumatic.  Mouth/Throat: Oropharynx is clear and moist. No oropharyngeal exudate.  Eyes: Conjunctivae and EOM are normal. No scleral icterus.  Neck: Normal range of motion. Neck supple.  Cardiovascular: Normal rate, regular rhythm, normal heart sounds and intact distal pulses.   Pulmonary/Chest: Effort normal and breath sounds normal. No respiratory distress. She has no wheezes. She has no rales.  Abdominal: Soft. She exhibits no distension and no mass. There is tenderness (mild, diffuse). There is no rebound and no guarding.  No peritoneal signs; No Murphy's sign or tenderness at McBurney's point.  Musculoskeletal: Normal range of motion. She exhibits no edema.  Lymphadenopathy:    She has no cervical adenopathy.  Neurological: She is alert and oriented to person, place, and time.  Skin: Skin is warm and dry. No rash noted. She is not diaphoretic. No erythema. No pallor.  Psychiatric: She has a normal mood and affect. Her behavior is normal.    ED Course  Procedures (including critical care time)  Labs Reviewed  CBC WITH DIFFERENTIAL - Abnormal; Notable for the following:    Neutrophils Relative % 26 (*)    Lymphocytes Relative 57 (*)    Lymphs Abs 5.1 (*)    Eosinophils Relative 9 (*)    Eosinophils Absolute 0.8 (*)    All other components within normal limits  COMPREHENSIVE METABOLIC PANEL - Abnormal; Notable for the following:    BUN 25 (*)    Total Bilirubin 0.2 (*)    GFR calc non Af Amer 66 (*)    GFR calc Af Amer 76 (*)    All other  components within normal limits  URINALYSIS, ROUTINE W REFLEX MICROSCOPIC - Abnormal; Notable for  the following:    Hgb urine dipstick TRACE (*)    Leukocytes, UA TRACE (*)    All other components within normal limits  LIPASE, BLOOD  URINE MICROSCOPIC-ADD ON    US Abdomen Complete  01/25/2013 *RADIOLOGY REPORT* Clinical Data: Abdominal pain and nausea. COMPLETE ABDOMINAL ULTRASOUND Comparison: 08/02/2011 Findings: Gallbladder: No gallstones, gallbladder wall thickening, or pericholecystic fluid. Common bile duct: Measures 5 mm in diameter, within normal limits for age. Liver: No focal lesion identified. Within normal limits in parenchymal echogenicity. IVC: Unremarkable where visualized. Pancreas: Portions of the pancreatic head were not well seen due to overlying bowel gas. Otherwise normal. Spleen: Measures 4 cm craniocaudad and appears normal. Right Kidney: Measures 9.5 cm in length and contains a 1.6 cm mid to lower pole cyst. Left Kidney: Measures 9.3 cm in length and appears grossly unremarkable, although this portion is difficult to visualize due to patient body habitus. Nonspecific hypoechoic lesion of the left kidney upper pole posteriorly. Abdominal aorta: No aneurysm identified. IMPRESSION: 1. A specific cause of patient's abdominal pain nausea is not observed. 2. Nonspecific hypoechoic lesion of the left kidney upper pole. This is probably a cyst but is difficult to characterize due to the patient's body habitus. A slightly complex lesion is observed in this vicinity on prior CT exams. This could be further characterized with renal protocol MRI with without contrast, if clinically warranted, for example if the patient has hematuria. Original Report Authenticated By: Gaylyn Rong, M.D.   No results found.   1. Abdominal pain   2. Nausea and vomiting      MDM  57 y/o female who presents for abdominal pain x 1 day which she characterizes as an ache; admits to associated nausea and  NB/NB emesis x 2. Physical exam with mild, diffuse abdominal TTP; no peritoneal signs, Murphy's sign or TTP at McBurney's point. Patient with prior U/S on 01/25/13 for similar symptoms which was reviewed and showed no acute abdominal findings. Workup to include CBC, CMP, lipase, and urinalysis. IVF and zofran ordered. Corticosteroids given in light of steroid dependency.   CBC without leukocytosis, anemia, or hemoconcentration. Liver and kidney function preserved and urinalysis without evidence of infection. Lipase normal. No worsening abdominal pain on reexamination of the abdomen. Patient with transient hypotension while sleeping; resolves upon waking. She is hemodynamically stable and not orthostatic. She is ambulatory in the ED without difficulty. Believe patient is appropriate for discharge with primary care and GI followup for further evaluation of symptoms. Indications for ED return discussed. Patient verbalizes comfort and understanding with plan with no unaddressed concerns. Patient seen also by Dr. Preston Fleeting who is in agreement with this management plan.      Antony Madura, PA-C 02/26/13 [phone]

## 2013-02-28 NOTE — ED Provider Notes (Signed)
Medical screening examination/treatment/procedure(s) were performed by non-physician practitioner and as supervising physician I was immediately available for consultation/collaboration.  Jahan Friedlander, MD 02/28/13 0712 

## 2013-03-08 ENCOUNTER — Observation Stay (HOSPITAL_COMMUNITY)
Admission: EM | Admit: 2013-03-08 | Discharge: 2013-03-09 | Disposition: A | Discharge status: Home / Self Care | Payer: Medicaid Other | Attending: Cardiovascular Disease | Admitting: Cardiovascular Disease

## 2013-03-08 ENCOUNTER — Encounter (HOSPITAL_COMMUNITY): Payer: Self-pay | Admitting: Emergency Medicine

## 2013-03-08 DIAGNOSIS — R9431 Abnormal electrocardiogram [ECG] [EKG]: Secondary | ICD-10-CM | POA: Insufficient documentation

## 2013-03-08 DIAGNOSIS — E860 Dehydration: Secondary | ICD-10-CM | POA: Insufficient documentation

## 2013-03-08 DIAGNOSIS — R51 Headache: Secondary | ICD-10-CM | POA: Insufficient documentation

## 2013-03-08 DIAGNOSIS — R1084 Generalized abdominal pain: Principal | ICD-10-CM | POA: Insufficient documentation

## 2013-03-08 DIAGNOSIS — E039 Hypothyroidism, unspecified: Secondary | ICD-10-CM | POA: Insufficient documentation

## 2013-03-08 DIAGNOSIS — R531 Weakness: Secondary | ICD-10-CM

## 2013-03-08 DIAGNOSIS — I2510 Atherosclerotic heart disease of native coronary artery without angina pectoris: Secondary | ICD-10-CM | POA: Insufficient documentation

## 2013-03-08 DIAGNOSIS — Z79899 Other long term (current) drug therapy: Secondary | ICD-10-CM | POA: Insufficient documentation

## 2013-03-08 DIAGNOSIS — E875 Hyperkalemia: Secondary | ICD-10-CM | POA: Insufficient documentation

## 2013-03-08 DIAGNOSIS — R42 Dizziness and giddiness: Secondary | ICD-10-CM | POA: Insufficient documentation

## 2013-03-08 LAB — BASIC METABOLIC PANEL
CO2: 12 mEq/L — ABNORMAL LOW (ref 19–32)
Chloride: 97 mEq/L (ref 96–112)
GFR calc Af Amer: 69 mL/min — ABNORMAL LOW (ref >90)
Potassium: 6 mEq/L — ABNORMAL HIGH (ref 3.5–5.1)
Sodium: 122 mEq/L — ABNORMAL LOW (ref 135–145)

## 2013-03-08 LAB — CBC
Hemoglobin: 14.9 g/dL (ref 12.0–15.0)
MCH: 30.7 pg (ref 26.0–34.0)
MCHC: 34.6 g/dL (ref 30.0–36.0)
RDW: 13.2 % (ref 11.5–15.5)

## 2013-03-08 LAB — CBC WITH DIFFERENTIAL/PLATELET
Eosinophils Absolute: 0.2 10*3/uL (ref 0.0–0.7)
Hemoglobin: 16 g/dL — ABNORMAL HIGH (ref 12.0–15.0)
Lymphocytes Relative: 37 % (ref 12–46)
Lymphs Abs: 2.2 10*3/uL (ref 0.7–4.0)
MCH: 31.1 pg (ref 26.0–34.0)
MCV: 89.1 fL (ref 78.0–100.0)
Monocytes Relative: 8 % (ref 3–12)
Neutrophils Relative %: 51 % (ref 43–77)
Platelets: 250 10*3/uL (ref 150–400)
RBC: 5.14 MIL/uL — ABNORMAL HIGH (ref 3.87–5.11)
WBC: 5.8 10*3/uL (ref 4.0–10.5)

## 2013-03-08 LAB — POCT I-STAT, CHEM 8
BUN: 35 mg/dL — ABNORMAL HIGH (ref 6–23)
Chloride: 102 mEq/L (ref 96–112)
HCT: 50 % — ABNORMAL HIGH (ref 36.0–46.0)
Potassium: 6.1 mEq/L — ABNORMAL HIGH (ref 3.5–5.1)

## 2013-03-08 LAB — POCT I-STAT TROPONIN I: Troponin i, poc: 0 ng/mL (ref 0.00–0.08)

## 2013-03-08 LAB — TROPONIN I: Troponin I: <0.3 ng/mL

## 2013-03-08 MED ORDER — VENLAFAXINE HCL ER 150 MG PO CP24
150.0000 mg | ORAL_CAPSULE | Freq: Every day | ORAL | Status: DC
  Administered 2013-03-09: 150 mg via ORAL
  Filled 2013-03-08: qty 1

## 2013-03-08 MED ORDER — HYDROCODONE-ACETAMINOPHEN 5-325 MG PO TABS: 1.0000 | ORAL_TABLET | ORAL | Status: DC | PRN

## 2013-03-08 MED ORDER — ALPRAZOLAM 0.5 MG PO TABS
0.5000 mg | ORAL_TABLET | Freq: Every day | ORAL | Status: DC
  Administered 2013-03-08: 0.5 mg via ORAL
  Filled 2013-03-08: qty 1

## 2013-03-08 MED ORDER — SODIUM CHLORIDE 0.9 % IV SOLN: INTRAVENOUS | Status: DC

## 2013-03-08 MED ORDER — ALUM & MAG HYDROXIDE-SIMETH 200-200-20 MG/5ML PO SUSP: 30.0000 mL | Freq: Four times a day (QID) | ORAL | Status: DC | PRN

## 2013-03-08 MED ORDER — HEPARIN SODIUM (PORCINE) 5000 UNIT/ML IJ SOLN
5000.0000 [IU] | Freq: Three times a day (TID) | INTRAMUSCULAR | Status: DC
  Administered 2013-03-08: 5000 [IU] via SUBCUTANEOUS
  Filled 2013-03-08 (×5): qty 1

## 2013-03-08 MED ORDER — DOCUSATE SODIUM 100 MG PO CAPS
100.0000 mg | ORAL_CAPSULE | Freq: Two times a day (BID) | ORAL | Status: DC
  Administered 2013-03-08 – 2013-03-09 (×2): 100 mg via ORAL
  Filled 2013-03-08 (×3): qty 1

## 2013-03-08 MED ORDER — ADULT MULTIVITAMIN W/MINERALS CH
1.0000 | ORAL_TABLET | Freq: Every day | ORAL | Status: DC
  Administered 2013-03-09: 1 via ORAL
  Filled 2013-03-08: qty 1

## 2013-03-08 MED ORDER — ONDANSETRON HCL 4 MG/2ML IJ SOLN: 4.0000 mg | Freq: Four times a day (QID) | INTRAMUSCULAR | Status: DC | PRN

## 2013-03-08 MED ORDER — ASPIRIN 81 MG PO CHEW: 243.0000 mg | CHEWABLE_TABLET | Freq: Once | ORAL | Status: DC

## 2013-03-08 MED ORDER — ZOLPIDEM TARTRATE 5 MG PO TABS
10.0000 mg | ORAL_TABLET | Freq: Every day | ORAL | Status: DC
  Administered 2013-03-08: 10 mg via ORAL
  Filled 2013-03-08: qty 2

## 2013-03-08 MED ORDER — HYDROCORTISONE 20 MG PO TABS
20.0000 mg | ORAL_TABLET | Freq: Every day | ORAL | Status: DC
Start: 2013-03-09 — End: 2013-03-09
  Administered 2013-03-09: 20 mg via ORAL
  Filled 2013-03-08: qty 1

## 2013-03-08 MED ORDER — ACETAMINOPHEN 325 MG PO TABS: 650.0000 mg | ORAL_TABLET | Freq: Four times a day (QID) | ORAL | Status: DC | PRN

## 2013-03-08 MED ORDER — METOPROLOL TARTRATE 12.5 MG HALF TABLET
12.5000 mg | ORAL_TABLET | Freq: Two times a day (BID) | ORAL | Status: DC
  Administered 2013-03-08 – 2013-03-09 (×2): 12.5 mg via ORAL
  Filled 2013-03-08 (×3): qty 1

## 2013-03-08 MED ORDER — SODIUM CHLORIDE 0.9 % IV BOLUS (SEPSIS)
1000.0000 mL | Freq: Once | INTRAVENOUS | Status: AC
  Administered 2013-03-08: 1000 mL via INTRAVENOUS

## 2013-03-08 MED ORDER — SODIUM CHLORIDE 0.9 % IV SOLN
INTRAVENOUS | Status: DC
  Administered 2013-03-08: 19:00:00 via INTRAVENOUS

## 2013-03-08 MED ORDER — PANTOPRAZOLE SODIUM 40 MG PO TBEC
40.0000 mg | DELAYED_RELEASE_TABLET | Freq: Every day | ORAL | Status: DC
  Administered 2013-03-08 – 2013-03-09 (×2): 40 mg via ORAL
  Filled 2013-03-08 (×2): qty 1

## 2013-03-08 MED ORDER — ASPIRIN 81 MG PO CHEW
243.0000 mg | CHEWABLE_TABLET | Freq: Once | ORAL | Status: AC
  Administered 2013-03-08: 243 mg via ORAL
  Filled 2013-03-08: qty 3

## 2013-03-08 MED ORDER — ONDANSETRON HCL 4 MG PO TABS
4.0000 mg | ORAL_TABLET | Freq: Four times a day (QID) | ORAL | Status: DC
  Administered 2013-03-08 – 2013-03-09 (×2): 4 mg via ORAL
  Filled 2013-03-08 (×6): qty 1

## 2013-03-08 MED ORDER — LEVOTHYROXINE SODIUM 75 MCG PO TABS
75.0000 ug | ORAL_TABLET | Freq: Every day | ORAL | Status: DC
  Administered 2013-03-09: 75 ug via ORAL
  Filled 2013-03-08 (×2): qty 1

## 2013-03-08 MED ORDER — ACETAMINOPHEN 650 MG RE SUPP: 650.0000 mg | Freq: Four times a day (QID) | RECTAL | Status: DC | PRN

## 2013-03-08 MED ORDER — HYDROCORTISONE 10 MG PO TABS
10.0000 mg | ORAL_TABLET | Freq: Every day | ORAL | Status: DC
  Administered 2013-03-08: 10 mg via ORAL
  Filled 2013-03-08 (×2): qty 1

## 2013-03-08 MED ORDER — SODIUM CHLORIDE 0.9 % IJ SOLN: 3.0000 mL | Freq: Two times a day (BID) | INTRAMUSCULAR | Status: DC

## 2013-03-08 MED ORDER — ONDANSETRON HCL 4 MG PO TABS: 4.0000 mg | ORAL_TABLET | Freq: Four times a day (QID) | ORAL | Status: DC | PRN

## 2013-03-08 NOTE — ED Notes (Signed)
Pt playing game on phone, asked to put it down so staff can assess her and put her on the monitor. Pt refusing.

## 2013-03-08 NOTE — ED Provider Notes (Signed)
History    CSN: [id number] Arrival date & time 03/08/13  1457  First MD Initiated Contact with Patient 03/08/13 1502     Chief Complaint  Patient presents with  . Dizziness   (Consider location/radiation/quality/duration/timing/severity/associated sxs/prior Treatment) HPI  Patient became lightheaded after standing in hot sun this afternoon. Also developed headache after standing in hot sun this afternoon. Headache was not onset 1 AM... She's presently hungry and thirsty. EMS treat patient with supplemental oxygen. No chest pain . She does complain of abdominal discomfort Past Medical History  Diagnosis Date  . Reflux   . 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 Blindness of left eye 1978  . Hyperlipemia   . Asthma   . Migraine   . Sinus headache   . Cushing's syndrome   . Blood transfusion   . Anemia   . Constipation   . Hemorrhoids   . Depression   . 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 Stomach ulcer     "from the hydrocortisone"  . Adrenal hypofunction   . Thyroid disease     pt unsure if hyper or hypo???   Past Surgical History  Procedure Laterality Date  . Adrenal gland surgery  1982    on hydrocortisone  . Atrial myxoma excision  1978  . Vaginal hysterectomy  2000    "partial; w/left ovary"  . Breast biopsy  07/1999    ductal tissue excision & bx right; bx only left breast  . Rotator cuff repair  05/2005    left  . Eye surgery    . Repaired cross eyed      "as a child"   No family history on file. History  Substance Use Topics  . Smoking status: Never Smoker   . Smokeless tobacco: Never Used  . Alcohol Use: No   OB History   Grav Para Term Preterm Abortions TAB SAB Ect Mult Living                 Review of Systems  Eyes: Positive for visual disturbance.       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   Cardiovascular: Negative.   Gastrointestinal:  Negative.   Musculoskeletal: Negative.   Skin: Negative.   Neurological: Positive for weakness and headaches.  Psychiatric/Behavioral: Negative.   All other systems reviewed and are negative.    Allergies  Aspirin; Ibuprofen; and Penicillins  Home Medications   Current Outpatient Rx  Name  Route  Sig  Dispense  Refill  . ALPRAZolam (XANAX) 0.5 MG tablet   Oral   Take 0.5 mg by mouth at bedtime.         Marland Kitchen aspirin 81 MG chewable tablet   Oral   Chew 81 mg by mouth daily.         . hydrocortisone (CORTEF) 20 MG tablet   Oral   Take 10-20 mg by mouth 2 (two) times daily. Take 1 tablet every morning and 1/2 tablet every evening         . levothyroxine (SYNTHROID, LEVOTHROID) 75 MCG tablet   Oral   Take 75 mcg by mouth daily.         . metoprolol tartrate (LOPRESSOR) 25 MG tablet   Oral   Take 12.5 mg by mouth 2 (two) times daily.         Marland Kitchen  Multiple Vitamin (MULTIVITAMIN WITH MINERALS) TABS   Oral   Take 1 tablet by mouth daily.         Marland Kitchen omeprazole (PRILOSEC) 20 MG capsule   Oral   Take 20 mg by mouth 2 (two) times daily.         . ondansetron (ZOFRAN) 4 MG tablet   Oral   Take 1 tablet (4 mg total) by mouth every 6 (six) hours.   12 tablet   0   . venlafaxine (EFFEXOR-XR) 150 MG 24 hr capsule   Oral   Take 150 mg by mouth daily.           Marland Kitchen zolpidem (AMBIEN) 10 MG tablet   Oral   Take 10 mg by mouth at bedtime.          BP 92/59  Pulse 67  Temp(Src) 98.9 F (37.2 C) (Oral)  Resp 18  SpO2 99% Physical Exam  Nursing note and vitals reviewed. Constitutional: She is oriented to person, place, and time. She appears well-developed and well-nourished.  HENT:  Head: Normocephalic and atraumatic.  Eyes:   Left eye with lateral strabismus  Neck: Neck supple. No tracheal deviation present. No thyromegaly present.  Cardiovascular: Normal rate and regular rhythm.   No murmur heard. Pulmonary/Chest: Effort normal and breath sounds normal.   Abdominal: Soft. Bowel sounds are normal. She exhibits no distension. There is no tenderness.  Musculoskeletal: Normal range of motion. She exhibits no edema and no tenderness.  Neurological: She is alert and oriented to person, place, and time. Coordination normal.  Skin: Skin is warm and dry. No rash noted.  Psychiatric: She has a normal mood and affect.    ED Course  Procedures (including critical care time) Labs Reviewed - No data to display No results found. No diagnosis found.  Date: 03/08/2013  Rate: 95  Rhythm: normal sinus rhythm  QRS Axis: normal  Intervals: normal  ST/T Wave abnormalities: Anterior wall ischemic changes new from 02/24/2013 interpreted by me patient also with new Q waves in inferior leads from 02/24/2013 interpreted by me  Conduction Disutrbances:none  Narrative Interpretation:   Old EKG Reviewed: changes noted Results for orders placed during the hospital encounter of 03/08/13  CBC WITH DIFFERENTIAL      Result Value Range   WBC 5.8  4.0 - 10.5 K/uL   RBC 5.14 (*) 3.87 - 5.11 MIL/uL   Hemoglobin 16.0 (*) 12.0 - 15.0 g/dL   HCT 04.5  40.9 - 81.1 %   MCV 89.1  78.0 - 100.0 fL   MCH 31.1  26.0 - 34.0 pg   MCHC 34.9  30.0 - 36.0 g/dL   RDW 91.4  78.2 - 95.6 %   Platelets 250  150 - 400 K/uL   Neutrophils Relative % 51  43 - 77 %   Neutro Abs 3.0  1.7 - 7.7 K/uL   Lymphocytes Relative 37  12 - 46 %   Lymphs Abs 2.2  0.7 - 4.0 K/uL   Monocytes Relative 8  3 - 12 %   Monocytes Absolute 0.5  0.1 - 1.0 K/uL   Eosinophils Relative 3  0 - 5 %   Eosinophils Absolute 0.2  0.0 - 0.7 K/uL   Basophils Relative 0  0 - 1 %   Basophils Absolute 0.0  0.0 - 0.1 K/uL  POCT I-STAT TROPONIN I      Result Value Range   Troponin i, poc 0.00  0.00 - 0.08 ng/mL  Comment 3           POCT I-STAT, CHEM 8      Result Value Range   Sodium 132 (*) 135 - 145 mEq/L   Potassium 6.1 (*) 3.5 - 5.1 mEq/L   Chloride 102  96 - 112 mEq/L   BUN 35 (*) 6 - 23 mg/dL    Creatinine, Ser 1.61 (*) 0.50 - 1.10 mg/dL   Glucose, Bld 93  70 - 99 mg/dL   Calcium, Ion 0.96  0.45 - 1.23 mmol/L   TCO2 25  0 - 100 mmol/L   Hemoglobin 17.0 (*) 12.0 - 15.0 g/dL   HCT 40.9 (*) 81.1 - 91.4 %   Comment NOTIFIED PHYSICIAN     No results found.  MDM   concerned about silent cardiac ischemia Spoke with Dr.Kadakia who will evaluate patient for inpatient stay. I-STAT 8 possibly factitious. will obtain another blood sample to recheck potassium and renal function Dx #1 weakness #2 abnormal EKG     Doug Sou, MD 03/08/13 Rickey Primus

## 2013-03-08 NOTE — ED Notes (Signed)
Pt sts she was walking across the street today, it was hot out, when she became lightheaded and started sweating. Pt sts she thinks she got overheated today. Reports she has had a HA since she woke up this morning, still in same location, frontal. Pt in nad, skin warm and dry, resp e/u.

## 2013-03-08 NOTE — H&P (Signed)
[name] is an 57 y.o. female.   Chief Complaint: Abdominal pain and abnormal EKG HPI: 57 years old female had generalized abdominal discomfort, headache and lightheadedness after standing in sun this afternoon. EKG in ER shows anterior ischemia. Cardiac cath last year was without significant coronary artery disease.  Past Medical History  Diagnosis Date  . Reflux   . 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 Blindness of left eye 1978  . Hyperlipemia   . Asthma   . Migraine   . Sinus headache   . Cushing's syndrome   . Blood transfusion   . Anemia   . Constipation   . Hemorrhoids   . Depression   . 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 Stomach ulcer     "from the hydrocortisone"  . Adrenal hypofunction   . Thyroid disease     pt unsure if hyper or hypo???      Past Surgical History  Procedure Laterality Date  . Adrenal gland surgery  1982    on hydrocortisone  . Atrial myxoma excision  1978  . Vaginal hysterectomy  2000    "partial; w/left ovary"  . Breast biopsy  07/1999    ductal tissue excision & bx right; bx only left breast  . Rotator cuff repair  05/2005    left  . Eye surgery    . Repaired cross eyed      "as a child"    No family history on file. Social History:  reports that she has never smoked. She has never used smokeless tobacco. She reports that she does not drink alcohol or use illicit drugs.  Allergies:  Allergies  Allergen Reactions  . Aspirin     Can take baby aspirin  . Ibuprofen (Ibuprofen) Nausea And Vomiting  . Penicillins Nausea And Vomiting     (Not in a hospital admission)  Results for orders placed during the hospital encounter of 03/08/13 (from the past 48 hour(s))  CBC WITH DIFFERENTIAL     Status: Abnormal   Collection Time    03/08/13  3:40 PM      Result Value Range   WBC 5.8  4.0 - 10.5 K/uL   RBC 5.14 (*) 3.87 - 5.11 MIL/uL   Hemoglobin 16.0 (*) 12.0 - 15.0 g/dL   HCT  14.7  82.9 - 56.2 %   MCV 89.1  78.0 - 100.0 fL   MCH 31.1  26.0 - 34.0 pg   MCHC 34.9  30.0 - 36.0 g/dL   RDW 13.0  86.5 - 78.4 %   Platelets 250  150 - 400 K/uL   Neutrophils Relative % 51  43 - 77 %   Neutro Abs 3.0  1.7 - 7.7 K/uL   Lymphocytes Relative 37  12 - 46 %   Lymphs Abs 2.2  0.7 - 4.0 K/uL   Monocytes Relative 8  3 - 12 %   Monocytes Absolute 0.5  0.1 - 1.0 K/uL   Eosinophils Relative 3  0 - 5 %   Eosinophils Absolute 0.2  0.0 - 0.7 K/uL   Basophils Relative 0  0 - 1 %   Basophils Absolute 0.0  0.0 - 0.1 K/uL  POCT I-STAT TROPONIN I     Status: None   Collection Time    03/08/13  5:21 PM      Result Value Range   Troponin  i, poc 0.00  0.00 - 0.08 ng/mL   Comment 3            Comment: Due to the release kinetics of cTnI,     a negative result within the first hours     of the onset of symptoms does not rule out     myocardial infarction with certainty.     If myocardial infarction is still suspected,     repeat the test at appropriate intervals.  POCT I-STAT, CHEM 8     Status: Abnormal   Collection Time    03/08/13  5:44 PM      Result Value Range   Sodium 132 (*) 135 - 145 mEq/L   Potassium 6.1 (*) 3.5 - 5.1 mEq/L   Chloride 102  96 - 112 mEq/L   BUN 35 (*) 6 - 23 mg/dL   Creatinine, Ser 2.13 (*) 0.50 - 1.10 mg/dL   Glucose, Bld 93  70 - 99 mg/dL   Calcium, Ion 0.86  5.78 - 1.23 mmol/L   TCO2 25  0 - 100 mmol/L   Hemoglobin 17.0 (*) 12.0 - 15.0 g/dL   HCT 46.9 (*) 62.9 - 52.8 %   Comment NOTIFIED PHYSICIAN     No results found.  @ROS @ General: Complains of headache, anorexia, fatigue, malaise, sleep disturbance.  Denies fevers, chills, sweats.  Eyes: Denies blurring, vision loss.   ENT: Denies earache, ear discharge, sore throat, hoarseness.  Cardiovascular: Denies chest pains, palpitations, syncope.  Respiratory: Denies dyspnea, hemoptysis, wheezing.  Gastrointestinal: Complains of nausea and abdominal pain. Denies diarrhea, melena, hematochezia,  jaundice.  Genitourinary: Denies vaginal discharge, dysuria, hematuria, abnormal vaginal bleeding.  Musculoskeletal: Denies back pain, joint pain, muscle cramps, muscle weakness.  Neurologic: Complains of dizziness. Denies paresthesias, syncope, tremors, vertigo.  Endocrine: Denies cold intolerance, heat intolerance, polydipsia, polyphagia, polyuria.   Blood pressure 133/72, pulse 88, temperature 98.9 F (37.2 C), temperature source Oral, resp. rate 23, SpO2 100.00%.  Constitutional: She is oriented to person, place, and time. She appears well-developed and well-nourished.  HENT: Head: Normocephalic and atraumatic. Eyes: Brown eyes Left eye with lateral strabismus  Neck: Neck supple. No tracheal deviation present. No thyromegaly present.  Cardiovascular: Normal rate and regular rhythm.  No murmur heard.  Pulmonary/Chest: Effort normal and breath sounds normal.  Abdominal: Soft. Bowel sounds are normal. She exhibits no distension. There is no tenderness.  Musculoskeletal: Normal range of motion. She exhibits no edema and no tenderness.  Neurological: She is alert and oriented to person, place, and time. Coordination normal.  Skin: Skin is warm and dry. No rash noted.  Psychiatric: She has a normal mood and affect.   Assessment/Plan Abdominal pain Dizziness  H/O adrenal hypofunction Hypothyroidism Abnormal EKG CAD Hyperkalemia  Place in observation IV fluids/Repeat lab for hyperkalemia Home medications. May need solumedrol if hypotensive/adrenal crisis R/O MI May place PICC line if needed.   [name] S 03/08/2013, 6:53 PM

## 2013-03-08 NOTE — ED Notes (Signed)
Spoke with [facility] staff, sent tube down after i-stat troponin was collected, will get it back from main lab and run the chem-8 as soon as possible.

## 2013-03-08 NOTE — ED Notes (Signed)
Pt ambulated to restroom with no issues.  

## 2013-03-08 NOTE — ED Notes (Signed)
Per EMS - pt c/o dizziness and sweating that started 10 mins ago while she was walking across the street, HA started at 1am this morning. CBG 210, denies eating anything today. BP 106/71 HR 92 16RR 97% on 2liters/min. Pt denies sob, lung sounds clear. sts she normally has low BP.

## 2013-03-09 LAB — TROPONIN I
Troponin I: <0.3 ng/mL (ref <0.30)
Troponin I: <0.3 ng/mL (ref <0.30)

## 2013-03-09 LAB — CBC
MCH: 30.9 pg (ref 26.0–34.0)
MCHC: 34.5 g/dL (ref 30.0–36.0)
Platelets: 249 10*3/uL (ref 150–400)
RBC: 4.7 MIL/uL (ref 3.87–5.11)

## 2013-03-09 LAB — BASIC METABOLIC PANEL
BUN: 24 mg/dL — ABNORMAL HIGH (ref 6–23)
Calcium: 9.4 mg/dL (ref 8.4–10.5)
GFR calc Af Amer: 61 mL/min — ABNORMAL LOW (ref >90)
GFR calc non Af Amer: 52 mL/min — ABNORMAL LOW (ref >90)
Glucose, Bld: 106 mg/dL — ABNORMAL HIGH (ref 70–99)
Sodium: 131 mEq/L — ABNORMAL LOW (ref 135–145)

## 2013-03-09 NOTE — Discharge Summary (Signed)
Physician Discharge Summary  Patient ID: [name] MRN: [id number] DOB/AGE: 1956/02/15 57 y.o.  Admit date: 03/08/2013 Discharge date: 03/09/2013  Admission Diagnoses: Abdominal pain  Dizziness  H/O adrenal hypofunction  Hypothyroidism  Abnormal EKG  CAD  Hyperkalemia  Discharge Diagnoses:  Principle Problem: * Abdominal pain * Dehydration Dizziness  H/O adrenal hypofunction  Hypothyroidism  Abnormal EKG  CAD  Hyperkalemia-resolved   Discharged Condition: fair  Hospital Course: 57 years old female had generalized abdominal discomfort, headache and lightheadedness after standing in sun this afternoon. Her cardiac enzymes are normal x 3. Her abdominal discomfort and weakness resolved with rest and IV fluids. She also has history of adrenal insufficiency. She had non-significant CAD on cardiac cath 1 year ago in spite of abnormal nuclear stress trest. She will be followed by Dr. Shana Chute in 1 week.  Consults: None  Significant Diagnostic Studies: labs: Normal CBC and low sodium of 122 and high potassium of 6.0. These improved with IV fluids.  Treatments: IV hydration.  Discharge Exam: Blood pressure 84/69, pulse 82, temperature 98.4 F (36.9 C), temperature source Oral, resp. rate 18, height 5' (1.524 m), weight 83.4 kg (183 lb 13.8 oz), SpO2 96.00%. Constitutional: She is oriented to person, place, and time. She appears well-developed and well-nourished.  HENT: Head: Normocephalic and atraumatic. Eyes: Brown eyes Left eye with lateral strabismus  Neck: Neck supple. No tracheal deviation present. No thyromegaly present.  Cardiovascular: Normal rate and regular rhythm.  No murmur heard.  Pulmonary/Chest: Effort normal and breath sounds normal.  Abdominal: Soft. Bowel sounds are normal. She exhibits no distension. There is no tenderness.  Musculoskeletal: Normal range of motion. She exhibits no edema and no tenderness.  Neurological: She is alert and oriented to  person, place, and time. Coordination normal.  Skin: Skin is warm and dry. No rash noted.  Psychiatric: She has a normal mood and affect.    Disposition: 01-Home or Self Care     Medication List    STOP taking these medications       zolpidem 10 MG tablet  Commonly known as:  AMBIEN      TAKE these medications       ALPRAZolam 0.5 MG tablet  Commonly known as:  XANAX  Take 0.5 mg by mouth at bedtime.     aspirin 81 MG chewable tablet  Chew 81 mg by mouth daily.     hydrocortisone 20 MG tablet  Commonly known as:  CORTEF  Take 10-20 mg by mouth 2 (two) times daily. Take 1 tablet every morning and 1/2 tablet every evening     levothyroxine 75 MCG tablet  Commonly known as:  SYNTHROID, LEVOTHROID  Take 75 mcg by mouth daily.     metoprolol tartrate 25 MG tablet  Commonly known as:  LOPRESSOR  Take 12.5 mg by mouth 2 (two) times daily.     multivitamin with minerals Tabs  Take 1 tablet by mouth daily.     omeprazole 20 MG capsule  Commonly known as:  PRILOSEC  Take 20 mg by mouth 2 (two) times daily.     ondansetron 4 MG tablet  Commonly known as:  ZOFRAN  Take 1 tablet (4 mg total) by mouth every 6 (six) hours.     venlafaxine XR 150 MG 24 hr capsule  Commonly known as:  EFFEXOR-XR  Take 150 mg by mouth daily.         SignedOrpah Cobb S 03/09/2013, 3:09 PM

## 2013-03-09 NOTE — Care Management Utilization Note (Signed)
Utilization review completed.  

## 2013-08-15 ENCOUNTER — Other Ambulatory Visit: Payer: Self-pay | Admitting: Endocrinology

## 2013-08-15 DIAGNOSIS — Z1231 Encounter for screening mammogram for malignant neoplasm of breast: Secondary | ICD-10-CM

## 2013-11-02 ENCOUNTER — Emergency Department (HOSPITAL_COMMUNITY): Payer: Medicaid Other

## 2013-11-02 ENCOUNTER — Emergency Department (HOSPITAL_COMMUNITY)
Admission: EM | Admit: 2013-11-02 | Discharge: 2013-11-02 | Disposition: A | Discharge status: Home / Self Care | Payer: Medicaid Other | Attending: Emergency Medicine | Admitting: Emergency Medicine

## 2013-11-02 ENCOUNTER — Encounter (HOSPITAL_COMMUNITY): Payer: Self-pay | Admitting: Emergency Medicine

## 2013-11-02 DIAGNOSIS — J45909 Unspecified asthma, uncomplicated: Secondary | ICD-10-CM | POA: Insufficient documentation

## 2013-11-02 DIAGNOSIS — Z8673 Personal history of transient ischemic attack (TIA), and cerebral infarction without residual deficits: Secondary | ICD-10-CM | POA: Insufficient documentation

## 2013-11-02 DIAGNOSIS — R42 Dizziness and giddiness: Secondary | ICD-10-CM | POA: Diagnosis present

## 2013-11-02 DIAGNOSIS — R51 Headache: Secondary | ICD-10-CM | POA: Insufficient documentation

## 2013-11-02 DIAGNOSIS — M129 Arthropathy, unspecified: Secondary | ICD-10-CM | POA: Insufficient documentation

## 2013-11-02 DIAGNOSIS — Z8711 Personal history of peptic ulcer disease: Secondary | ICD-10-CM | POA: Insufficient documentation

## 2013-11-02 DIAGNOSIS — Z8669 Personal history of other diseases of the nervous system and sense organs: Secondary | ICD-10-CM | POA: Insufficient documentation

## 2013-11-02 DIAGNOSIS — Z79899 Other long term (current) drug therapy: Secondary | ICD-10-CM | POA: Insufficient documentation

## 2013-11-02 DIAGNOSIS — G43909 Migraine, unspecified, not intractable, without status migrainosus: Secondary | ICD-10-CM | POA: Insufficient documentation

## 2013-11-02 DIAGNOSIS — R519 Headache, unspecified: Secondary | ICD-10-CM | POA: Diagnosis present

## 2013-11-02 DIAGNOSIS — K219 Gastro-esophageal reflux disease without esophagitis: Secondary | ICD-10-CM | POA: Insufficient documentation

## 2013-11-02 DIAGNOSIS — Z862 Personal history of diseases of the blood and blood-forming organs and certain disorders involving the immune mechanism: Secondary | ICD-10-CM | POA: Insufficient documentation

## 2013-11-02 DIAGNOSIS — F329 Major depressive disorder, single episode, unspecified: Secondary | ICD-10-CM | POA: Insufficient documentation

## 2013-11-02 DIAGNOSIS — E079 Disorder of thyroid, unspecified: Secondary | ICD-10-CM | POA: Insufficient documentation

## 2013-11-02 DIAGNOSIS — Z880 Allergy status to penicillin: Secondary | ICD-10-CM | POA: Insufficient documentation

## 2013-11-02 DIAGNOSIS — F3289 Other specified depressive episodes: Secondary | ICD-10-CM | POA: Insufficient documentation

## 2013-11-02 DIAGNOSIS — Z7982 Long term (current) use of aspirin: Secondary | ICD-10-CM | POA: Insufficient documentation

## 2013-11-02 LAB — CBC WITH DIFFERENTIAL/PLATELET
BASOS ABS: 0 10*3/uL (ref 0.0–0.1)
BASOS PCT: 0 % (ref 0–1)
EOS ABS: 0.5 10*3/uL (ref 0.0–0.7)
Eosinophils Relative: 6 % — ABNORMAL HIGH (ref 0–5)
HCT: 44.2 % (ref 36.0–46.0)
Hemoglobin: 15.3 g/dL — ABNORMAL HIGH (ref 12.0–15.0)
Lymphocytes Relative: 31 % (ref 12–46)
Lymphs Abs: 2.4 10*3/uL (ref 0.7–4.0)
MCH: 31 pg (ref 26.0–34.0)
MCHC: 34.6 g/dL (ref 30.0–36.0)
MCV: 89.5 fL (ref 78.0–100.0)
Monocytes Absolute: 0.4 10*3/uL (ref 0.1–1.0)
Monocytes Relative: 5 % (ref 3–12)
NEUTROS PCT: 57 % (ref 43–77)
Neutro Abs: 4.3 10*3/uL (ref 1.7–7.7)
Platelets: 261 10*3/uL (ref 150–400)
RBC: 4.94 MIL/uL (ref 3.87–5.11)
RDW: 13.4 % (ref 11.5–15.5)
WBC: 7.5 10*3/uL (ref 4.0–10.5)

## 2013-11-02 LAB — COMPREHENSIVE METABOLIC PANEL
ALBUMIN: 4.3 g/dL (ref 3.5–5.2)
ALK PHOS: 73 U/L (ref 39–117)
ALT: 11 U/L (ref 0–35)
AST: 23 U/L (ref 0–37)
BILIRUBIN TOTAL: 0.6 mg/dL (ref 0.3–1.2)
BUN: 21 mg/dL (ref 6–23)
CO2: 25 mEq/L (ref 19–32)
Calcium: 10.6 mg/dL — ABNORMAL HIGH (ref 8.4–10.5)
Chloride: 98 mEq/L (ref 96–112)
Creatinine, Ser: 1.14 mg/dL — ABNORMAL HIGH (ref 0.50–1.10)
GFR, EST AFRICAN AMERICAN: 62 mL/min — AB (ref >90)
GFR, EST NON AFRICAN AMERICAN: 53 mL/min — AB (ref >90)
Glucose, Bld: 93 mg/dL (ref 70–99)
POTASSIUM: 4.5 meq/L (ref 3.7–5.3)
SODIUM: 137 meq/L (ref 137–147)
TOTAL PROTEIN: 8.3 g/dL (ref 6.0–8.3)

## 2013-11-02 LAB — URINE MICROSCOPIC-ADD ON

## 2013-11-02 LAB — URINALYSIS, ROUTINE W REFLEX MICROSCOPIC
Bilirubin Urine: NEGATIVE
Glucose, UA: NEGATIVE mg/dL
Ketones, ur: NEGATIVE mg/dL
NITRITE: NEGATIVE
PROTEIN: NEGATIVE mg/dL
SPECIFIC GRAVITY, URINE: 1.021 (ref 1.005–1.030)
UROBILINOGEN UA: 1 mg/dL (ref 0.0–1.0)
pH: 6 (ref 5.0–8.0)

## 2013-11-02 MED ORDER — SODIUM CHLORIDE 0.9 % IV BOLUS (SEPSIS)
1000.0000 mL | INTRAVENOUS | Status: AC
  Administered 2013-11-02: 1000 mL via INTRAVENOUS

## 2013-11-02 MED ORDER — METOCLOPRAMIDE HCL 5 MG/ML IJ SOLN
5.0000 mg | Freq: Once | INTRAMUSCULAR | Status: AC
  Administered 2013-11-02: 5 mg via INTRAVENOUS
  Filled 2013-11-02: qty 2

## 2013-11-02 MED ORDER — HALOPERIDOL LACTATE 5 MG/ML IJ SOLN
2.0000 mg | Freq: Once | INTRAMUSCULAR | Status: AC
  Administered 2013-11-02: 2 mg via INTRAVENOUS
  Filled 2013-11-02: qty 1

## 2013-11-02 MED ORDER — DIPHENHYDRAMINE HCL 50 MG/ML IJ SOLN
25.0000 mg | Freq: Once | INTRAMUSCULAR | Status: AC
  Administered 2013-11-02: 25 mg via INTRAVENOUS
  Filled 2013-11-02: qty 1

## 2013-11-02 NOTE — ED Notes (Signed)
To ED via [facility]-- medic 43 == from home, with c/o dizziness started when getting ready for church. On arrival A/O x 3, skin w/d-- speech clear, no weakness noted

## 2013-11-02 NOTE — ED Provider Notes (Signed)
CSN: [id number]     Arrival date & time 11/02/13  [id number] History   First MD Initiated Contact with Patient 11/02/13 [phone]     Chief Complaint  Patient presents with  . Dizziness     (Consider location/radiation/quality/duration/timing/severity/associated sxs/prior Treatment) Patient is a 58 y.o. female presenting with dizziness. The history is provided by the patient.  Dizziness Quality:  Lightheadedness, imbalance and room spinning Severity:  Mild Onset quality:  Gradual Duration:  2 hours Timing:  Constant Progression:  Unchanged Chronicity:  Recurrent Context: standing up   Relieved by:  Nothing Worsened by:  Nothing tried Ineffective treatments:  None tried Associated symptoms: headaches   Associated symptoms: no chest pain, no diarrhea, no nausea, no shortness of breath and no vomiting   Headaches:    Severity:  Moderate   Onset quality:  Gradual   Duration:  2 hours   Timing:  Constant   Progression:  Unchanged   Chronicity:  Chronic   Past Medical History  Diagnosis Date  . Reflux   . 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 Blindness of left eye 1978  . Hyperlipemia   . Asthma   . Migraine   . Sinus headache   . Cushing's syndrome   . Blood transfusion   . Anemia   . Constipation   . Hemorrhoids   . Depression   . 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 Stomach ulcer     "from the hydrocortisone"  . Adrenal hypofunction   . Thyroid disease     pt unsure if hyper or hypo???   Past Surgical History  Procedure Laterality Date  . Adrenal gland surgery  1982    on hydrocortisone  . Atrial myxoma excision  1978  . Vaginal hysterectomy  2000    "partial; w/left ovary"  . Breast biopsy  07/1999    ductal tissue excision & bx right; bx only left breast  . Rotator cuff repair  05/2005    left  . Eye surgery    . Repaired cross eyed      "as a child"   History reviewed. No pertinent family history. History   Substance Use Topics  . Smoking status: Never Smoker   . Smokeless tobacco: Never Used  . Alcohol Use: No   OB History   Grav Para Term Preterm Abortions TAB SAB Ect Mult Living                 Review of Systems  Constitutional: Negative for fever and fatigue.  HENT: Negative for congestion and drooling.   Eyes: Negative for pain.  Respiratory: Negative for cough and shortness of breath.   Cardiovascular: Negative for chest pain.  Gastrointestinal: Negative for nausea, vomiting, abdominal pain and diarrhea.  Genitourinary: Negative for dysuria and hematuria.  Musculoskeletal: Negative for back pain, gait problem and neck pain.  Skin: Negative for color change.  Neurological: Positive for dizziness and headaches.  Hematological: Negative for adenopathy.  Psychiatric/Behavioral: Negative for behavioral problems.  All other systems reviewed and are negative.      Allergies  Aspirin; Ibuprofen; and Penicillins  Home Medications   Current Outpatient Rx  Name  Route  Sig  Dispense  Refill  . ALPRAZolam (XANAX) 0.5 MG tablet   Oral   Take 0.5 mg by mouth at bedtime.         Marland Kitchen aspirin 81 MG  chewable tablet   Oral   Chew 81 mg by mouth daily.         . hydrocortisone (CORTEF) 20 MG tablet   Oral   Take 10-20 mg by mouth 2 (two) times daily. Take 1 tablet every morning and 1/2 tablet every evening         . levothyroxine (SYNTHROID, LEVOTHROID) 75 MCG tablet   Oral   Take 75 mcg by mouth daily.         . metoprolol tartrate (LOPRESSOR) 25 MG tablet   Oral   Take 12.5 mg by mouth 2 (two) times daily.         . Multiple Vitamin (MULTIVITAMIN WITH MINERALS) TABS   Oral   Take 1 tablet by mouth daily.         Marland Kitchen omeprazole (PRILOSEC) 20 MG capsule   Oral   Take 20 mg by mouth 2 (two) times daily.         . ondansetron (ZOFRAN) 4 MG tablet   Oral   Take 1 tablet (4 mg total) by mouth every 6 (six) hours.   12 tablet   0   . venlafaxine  (EFFEXOR-XR) 150 MG 24 hr capsule   Oral   Take 150 mg by mouth daily.            BP 106/73  Pulse 125  Temp(Src) 98.2 F (36.8 C) (Oral)  Resp 14  SpO2 97% Physical Exam  Nursing note and vitals reviewed. Constitutional: She is oriented to person, place, and time. She appears well-developed and well-nourished.  HENT:  Head: Normocephalic.  Mouth/Throat: Oropharynx is clear and moist. No oropharyngeal exudate.  Eyes: Conjunctivae and EOM are normal. Pupils are equal, round, and reactive to light.  Neck: Normal range of motion. Neck supple.  Cardiovascular: Normal rate, regular rhythm, normal heart sounds and intact distal pulses.  Exam reveals no gallop and no friction rub.   No murmur heard. Pulmonary/Chest: Effort normal and breath sounds normal. No respiratory distress. She has no wheezes.  Abdominal: Soft. Bowel sounds are normal. There is no tenderness. There is no rebound and no guarding.  Musculoskeletal: Normal range of motion. She exhibits no edema and no tenderness.  Neurological: She is alert and oriented to person, place, and time. She has normal strength. No cranial nerve deficit or sensory deficit. She displays a negative Romberg sign. Coordination and gait normal.  alert, oriented x3 speech: normal in context and clarity memory: intact grossly cranial nerves II-XII: intact motor strength: full proximally and distally no involuntary movements or tremors sensation: intact to light touch diffusely  cerebellar: finger-to-nose and heel-to-shin intact gait: normal forwards and backwards although pt had recurrence of dizziness w/ ambulation.   Skin: Skin is warm and dry.  Psychiatric: She has a normal mood and affect. Her behavior is normal.    ED Course  Procedures (including critical care time) Labs Review Labs Reviewed  CBC WITH DIFFERENTIAL - Abnormal; Notable for the following:    Hemoglobin 15.3 (*)    Eosinophils Relative 6 (*)    All other components  within normal limits  COMPREHENSIVE METABOLIC PANEL - Abnormal; Notable for the following:    Creatinine, Ser 1.14 (*)    Calcium 10.6 (*)    GFR calc non Af Amer 53 (*)    GFR calc Af Amer 62 (*)    All other components within normal limits  URINALYSIS, ROUTINE W REFLEX MICROSCOPIC - Abnormal; Notable for the following:  APPearance CLOUDY (*)    Hgb urine dipstick SMALL (*)    Leukocytes, UA LARGE (*)    All other components within normal limits  URINE MICROSCOPIC-ADD ON - Abnormal; Notable for the following:    Squamous Epithelial / LPF MANY (*)    All other components within normal limits   Imaging Review Ct Head Wo Contrast  11/02/2013   CLINICAL DATA:  Dizziness and headache  EXAM: CT HEAD WITHOUT CONTRAST  TECHNIQUE: Contiguous axial images were obtained from the base of the skull through the vertex without intravenous contrast.  COMPARISON:  CT 09/24/2011  FINDINGS: Ventricle size is normal. Negative for acute or chronic infarct. Negative for hemorrhage or mass.  Mucosal thickening throughout the paranasal sinuses. No acute bony changes.  IMPRESSION: Normal CT of the head  Chronic sinusitis.   Electronically Signed   By: Franchot Gallo M.D.   On: 11/02/2013 11:04    EKG Interpretation   None       Date: 11/02/2013  Rate: 95  Rhythm: normal sinus rhythm  QRS Axis: normal  Intervals: normal  ST/T Wave abnormalities: nonspecific T wave changes  Conduction Disutrbances:none  Narrative Interpretation: PVC, non-spec t wave changes in anterolateral leads  Old EKG Reviewed: changes noted   MDM   Final diagnoses:  Headache  Dizziness    10:34 AM 58 y.o. female w hx of CVA, blindness in left eye, cushing syndrome, adrenal hypofunction who presents with dizziness and headache. She notes that her symptoms began while she was getting dressed this morning slightly before 9 AM. She notes a gradual onset left-sided occipital headache consistent with previous migraines she has had  in the past. She rates as a 9/10. Concurrently with a headache she developed some dizziness. She notes that she only feels dizzy when up walking. She is afebrile and vital signs are unremarkable here. She states that she has been well otherwise and denies any fevers, cough, chest pain, shortness of breath, vomiting, or diarrhea. She feels dizzy when ambulating but is able to walk forwards and backwards without assistance. Will get screening labwork, CT of head, IV fluids, and orthostatic vital signs. The patient's heart rate was noted to increase to the 120s from the 90s with standing. She notes similar symptoms when she was admitted last year for adrenal hypofunction. However she was hypotensive at that time and is currently normotensive on my exam.  2:31 PM: HR has improved w/ IVF. HA now gone. Pt now denies dizziness. Ambulates forwards and backwards w/out difficulty. Dizziness likely related to HA.  I have discussed the diagnosis/risks/treatment options with the patient and believe the pt to be eligible for discharge home to follow-up with pcp as needed. We also discussed returning to the ED immediately if new or worsening sx occur. We discussed the sx which are most concerning (e.g., return of HA, fever, return of dizziness) that necessitate immediate return. Medications administered to the patient during their visit and any new prescriptions provided to the patient are listed below.  Medications given during this visit Medications  sodium chloride 0.9 % bolus 1,000 mL (0 mLs Intravenous Stopped 11/02/13 1309)  metoCLOPramide (REGLAN) injection 5 mg (5 mg Intravenous Given 11/02/13 1154)  diphenhydrAMINE (BENADRYL) injection 25 mg (25 mg Intravenous Given 11/02/13 1153)  sodium chloride 0.9 % bolus 1,000 mL (1,000 mLs Intravenous New Bag/Given 11/02/13 1321)  haloperidol lactate (HALDOL) injection 2 mg (2 mg Intravenous Given 11/02/13 1340)    New Prescriptions   No medications  on file     Blanchard Kelch, MD 11/02/13 [phone]

## 2014-06-27 ENCOUNTER — Emergency Department (HOSPITAL_COMMUNITY)
Admission: EM | Admit: 2014-06-27 | Discharge: 2014-06-28 | Disposition: A | Discharge status: Home / Self Care | Payer: Medicaid Other | Attending: Emergency Medicine | Admitting: Emergency Medicine

## 2014-06-27 ENCOUNTER — Emergency Department (HOSPITAL_COMMUNITY): Payer: Medicaid Other

## 2014-06-27 ENCOUNTER — Encounter (HOSPITAL_COMMUNITY): Payer: Self-pay | Admitting: Emergency Medicine

## 2014-06-27 DIAGNOSIS — W06XXXA Fall from bed, initial encounter: Secondary | ICD-10-CM | POA: Insufficient documentation

## 2014-06-27 DIAGNOSIS — S79911A Unspecified injury of right hip, initial encounter: Secondary | ICD-10-CM | POA: Insufficient documentation

## 2014-06-27 DIAGNOSIS — Y92009 Unspecified place in unspecified non-institutional (private) residence as the place of occurrence of the external cause: Secondary | ICD-10-CM | POA: Insufficient documentation

## 2014-06-27 DIAGNOSIS — Z79899 Other long term (current) drug therapy: Secondary | ICD-10-CM | POA: Diagnosis not present

## 2014-06-27 DIAGNOSIS — Z8673 Personal history of transient ischemic attack (TIA), and cerebral infarction without residual deficits: Secondary | ICD-10-CM | POA: Diagnosis not present

## 2014-06-27 DIAGNOSIS — Z7952 Long term (current) use of systemic steroids: Secondary | ICD-10-CM | POA: Insufficient documentation

## 2014-06-27 DIAGNOSIS — F329 Major depressive disorder, single episode, unspecified: Secondary | ICD-10-CM | POA: Insufficient documentation

## 2014-06-27 DIAGNOSIS — S3992XA Unspecified injury of lower back, initial encounter: Secondary | ICD-10-CM | POA: Diagnosis present

## 2014-06-27 DIAGNOSIS — K219 Gastro-esophageal reflux disease without esophagitis: Secondary | ICD-10-CM | POA: Diagnosis not present

## 2014-06-27 DIAGNOSIS — Z7982 Long term (current) use of aspirin: Secondary | ICD-10-CM | POA: Insufficient documentation

## 2014-06-27 DIAGNOSIS — M25551 Pain in right hip: Secondary | ICD-10-CM

## 2014-06-27 DIAGNOSIS — Z862 Personal history of diseases of the blood and blood-forming organs and certain disorders involving the immune mechanism: Secondary | ICD-10-CM | POA: Diagnosis not present

## 2014-06-27 DIAGNOSIS — E079 Disorder of thyroid, unspecified: Secondary | ICD-10-CM | POA: Insufficient documentation

## 2014-06-27 DIAGNOSIS — J45909 Unspecified asthma, uncomplicated: Secondary | ICD-10-CM | POA: Diagnosis not present

## 2014-06-27 DIAGNOSIS — W19XXXA Unspecified fall, initial encounter: Secondary | ICD-10-CM

## 2014-06-27 DIAGNOSIS — Z880 Allergy status to penicillin: Secondary | ICD-10-CM | POA: Insufficient documentation

## 2014-06-27 DIAGNOSIS — M1990 Unspecified osteoarthritis, unspecified site: Secondary | ICD-10-CM | POA: Diagnosis not present

## 2014-06-27 DIAGNOSIS — M7918 Myalgia, other site: Secondary | ICD-10-CM

## 2014-06-27 DIAGNOSIS — G43909 Migraine, unspecified, not intractable, without status migrainosus: Secondary | ICD-10-CM | POA: Diagnosis not present

## 2014-06-27 DIAGNOSIS — H5442 Blindness, left eye, normal vision right eye: Secondary | ICD-10-CM | POA: Diagnosis not present

## 2014-06-27 DIAGNOSIS — Y9389 Activity, other specified: Secondary | ICD-10-CM | POA: Diagnosis not present

## 2014-06-27 MED ORDER — HYDROCODONE-ACETAMINOPHEN 5-325 MG PO TABS
2.0000 | ORAL_TABLET | Freq: Once | ORAL | Status: AC
  Administered 2014-06-27: 2 via ORAL
  Filled 2014-06-27: qty 2

## 2014-06-27 NOTE — ED Provider Notes (Signed)
CSN: [id number]     Arrival date & time 06/27/14  1933 History   First MD Initiated Contact with Patient 06/27/14 1952     Chief Complaint  Patient presents with  . Back Pain     (Consider location/radiation/quality/duration/timing/severity/associated sxs/prior Treatment) HPI Comments: 58 year old female with history of depression, adrenal insufficiency, hyponatremia presents after mechanical fall.. Patient was sleeping and rolled out of bed landing on her back. Patient has tenderness in the lower part of her back. Patient denies head injury or hip pain.  Is not on blood thinners. No other significant injuries. Patient recalls details and she was awoken from the pain. No history of back surgeries.  Patient is a 58 y.o. female presenting with back pain. The history is provided by the patient.  Back Pain Associated symptoms: no abdominal pain, no chest pain, no dysuria, no fever, no headaches, no numbness and no weakness     Past Medical History  Diagnosis Date  . Reflux   . 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 Blindness of left eye 1978  . Hyperlipemia   . Asthma   . Migraine   . Sinus headache   . Cushing's syndrome   . Blood transfusion   . Anemia   . Constipation   . Hemorrhoids   . Depression   . 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 Stomach ulcer     "from the hydrocortisone"  . Adrenal hypofunction   . Thyroid disease     pt unsure if hyper or hypo???   Past Surgical History  Procedure Laterality Date  . Adrenal gland surgery  1982    on hydrocortisone  . Atrial myxoma excision  1978  . Vaginal hysterectomy  2000    "partial; w/left ovary"  . Breast biopsy  07/1999    ductal tissue excision & bx right; bx only left breast  . Rotator cuff repair  05/2005    left  . Eye surgery    . Repaired cross eyed      "as a child"   History reviewed. No pertinent family history. History  Substance Use Topics  . Smoking status:  Never Smoker   . Smokeless tobacco: Never Used  . Alcohol Use: No   OB History   Grav Para Term Preterm Abortions TAB SAB Ect Mult Living                 Review of Systems  Constitutional: Negative for fever and chills.  HENT: Negative for congestion.   Eyes: Negative for visual disturbance.  Respiratory: Negative for shortness of breath.   Cardiovascular: Negative for chest pain.  Gastrointestinal: Negative for vomiting and abdominal pain.  Genitourinary: Negative for dysuria and flank pain.  Musculoskeletal: Positive for back pain. Negative for neck pain and neck stiffness.  Skin: Negative for rash.  Neurological: Negative for weakness, light-headedness, numbness and headaches.      Allergies  Aspirin; Ibuprofen; and Penicillins  Home Medications   Prior to Admission medications   Medication Sig Start Date End Date Taking? Authorizing Provider  acetaminophen (TYLENOL) 500 MG tablet Take 500 mg by mouth every 6 (six) hours as needed for headache.   Yes Historical Provider, MD  ALPRAZolam Duanne Moron) 0.5 MG tablet Take 0.5 mg by mouth at bedtime.   Yes Historical Provider, MD  aspirin 81 MG chewable tablet Chew 81 mg by mouth daily.  Yes Historical Provider, MD  hydrocortisone (CORTEF) 20 MG tablet Take 10-20 mg by mouth 2 (two) times daily. Take 1 tablet every morning and 1/2 tablet every evening   Yes Historical Provider, MD  levothyroxine (SYNTHROID, LEVOTHROID) 75 MCG tablet Take 75 mcg by mouth daily.   Yes Historical Provider, MD  metoprolol tartrate (LOPRESSOR) 25 MG tablet Take 12.5 mg by mouth 2 (two) times daily.   Yes Historical Provider, MD  Multiple Vitamin (MULTIVITAMIN WITH MINERALS) TABS Take 1 tablet by mouth daily.   Yes Historical Provider, MD  omeprazole (PRILOSEC) 20 MG capsule Take 20 mg by mouth 2 (two) times daily.   Yes Historical Provider, MD  venlafaxine (EFFEXOR-XR) 150 MG 24 hr capsule Take 150 mg by mouth daily.     Yes Historical Provider, MD   BP  111/69  Pulse 80  Temp(Src) 98.7 F (37.1 C) (Oral)  Resp 13  SpO2 100% Physical Exam  Nursing note and vitals reviewed. Constitutional: She is oriented to person, place, and time. She appears well-developed and well-nourished.  HENT:  Head: Normocephalic and atraumatic.  Eyes: Conjunctivae are normal. Right eye exhibits no discharge. Left eye exhibits no discharge.  Neck: Normal range of motion. Neck supple. No tracheal deviation present.  Cardiovascular: Normal rate and regular rhythm.   Pulmonary/Chest: Effort normal and breath sounds normal.  Abdominal: Soft. She exhibits no distension. There is no tenderness. There is no guarding.  Musculoskeletal: She exhibits tenderness. She exhibits no edema.  Patient has mild midline lumbar or paraspinal tenderness, no thoracic or cervical midline tenderness, full range of motion head and neck. No tenderness to knees or ankles bilateral, no significant tenderness to hip bilateral with range of motion.  Neurological: She is alert and oriented to person, place, and time.  Skin: Skin is warm. No rash noted.  Psychiatric: She has a normal mood and affect.    ED Course  Procedures (including critical care time) Labs Review Labs Reviewed - No data to display  Imaging Review Dg Lumbar Spine Complete  06/27/2014   CLINICAL DATA:  58 year old female fell out of bed 4 days ago. Lower back pain. Initial encounter.  EXAM: LUMBAR SPINE - COMPLETE 4+ VIEW  COMPARISON:  06/02/2011 CT.  FINDINGS: The angulation of the coccyx is similar to prior CT.  Mild curvature of the lumbar spine convex to the right. No lumbar spine fracture is detected.  Minimal angulation right femoral neck region on oblique view may be related to positioning. If there are any hip pain, hip films recommended.  Multiple surgical clips throughout the abdomen.  IMPRESSION: The angulation of the coccyx is similar to prior CT.  Mild curvature of the lumbar spine convex to the right. No  lumbar spine fracture is detected.  Facet joint degenerative changes throughout the lumbar spine. Minimal anterior slip L4 unchanged.  Minimal angulation right femoral neck region on oblique view may be related to positioning. If there are any hip pain, hip films recommended.   Electronically Signed   By: Chauncey Cruel M.D.   On: 06/27/2014 21:28   Dg Hip Bilateral W/pelvis  06/27/2014   CLINICAL DATA:  Fall at home 4 days ago, fell from the bed. Bilateral hip pain, RIGHT greater than LEFT.  EXAM: BILATERAL HIP WITH PELVIS - 4+ VIEW  COMPARISON:  None.  FINDINGS: Femoral heads are well formed and located. Hip joint spaces are intact. Sacroiliac joints are symmetric.  No destructive bony lesions. Included soft tissue planes are non-suspicious.  IMPRESSION: No acute fracture deformity or dislocation.   Electronically Signed   By: Elon Alas   On: 06/27/2014 23:33     EKG Interpretation None      MDM   Final diagnoses:  Fall, initial encounter  Lumbar muscle pain  Right hip pain   Patient with mechanical fall and primary injury lower back, x-rays reviewed no acute fracture. X-ray noted mild angulation of right femoral neck, formal x-ray of the hip ordered for further delineation.  X-ray reviewed no acute fracture, patient improved significantly on recheck. Ambulated in the ED.   Results and differential diagnosis were discussed with the patient/parent/guardian. Close follow up outpatient was discussed, comfortable with the plan.   Medications  HYDROcodone-acetaminophen (NORCO/VICODIN) 5-325 MG per tablet 2 tablet (2 tablets Oral Given 06/27/14 2036)    Filed Vitals:   06/27/14 1947  BP: 111/69  Pulse: 80  Temp: 98.7 F (37.1 C)  TempSrc: Oral  Resp: 13  SpO2: 100%    Final diagnoses:  None       Mariea Clonts, MD 06/28/14 0030

## 2014-06-27 NOTE — ED Notes (Addendum)
No incontinence reported by pt s/p fall.  Pt sts she has pain when trying to have a bowel movement.  MAE x 4.  Pt sts she rolled out of bed when she was asleep and landed on her back.  Sts her lower back is what is her hurting. Pt somnolent during assessment due to last medication administration of Fentanyl.  Pain currently 10/10.

## 2014-06-27 NOTE — Discharge Instructions (Signed)
If you were given medicines take as directed.  If you are on coumadin or contraceptives realize their levels and effectiveness is altered by many different medicines.  If you have any reaction (rash, tongues swelling, other) to the medicines stop taking and see a physician.   Please follow up as directed and return to the ER or see a physician for new or worsening symptoms.  Thank you. Filed Vitals:   06/27/14 1947 06/27/14 2000  BP: 111/69 115/75  Pulse: 80 78  Temp: 98.7 F (37.1 C)   TempSrc: Oral   Resp: 13 18  SpO2: 100% 97%

## 2014-06-27 NOTE — ED Notes (Signed)
Patient transported to X-ray 

## 2014-06-27 NOTE — ED Notes (Signed)
MD Zavitz at bedside  

## 2014-06-27 NOTE — ED Notes (Signed)
Per EMS: pt from home with back pain s/p fall 4 days ago out of bed.  Pt reporting lower back tenderness.  Pt was MAE x 4 and had - LOC when fall occurred. Pt unable to get out of bed today so family called EMS.  Pt given a total 150 mcg of Fentanyl intranasally PTA.  Pain initially 10/10; on arrival 0/10.

## 2014-06-28 MED ORDER — HYDROCODONE-ACETAMINOPHEN 5-325 MG PO TABS: 1.0000 | ORAL_TABLET | ORAL | Status: DC | PRN

## 2014-08-20 ENCOUNTER — Encounter (HOSPITAL_COMMUNITY): Payer: Self-pay | Admitting: Cardiology

## 2014-09-07 ENCOUNTER — Emergency Department (HOSPITAL_COMMUNITY): Payer: Medicaid Other

## 2014-09-07 ENCOUNTER — Emergency Department (HOSPITAL_COMMUNITY)
Admission: EM | Admit: 2014-09-07 | Discharge: 2014-09-07 | Disposition: A | Discharge status: Home / Self Care | Payer: Medicaid Other | Attending: Emergency Medicine | Admitting: Emergency Medicine

## 2014-09-07 ENCOUNTER — Encounter (HOSPITAL_COMMUNITY): Payer: Self-pay

## 2014-09-07 DIAGNOSIS — K219 Gastro-esophageal reflux disease without esophagitis: Secondary | ICD-10-CM | POA: Insufficient documentation

## 2014-09-07 DIAGNOSIS — F329 Major depressive disorder, single episode, unspecified: Secondary | ICD-10-CM | POA: Insufficient documentation

## 2014-09-07 DIAGNOSIS — R05 Cough: Secondary | ICD-10-CM

## 2014-09-07 DIAGNOSIS — Z7982 Long term (current) use of aspirin: Secondary | ICD-10-CM | POA: Diagnosis not present

## 2014-09-07 DIAGNOSIS — G43909 Migraine, unspecified, not intractable, without status migrainosus: Secondary | ICD-10-CM | POA: Diagnosis present

## 2014-09-07 DIAGNOSIS — M47896 Other spondylosis, lumbar region: Secondary | ICD-10-CM | POA: Insufficient documentation

## 2014-09-07 DIAGNOSIS — B029 Zoster without complications: Secondary | ICD-10-CM | POA: Diagnosis not present

## 2014-09-07 DIAGNOSIS — Z7952 Long term (current) use of systemic steroids: Secondary | ICD-10-CM | POA: Insufficient documentation

## 2014-09-07 DIAGNOSIS — E079 Disorder of thyroid, unspecified: Secondary | ICD-10-CM | POA: Insufficient documentation

## 2014-09-07 DIAGNOSIS — J45901 Unspecified asthma with (acute) exacerbation: Secondary | ICD-10-CM | POA: Insufficient documentation

## 2014-09-07 DIAGNOSIS — H5442 Blindness, left eye, normal vision right eye: Secondary | ICD-10-CM | POA: Diagnosis not present

## 2014-09-07 DIAGNOSIS — Z8673 Personal history of transient ischemic attack (TIA), and cerebral infarction without residual deficits: Secondary | ICD-10-CM | POA: Insufficient documentation

## 2014-09-07 DIAGNOSIS — Z79899 Other long term (current) drug therapy: Secondary | ICD-10-CM | POA: Insufficient documentation

## 2014-09-07 DIAGNOSIS — Z9889 Other specified postprocedural states: Secondary | ICD-10-CM | POA: Insufficient documentation

## 2014-09-07 DIAGNOSIS — Z880 Allergy status to penicillin: Secondary | ICD-10-CM | POA: Insufficient documentation

## 2014-09-07 DIAGNOSIS — D649 Anemia, unspecified: Secondary | ICD-10-CM | POA: Diagnosis not present

## 2014-09-07 DIAGNOSIS — R059 Cough, unspecified: Secondary | ICD-10-CM

## 2014-09-07 MED ORDER — IPRATROPIUM-ALBUTEROL 0.5-2.5 (3) MG/3ML IN SOLN
3.0000 mL | Freq: Once | RESPIRATORY_TRACT | Status: AC
  Administered 2014-09-07: 3 mL via RESPIRATORY_TRACT
  Filled 2014-09-07: qty 3

## 2014-09-07 MED ORDER — VALACYCLOVIR HCL 500 MG PO TABS
1000.0000 mg | ORAL_TABLET | Freq: Once | ORAL | Status: AC
  Administered 2014-09-07: 1000 mg via ORAL
  Filled 2014-09-07 (×2): qty 2

## 2014-09-07 MED ORDER — VALACYCLOVIR HCL 1 G PO TABS: 1000.0000 mg | ORAL_TABLET | Freq: Three times a day (TID) | ORAL | Status: AC

## 2014-09-07 MED ORDER — MAGNESIUM SULFATE IN D5W 10-5 MG/ML-% IV SOLN
1.0000 g | Freq: Once | INTRAVENOUS | Status: AC
  Administered 2014-09-07: 1 g via INTRAVENOUS
  Filled 2014-09-07 (×2): qty 100

## 2014-09-07 MED ORDER — DEXAMETHASONE SODIUM PHOSPHATE 10 MG/ML IJ SOLN
10.0000 mg | Freq: Once | INTRAMUSCULAR | Status: AC
  Administered 2014-09-07: 10 mg via INTRAVENOUS
  Filled 2014-09-07: qty 1

## 2014-09-07 MED ORDER — PREDNISONE 20 MG PO TABS: 40.0000 mg | ORAL_TABLET | Freq: Every day | ORAL | Status: DC

## 2014-09-07 MED ORDER — HYDROCODONE-ACETAMINOPHEN 5-325 MG PO TABS: 1.0000 | ORAL_TABLET | ORAL | Status: DC | PRN

## 2014-09-07 MED ORDER — METOCLOPRAMIDE HCL 5 MG/ML IJ SOLN
10.0000 mg | Freq: Once | INTRAMUSCULAR | Status: AC
  Administered 2014-09-07: 10 mg via INTRAVENOUS
  Filled 2014-09-07: qty 2

## 2014-09-07 MED ORDER — SODIUM CHLORIDE 0.9 % IV BOLUS (SEPSIS)
1000.0000 mL | Freq: Once | INTRAVENOUS | Status: AC
  Administered 2014-09-07: 1000 mL via INTRAVENOUS

## 2014-09-07 MED ORDER — DIPHENHYDRAMINE HCL 50 MG/ML IJ SOLN
25.0000 mg | Freq: Once | INTRAMUSCULAR | Status: AC
  Administered 2014-09-07: 25 mg via INTRAVENOUS
  Filled 2014-09-07: qty 1

## 2014-09-07 NOTE — ED Provider Notes (Signed)
CSN: [id number]     Arrival date & time 09/07/14  1443 History   First MD Initiated Contact with Patient 09/07/14 1610     Chief Complaint  Patient presents with  . Rash  . Migraine     (Consider location/radiation/quality/duration/timing/severity/associated sxs/prior Treatment) The history is provided by the patient.     Pt presents with two complaints.    She c/o frontal and occipital migraine headache with sensitivity to light and sound x 4 days, feels like she was hit in the head.  Pain has been constant.  She is out of her home medications for migraine.    She also developed rash over her right upper back and right upper chest and neck.  It is described as burning, 10/10 pain.  No itching.  No change in personal care products.  The lesions have not been draining.  She has used no treatment on it.  Pt did have chicken pox as a child.  No fever, no facial lesions.    She has also had mild URI that she states is resolving but she continues to have cough.  Denies CP (with exception of area with skin rash), SOB.   Past Medical History  Diagnosis Date  . Reflux   . 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 Blindness of left eye 1978  . Hyperlipemia   . Asthma   . Migraine   . Sinus headache   . Cushing's syndrome   . Blood transfusion   . Anemia   . Constipation   . Hemorrhoids   . Depression   . 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 Stomach ulcer     "from the hydrocortisone"  . Adrenal hypofunction   . Thyroid disease     pt unsure if hyper or hypo???   Past Surgical History  Procedure Laterality Date  . Adrenal gland surgery  1982    on hydrocortisone  . Atrial myxoma excision  1978  . Vaginal hysterectomy  2000    "partial; w/left ovary"  . Breast biopsy  07/1999    ductal tissue excision & bx right; bx only left breast  . Rotator cuff repair  05/2005    left  . Eye surgery    . Repaired cross eyed      "as a child"  .  Left heart catheterization with coronary angiogram N/A 10/06/2011    Procedure: LEFT HEART CATHETERIZATION WITH CORONARY ANGIOGRAM;  Surgeon: Clent Demark, MD;  Location: [facility] CATH LAB;  Service: Cardiovascular;  Laterality: N/A;   History reviewed. No pertinent family history. History  Substance Use Topics  . Smoking status: Never Smoker   . Smokeless tobacco: Never Used  . Alcohol Use: No   OB History    No data available     Review of Systems  All other systems reviewed and are negative.     Allergies  Aspirin; Ibuprofen; and Penicillins  Home Medications   Prior to Admission medications   Medication Sig Start Date End Date Taking? Authorizing Provider  acetaminophen (TYLENOL) 500 MG tablet Take 500 mg by mouth every 6 (six) hours as needed for headache.    Historical Provider, MD  ALPRAZolam Duanne Moron) 0.5 MG tablet Take 0.5 mg by mouth at bedtime.    Historical Provider, MD  aspirin 81 MG chewable tablet Chew 81 mg by mouth daily.    Historical Provider, MD  HYDROcodone-acetaminophen (NORCO) 5-325 MG per tablet Take 1 tablet by mouth every 4 (four) hours as needed. 06/28/14   Mariea Clonts, MD  hydrocortisone (CORTEF) 20 MG tablet Take 10-20 mg by mouth 2 (two) times daily. Take 1 tablet every morning and 1/2 tablet every evening    Historical Provider, MD  levothyroxine (SYNTHROID, LEVOTHROID) 75 MCG tablet Take 75 mcg by mouth daily.    Historical Provider, MD  metoprolol tartrate (LOPRESSOR) 25 MG tablet Take 12.5 mg by mouth 2 (two) times daily.    Historical Provider, MD  Multiple Vitamin (MULTIVITAMIN WITH MINERALS) TABS Take 1 tablet by mouth daily.    Historical Provider, MD  omeprazole (PRILOSEC) 20 MG capsule Take 20 mg by mouth 2 (two) times daily.    Historical Provider, MD  venlafaxine (EFFEXOR-XR) 150 MG 24 hr capsule Take 150 mg by mouth daily.      Historical Provider, MD   BP 95/60 mmHg  Pulse 77  Temp(Src) 98.8 F (37.1 C) (Oral)  Resp 20  SpO2  100% Physical Exam  Constitutional: She appears well-developed and well-nourished. No distress.  HENT:  Head: Normocephalic and atraumatic.  Neck: Neck supple.  Cardiovascular: Normal rate and regular rhythm.   Pulmonary/Chest: Effort normal. No respiratory distress. She has no decreased breath sounds. She has wheezes. She has no rhonchi. She has no rales.  Right upper back and neck with right sided dermatomal vesicular erythematous rash that follows dermatomal pattern, involving right shoulder, terminating over right chest wall.  Sensitive throughout to light touch.  No facial involvement.  No erythematous patches, edema, warmth, discharge.  Abdominal: Soft. She exhibits no distension. There is no tenderness. There is no rebound and no guarding.  Neurological: She is alert. No cranial nerve deficit. She exhibits normal muscle tone. GCS eye subscore is 4. GCS verbal subscore is 5. GCS motor subscore is 6.  Moves all extremities equally.   Skin: She is not diaphoretic.  Nursing note and vitals reviewed.   ED Course  Procedures (including critical care time) Labs Review Labs Reviewed - No data to display  Imaging Review Dg Chest 2 View  09/07/2014   CLINICAL DATA:  Cough. Right-sided chest pain. Herpes zoster. Asthma.  EXAM: CHEST  2 VIEW  COMPARISON:  01/09/2013  FINDINGS: Heart size remains within normal limits. Old epicardial pacer leads remain in place. Patient has undergone previous median sternotomy. Both lungs are clear. No evidence of pneumothorax or pleural effusion. No mass or lymphadenopathy identified.  IMPRESSION: No active cardiopulmonary disease.   Electronically Signed   By: Earle Gell M.D.   On: 09/07/2014 17:10     EKG Interpretation None       4:29 PM Pt not yet in exam room.   6:44 PM Pt feeling much better.  Headache is 0/10 now, completely resolved.   MDM   Final diagnoses:  Cough  Migraine without status migrainosus, not intractable, unspecified migraine  type  Herpes zoster    Afebrile, nontoxic patient with typical migraine headache and one week of rash c/w herpes zoster. No e/o superinfection. Pt feeling better after IV medications.  No red flags for headache.  Also with resolving URI.  CXR negative.  D/C home with valacyclovir, prednisone, pain medication, PCP follow up.   Discussed result, findings, treatment, and follow up  with patient.  Pt given return precautions.  Pt verbalizes understanding and agrees with plan.         Clayton Bibles, PA-C 09/07/14 2035  Evelina Bucy, MD 09/08/14 [phone]

## 2014-09-07 NOTE — ED Notes (Signed)
Per pt, rash x 1 week and headache x 3 days.  Same location.

## 2014-09-07 NOTE — Discharge Instructions (Signed)
Read the information below.  Use the prescribed medication as directed.  Please discuss all new medications with your pharmacist.  Do not take additional tylenol while taking the prescribed pain medication to avoid overdose.  You may return to the Emergency Department at any time for worsening condition or any new symptoms that concern you.   If you develop fevers, uncontrolled pain, uncontrolled vomiting, or difficulty breathing or swallowing return to the ER for a recheck.   If you develop redness, swelling, pus draining from the wound, or fevers greater than 100.4, return to the ER immediately for a recheck.

## 2014-09-07 NOTE — ED Notes (Signed)
Per EMS, pt from home.  Pt c/o rash  X 3 days.  Migraine x 1 week.  Denies change in detergent/clothing, etc.  No new medications.  Vitals:  102 palp, 72 hr, resp 17, 98% ra.  HR irregular, hx a-fib

## 2014-09-09 ENCOUNTER — Emergency Department (HOSPITAL_COMMUNITY)
Admission: EM | Admit: 2014-09-09 | Discharge: 2014-09-09 | Disposition: A | Discharge status: Home / Self Care | Payer: Medicaid Other | Attending: Emergency Medicine | Admitting: Emergency Medicine

## 2014-09-09 DIAGNOSIS — M1990 Unspecified osteoarthritis, unspecified site: Secondary | ICD-10-CM | POA: Insufficient documentation

## 2014-09-09 DIAGNOSIS — B029 Zoster without complications: Secondary | ICD-10-CM | POA: Diagnosis not present

## 2014-09-09 DIAGNOSIS — Z8673 Personal history of transient ischemic attack (TIA), and cerebral infarction without residual deficits: Secondary | ICD-10-CM | POA: Insufficient documentation

## 2014-09-09 DIAGNOSIS — J45909 Unspecified asthma, uncomplicated: Secondary | ICD-10-CM | POA: Diagnosis not present

## 2014-09-09 DIAGNOSIS — K219 Gastro-esophageal reflux disease without esophagitis: Secondary | ICD-10-CM | POA: Insufficient documentation

## 2014-09-09 DIAGNOSIS — E079 Disorder of thyroid, unspecified: Secondary | ICD-10-CM | POA: Diagnosis not present

## 2014-09-09 DIAGNOSIS — Z880 Allergy status to penicillin: Secondary | ICD-10-CM | POA: Insufficient documentation

## 2014-09-09 DIAGNOSIS — F329 Major depressive disorder, single episode, unspecified: Secondary | ICD-10-CM | POA: Diagnosis not present

## 2014-09-09 DIAGNOSIS — G43909 Migraine, unspecified, not intractable, without status migrainosus: Secondary | ICD-10-CM | POA: Diagnosis not present

## 2014-09-09 DIAGNOSIS — D649 Anemia, unspecified: Secondary | ICD-10-CM | POA: Diagnosis not present

## 2014-09-09 DIAGNOSIS — Z79899 Other long term (current) drug therapy: Secondary | ICD-10-CM | POA: Diagnosis not present

## 2014-09-09 DIAGNOSIS — H5442 Blindness, left eye, normal vision right eye: Secondary | ICD-10-CM | POA: Insufficient documentation

## 2014-09-09 DIAGNOSIS — Z7982 Long term (current) use of aspirin: Secondary | ICD-10-CM | POA: Insufficient documentation

## 2014-09-09 DIAGNOSIS — M542 Cervicalgia: Secondary | ICD-10-CM | POA: Diagnosis present

## 2014-09-09 MED ORDER — GABAPENTIN 300 MG PO CAPS: 300.0000 mg | ORAL_CAPSULE | Freq: Two times a day (BID) | ORAL | Status: DC

## 2014-09-09 MED ORDER — GABAPENTIN 300 MG PO CAPS
300.0000 mg | ORAL_CAPSULE | ORAL | Status: AC
  Administered 2014-09-09: 300 mg via ORAL
  Filled 2014-09-09: qty 1

## 2014-09-09 NOTE — ED Notes (Addendum)
Patient states "I felt like fiberglass was in my covers. I had one bed bug on me and I killed it. I know my body better than you do. This isn't shingles, it's bed bugs".

## 2014-09-09 NOTE — ED Notes (Signed)
Bed: XU38 Expected date: 09/09/14 Expected time: 12:47 AM Means of arrival: Ambulance Comments: Shingles/swollen neck

## 2014-09-09 NOTE — Discharge Instructions (Signed)
Please follow the directions provided. Be sure to follow-up with your primary care doctor to ensure you're getting better. Start taking the gabapentin 300 mg twice a day. Continue to take the other medicines prescribed last night including the valacyclovir, and prednisone. Don't hesitate to return for any new, worsening, or concerning symptoms.   SEEK IMMEDIATE MEDICAL CARE IF:  You have facial pain, pain around the eye area, or loss of feeling on one side of your face.  You have ear pain or ringing in your ear.  You have loss of taste.  Your pain is not relieved with prescribed medicines.  Your redness or swelling spreads.  You have more pain and swelling.  Your condition is worsening or has changed.  You have a fever.

## 2014-09-09 NOTE — ED Provider Notes (Signed)
I personally performed the services described in this documentation, which was scribed in my presence. The recorded information has been reviewed and is accurate.  Carlisle Beers, MD 09/09/14 [phone]

## 2014-09-09 NOTE — ED Notes (Signed)
Patient presents from home via EMS for shingles and swollen neck. Patient seen yesterday and diagnosed with shingles. Patient reports seeing "bed bugs jump off" of her. Per EMS 45 minutes ago patient began c/o right neck, shoulder swelling.

## 2014-09-09 NOTE — ED Provider Notes (Signed)
CSN: [id number]     Arrival date & time 09/09/14  0108 History   First MD Initiated Contact with Patient 09/09/14 0154     Chief Complaint  Patient presents with  . Herpes Zoster  . Neck Pain   (Consider location/radiation/quality/duration/timing/severity/associated sxs/prior Treatment) HPI [name] is a 58 year old female presenting with continued burning and pain to her right arm. She was seen last night for the same symptoms of burning to her right arm with a rash. In some itching to her back. She reports she is confused as to the cause of her symptoms as she believes the cause is due to bedbugs however she was told last night that she has shingles. There've been no changes or symptoms, however the burning pain in her right arm is still bothersome. She denies any fevers, chills, nausea, vomiting, chest pain, shortness of breath or drainage from the right arm.   Past Medical History  Diagnosis Date  . Reflux   . 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 Blindness of left eye 1978  . Hyperlipemia   . Asthma   . Migraine   . Sinus headache   . Cushing's syndrome   . Blood transfusion   . Anemia   . Constipation   . Hemorrhoids   . Depression   . 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 Stomach ulcer     "from the hydrocortisone"  . Adrenal hypofunction   . Thyroid disease     pt unsure if hyper or hypo???   Past Surgical History  Procedure Laterality Date  . Adrenal gland surgery  1982    on hydrocortisone  . Atrial myxoma excision  1978  . Vaginal hysterectomy  2000    "partial; w/left ovary"  . Breast biopsy  07/1999    ductal tissue excision & bx right; bx only left breast  . Rotator cuff repair  05/2005    left  . Eye surgery    . Repaired cross eyed      "as a child"  . Left heart catheterization with coronary angiogram N/A 10/06/2011    Procedure: LEFT HEART CATHETERIZATION WITH CORONARY ANGIOGRAM;  Surgeon: Clent Demark, MD;  Location: [facility] CATH LAB;  Service: Cardiovascular;  Laterality: N/A;   No family history on file. History  Substance Use Topics  . Smoking status: Never Smoker   . Smokeless tobacco: Never Used  . Alcohol Use: No   OB History    No data available     Review of Systems  Constitutional: Negative for fever and chills.  HENT: Negative for sore throat.   Eyes: Negative for visual disturbance.  Respiratory: Negative for cough and shortness of breath.   Cardiovascular: Negative for chest pain and leg swelling.  Gastrointestinal: Negative for nausea, vomiting and diarrhea.  Genitourinary: Negative for dysuria.  Musculoskeletal: Negative for myalgias.  Skin: Positive for rash.  Neurological: Negative for weakness, numbness and headaches.      Allergies  Aspirin; Ibuprofen; and Penicillins  Home Medications   Prior to Admission medications   Medication Sig Start Date End Date Taking? Authorizing Provider  acetaminophen (TYLENOL) 500 MG tablet Take 500 mg by mouth every 6 (six) hours as needed for headache.   Yes Historical Provider, MD  ALPRAZolam Duanne Moron) 0.5 MG tablet Take 0.5 mg by mouth at bedtime as needed for sleep.    Yes Historical  Provider, MD  aspirin 81 MG chewable tablet Chew 81 mg by mouth daily.   Yes Historical Provider, MD  gabapentin (NEURONTIN) 300 MG capsule Take 300 mg by mouth daily.   Yes Historical Provider, MD  HYDROcodone-acetaminophen (NORCO/VICODIN) 5-325 MG per tablet Take 1-2 tablets by mouth every 4 (four) hours as needed for moderate pain or severe pain. 09/07/14  Yes Clayton Bibles, PA-C  hydrocortisone (CORTEF) 20 MG tablet Take 10-20 mg by mouth 2 (two) times daily. Take 1 tablet every morning and 1/2 tablet every evening   Yes Historical Provider, MD  levothyroxine (SYNTHROID, LEVOTHROID) 75 MCG tablet Take 75 mcg by mouth daily.   Yes Historical Provider, MD  metoprolol tartrate (LOPRESSOR) 25 MG tablet Take 12.5 mg by mouth 2 (two) times  daily.   Yes Historical Provider, MD  Multiple Vitamin (MULTIVITAMIN WITH MINERALS) TABS Take 1 tablet by mouth daily.   Yes Historical Provider, MD  omeprazole (PRILOSEC) 20 MG capsule Take 20 mg by mouth 2 (two) times daily.   Yes Historical Provider, MD  predniSONE (DELTASONE) 20 MG tablet Take 2 tablets (40 mg total) by mouth daily with breakfast. 09/07/14  Yes Clayton Bibles, PA-C  valACYclovir (VALTREX) 1000 MG tablet Take 1 tablet (1,000 mg total) by mouth 3 (three) times daily. 09/07/14 09/21/14 Yes Clayton Bibles, PA-C  venlafaxine (EFFEXOR-XR) 150 MG 24 hr capsule Take 150 mg by mouth daily.     Yes Historical Provider, MD   BP 121/80 mmHg  Pulse 78  Temp(Src) 97.9 F (36.6 C) (Oral)  Resp 16  SpO2 99% Physical Exam  Constitutional: She appears well-developed and well-nourished. No distress.  HENT:  Head: Normocephalic and atraumatic.  Mouth/Throat: Oropharynx is clear and moist. No oropharyngeal exudate.  Eyes: Conjunctivae are normal.  Neck: Neck supple. No thyromegaly present.  Cardiovascular: Normal rate, regular rhythm and intact distal pulses.   Pulmonary/Chest: Effort normal and breath sounds normal. No respiratory distress. She has no wheezes. She has no rales. She exhibits no tenderness.  Abdominal: Soft. There is no tenderness.  Musculoskeletal: She exhibits no tenderness.  Lymphadenopathy:    She has no cervical adenopathy.  Neurological: She is alert.  Skin: Skin is warm and dry. Rash noted. Rash is vesicular. She is not diaphoretic. There is erythema.     Psychiatric: She has a normal mood and affect.  Nursing note and vitals reviewed.   ED Course  Procedures (including critical care time) Labs Review Labs Reviewed - No data to display  Imaging Review Dg Chest 2 View  09/07/2014   CLINICAL DATA:  Cough. Right-sided chest pain. Herpes zoster. Asthma.  EXAM: CHEST  2 VIEW  COMPARISON:  01/09/2013  FINDINGS: Heart size remains within normal limits. Old epicardial  pacer leads remain in place. Patient has undergone previous median sternotomy. Both lungs are clear. No evidence of pneumothorax or pleural effusion. No mass or lymphadenopathy identified.  IMPRESSION: No active cardiopulmonary disease.   Electronically Signed   By: Earle Gell M.D.   On: 09/07/2014 17:10     EKG Interpretation None      MDM   Final diagnoses:  Herpes zoster   58 yo recently evaluated for shingles and bed bug bites.  Pt has no changes in symptoms but continued pain and confusion as to diagnosis.  Discussed diagnosis with pt and treatment regimen.  Will increase neurontin dose and instructed to continue valcyclovir.  Pt is well-appearing, in no acute distress and vital signs are stable.  They  appear safe to be discharged.  Discharge include follow-up with their PCP.  Return precautions provided. Pt aware of plan and in agreement.   Filed Vitals:   09/09/14 0114 09/09/14 0312  BP: 121/80 138/90  Pulse: 78 78  Temp: 97.9 F (36.6 C)   TempSrc: Oral   Resp: 16 16  SpO2: 99% 100%   Meds given in ED:  Medications  gabapentin (NEURONTIN) capsule 300 mg (300 mg Oral Given 09/09/14 0307)    Discharge Medication List as of 09/09/2014  2:56 AM         Britt Bottom, NP 09/09/14 1750  April K Palumbo-Rasch, MD 09/12/14 [phone]

## 2014-09-21 ENCOUNTER — Encounter: Payer: Self-pay | Admitting: Internal Medicine

## 2014-11-02 ENCOUNTER — Ambulatory Visit (INDEPENDENT_AMBULATORY_CARE_PROVIDER_SITE_OTHER): Payer: Medicaid Other | Admitting: Internal Medicine

## 2014-11-02 ENCOUNTER — Encounter: Payer: Self-pay | Admitting: Internal Medicine

## 2014-11-02 VITALS — BP 96/60 | HR 72 | Ht 59.5 in | Wt 170.5 lb

## 2014-11-02 DIAGNOSIS — Z800 Family history of malignant neoplasm of digestive organs: Secondary | ICD-10-CM

## 2014-11-02 DIAGNOSIS — Z1211 Encounter for screening for malignant neoplasm of colon: Secondary | ICD-10-CM

## 2014-11-02 MED ORDER — MOVIPREP 100 G PO SOLR: 1.0000 | Freq: Once | ORAL | Status: DC

## 2014-11-02 NOTE — Progress Notes (Signed)
HISTORY OF PRESENT ILLNESS:  [name] is a 59 y.o. female with multiple significant medical problems including multiple endocrine insufficiency who presents today regarding screening colonoscopy. She is accompanied by one of her sisters. Another sister was diagnosed with colon cancer at age 38 and subsequently passed away. Patient herself has not had screening colonoscopy. Her GI review of systems is entirely negative. Her multiple chronic medical problems are negative. She does report a remote history of peptic ulcer disease for which she saw Dr. Clarene Essex. Outside laboratories from Dr. Forde Dandy July 2015 reviewed. Previous reports from abdominal ultrasound 2014 and CT scan of the abdomen 2012 reviewed  REVIEW OF SYSTEMS:  All non-GI ROS negative except for back pain  Past Medical History  Diagnosis Date  . Reflux   . 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 Blindness of left eye 1978  . Hyperlipemia   . Asthma   . Migraine   . Sinus headache   . Cushing's syndrome   . Blood transfusion   . Anemia   . Constipation   . Hemorrhoids   . Depression   . 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 Stomach ulcer     "from the hydrocortisone"  . Adrenal hypofunction   . Thyroid disease     pt unsure if hyper or hypo???  . Atrial myxoma   . Hypothyroidism     per office note dated 03/25/2014  . Peptic ulcer associated with Helicobacter pylori infection   . Depression   . Breast cyst   . Amblyopia   . Glucocorticoid deficiency   . CVA (cerebral infarction)     Past Surgical History  Procedure Laterality Date  . Adrenal gland surgery  1982    on hydrocortisone  . Atrial myxoma excision  1978  . Vaginal hysterectomy  2000    "partial; w/left ovary"  . Breast biopsy  07/1999    ductal tissue excision & bx right; bx only left breast  . Rotator cuff repair  05/2005    left  . Eye surgery    . Repaired cross eyed      "as a child"  . Left  heart catheterization with coronary angiogram N/A 10/06/2011    Procedure: LEFT HEART CATHETERIZATION WITH CORONARY ANGIOGRAM;  Surgeon: Clent Demark, MD;  Location: [facility] CATH LAB;  Service: Cardiovascular;  Laterality: N/A;    Social History Jetty Duhamel Witthuhn  reports that she has never smoked. She has never used smokeless tobacco. She reports that she does not drink alcohol or use illicit drugs.  family history includes Acromegaly in her father; Colon cancer in her sister; Diabetes in her sister; Heart disease in her daughter, father, and sister; Kidney disease in her sister. There is no history of Colon polyps, Esophageal cancer, or Gallbladder disease.  Allergies  Allergen Reactions  . Aspirin Other (See Comments)    Can take baby aspirin (heart flutters with high doses)  . Ibuprofen [Ibuprofen] Nausea And Vomiting  . Penicillins Nausea And Vomiting       PHYSICAL EXAMINATION: Vital signs: BP 96/60 mmHg  Pulse 72  Ht 4' 11.5" (1.511 m)  Wt 170 lb 8 oz (77.338 kg)  BMI 33.87 kg/m2  Constitutional: generally well-appearing, no acute distress Psychiatric: alert and oriented x3, cooperative Eyes: extraocular movements intact, anicteric, conjunctiva pink. Left eye blindness Mouth: oral pharynx moist, no lesions Neck: supple  no lymphadenopathy Cardiovascular: heart regular rate and rhythm, no murmur Lungs: clear to auscultation bilaterally Abdomen: soft, nontender, nondistended, no obvious ascites, no peritoneal signs, normal bowel sounds, no organomegaly Rectal: Deferred until colonoscopy Extremities: no lower extremity edema bilaterally Skin: no lesions on visible extremities Neuro: No focal deficits.   ASSESSMENT:  #1. Family history colon cancer in sister at age 55 #2. Multiple medical problems. Stable   PLAN:  #1. Screening colonoscopy.The nature of the procedure, as well as the risks, benefits, and alternatives were carefully and thoroughly reviewed with the  patient. Ample time for discussion and questions allowed. The patient understood, was satisfied, and agreed to proceed. #2. Advised to take all regular medications re-procedure as she would normally

## 2014-11-02 NOTE — Patient Instructions (Signed)
You have been scheduled for a colonoscopy. Please follow written instructions given to you at your visit today.  Please pick up your prep kit at the pharmacy within the next 1-3 days. If you use inhalers (even only as needed), please bring them with you on the day of your procedure.  

## 2014-11-16 ENCOUNTER — Ambulatory Visit (AMBULATORY_SURGERY_CENTER): Payer: Medicaid Other | Admitting: Internal Medicine

## 2014-11-16 ENCOUNTER — Encounter: Payer: Self-pay | Admitting: Internal Medicine

## 2014-11-16 VITALS — BP 115/66 | HR 83 | Temp 98.0°F | Resp 23 | Ht 59.5 in | Wt 170.0 lb

## 2014-11-16 DIAGNOSIS — Z800 Family history of malignant neoplasm of digestive organs: Secondary | ICD-10-CM

## 2014-11-16 DIAGNOSIS — Z1211 Encounter for screening for malignant neoplasm of colon: Secondary | ICD-10-CM

## 2014-11-16 MED ORDER — SODIUM CHLORIDE 0.9 % IV SOLN: 500.0000 mL | INTRAVENOUS | Status: DC

## 2014-11-16 NOTE — Progress Notes (Signed)
Patient denies any allergies to eggs or soy. 

## 2014-11-16 NOTE — Patient Instructions (Signed)
YOU HAD AN ENDOSCOPIC PROCEDURE TODAY AT [facility]:   Refer to the procedure report that was given to you for any specific questions about what was found during the examination.  If the procedure report does not answer your questions, please call your gastroenterologist to clarify.  If you requested that your care partner not be given the details of your procedure findings, then the procedure report has been included in a sealed envelope for you to review at your convenience later.  YOU SHOULD EXPECT: Some feelings of bloating in the abdomen. Passage of more gas than usual.  Walking can help get rid of the air that was put into your GI tract during the procedure and reduce the bloating. If you had a lower endoscopy (such as a colonoscopy or flexible sigmoidoscopy) you may notice spotting of blood in your stool or on the toilet paper. If you underwent a bowel prep for your procedure, you may not have a normal bowel movement for a few days.  Please Note:  You might notice some irritation and congestion in your nose or some drainage.  This is from the oxygen used during your procedure.  There is no need for concern and it should clear up in a day or so.  SYMPTOMS TO REPORT IMMEDIATELY:   Following lower endoscopy (colonoscopy or flexible sigmoidoscopy):  Excessive amounts of blood in the stool  Significant tenderness or worsening of abdominal pains  Swelling of the abdomen that is new, acute  Fever of 100F or higher   Following upper endoscopy (EGD)  Vomiting of blood or coffee ground material  New chest pain or pain under the shoulder blades  Painful or persistently difficult swallowing  New shortness of breath  Fever of 100F or higher  Black, tarry-looking stools  For urgent or emergent issues, a gastroenterologist can be reached at any hour by calling [phone].   DIET: Your first meal following the procedure should be a small meal and then it is ok to progress to  your normal diet. Heavy or fried foods are harder to digest and may make you feel nauseous or bloated.  Likewise, meals heavy in dairy and vegetables can increase bloating.  Drink plenty of fluids but you should avoid alcoholic beverages for 24 hours. Increase the fiber in your diet.  ACTIVITY:  You should plan to take it easy for the rest of today and you should NOT DRIVE or use heavy machinery until tomorrow (because of the sedation medicines used during the test).    FOLLOW UP: Our staff will call the number listed on your records the next business day following your procedure to check on you and address any questions or concerns that you may have regarding the information given to you following your procedure. If we do not reach you, we will leave a message.  However, if you are feeling well and you are not experiencing any problems, there is no need to return our call.  We will assume that you have returned to your regular daily activities without incident.  If any biopsies were taken you will be contacted by phone or by letter within the next 1-3 weeks.  Please call us at [phone] if you have not heard about the biopsies in 3 weeks.    SIGNATURES/CONFIDENTIALITY: You and/or your care partner have signed paperwork which will be entered into your electronic medical record.  These signatures attest to the fact that that the information above on your  After Visit Summary has been reviewed and is understood.  Full responsibility of the confidentiality of this discharge information lies with you and/or your care-partner.  Read all of your papers given to you by your recovery room nurse.

## 2014-11-16 NOTE — Progress Notes (Signed)
A/ox3, pleased with MAC, report to RN 

## 2014-11-16 NOTE — Op Note (Signed)
[facility]  [address] [place], [id number]   COLONOSCOPY PROCEDURE REPORT  PATIENT: Mikayla, Stevenson  MR#: [id number] BIRTHDATE: 1956/02/07 , 33  yrs. old GENDER: female ENDOSCOPIST: Eustace Quail, MD REFERRED AP:OLIDCVU Forde Dandy, M.D. PROCEDURE DATE:  11/16/2014 PROCEDURE:   Colonoscopy, screening First Screening Colonoscopy - Avg.  risk and is 50 yrs.  old or older - No.  Prior Negative Screening - Now for repeat screening. N/A  History of Adenoma - Now for follow-up colonoscopy & has been > or = to 3 yrs.  N/A  Polyps Removed Today? No.  Recommend repeat exam, <10 yrs? Yes.  High risk (family or personal hx). ASA CLASS:   Class III INDICATIONS:Screening for colonic neoplasia and FH Colon or Rectal Adenocarcinoma. Sister @45  MEDICATIONS: Monitored anesthesia care and Propofol 200 mg IV  DESCRIPTION OF PROCEDURE:   After the risks benefits and alternatives of the procedure were thoroughly explained, informed consent was obtained.  The digital rectal exam revealed no abnormalities of the rectum.   The LB [id number]  endoscope was introduced through the anus and advanced to the cecum, which was identified by both the appendix and ileocecal valve. No adverse events experienced.   The quality of the prep was excellent.  , using MoviPrep  The instrument was then slowly withdrawn as the colon was fully examined.    COLON FINDINGS: There was moderate diverticulosis noted in the sigmoid colon.   The examination was otherwise normal.  Retroflexed views revealed internal hemorrhoids. The time to cecum = 3.8 Withdrawal time = 11.4   The scope was withdrawn and the procedure completed. COMPLICATIONS: There were no immediate complications.  ENDOSCOPIC IMPRESSION: 1.   Moderate diverticulosis was noted in the sigmoid colon 2.   The examination was otherwise normal  RECOMMENDATIONS: 1. Follow up colonoscopy in 5 years (Family hx)  eSigned:  Eustace Quail, MD 11/16/2014 1:44 PM   cc: Reynold Bowen, MD and The Patient

## 2014-11-16 NOTE — Progress Notes (Signed)
Patient may be discharged in 20 mins if stable per Dr. Henrene Pastor.    While giving discharge instructions, carepartner turned her chair with her back towards me.  Stated that she could just listen.

## 2014-11-17 ENCOUNTER — Telehealth: Payer: Self-pay | Admitting: *Deleted

## 2014-11-17 NOTE — Telephone Encounter (Signed)
  Follow up Call-  Call back number 11/16/2014  Post procedure Call Back phone  # [phone] son, ok talk to Harrell Gave  Permission to leave phone message Yes    [facility]

## 2015-07-18 ENCOUNTER — Encounter (HOSPITAL_COMMUNITY): Payer: Self-pay

## 2015-11-27 ENCOUNTER — Emergency Department (HOSPITAL_COMMUNITY)
Admission: EM | Admit: 2015-11-27 | Discharge: 2015-11-27 | Disposition: A | Discharge status: Home / Self Care | Payer: Medicaid Other | Attending: Emergency Medicine | Admitting: Emergency Medicine

## 2015-11-27 ENCOUNTER — Encounter (HOSPITAL_COMMUNITY): Payer: Self-pay

## 2015-11-27 DIAGNOSIS — R1031 Right lower quadrant pain: Secondary | ICD-10-CM | POA: Insufficient documentation

## 2015-11-27 DIAGNOSIS — R109 Unspecified abdominal pain: Secondary | ICD-10-CM

## 2015-11-27 DIAGNOSIS — J45909 Unspecified asthma, uncomplicated: Secondary | ICD-10-CM | POA: Insufficient documentation

## 2015-11-27 DIAGNOSIS — Z8673 Personal history of transient ischemic attack (TIA), and cerebral infarction without residual deficits: Secondary | ICD-10-CM | POA: Diagnosis not present

## 2015-11-27 DIAGNOSIS — H5442 Blindness, left eye, normal vision right eye: Secondary | ICD-10-CM | POA: Diagnosis not present

## 2015-11-27 NOTE — ED Notes (Signed)
Onset  1 week productive cough, yellow thick.  No respiratory distress, talking in complete sentences.  After pt had coughing spasm this morning she felt intermittant cramp iin RLQ of abd.

## 2015-11-27 NOTE — ED Notes (Signed)
The pt sitting with daughter  Who is a pt  Eating chips smiling  Decided to check in also for abd cramps

## 2015-11-27 NOTE — ED Notes (Signed)
No answer when called x 3.  

## 2016-01-26 ENCOUNTER — Encounter (HOSPITAL_COMMUNITY): Payer: Self-pay

## 2016-01-26 ENCOUNTER — Emergency Department (HOSPITAL_COMMUNITY): Payer: Medicaid Other

## 2016-01-26 ENCOUNTER — Emergency Department (HOSPITAL_COMMUNITY)
Admission: EM | Admit: 2016-01-26 | Discharge: 2016-01-27 | Disposition: A | Discharge status: Home / Self Care | Payer: Medicaid Other | Attending: Emergency Medicine | Admitting: Emergency Medicine

## 2016-01-26 DIAGNOSIS — F329 Major depressive disorder, single episode, unspecified: Secondary | ICD-10-CM | POA: Insufficient documentation

## 2016-01-26 DIAGNOSIS — G8929 Other chronic pain: Secondary | ICD-10-CM | POA: Diagnosis not present

## 2016-01-26 DIAGNOSIS — G43909 Migraine, unspecified, not intractable, without status migrainosus: Secondary | ICD-10-CM | POA: Diagnosis not present

## 2016-01-26 DIAGNOSIS — Y9289 Other specified places as the place of occurrence of the external cause: Secondary | ICD-10-CM | POA: Diagnosis not present

## 2016-01-26 DIAGNOSIS — Z8742 Personal history of other diseases of the female genital tract: Secondary | ICD-10-CM | POA: Insufficient documentation

## 2016-01-26 DIAGNOSIS — E039 Hypothyroidism, unspecified: Secondary | ICD-10-CM | POA: Insufficient documentation

## 2016-01-26 DIAGNOSIS — Z79899 Other long term (current) drug therapy: Secondary | ICD-10-CM | POA: Insufficient documentation

## 2016-01-26 DIAGNOSIS — Z7952 Long term (current) use of systemic steroids: Secondary | ICD-10-CM | POA: Diagnosis not present

## 2016-01-26 DIAGNOSIS — Z8711 Personal history of peptic ulcer disease: Secondary | ICD-10-CM | POA: Insufficient documentation

## 2016-01-26 DIAGNOSIS — S3992XA Unspecified injury of lower back, initial encounter: Secondary | ICD-10-CM | POA: Diagnosis present

## 2016-01-26 DIAGNOSIS — S39012A Strain of muscle, fascia and tendon of lower back, initial encounter: Secondary | ICD-10-CM | POA: Insufficient documentation

## 2016-01-26 DIAGNOSIS — S29001A Unspecified injury of muscle and tendon of front wall of thorax, initial encounter: Secondary | ICD-10-CM | POA: Diagnosis not present

## 2016-01-26 DIAGNOSIS — S199XXA Unspecified injury of neck, initial encounter: Secondary | ICD-10-CM | POA: Insufficient documentation

## 2016-01-26 DIAGNOSIS — X501XXA Overexertion from prolonged static or awkward postures, initial encounter: Secondary | ICD-10-CM | POA: Diagnosis not present

## 2016-01-26 DIAGNOSIS — Z8619 Personal history of other infectious and parasitic diseases: Secondary | ICD-10-CM | POA: Insufficient documentation

## 2016-01-26 DIAGNOSIS — Z8673 Personal history of transient ischemic attack (TIA), and cerebral infarction without residual deficits: Secondary | ICD-10-CM | POA: Diagnosis not present

## 2016-01-26 DIAGNOSIS — Y998 Other external cause status: Secondary | ICD-10-CM | POA: Diagnosis not present

## 2016-01-26 DIAGNOSIS — Z862 Personal history of diseases of the blood and blood-forming organs and certain disorders involving the immune mechanism: Secondary | ICD-10-CM | POA: Insufficient documentation

## 2016-01-26 DIAGNOSIS — K219 Gastro-esophageal reflux disease without esophagitis: Secondary | ICD-10-CM | POA: Insufficient documentation

## 2016-01-26 DIAGNOSIS — Z7982 Long term (current) use of aspirin: Secondary | ICD-10-CM | POA: Insufficient documentation

## 2016-01-26 DIAGNOSIS — Y93F2 Activity, caregiving, lifting: Secondary | ICD-10-CM | POA: Diagnosis not present

## 2016-01-26 DIAGNOSIS — Z880 Allergy status to penicillin: Secondary | ICD-10-CM | POA: Diagnosis not present

## 2016-01-26 DIAGNOSIS — J45909 Unspecified asthma, uncomplicated: Secondary | ICD-10-CM | POA: Insufficient documentation

## 2016-01-26 DIAGNOSIS — Z86018 Personal history of other benign neoplasm: Secondary | ICD-10-CM | POA: Insufficient documentation

## 2016-01-26 DIAGNOSIS — H5442 Blindness, left eye, normal vision right eye: Secondary | ICD-10-CM | POA: Diagnosis not present

## 2016-01-26 DIAGNOSIS — M479 Spondylosis, unspecified: Secondary | ICD-10-CM | POA: Insufficient documentation

## 2016-01-26 DIAGNOSIS — R0789 Other chest pain: Secondary | ICD-10-CM

## 2016-01-26 LAB — BASIC METABOLIC PANEL
Anion gap: 8 (ref 5–15)
BUN: 13 mg/dL (ref 6–20)
CHLORIDE: 104 mmol/L (ref 101–111)
CO2: 23 mmol/L (ref 22–32)
Calcium: 9.7 mg/dL (ref 8.9–10.3)
Creatinine, Ser: 1.15 mg/dL — ABNORMAL HIGH (ref 0.44–1.00)
GFR calc Af Amer: >60 mL/min (ref >60)
GFR calc non Af Amer: 52 mL/min — ABNORMAL LOW (ref >60)
Glucose, Bld: 100 mg/dL — ABNORMAL HIGH (ref 65–99)
POTASSIUM: 3.8 mmol/L (ref 3.5–5.1)
SODIUM: 135 mmol/L (ref 135–145)

## 2016-01-26 LAB — PROTIME-INR
INR: 1.03 (ref 0.00–1.49)
PROTHROMBIN TIME: 13.7 s (ref 11.6–15.2)

## 2016-01-26 LAB — CBC
HEMATOCRIT: 39.3 % (ref 36.0–46.0)
Hemoglobin: 13.1 g/dL (ref 12.0–15.0)
MCH: 30 pg (ref 26.0–34.0)
MCHC: 33.3 g/dL (ref 30.0–36.0)
MCV: 89.9 fL (ref 78.0–100.0)
PLATELETS: 368 10*3/uL (ref 150–400)
RBC: 4.37 MIL/uL (ref 3.87–5.11)
RDW: 14.2 % (ref 11.5–15.5)
WBC: 7.6 10*3/uL (ref 4.0–10.5)

## 2016-01-26 LAB — I-STAT TROPONIN, ED: TROPONIN I, POC: 0 ng/mL (ref 0.00–0.08)

## 2016-01-26 NOTE — ED Notes (Signed)
Pt reports she injured her lower back tonight after "lifting a 300-lb woman." Pt also revealed she has been having sharp chest pain this morning and states "im having it now a little bit."

## 2016-01-27 MED ORDER — HYDROCODONE-ACETAMINOPHEN 5-325 MG PO TABS
1.0000 | ORAL_TABLET | Freq: Once | ORAL | Status: AC
  Administered 2016-01-27: 1 via ORAL
  Filled 2016-01-27: qty 1

## 2016-01-27 MED ORDER — CYCLOBENZAPRINE HCL 10 MG PO TABS
10.0000 mg | ORAL_TABLET | Freq: Once | ORAL | Status: AC
  Administered 2016-01-27: 10 mg via ORAL
  Filled 2016-01-27: qty 1

## 2016-01-27 MED ORDER — LEVOTHYROXINE SODIUM 75 MCG PO TABS: 75.0000 ug | ORAL_TABLET | Freq: Every day | ORAL | Status: DC

## 2016-01-27 MED ORDER — OMEPRAZOLE 20 MG PO CPDR: 20.0000 mg | DELAYED_RELEASE_CAPSULE | Freq: Two times a day (BID) | ORAL | Status: DC

## 2016-01-27 MED ORDER — METOPROLOL TARTRATE 25 MG PO TABS: 12.5000 mg | ORAL_TABLET | Freq: Two times a day (BID) | ORAL | Status: DC

## 2016-01-27 MED ORDER — ORPHENADRINE CITRATE ER 100 MG PO TB12: 100.0000 mg | ORAL_TABLET | Freq: Two times a day (BID) | ORAL | Status: DC

## 2016-01-27 MED ORDER — HYDROCODONE-ACETAMINOPHEN 5-325 MG PO TABS: 1.0000 | ORAL_TABLET | ORAL | Status: DC | PRN

## 2016-01-27 MED ORDER — HYDROCORTISONE 20 MG PO TABS: 10.0000 mg | ORAL_TABLET | Freq: Two times a day (BID) | ORAL | Status: DC

## 2016-01-27 MED ORDER — GABAPENTIN 300 MG PO CAPS: 300.0000 mg | ORAL_CAPSULE | Freq: Two times a day (BID) | ORAL | Status: DC

## 2016-01-27 MED ORDER — VENLAFAXINE HCL ER 150 MG PO CP24: 150.0000 mg | ORAL_CAPSULE | Freq: Every day | ORAL | Status: DC

## 2016-01-27 NOTE — ED Provider Notes (Signed)
CSN: [id number]     Arrival date & time 01/26/16  2239 History  By signing my name below, I, Altamease Oiler, attest that this documentation has been prepared under the direction and in the presence of Delora Fuel, MD. Electronically Signed: Altamease Oiler, ED Scribe. 01/27/2016. 12:36 AM   Chief Complaint  Patient presents with  . Back Pain  . Chest Pain    The history is provided by the patient. No language interpreter was used.   [name] is a 60 y.o. female with PMHx of hypothyroidism, HLD, asthma, stroke, and chronic back pain who presents to the Emergency Department complaining of constant, 10/10 in severity, lower back pain with onset yesterday while trying to help someone transfer from their bed to a wheelchair. She heard a "crack" at the onset of pain. Movement exacerbates the pain. The pain does not radiate to the legs. Associated symptoms include neck pain. She also complains of intermittent sharp left-sided chest pain. She states that the episodes of pain occur without known trigger and last for approximately 2 seconds each. Certain movements and deep breathing exacerbate the chest pain. Pt denies weakness, numbness, tingling, and new bowel or bladder symptoms.She was seeing Dr. Forde Dandy but isn't anymore and is out of Lopressor, gabapentin, levothyroxine, Prilosec, and Effexor.  Past Medical History  Diagnosis Date  . Reflux   . Stroke [facility]) 1978    "piece tumor broke off & went to left eye; leaving me blind"  . Blindness of left eye 1978  . Hyperlipemia   . Asthma   . Migraine   . Sinus headache   . 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 Blood transfusion   . Anemia   . Constipation   . Hemorrhoids   . Depression   . 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 Stomach ulcer     "from the hydrocortisone"  . 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 Thyroid disease     pt unsure if hyper or hypo???  . Atrial myxoma   . Hypothyroidism     per office note dated 03/25/2014  . Peptic  ulcer associated with Helicobacter pylori infection   . Depression   . Breast cyst   . Amblyopia   . 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 CVA (cerebral infarction)    Past Surgical History  Procedure Laterality Date  . Adrenal gland surgery  1982    on hydrocortisone  . Atrial myxoma excision  1978  . Vaginal hysterectomy  2000    "partial; w/left ovary"  . Breast biopsy  07/1999    ductal tissue excision & bx right; bx only left breast  . Rotator cuff repair  05/2005    left  . Eye surgery    . Repaired cross eyed      "as a child"  . Left heart catheterization with coronary angiogram N/A 10/06/2011    Procedure: LEFT HEART CATHETERIZATION WITH CORONARY ANGIOGRAM;  Surgeon: Clent Demark, MD;  Location: [facility] CATH LAB;  Service: Cardiovascular;  Laterality: N/A;   Family History  Problem Relation Age of Onset  . Acromegaly Father   . Heart disease Father     Tumor in the heart  . Stomach cancer Father   . Colon cancer Sister 56  . Colon polyps Neg Hx   . Esophageal cancer Neg Hx   . Gallbladder disease Neg Hx   . Diabetes Sister   . Kidney disease Sister  x2  . Heart disease Sister     Tumor in the heart  . Heart disease Daughter     Tumor in the heart   Social History  Substance Use Topics  . Smoking status: Never Smoker   . Smokeless tobacco: Never Used  . Alcohol Use: No   OB History    No data available     Review of Systems  Cardiovascular: Positive for chest pain.  Genitourinary: Negative for difficulty urinating.  Musculoskeletal: Positive for back pain and neck pain.  Neurological: Positive for weakness and numbness.  All other systems reviewed and are negative.  Allergies  Aspirin; Ibuprofen; and Penicillins  Home Medications   Prior to Admission medications   Medication Sig Start Date End Date Taking? Authorizing Provider  acetaminophen (TYLENOL) 500 MG tablet Take 500 mg by mouth every 6 (six) hours as needed for headache.    Historical  Provider, MD  ALPRAZolam Duanne Moron) 0.5 MG tablet Take 0.5 mg by mouth at bedtime as needed for sleep.     Historical Provider, MD  aspirin 81 MG chewable tablet Chew 81 mg by mouth daily.    Historical Provider, MD  gabapentin (NEURONTIN) 300 MG capsule Take 1 capsule (300 mg total) by mouth 2 (two) times daily. 09/09/14   Britt Bottom, NP  HYDROcodone-acetaminophen (NORCO/VICODIN) 5-325 MG per tablet Take 1-2 tablets by mouth every 4 (four) hours as needed for moderate pain or severe pain. 09/07/14   Clayton Bibles, PA-C  hydrocortisone (CORTEF) 20 MG tablet Take 10-20 mg by mouth 2 (two) times daily. Take 1 tablet every morning and 1/2 tablet every evening    Historical Provider, MD  levothyroxine (SYNTHROID, LEVOTHROID) 75 MCG tablet Take 75 mcg by mouth daily.    Historical Provider, MD  metoprolol tartrate (LOPRESSOR) 25 MG tablet Take 12.5 mg by mouth 2 (two) times daily.    Historical Provider, MD  Multiple Vitamin (MULTIVITAMIN WITH MINERALS) TABS Take 1 tablet by mouth daily.    Historical Provider, MD  omeprazole (PRILOSEC) 20 MG capsule Take 20 mg by mouth 2 (two) times daily.    Historical Provider, MD  predniSONE (DELTASONE) 20 MG tablet Take 2 tablets (40 mg total) by mouth daily with breakfast. 09/07/14   Clayton Bibles, PA-C  venlafaxine (EFFEXOR-XR) 150 MG 24 hr capsule Take 150 mg by mouth daily.      Historical Provider, MD   BP 121/81 mmHg  Pulse 86  Temp(Src) 98.4 F (36.9 C) (Oral)  Resp 16  Ht 5' (1.524 m)  SpO2 98% Physical Exam  Constitutional: She is oriented to person, place, and time. She appears well-developed and well-nourished. No distress.  HENT:  Head: Normocephalic and atraumatic.  Eyes: EOM are normal. Pupils are equal, round, and reactive to light.  Neck: Normal range of motion. Neck supple. No JVD present.  Cardiovascular: Normal rate, regular rhythm and normal heart sounds.   No murmur heard. Pulmonary/Chest: Effort normal and breath sounds normal. She  has no wheezes. She has no rales. She exhibits tenderness.  Moderate tenderness at the left anterior chest wall   Abdominal: Soft. Bowel sounds are normal. She exhibits no distension and no mass. There is no tenderness.  Musculoskeletal: Normal range of motion. She exhibits no edema.  Tender over entire length of thoracic and lumbar spine with worse tenderness in the lumbar area. Moderate bilateral paralumbar spasm.  +SLR bilaterally at 15 degrees.   Lymphadenopathy:    She has no cervical adenopathy.  Neurological:  She is alert and oriented to person, place, and time. No cranial nerve deficit. She exhibits normal muscle tone. Coordination normal.  Skin: Skin is warm and dry. No rash noted.  Psychiatric: She has a normal mood and affect. Her behavior is normal. Judgment and thought content normal.  Nursing note and vitals reviewed.   ED Course  Procedures (including critical care time) DIAGNOSTIC STUDIES: Oxygen Saturation is 98% on RA,  normal by my interpretation.    COORDINATION OF CARE: 12:28 AM Discussed treatment plan which includes lab work, CXR, EKG with pt at bedside and pt agreed to plan.  Labs Review Results for orders placed or performed during the hospital encounter of [id number]  Basic metabolic panel  Result Value Ref Range   Sodium 135 135 - 145 mmol/L   Potassium 3.8 3.5 - 5.1 mmol/L   Chloride 104 101 - 111 mmol/L   CO2 23 22 - 32 mmol/L   Glucose, Bld 100 (H) 65 - 99 mg/dL   BUN 13 6 - 20 mg/dL   Creatinine, Ser 1.15 (H) 0.44 - 1.00 mg/dL   Calcium 9.7 8.9 - 10.3 mg/dL   GFR calc non Af Amer 52 (L) >60 mL/min   GFR calc Af Amer >60 >60 mL/min   Anion gap 8 5 - 15  CBC  Result Value Ref Range   WBC 7.6 4.0 - 10.5 K/uL   RBC 4.37 3.87 - 5.11 MIL/uL   Hemoglobin 13.1 12.0 - 15.0 g/dL   HCT 39.3 36.0 - 46.0 %   MCV 89.9 78.0 - 100.0 fL   MCH 30.0 26.0 - 34.0 pg   MCHC 33.3 30.0 - 36.0 g/dL   RDW 14.2 11.5 - 15.5 %   Platelets 368 150 - 400 K/uL   Protime-INR (order if Patient is taking Coumadin / Warfarin)  Result Value Ref Range   Prothrombin Time 13.7 11.6 - 15.2 seconds   INR 1.03 0.00 - 1.49  I-stat troponin, ED  Result Value Ref Range   Troponin i, poc 0.00 0.00 - 0.08 ng/mL   Comment 3            Imaging Review Dg Chest 2 View  01/26/2016  CLINICAL DATA:  LEFT side chest pain since this morning, history of atrial myxoma excision, asthma, hyperlipidemia, stroke EXAM: CHEST  2 VIEW COMPARISON:  09/07/2014 FINDINGS: Prior median sternotomy. Epicardial pacing wires present. Mild enlargement of cardiac silhouette. Mediastinal contours and pulmonary vascularity normal. Lungs clear. No pleural effusion or pneumothorax. Bones demineralized. Surgical clips in the upper abdomen bilaterally by history post adrenal surgery. IMPRESSION: Mild enlargement of cardiac silhouette. No acute abnormalities. Electronically Signed   By: Lavonia Dana M.D.   On: 01/26/2016 23:19   I have personally reviewed and evaluated these images and lab results as part of my medical decision-making.   EKG Interpretation   Date/Time:  Wednesday Jan 26 2016 22:48:12 EDT Ventricular Rate:  89 PR Interval:  154 QRS Duration: 84 QT Interval:  362 QTC Calculation: 440 R Axis:   64 Text Interpretation:  Sinus rhythm with frequent and consecutive Premature  ventricular complexes Nonspecific T wave abnormality Abnormal ECG When  compared with ECG of 03/08/2013, Premature ventricular complexes are now  Present Nonspecific T wave abnormality is now Present Inferior leads T  wave abnormality Anterior leads has improved Confirmed by Roxanne Mins  MD, Sonji Starkes  ([id number]) on 01/27/2016 12:09:35 AM      MDM   Final diagnoses:  Lumbar strain, initial encounter  Chest wall pain    Lumbar pain and chest pain which are clearly musculoskeletal related to exertion with trying to assist an obese patient. No red flags to suggest serious pathology, no indication for imaging. Screening  labs have been ordered at triage and are normal. Chest x-ray shows no acute process and ECG shows nonspecific T-wave changes.She is discharged with prescriptions for hydrocodone-acetaminophen and orphenadrine. She is also requesting her therapeutic medications be refilled since she has been kicked out of her prior medical practice and is looking for a new PCP. She is given prescriptions for gabapentin, hydrocortisone, levothyroxine, metoprolol tartrate, omeprazole, venlafaxine. She is given resources to try to find a new PCP.  I personally performed the services described in this documentation, which was scribed in my presence. The recorded information has been reviewed and is accurate.      Delora Fuel, MD [id number]

## 2016-01-27 NOTE — Discharge Instructions (Signed)
Chest Wall Pain Chest wall pain is pain in or around the bones and muscles of your chest. Sometimes, an injury causes this pain. Sometimes, the cause may not be known. This pain may take several weeks or longer to get better. HOME CARE INSTRUCTIONS  Pay attention to any changes in your symptoms. Take these actions to help with your pain:   Rest as told by your health care provider.   Avoid activities that cause pain. These include any activities that use your chest muscles or your abdominal and side muscles to lift heavy items.   If directed, apply ice to the painful area:  Put ice in a plastic bag.  Place a towel between your skin and the bag.  Leave the ice on for 20 minutes, 2-3 times per day.  Take over-the-counter and prescription medicines only as told by your health care provider.  Do not use tobacco products, including cigarettes, chewing tobacco, and e-cigarettes. If you need help quitting, ask your health care provider.  Keep all follow-up visits as told by your health care provider. This is important. SEEK MEDICAL CARE IF:  You have a fever.  Your chest pain becomes worse.  You have new symptoms. SEEK IMMEDIATE MEDICAL CARE IF:  You have nausea or vomiting.  You feel sweaty or light-headed.  You have a cough with phlegm (sputum) or you cough up blood.  You develop shortness of breath.   This information is not intended to replace advice given to you by your health care provider. Make sure you discuss any questions you have with your health care provider.   Document Released: 08/28/2005 Document Revised: 05/19/2015 Document Reviewed: 11/23/2014 Elsevier Interactive Patient Education 2016 Elsevier Inc.  Back Pain, Adult Back pain is very common in adults.The cause of back pain is rarely dangerous and the pain often gets better over time.The cause of your back pain may not be known. Some common causes of back pain include:  Strain of the muscles or  ligaments supporting the spine.  Wear and tear (degeneration) of the spinal disks.  Arthritis.  Direct injury to the back. For many people, back pain may return. Since back pain is rarely dangerous, most people can learn to manage this condition on their own. HOME CARE INSTRUCTIONS Watch your back pain for any changes. The following actions may help to lessen any discomfort you are feeling:  Remain active. It is stressful on your back to sit or stand in one place for long periods of time. Do not sit, drive, or stand in one place for more than 30 minutes at a time. Take short walks on even surfaces as soon as you are able.Try to increase the length of time you walk each day.  Exercise regularly as directed by your health care provider. Exercise helps your back heal faster. It also helps avoid future injury by keeping your muscles strong and flexible.  Do not stay in bed.Resting more than 1-2 days can delay your recovery.  Pay attention to your body when you bend and lift. The most comfortable positions are those that put less stress on your recovering back. Always use proper lifting techniques, including:  Bending your knees.  Keeping the load close to your body.  Avoiding twisting.  Find a comfortable position to sleep. Use a firm mattress and lie on your side with your knees slightly bent. If you lie on your back, put a pillow under your knees.  Avoid feeling anxious or stressed.Stress increases muscle tension  and can worsen back pain.It is important to recognize when you are anxious or stressed and learn ways to manage it, such as with exercise.  Take medicines only as directed by your health care provider. Over-the-counter medicines to reduce pain and inflammation are often the most helpful.Your health care provider may prescribe muscle relaxant drugs.These medicines help dull your pain so you can more quickly return to your normal activities and healthy exercise.  Apply ice  to the injured area:  Put ice in a plastic bag.  Place a towel between your skin and the bag.  Leave the ice on for 20 minutes, 2-3 times a day for the first 2-3 days. After that, ice and heat may be alternated to reduce pain and spasms.  Maintain a healthy weight. Excess weight puts extra stress on your back and makes it difficult to maintain good posture. SEEK MEDICAL CARE IF:  You have pain that is not relieved with rest or medicine.  You have increasing pain going down into the legs or buttocks.  You have pain that does not improve in one week.  You have night pain.  You lose weight.  You have a fever or chills. SEEK IMMEDIATE MEDICAL CARE IF:   You develop new bowel or bladder control problems.  You have unusual weakness or numbness in your arms or legs.  You develop nausea or vomiting.  You develop abdominal pain.  You feel faint.   This information is not intended to replace advice given to you by your health care provider. Make sure you discuss any questions you have with your health care provider.   Document Released: 08/28/2005 Document Revised: 09/18/2014 Document Reviewed: 12/30/2013 Elsevier Interactive Patient Education 2016 [place] [employer] 211 is a great source of information about community services available.  Access by dialing 2-1-1 from anywhere in [place], or by website -  CustodianSupply.fi.   Other Local Resources (Updated 09/2015)  [place]    Phone Number and Address  [place] medical care - 1st and 3rd Saturday of every month  Must not qualify for public or private insurance and must have limited income [phone] [address], [place]  Child care  Emergency assistance for housing and [employer]  Medicaid [phone] [address]  [place], [place] [address]   [facility] Department  Low-cost medical care for children, communicable diseases, sexually-transmitted diseases, immunizations, maternity care, womens health and family planning [phone] [address] [place], [place] [address]  [facility]   Medication assistance for [facility] residents  Must meet income requirements [phone] [place], [place].    [place]  Child care  Emergency assistance for housing and [employer]  Medicaid [phone] [place], [place] [address]  [facility] [place]   Low-cost medical care,   Monday through Friday, 9 am to 6 pm.   Accepts Medicare/Medicaid, and self-pay [phone] [address] [place], [place] [address]  [facility] for [place] care - Monday through Friday, 8:30 am - 5:30 pm  Accepts Medicaid and self-pay [phone] [address], [place], [place] [address]   [facility]  Primary medical care, including for those with sickle cell disease  Accepts  Medicare, Medicaid, insurance and self-pay [phone] [address] [place], [place]  [facility]   Primary medical care  Accepts Medicare, [place], insurance and self-pay [phone] [place], [place] [address]   [facility] Department of Social Services  Child care  Emergency assistance for housing and [employer]  Medicaid [phone] [address] [place], [place] [address]  [place] Department of Health and [employer]  Child care  Emergency assistance for housing and [employer]  Medicaid [phone] [place], [place] [address]   [facility] Medication Assistance Program   Medication assistance for [facility] residents with no insurance only  Must have a primary care doctor [phone] [address], [place], [place]  [facility]   Primary medical care  [place], [place], insurance  [phone] [place], [place]  [facility]   Medication assistance [phone]  [facility]   Primary medical care  Accepts Medicare, [place], insurance and self-pay [phone] [address] [place], [place] [address]  [facility]   Primary medical care  Accepts Medicare, [place], insurance and self-pay [phone] [address] [place], [place] [address]  [facility]  For [place] residents between the ages of 78 and 25 who do not have any form of health insurance, Medicare, [place], or [place] benefits.  Services are provided free of charge to uninsured patients who fall within federal poverty guidelines.    Hours: Tuesdays and Thursdays, 4:15 - 8 pm [phone] [address] [place], [place] [address]  [facility]     Primary medical care  Dental care  Nutritional counseling  Pharmacy  Accepts Medicaid, Medicare, most insurance.  Fees are adjusted based on ability to pay.   [place], [place] [place], [place], [place] [facility], [place], [place] [facility] [place], [place]  [facility]  Womens health and family planning [phone] [place]. [place], [place] care  Emergency assistance for housing and [place] [employer]  Medicaid [phone] [place], [place] [address]   [facility]    Ages 85 and older  Hours: Mondays and Thursdays, 7:00 am - 9:00 am Patients are seen on a first come, first served basis. [phone], ext. [place], [place]  Child care  Emergency assistance for housing and [employer]  Medicaid [phone] [address], [employer] [address]  [facility] [place]  Medication assistance  Rental assistance  Food pantry  Medication assistance  Housing assistance  Emergency food distribution  Utility assistance [place], [place]  [address] [place], [place] [address] Hours: Tuesdays and Thursdays from 9am - 12 noon by appointment only  [place], [place] [address]  [facility] private insurance, [place], and [place].  Payment is based on a sliding scale for those without insurance.  Hours: Mondays, Tuesdays and Thursdays, 8:30 am - 5:30 pm.   [phone]  [place], [place]  [facility] - Family Medicine at [facility], [place], and [place].  Payment is based on a sliding scale for those without insurance. [phone] [address] [place], [place]  [facility] - Pediatrics at [address], research (medical), [employer], and [place].  Payment is based on a sliding scale for those without insurance [phone] [address] [place], [employer], [place]  [facility] - Pediatrics at [employer], [place], and [place].  Payment is based on a sliding scale for those without insurance. [phone] [place], [place]  [facility] -  Pediatrics at [facility], [place], and [place].  Payment is based on a sliding scale for those without insurance. [phone], ext. [phone] [place], [place].    [place] care.  Accepts Medicaid and self-pay. [place], [place]

## 2016-04-16 ENCOUNTER — Emergency Department (HOSPITAL_COMMUNITY)
Admission: EM | Admit: 2016-04-16 | Discharge: 2016-04-16 | Disposition: A | Discharge status: Home / Self Care | Payer: Medicaid Other | Attending: Emergency Medicine | Admitting: Emergency Medicine

## 2016-04-16 ENCOUNTER — Emergency Department (HOSPITAL_COMMUNITY): Payer: Medicaid Other

## 2016-04-16 ENCOUNTER — Encounter (HOSPITAL_COMMUNITY): Payer: Self-pay | Admitting: *Deleted

## 2016-04-16 DIAGNOSIS — R0602 Shortness of breath: Secondary | ICD-10-CM | POA: Insufficient documentation

## 2016-04-16 DIAGNOSIS — J45909 Unspecified asthma, uncomplicated: Secondary | ICD-10-CM | POA: Insufficient documentation

## 2016-04-16 DIAGNOSIS — R531 Weakness: Secondary | ICD-10-CM

## 2016-04-16 DIAGNOSIS — Z8673 Personal history of transient ischemic attack (TIA), and cerebral infarction without residual deficits: Secondary | ICD-10-CM | POA: Diagnosis not present

## 2016-04-16 DIAGNOSIS — Z79899 Other long term (current) drug therapy: Secondary | ICD-10-CM | POA: Insufficient documentation

## 2016-04-16 DIAGNOSIS — E039 Hypothyroidism, unspecified: Secondary | ICD-10-CM | POA: Insufficient documentation

## 2016-04-16 DIAGNOSIS — R5383 Other fatigue: Secondary | ICD-10-CM | POA: Diagnosis not present

## 2016-04-16 DIAGNOSIS — Z7982 Long term (current) use of aspirin: Secondary | ICD-10-CM | POA: Insufficient documentation

## 2016-04-16 LAB — URINALYSIS, ROUTINE W REFLEX MICROSCOPIC
BILIRUBIN URINE: NEGATIVE
Glucose, UA: NEGATIVE mg/dL
Hgb urine dipstick: NEGATIVE
KETONES UR: NEGATIVE mg/dL
NITRITE: NEGATIVE
PH: 5.5 (ref 5.0–8.0)
Protein, ur: NEGATIVE mg/dL
SPECIFIC GRAVITY, URINE: 1.02 (ref 1.005–1.030)

## 2016-04-16 LAB — CBC
HEMATOCRIT: 49.3 % — AB (ref 36.0–46.0)
Hemoglobin: 16.4 g/dL — ABNORMAL HIGH (ref 12.0–15.0)
MCH: 30.1 pg (ref 26.0–34.0)
MCHC: 33.3 g/dL (ref 30.0–36.0)
MCV: 90.6 fL (ref 78.0–100.0)
Platelets: 309 10*3/uL (ref 150–400)
RBC: 5.44 MIL/uL — ABNORMAL HIGH (ref 3.87–5.11)
RDW: 13.3 % (ref 11.5–15.5)
WBC: 7.8 10*3/uL (ref 4.0–10.5)

## 2016-04-16 LAB — URINE MICROSCOPIC-ADD ON: BACTERIA UA: NONE SEEN

## 2016-04-16 LAB — BASIC METABOLIC PANEL
Anion gap: 10 (ref 5–15)
BUN: 28 mg/dL — AB (ref 6–20)
CO2: 21 mmol/L — ABNORMAL LOW (ref 22–32)
CREATININE: 1.54 mg/dL — AB (ref 0.44–1.00)
Calcium: 10.3 mg/dL (ref 8.9–10.3)
Chloride: 100 mmol/L — ABNORMAL LOW (ref 101–111)
GFR, EST AFRICAN AMERICAN: 42 mL/min — AB (ref >60)
GFR, EST NON AFRICAN AMERICAN: 36 mL/min — AB (ref >60)
Glucose, Bld: 113 mg/dL — ABNORMAL HIGH (ref 65–99)
Potassium: 5 mmol/L (ref 3.5–5.1)
SODIUM: 131 mmol/L — AB (ref 135–145)

## 2016-04-16 LAB — I-STAT TROPONIN, ED: TROPONIN I, POC: 0 ng/mL (ref 0.00–0.08)

## 2016-04-16 MED ORDER — SODIUM CHLORIDE 0.9 % IV BOLUS (SEPSIS)
1000.0000 mL | Freq: Once | INTRAVENOUS | Status: AC
  Administered 2016-04-16: 1000 mL via INTRAVENOUS

## 2016-04-16 NOTE — ED Provider Notes (Signed)
[facility] DEPT Provider Note   CSN: [id number] Arrival date & time: 04/16/16  1449  First Provider Contact:  First MD Initiated Contact with Patient 04/16/16 1643        History   Chief Complaint Chief Complaint  Patient presents with  . Fatigue  . Shortness of Breath    HPI [name] is a 60 y.o. female.  Patient is a 60 year old female with past medical history of hypothyroidism, hypotension, and adrenal insufficiency. She presents for evaluation of weakness and fatigue that has been worsening over the past several days. She denies any specific complaints such as chest pain or fever. She does feel somewhat short of breath with ambulation but denies cough. She denies any leg swelling or pain.   The history is provided by the patient.  Shortness of Breath  This is a new problem. The average episode lasts 3 days. The problem occurs continuously.Pertinent negatives include no fever, no cough, no sputum production, no chest pain, no leg pain and no leg swelling. She has tried nothing for the symptoms. Associated medical issues do not include asthma or COPD.    Past Medical History:  Diagnosis Date  . 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[place])   . Amblyopia   . Anemia   . Arthritis    "in my lower back"  . Asthma   . Atrial myxoma   . Blindness of left eye 1978  . Blood transfusion   . Breast cyst   . Constipation   . 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 CVA (cerebral infarction)   . Depression   . Depression   . 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 Hemorrhoids   . Hyperlipemia   . Hypothyroidism    per office note dated 03/25/2014  . Migraine   . Peptic ulcer associated with Helicobacter pylori infection   . Reflux   . Sinus headache   . Stomach ulcer    "from the hydrocortisone"  . Stroke [facility]) 1978   "piece tumor broke off & went to left eye; leaving me blind"  . Thyroid disease    pt unsure if hyper or hypo???    Patient Active Problem List   Diagnosis Date Noted    . Headache 11/02/2013  . Dizziness 11/02/2013  . Anemia 01/10/2013  . Chest pain, unspecified 01/10/2013  . Adjustment disorder with mixed anxiety and depressed mood 01/10/2013  . Adrenal crisis ([place]) 01/07/2013  . Nausea & vomiting 08/03/2011  . Hypotension 08/03/2011  . Adrenal insufficiency ([place]) 08/03/2011  . Hyponatremia 08/03/2011  . Hypothyroidism 08/03/2011  . Depression 08/03/2011    Past Surgical History:  Procedure Laterality Date  . ADRENAL GLAND SURGERY  1982   on hydrocortisone  . ATRIAL MYXOMA EXCISION  1978  . BREAST BIOPSY  07/1999   ductal tissue excision & bx right; bx only left breast  . EYE SURGERY    . LEFT HEART CATHETERIZATION WITH CORONARY ANGIOGRAM N/A 10/06/2011   Procedure: LEFT HEART CATHETERIZATION WITH CORONARY ANGIOGRAM;  Surgeon: Clent Demark, MD;  Location[facility] CATH LAB;  Service: Cardiovascular;  Laterality: N/A;  . repaired cross eyed     "as a child"  . ROTATOR CUFF REPAIR  05/2005   left  . VAGINAL HYSTERECTOMY  2000   "partial; w/left ovary"    OB History    No data available       Home Medications    Prior to Admission medications   Medication Sig Start Date End Date Taking? Authorizing  Provider  acetaminophen (TYLENOL) 500 MG tablet Take 500 mg by mouth every 6 (six) hours as needed for headache.    Historical Provider, MD  ALPRAZolam Duanne Moron) 0.5 MG tablet Take 0.5 mg by mouth at bedtime as needed for sleep.     Historical Provider, MD  aspirin 81 MG chewable tablet Chew 81 mg by mouth daily.    Historical Provider, MD  gabapentin (NEURONTIN) 300 MG capsule Take 1 capsule (300 mg total) by mouth 2 (two) times daily. [id number]   Delora Fuel, MD  HYDROcodone-acetaminophen (NORCO/VICODIN) 5-325 MG tablet Take 1-2 tablets by mouth every 4 (four) hours as needed for moderate pain or severe pain. [id number]   Delora Fuel, MD  hydrocortisone (CORTEF) 20 MG tablet Take 0.5-1 tablets (10-20 mg total) by mouth 2 (two) times daily. Take 1  tablet every morning and 1/2 tablet every evening [id number]   Delora Fuel, MD  levothyroxine (SYNTHROID, LEVOTHROID) 75 MCG tablet Take 1 tablet (75 mcg total) by mouth daily. [id number]   Delora Fuel, MD  metoprolol tartrate (LOPRESSOR) 25 MG tablet Take 0.5 tablets (12.5 mg total) by mouth 2 (two) times daily. [id number]   Delora Fuel, MD  Multiple Vitamin (MULTIVITAMIN WITH MINERALS) TABS Take 1 tablet by mouth daily.    Historical Provider, MD  omeprazole (PRILOSEC) 20 MG capsule Take 1 capsule (20 mg total) by mouth 2 (two) times daily. [id number]   Delora Fuel, MD  orphenadrine (NORFLEX) 100 MG tablet Take 1 tablet (100 mg total) by mouth 2 (two) times daily. [id number]   Delora Fuel, MD  predniSONE (DELTASONE) 20 MG tablet Take 2 tablets (40 mg total) by mouth daily with breakfast. 09/07/14   Clayton Bibles, PA-C  venlafaxine XR (EFFEXOR-XR) 150 MG 24 hr capsule Take 1 capsule (150 mg total) by mouth daily. [id number]   Delora Fuel, MD    Family History Family History  Problem Relation Age of Onset  . Colon cancer Sister 38  . Acromegaly Father   . Heart disease Father     Tumor in the heart  . Stomach cancer Father   . Diabetes Sister   . Kidney disease Sister     x2  . Heart disease Sister     Tumor in the heart  . Heart disease Daughter     Tumor in the heart  . Colon polyps Neg Hx   . Esophageal cancer Neg Hx   . Gallbladder disease Neg Hx     Social History Social History  Substance Use Topics  . Smoking status: Never Smoker  . Smokeless tobacco: Never Used  . Alcohol use No     Allergies   Aspirin; Ibuprofen [ibuprofen]; and Penicillins   Review of Systems Review of Systems  Constitutional: Negative for fever.  Respiratory: Positive for shortness of breath. Negative for cough and sputum production.   Cardiovascular: Negative for chest pain and leg swelling.  All other systems reviewed and are negative.    Physical Exam Updated Vital Signs BP 119/88   Pulse 112   Temp  98.6 F (37 C) (Oral)   Resp 21   SpO2 99%   Physical Exam  Constitutional: She is oriented to person, place, and time. She appears well-developed and well-nourished. No distress.  HENT:  Head: Normocephalic and atraumatic.  Neck: Normal range of motion. Neck supple.  Cardiovascular: Normal rate, regular rhythm and normal heart sounds.  Exam reveals no gallop and no friction rub.   No murmur heard. Pulmonary/Chest: Effort normal  and breath sounds normal. No respiratory distress. She has no wheezes.  Abdominal: Soft. Bowel sounds are normal. She exhibits no distension. There is no tenderness.  Musculoskeletal: Normal range of motion. She exhibits no edema or tenderness.  Neurological: She is alert and oriented to person, place, and time.  Skin: Skin is warm and dry. She is not diaphoretic.  Nursing note and vitals reviewed.    ED Treatments / Results  Labs (all labs ordered are listed, but only abnormal results are displayed) Labs Reviewed  CBC - Abnormal; Notable for the following:       Result Value   RBC 5.44 (*)    Hemoglobin 16.4 (*)    HCT 49.3 (*)    All other components within normal limits  BASIC METABOLIC PANEL  URINALYSIS, ROUTINE W REFLEX MICROSCOPIC (NOT AT [facility])  BRAIN NATRIURETIC PEPTIDE  I-STAT TROPOININ, ED    EKG  EKG Interpretation None       Radiology Dg Chest 2 View  Result Date: 04/16/2016 CLINICAL DATA:  Shortness of breath EXAM: CHEST  2 VIEW COMPARISON:  01/26/2016 FINDINGS: Abandoned pacing wires are again noted. Previous median sternotomy. The heart size and mediastinal contours are within normal limits. Both lungs are clear. The visualized skeletal structures are unremarkable. IMPRESSION: No active cardiopulmonary disease. Electronically Signed   By: Kerby Moors M.D.   On: 04/16/2016 15:55    Procedures Procedures (including critical care time)  Medications Ordered in ED Medications - No data to display   Initial Impression /  Assessment and Plan / ED Course  I have reviewed the triage vital signs and the nursing notes.  Pertinent labs & imaging results that were available during my care of the patient were reviewed by me and considered in my medical decision making (see chart for details).  Clinical Course      Final Clinical Impressions(s) / ED Diagnoses   Final diagnoses:  None   Patient presents here with complaints of weakness. She has a history of hyponatremia and adrenal insufficiency due to surgery to remove a tumor from her chest when she was a child. Her workup reveals no electrolyte abnormalities in her blood pressure and vital signs are stable. She was hydrated and appears to be feeling better. At this point, I see no indication for hospitalization leave she is appropriate for discharge. She is to follow-up with her primary Dr. if not improving and return to the ER if her symptoms worsen.  New Prescriptions New Prescriptions   No medications on file     Veryl Speak, MD 04/16/16 [phone]

## 2016-04-16 NOTE — Discharge Instructions (Signed)
Follow-up with your primary Dr. in the next 2-3 days, and return to the emergency department if your symptoms significantly worsen or change.

## 2016-04-16 NOTE — ED Triage Notes (Signed)
Pt reports having generalized fatigue and sob with mild exertion since last night. Denies any swelling. No resp distress noted at triage.

## 2017-01-23 ENCOUNTER — Emergency Department (HOSPITAL_COMMUNITY): Payer: Medicaid Other

## 2017-01-23 ENCOUNTER — Other Ambulatory Visit: Payer: Self-pay

## 2017-01-23 ENCOUNTER — Emergency Department (HOSPITAL_COMMUNITY)
Admission: EM | Admit: 2017-01-23 | Discharge: 2017-01-24 | Disposition: A | Discharge status: Home / Self Care | Payer: Medicaid Other | Attending: Emergency Medicine | Admitting: Emergency Medicine

## 2017-01-23 ENCOUNTER — Encounter (HOSPITAL_COMMUNITY): Payer: Self-pay

## 2017-01-23 DIAGNOSIS — Z79899 Other long term (current) drug therapy: Secondary | ICD-10-CM | POA: Diagnosis not present

## 2017-01-23 DIAGNOSIS — R51 Headache: Secondary | ICD-10-CM | POA: Diagnosis not present

## 2017-01-23 DIAGNOSIS — R55 Syncope and collapse: Secondary | ICD-10-CM | POA: Insufficient documentation

## 2017-01-23 DIAGNOSIS — J45909 Unspecified asthma, uncomplicated: Secondary | ICD-10-CM | POA: Insufficient documentation

## 2017-01-23 DIAGNOSIS — Z7982 Long term (current) use of aspirin: Secondary | ICD-10-CM | POA: Diagnosis not present

## 2017-01-23 DIAGNOSIS — E039 Hypothyroidism, unspecified: Secondary | ICD-10-CM | POA: Insufficient documentation

## 2017-01-23 DIAGNOSIS — Z8673 Personal history of transient ischemic attack (TIA), and cerebral infarction without residual deficits: Secondary | ICD-10-CM | POA: Diagnosis not present

## 2017-01-23 DIAGNOSIS — R519 Headache, unspecified: Secondary | ICD-10-CM

## 2017-01-23 LAB — CBC WITH DIFFERENTIAL/PLATELET
Basophils Absolute: 0 K/uL (ref 0.0–0.1)
Basophils Relative: 0 %
Eosinophils Absolute: 0.5 K/uL (ref 0.0–0.7)
Eosinophils Relative: 8 %
HCT: 42.2 % (ref 36.0–46.0)
Hemoglobin: 13.9 g/dL (ref 12.0–15.0)
Lymphocytes Relative: 39 %
Lymphs Abs: 2.6 K/uL (ref 0.7–4.0)
MCH: 30.3 pg (ref 26.0–34.0)
MCHC: 32.9 g/dL (ref 30.0–36.0)
MCV: 91.9 fL (ref 78.0–100.0)
Monocytes Absolute: 0.4 K/uL (ref 0.1–1.0)
Monocytes Relative: 7 %
Neutro Abs: 3.1 K/uL (ref 1.7–7.7)
Neutrophils Relative %: 46 %
Platelets: 282 K/uL (ref 150–400)
RBC: 4.59 MIL/uL (ref 3.87–5.11)
RDW: 14.3 % (ref 11.5–15.5)
WBC: 6.6 K/uL (ref 4.0–10.5)

## 2017-01-23 LAB — COMPREHENSIVE METABOLIC PANEL
ALT: 38 U/L (ref 14–54)
ANION GAP: 9 (ref 5–15)
AST: 50 U/L — ABNORMAL HIGH (ref 15–41)
Albumin: 3.6 g/dL (ref 3.5–5.0)
Alkaline Phosphatase: 50 U/L (ref 38–126)
BILIRUBIN TOTAL: 0.4 mg/dL (ref 0.3–1.2)
BUN: 24 mg/dL — AB (ref 6–20)
CO2: 23 mmol/L (ref 22–32)
Calcium: 9.6 mg/dL (ref 8.9–10.3)
Chloride: 103 mmol/L (ref 101–111)
Creatinine, Ser: 1.24 mg/dL — ABNORMAL HIGH (ref 0.44–1.00)
GFR calc Af Amer: 54 mL/min — ABNORMAL LOW (ref >60)
GFR, EST NON AFRICAN AMERICAN: 47 mL/min — AB (ref >60)
Glucose, Bld: 121 mg/dL — ABNORMAL HIGH (ref 65–99)
POTASSIUM: 4.4 mmol/L (ref 3.5–5.1)
Sodium: 135 mmol/L (ref 135–145)
TOTAL PROTEIN: 6.9 g/dL (ref 6.5–8.1)

## 2017-01-23 LAB — I-STAT TROPONIN, ED: Troponin i, poc: 0 ng/mL (ref 0.00–0.08)

## 2017-01-23 MED ORDER — ACETAMINOPHEN 325 MG PO TABS
650.0000 mg | ORAL_TABLET | Freq: Once | ORAL | Status: AC
  Administered 2017-01-23: 650 mg via ORAL
  Filled 2017-01-23: qty 2

## 2017-01-23 MED ORDER — SODIUM CHLORIDE 0.9 % IV BOLUS (SEPSIS)
1000.0000 mL | Freq: Once | INTRAVENOUS | Status: AC
  Administered 2017-01-23: 1000 mL via INTRAVENOUS

## 2017-01-23 MED ORDER — KETOROLAC TROMETHAMINE 30 MG/ML IJ SOLN
30.0000 mg | Freq: Once | INTRAMUSCULAR | Status: AC
  Administered 2017-01-23: 30 mg via INTRAVENOUS
  Filled 2017-01-23: qty 1

## 2017-01-23 NOTE — ED Triage Notes (Signed)
BIB [facility] pt was walking in the park and started to feel sluggish so she sat down, put then felt diaphoretic, had a headache, blurred vision, and drooling. EMS states this happened before about 2 years ago and gave fluids and felt better. [facility] stated they did a neuro check equal grip and strenght, also stated that when they arrived intial pulse was 47 then shot up to 70 and stayed there.

## 2017-01-23 NOTE — ED Notes (Signed)
Patient transported to X-ray 

## 2017-01-23 NOTE — ED Provider Notes (Signed)
[facility] DEPT Provider Note   CSN: [id number] Arrival date & time: 01/23/17  2016     History   Chief Complaint Chief Complaint  Patient presents with  . Near Syncope    HPI [name] is a 61 y.o. female.  HPI [name] is a 61 y.o. female presents to emergency department after a near syncopal episode. Patient states she was walking outside with her family, when suddenly she started feeling lightheaded and felt like she was going to faint. She states she was very hot during the walk due to warm temperatures outside. States that she was able to be lowered to the ground before she lost consciousness completely. Currently complaining of headache. She reports generalized weakness. Denies any nausea or vomiting. No neck pain. No chest pain or palpitations. No abdominal pain. She states that she has had similar episodes in the past after being overheated. No treatment prior to coming into the emergency department.  Past Medical History:  Diagnosis Date  . 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 Amblyopia   . Anemia   . Arthritis    "in my lower back"  . Asthma   . Atrial myxoma   . Blindness of left eye 1978  . Blood transfusion   . Breast cyst   . Constipation   . 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 CVA (cerebral infarction)   . Depression   . Depression   . 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 Hemorrhoids   . Hyperlipemia   . Hypothyroidism    per office note dated 03/25/2014  . Migraine   . Peptic ulcer associated with Helicobacter pylori infection   . Reflux   . Sinus headache   . Stomach ulcer    "from the hydrocortisone"  . Stroke [facility]) 1978   "piece tumor broke off & went to left eye; leaving me blind"  . Thyroid disease    pt unsure if hyper or hypo???    Patient Active Problem List   Diagnosis Date Noted  . Headache 11/02/2013  . Dizziness 11/02/2013  . Anemia 01/10/2013  . Chest pain, unspecified 01/10/2013  . Adjustment disorder with mixed  anxiety and depressed mood 01/10/2013  . Adrenal crisis ([place]) 01/07/2013  . Nausea & vomiting 08/03/2011  . Hypotension 08/03/2011  . Adrenal insufficiency ([place]) 08/03/2011  . Hyponatremia 08/03/2011  . Hypothyroidism 08/03/2011  . Depression 08/03/2011    Past Surgical History:  Procedure Laterality Date  . ADRENAL GLAND SURGERY  1982   on hydrocortisone  . ATRIAL MYXOMA EXCISION  1978  . BREAST BIOPSY  07/1999   ductal tissue excision & bx right; bx only left breast  . EYE SURGERY    . LEFT HEART CATHETERIZATION WITH CORONARY ANGIOGRAM N/A 10/06/2011   Procedure: LEFT HEART CATHETERIZATION WITH CORONARY ANGIOGRAM;  Surgeon: Clent Demark, MD;  Location: [facility] CATH LAB;  Service: Cardiovascular;  Laterality: N/A;  . repaired cross eyed     "as a child"  . ROTATOR CUFF REPAIR  05/2005   left  . VAGINAL HYSTERECTOMY  2000   "partial; w/left ovary"    OB History    No data available       Home Medications    Prior to Admission medications   Medication Sig Start Date End Date Taking? Authorizing Provider  aspirin EC 81 MG tablet Take 81 mg by mouth every morning.   Yes [provider]  gabapentin (NEURONTIN) 300 MG capsule Take 1 capsule (300 mg  total) by mouth 2 (two) times daily. 1/88/41  Yes Delora Fuel, MD  hydrocortisone (CORTEF) 20 MG tablet Take 0.5-1 tablets (10-20 mg total) by mouth 2 (two) times daily. Take 1 tablet every morning and 1/2 tablet every evening Patient taking differently: Take 10-20 mg by mouth See admin instructions. Take 1 tablet every morning and 1/2 tablet every evening 6/60/63  Yes Delora Fuel, MD  levothyroxine (SYNTHROID, LEVOTHROID) 75 MCG tablet Take 1 tablet (75 mcg total) by mouth daily. 0/16/01  Yes Delora Fuel, MD  metoprolol tartrate (LOPRESSOR) 25 MG tablet Take 0.5 tablets (12.5 mg total) by mouth 2 (two) times daily. 0/93/23  Yes Delora Fuel, MD  Multiple Vitamin (MULTIVITAMIN WITH MINERALS) TABS Take 1 tablet by mouth daily.    Yes [provider]  omeprazole (PRILOSEC) 20 MG capsule Take 1 capsule (20 mg total) by mouth 2 (two) times daily. 5/57/32  Yes Delora Fuel, MD  orphenadrine (NORFLEX) 100 MG tablet Take 1 tablet (100 mg total) by mouth 2 (two) times daily. 10/13/52  Yes Delora Fuel, MD  predniSONE (DELTASONE) 20 MG tablet Take 2 tablets (40 mg total) by mouth daily with breakfast. 09/07/14  Yes West, Emily, PA-C  venlafaxine XR (EFFEXOR-XR) 150 MG 24 hr capsule Take 1 capsule (150 mg total) by mouth daily. 2/70/62  Yes Delora Fuel, MD  HYDROcodone-acetaminophen (NORCO/VICODIN) 5-325 MG tablet Take 1-2 tablets by mouth every 4 (four) hours as needed for moderate pain or severe pain. Patient not taking: Reported on 3/76/2831 01/26/60   Delora Fuel, MD    Family History Family History  Problem Relation Age of Onset  . Colon cancer Sister 77  . Acromegaly Father   . Heart disease Father        Tumor in the heart  . Stomach cancer Father   . Diabetes Sister   . Kidney disease Sister        x2  . Heart disease Sister        Tumor in the heart  . Heart disease Daughter        Tumor in the heart  . Colon polyps Neg Hx   . Esophageal cancer Neg Hx   . Gallbladder disease Neg Hx     Social History Social History  Substance Use Topics  . Smoking status: Never Smoker  . Smokeless tobacco: Never Used  . Alcohol use No     Allergies   Aspirin; Ibuprofen [ibuprofen]; and Penicillins   Review of Systems Review of Systems  Constitutional: Positive for fatigue. Negative for chills and fever.  Respiratory: Negative for cough, chest tightness and shortness of breath.   Cardiovascular: Negative for chest pain, palpitations and leg swelling.  Gastrointestinal: Negative for abdominal pain, diarrhea, nausea and vomiting.  Genitourinary: Negative for dysuria, flank pain and pelvic pain.  Musculoskeletal: Negative for arthralgias, myalgias, neck pain and neck stiffness.  Skin: Negative for  rash.  Neurological: Positive for dizziness, weakness and light-headedness. Negative for syncope, speech difficulty, numbness and headaches.  All other systems reviewed and are negative.    Physical Exam Updated Vital Signs BP 100/67   Pulse 68   Resp 20   Ht 4\' 11"  (1.499 m)   Wt 72.6 kg   SpO2 96%   BMI 32.32 kg/m   Physical Exam  Constitutional: She is oriented to person, place, and time. She appears well-developed and well-nourished. No distress.  HENT:  Head: Normocephalic and atraumatic.  Eyes: Conjunctivae and EOM are normal. Pupils are equal,  round, and reactive to light.  Neck: Neck supple.  Cardiovascular: Normal rate, regular rhythm and normal heart sounds.   Pulmonary/Chest: Effort normal and breath sounds normal. No respiratory distress. She has no wheezes. She has no rales.  Abdominal: Soft. Bowel sounds are normal. She exhibits no distension. There is no tenderness. There is no rebound.  Musculoskeletal: She exhibits no edema.  Neurological: She is alert and oriented to person, place, and time. She displays normal reflexes. No cranial nerve deficit. Coordination normal.  Skin: Skin is warm and dry.  Psychiatric: She has a normal mood and affect. Her behavior is normal.  Nursing note and vitals reviewed.    ED Treatments / Results  Labs (all labs ordered are listed, but only abnormal results are displayed) Labs Reviewed  COMPREHENSIVE METABOLIC PANEL - Abnormal; Notable for the following:       Result Value   Glucose, Bld 121 (*)    BUN 24 (*)    Creatinine, Ser 1.24 (*)    AST 50 (*)    GFR calc non Af Amer 47 (*)    GFR calc Af Amer 54 (*)    All other components within normal limits  CBC WITH DIFFERENTIAL/PLATELET  I-STAT TROPOININ, ED    EKG  EKG Interpretation None      ED ECG REPORT   Date: 01/23/2017  Rate: 66  Rhythm: normal sinus rhythm  QRS Axis: normal  Intervals: normal  ST/T Wave abnormalities: nonspecific T wave changes   Conduction Disutrbances:none  Narrative Interpretation:   Old EKG Reviewed: unchanged  I have personally reviewed the EKG tracing and agree with the computerized printout as noted.  Radiology Dg Chest 2 View  Result Date: 01/23/2017 CLINICAL DATA:  Syncope. EXAM: CHEST  2 VIEW COMPARISON:  04/16/2016 FINDINGS: Patient is post median sternotomy. Abdominal pacing wires are unchanged in position. This is unchanged. Unchanged heart size and mediastinal contours. No pulmonary edema, focal airspace disease, pleural effusion or pneumothorax. Ill surgical clips in the upper abdomen. IMPRESSION: No acute abnormality Electronically Signed   By: Jeb Levering M.D.   On: 01/23/2017 21:58   Ct Head Wo Contrast  Result Date: 01/23/2017 CLINICAL DATA:  Migraine headache and near syncope today. EXAM: CT HEAD WITHOUT CONTRAST TECHNIQUE: Contiguous axial images were obtained from the base of the skull through the vertex without intravenous contrast. COMPARISON:  CT from 11/02/2013 FINDINGS: Brain: No evidence of acute infarction, hemorrhage, hydrocephalus, extra-axial collection or mass lesion/mass effect. Partially empty pituitary sella. Chronic stable mild superficial atrophy with sulcal prominence. Vascular: No hyperdense vessel or unexpected calcification. Skull: Normal. Negative for fracture or focal lesion. Sinuses/Orbits: Mild circumferential mucosal thickening of the visualized maxillary sinuses. Mild-to-moderate mucosal opacification of the ethmoid sinus. Under pneumatized frontal sinus. Other: None IMPRESSION: 1. Chronic stable superficial atrophy. No acute intracranial abnormality. Partially empty appearing pituitary sella. 2. Chronic paranasal sinusitis. Electronically Signed   By: Ashley Royalty M.D.   On: 01/23/2017 23:03    Procedures Procedures (including critical care time)  Medications Ordered in ED Medications  ketorolac (TORADOL) 30 MG/ML injection 30 mg (not administered)  acetaminophen  (TYLENOL) tablet 650 mg (650 mg Oral Given 01/23/17 2120)  sodium chloride 0.9 % bolus 1,000 mL (1,000 mLs Intravenous New Bag/Given 01/23/17 2122)     Initial Impression / Assessment and Plan / ED Course  I have reviewed the triage vital signs and the nursing notes.  Pertinent labs & imaging results that were available during my care of  the patient were reviewed by me and considered in my medical decision making (see chart for details).     Patient emergency department near syncopal episode while walking in the hot weather. She reports similar symptoms in the past after being overheated. She also notes that she does not believe she has eaten any meals today yet because he slept in today. Patient does not appear to be in any distress, she is complaining of some headache. Will give Tylenol for the headache, will get labs, chest x-ray, monitor.   11:27 PM Workup unremarkable. BUN/creatinine is slightly elevated, IV fluids administered. CBC normal. CT head and chest x-ray unremarkable. Patient initially did not have any relief of headache with Tylenol, Toradol given.  12:22 AM Patient states she feels slightly improved after Toradol. She admitted that she gets these headaches almost every other day and her doctor used to give her "some type of medicine" the name of which she does not remember. She stated that this headache started definitely after her syncopal episode, which is within 6 hours of CT scan that was performed which showed no evidence of intracranial bleeding. She has no neurological symptoms. She appears to be comfortable, no vomiting, no blurred vision, no other associated symptoms. I do not think she needs any further workup for this headache. She would like to go home. We will discharge her home with close outpatient follow-up. Advised to drink plenty of fluids, rest, stay out of the heat, follow-up with family doctor as needed.  Vitals:   01/23/17 2115 01/23/17 2230 01/23/17 2300  01/23/17 2330  BP: 105/65 100/67 (!) 94/59 104/63  Pulse: 67 68 73 78  Resp: 18 20 12    SpO2: 100% 96% 97% 98%  Weight:      Height:         Final Clinical Impressions(s) / ED Diagnoses   Final diagnoses:  Near syncope  Nonintractable headache, unspecified chronicity pattern, unspecified headache type    New Prescriptions New Prescriptions   No medications on file     Jeannett Senior, PA-C 01/24/17 [id number]

## 2017-01-24 NOTE — Discharge Instructions (Signed)
Drink plenty of fluids. Rest. Take Tylenol for headache. Follow-up with family doctor if continued to have symptoms.

## 2017-01-24 NOTE — ED Provider Notes (Signed)
Medical screening examination/treatment/procedure(s) were performed by non-physician practitioner and as supervising physician I was immediately available for consultation/collaboration.   EKG Interpretation  Date/Time:01/23/2017 20:23:02    Ventricular Rate: 66   PR Interval: 139   QRS Duration: 89   QT Interval: 408   QTC Calculation:428   Axis: Normal     Text Interpretation: Normal sinus rhythm. Nonspecific T-wave abnormalities in the lateral leads. No STEMI. No Brugada, dysrhythmias, or blocks.             Fatima Blank, MD 01/24/17 [phone]

## 2017-01-24 NOTE — ED Notes (Signed)
PT states understanding of care given, follow up care. PT wheelchaired from ED to car with a steady gait.

## 2017-03-05 DIAGNOSIS — R112 Nausea with vomiting, unspecified: Secondary | ICD-10-CM | POA: Insufficient documentation

## 2017-03-05 DIAGNOSIS — Z7982 Long term (current) use of aspirin: Secondary | ICD-10-CM | POA: Insufficient documentation

## 2017-03-05 DIAGNOSIS — Z7952 Long term (current) use of systemic steroids: Secondary | ICD-10-CM | POA: Insufficient documentation

## 2017-03-05 DIAGNOSIS — Z79899 Other long term (current) drug therapy: Secondary | ICD-10-CM | POA: Insufficient documentation

## 2017-03-05 DIAGNOSIS — J45909 Unspecified asthma, uncomplicated: Secondary | ICD-10-CM | POA: Diagnosis not present

## 2017-03-05 DIAGNOSIS — E039 Hypothyroidism, unspecified: Secondary | ICD-10-CM | POA: Insufficient documentation

## 2017-03-06 ENCOUNTER — Emergency Department (HOSPITAL_COMMUNITY)
Admission: EM | Admit: 2017-03-06 | Discharge: 2017-03-06 | Disposition: A | Discharge status: Home / Self Care | Payer: Medicaid Other | Attending: Emergency Medicine | Admitting: Emergency Medicine

## 2017-03-06 ENCOUNTER — Telehealth: Payer: Self-pay | Admitting: *Deleted

## 2017-03-06 ENCOUNTER — Encounter (HOSPITAL_COMMUNITY): Payer: Self-pay

## 2017-03-06 ENCOUNTER — Emergency Department (HOSPITAL_COMMUNITY): Payer: Medicaid Other

## 2017-03-06 DIAGNOSIS — R112 Nausea with vomiting, unspecified: Secondary | ICD-10-CM

## 2017-03-06 LAB — CBC WITH DIFFERENTIAL/PLATELET
BASOS PCT: 0 %
Basophils Absolute: 0 10*3/uL (ref 0.0–0.1)
EOS ABS: 0.4 10*3/uL (ref 0.0–0.7)
Eosinophils Relative: 4 %
HCT: 39 % (ref 36.0–46.0)
HEMOGLOBIN: 12.7 g/dL (ref 12.0–15.0)
Lymphocytes Relative: 38 %
Lymphs Abs: 3.3 10*3/uL (ref 0.7–4.0)
MCH: 29.7 pg (ref 26.0–34.0)
MCHC: 32.6 g/dL (ref 30.0–36.0)
MCV: 91.1 fL (ref 78.0–100.0)
MONOS PCT: 9 %
Monocytes Absolute: 0.8 10*3/uL (ref 0.1–1.0)
NEUTROS PCT: 49 %
Neutro Abs: 4.2 10*3/uL (ref 1.7–7.7)
PLATELETS: 304 10*3/uL (ref 150–400)
RBC: 4.28 MIL/uL (ref 3.87–5.11)
RDW: 14 % (ref 11.5–15.5)
WBC: 8.7 10*3/uL (ref 4.0–10.5)

## 2017-03-06 LAB — COMPREHENSIVE METABOLIC PANEL
ALBUMIN: 3.7 g/dL (ref 3.5–5.0)
ALK PHOS: 51 U/L (ref 38–126)
ALT: 16 U/L (ref 14–54)
ANION GAP: 10 (ref 5–15)
AST: 23 U/L (ref 15–41)
BUN: 17 mg/dL (ref 6–20)
CALCIUM: 8.9 mg/dL (ref 8.9–10.3)
CHLORIDE: 101 mmol/L (ref 101–111)
CO2: 29 mmol/L (ref 22–32)
Creatinine, Ser: 1.01 mg/dL — ABNORMAL HIGH (ref 0.44–1.00)
GFR calc non Af Amer: >60 mL/min (ref >60)
GLUCOSE: 80 mg/dL (ref 65–99)
POTASSIUM: 3.9 mmol/L (ref 3.5–5.1)
SODIUM: 140 mmol/L (ref 135–145)
Total Bilirubin: 0.3 mg/dL (ref 0.3–1.2)
Total Protein: 6.9 g/dL (ref 6.5–8.1)

## 2017-03-06 LAB — LIPASE, BLOOD: Lipase: 17 U/L (ref 11–51)

## 2017-03-06 MED ORDER — ONDANSETRON HCL 4 MG PO TABS: 4.0000 mg | ORAL_TABLET | Freq: Four times a day (QID) | ORAL | 0 refills | Status: DC

## 2017-03-06 MED ORDER — POLYETHYLENE GLYCOL 3350 17 GM/SCOOP PO POWD: 17.0000 g | Freq: Two times a day (BID) | ORAL | 0 refills | Status: DC

## 2017-03-06 MED ORDER — ACETAMINOPHEN 500 MG PO TABS
1000.0000 mg | ORAL_TABLET | Freq: Once | ORAL | Status: AC
  Administered 2017-03-06: 1000 mg via ORAL
  Filled 2017-03-06: qty 2

## 2017-03-06 MED ORDER — ONDANSETRON 8 MG PO TBDP
8.0000 mg | ORAL_TABLET | Freq: Once | ORAL | Status: AC
  Administered 2017-03-06: 8 mg via ORAL
  Filled 2017-03-06: qty 1

## 2017-03-06 NOTE — ED Provider Notes (Signed)
[facility] DEPT Provider Note   CSN: [id number] Arrival date & time: 03/05/17  2207     History   Chief Complaint Chief Complaint  Patient presents with  . Emesis    HPI [name] is a 61 y.o. female.  Patient presents emergency department with chief complaint of nausea and vomiting. She states the symptoms started yesterday. She also reports being constipated. Additionally, patient states that she has a cough. She states that she has been unable to see her doctor for these symptoms because she lost her Medicaid. She is concerned because she does not have any of her medications. She denies any chest pain or abdominal pain. Denies any fever or chills, but is noted to have a mildly elevated temperature of 100 in triage.   The history is provided by the patient. No language interpreter was used.    Past Medical History:  Diagnosis Date  . 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 Amblyopia   . Anemia   . Arthritis    "in my lower back"  . Asthma   . Atrial myxoma   . Blindness of left eye 1978  . Blood transfusion   . Breast cyst   . Constipation   . 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 CVA (cerebral infarction)   . Depression   . Depression   . 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 Hemorrhoids   . Hyperlipemia   . Hypothyroidism    per office note dated 03/25/2014  . Migraine   . Peptic ulcer associated with Helicobacter pylori infection   . Reflux   . Sinus headache   . Stomach ulcer    "from the hydrocortisone"  . Stroke [facility]) 1978   "piece tumor broke off & went to left eye; leaving me blind"  . Thyroid disease    pt unsure if hyper or hypo???    Patient Active Problem List   Diagnosis Date Noted  . Headache 11/02/2013  . Dizziness 11/02/2013  . Anemia 01/10/2013  . Chest pain, unspecified 01/10/2013  . Adjustment disorder with mixed anxiety and depressed mood 01/10/2013  . Adrenal crisis ([place]) 01/07/2013  . Nausea & vomiting 08/03/2011  . Hypotension  08/03/2011  . Adrenal insufficiency ([place]) 08/03/2011  . Hyponatremia 08/03/2011  . Hypothyroidism 08/03/2011  . Depression 08/03/2011    Past Surgical History:  Procedure Laterality Date  . ADRENAL GLAND SURGERY  1982   on hydrocortisone  . ATRIAL MYXOMA EXCISION  1978  . BREAST BIOPSY  07/1999   ductal tissue excision & bx right; bx only left breast  . EYE SURGERY    . LEFT HEART CATHETERIZATION WITH CORONARY ANGIOGRAM N/A 10/06/2011   Procedure: LEFT HEART CATHETERIZATION WITH CORONARY ANGIOGRAM;  Surgeon: Clent Demark, MD;  Location: [facility] CATH LAB;  Service: Cardiovascular;  Laterality: N/A;  . repaired cross eyed     "as a child"  . ROTATOR CUFF REPAIR  05/2005   left  . VAGINAL HYSTERECTOMY  2000   "partial; w/left ovary"    OB History    No data available       Home Medications    Prior to Admission medications   Medication Sig Start Date End Date Taking? Authorizing Provider  aspirin EC 81 MG tablet Take 81 mg by mouth every morning.    [provider]  gabapentin (NEURONTIN) 300 MG capsule Take 1 capsule (300 mg total) by mouth 2 (two) times daily. 0/34/74   Delora Fuel, MD  HYDROcodone-acetaminophen (NORCO/VICODIN)  5-325 MG tablet Take 1-2 tablets by mouth every 4 (four) hours as needed for moderate pain or severe pain. Patient not taking: Reported on 05/12/5175 1/60/73   Delora Fuel, MD  hydrocortisone (CORTEF) 20 MG tablet Take 0.5-1 tablets (10-20 mg total) by mouth 2 (two) times daily. Take 1 tablet every morning and 1/2 tablet every evening Patient taking differently: Take 10-20 mg by mouth See admin instructions. Take 1 tablet every morning and 1/2 tablet every evening 03/20/61   Delora Fuel, MD  levothyroxine (SYNTHROID, LEVOTHROID) 75 MCG tablet Take 1 tablet (75 mcg total) by mouth daily. 6/94/85   Delora Fuel, MD  metoprolol tartrate (LOPRESSOR) 25 MG tablet Take 0.5 tablets (12.5 mg total) by mouth 2 (two) times daily. 4/62/70   Delora Fuel, MD   Multiple Vitamin (MULTIVITAMIN WITH MINERALS) TABS Take 1 tablet by mouth daily.    [provider]  omeprazole (PRILOSEC) 20 MG capsule Take 1 capsule (20 mg total) by mouth 2 (two) times daily. 3/50/09   Delora Fuel, MD  orphenadrine (NORFLEX) 100 MG tablet Take 1 tablet (100 mg total) by mouth 2 (two) times daily. 3/81/82   Delora Fuel, MD  predniSONE (DELTASONE) 20 MG tablet Take 2 tablets (40 mg total) by mouth daily with breakfast. 09/07/14   Clayton Bibles, PA-C  venlafaxine XR (EFFEXOR-XR) 150 MG 24 hr capsule Take 1 capsule (150 mg total) by mouth daily. 9/93/71   Delora Fuel, MD    Family History Family History  Problem Relation Age of Onset  . Colon cancer Sister 42  . Acromegaly Father   . Heart disease Father        Tumor in the heart  . Stomach cancer Father   . Diabetes Sister   . Kidney disease Sister        x2  . Heart disease Sister        Tumor in the heart  . Heart disease Daughter        Tumor in the heart  . Colon polyps Neg Hx   . Esophageal cancer Neg Hx   . Gallbladder disease Neg Hx     Social History Social History  Substance Use Topics  . Smoking status: Never Smoker  . Smokeless tobacco: Never Used  . Alcohol use No     Allergies   Aspirin; Ibuprofen [ibuprofen]; and Penicillins   Review of Systems Review of Systems  All other systems reviewed and are negative.    Physical Exam Updated Vital Signs BP 112/74 (BP Location: Right Arm)   Pulse (!) 115   Temp 100 F (37.8 C) (Oral)   Resp 20   Ht 5' (1.524 m)   Wt 78.9 kg (174 lb)   SpO2 100%   BMI 33.98 kg/m   Physical Exam  Constitutional: She is oriented to person, place, and time. She appears well-developed and well-nourished.  HENT:  Head: Normocephalic and atraumatic.  Eyes: Conjunctivae and EOM are normal. Pupils are equal, round, and reactive to light.  Neck: Normal range of motion. Neck supple.  Cardiovascular: Normal rate and regular rhythm.  Exam reveals no  gallop and no friction rub.   No murmur heard. Pulmonary/Chest: Effort normal and breath sounds normal. No respiratory distress. She has no wheezes. She has no rales. She exhibits no tenderness.  Abdominal: Soft. Bowel sounds are normal. She exhibits no distension and no mass. There is no tenderness. There is no rebound and no guarding.  No focal abdominal tenderness, no RLQ  tenderness or pain at McBurney's point, no RUQ tenderness or Murphy's sign, no left-sided abdominal tenderness, no fluid wave, or signs of peritonitis   Musculoskeletal: Normal range of motion. She exhibits no edema or tenderness.  Neurological: She is alert and oriented to person, place, and time.  Skin: Skin is warm and dry.  Psychiatric: She has a normal mood and affect. Her behavior is normal. Judgment and thought content normal.  Nursing note and vitals reviewed.    ED Treatments / Results  Labs (all labs ordered are listed, but only abnormal results are displayed) Labs Reviewed  COMPREHENSIVE METABOLIC PANEL - Abnormal; Notable for the following:       Result Value   Creatinine, Ser 1.01 (*)    All other components within normal limits  CBC WITH DIFFERENTIAL/PLATELET  LIPASE, BLOOD    EKG  EKG Interpretation None       Radiology Dg Abd Acute W/chest  Result Date: 03/06/2017 CLINICAL DATA:  Cough, vomiting, lower abdominal pain. EXAM: DG ABDOMEN ACUTE W/ 1V CHEST COMPARISON:  01/23/2017 chest radiograph FINDINGS: Stable normal cardiac silhouette. Clear lungs. Post median sternotomy with wires in unchanged alignment. Mild S-shaped curvature of the spine. Normal bowel gas pattern. No pneumoperitoneum identified. Surgical clips project over the mid upper abdomen. IMPRESSION: Negative abdominal radiographs.  No acute cardiopulmonary disease. Electronically Signed   By: Kristine Garbe M.D.   On: 03/06/2017 02:52    Procedures Procedures (including critical care time)  Medications Ordered in  ED Medications - No data to display   Initial Impression / Assessment and Plan / ED Course  I have reviewed the triage vital signs and the nursing notes.  Pertinent labs & imaging results that were available during my care of the patient were reviewed by me and considered in my medical decision making (see chart for details).    Patient with nausea and vomiting. Symptoms started yesterday. She has no focal abdominal tenderness. She denies any dysuria or hematuria. She does not have any evidence of acute abdomen. She is noted to have a mildly elevated temperature of 100. Her symptoms are viral in nature. Her laboratory work is reassuring. She is in no acute distress. She is not vomiting now. Will discharge with Zofran and will recommend close primary care follow-up. She is concerned that she does not have a primary care doctor, and recently lost her Medicaid. Have placed a consult to case management in the hopes that she can get some assistance in obtaining primary care follow-up. Specific return precautions given. Patient understands agrees the plan. She is stable and ready for discharge.  Final Clinical Impressions(s) / ED Diagnoses   Final diagnoses:  Non-intractable vomiting with nausea, unspecified vomiting type    New Prescriptions New Prescriptions   ONDANSETRON (ZOFRAN) 4 MG TABLET    Take 1 tablet (4 mg total) by mouth every 6 (six) hours.   POLYETHYLENE GLYCOL POWDER (GLYCOLAX/MIRALAX) POWDER    Take 17 g by mouth 2 (two) times daily. Until daily soft stools  OTC     Montine Circle, PA-C 03/06/17 0515    Orpah Greek, MD 03/11/17 2330

## 2017-03-06 NOTE — Telephone Encounter (Signed)
Antonina Deziel J. Clydene Laming, RN, BSN, [place]  [facility] set up appointment with [place] on 03/19/17.  Spoke with pt via telephone and advised to please arrive 15 min early and take a picture ID and your current medications.  Pt verbalizes understanding of keeping appointment.

## 2017-03-06 NOTE — ED Triage Notes (Signed)
Pt complains of vomiting today and having a distended abdomen Pt states that she's been out of her meds for one month Pt's son states that she passed out at home

## 2017-03-19 ENCOUNTER — Inpatient Hospital Stay (INDEPENDENT_AMBULATORY_CARE_PROVIDER_SITE_OTHER): Payer: Medicaid Other | Admitting: Physician Assistant

## 2017-04-27 ENCOUNTER — Emergency Department (HOSPITAL_COMMUNITY): Payer: Medicaid Other

## 2017-04-27 ENCOUNTER — Emergency Department (HOSPITAL_COMMUNITY)
Admission: EM | Admit: 2017-04-27 | Discharge: 2017-04-27 | Disposition: A | Discharge status: Home / Self Care | Payer: Medicaid Other | Attending: Emergency Medicine | Admitting: Emergency Medicine

## 2017-04-27 ENCOUNTER — Encounter (HOSPITAL_COMMUNITY): Payer: Self-pay | Admitting: *Deleted

## 2017-04-27 ENCOUNTER — Other Ambulatory Visit: Payer: Self-pay | Admitting: Cardiology

## 2017-04-27 DIAGNOSIS — Z7982 Long term (current) use of aspirin: Secondary | ICD-10-CM | POA: Insufficient documentation

## 2017-04-27 DIAGNOSIS — R1084 Generalized abdominal pain: Secondary | ICD-10-CM | POA: Diagnosis not present

## 2017-04-27 DIAGNOSIS — E039 Hypothyroidism, unspecified: Secondary | ICD-10-CM | POA: Diagnosis not present

## 2017-04-27 DIAGNOSIS — Z8673 Personal history of transient ischemic attack (TIA), and cerebral infarction without residual deficits: Secondary | ICD-10-CM | POA: Insufficient documentation

## 2017-04-27 DIAGNOSIS — R0789 Other chest pain: Secondary | ICD-10-CM | POA: Diagnosis not present

## 2017-04-27 DIAGNOSIS — J45909 Unspecified asthma, uncomplicated: Secondary | ICD-10-CM | POA: Insufficient documentation

## 2017-04-27 DIAGNOSIS — K279 Peptic ulcer, site unspecified, unspecified as acute or chronic, without hemorrhage or perforation: Secondary | ICD-10-CM | POA: Diagnosis not present

## 2017-04-27 DIAGNOSIS — I10 Essential (primary) hypertension: Secondary | ICD-10-CM | POA: Diagnosis not present

## 2017-04-27 DIAGNOSIS — Z79899 Other long term (current) drug therapy: Secondary | ICD-10-CM | POA: Diagnosis not present

## 2017-04-27 DIAGNOSIS — K219 Gastro-esophageal reflux disease without esophagitis: Secondary | ICD-10-CM | POA: Diagnosis not present

## 2017-04-27 DIAGNOSIS — R252 Cramp and spasm: Secondary | ICD-10-CM | POA: Diagnosis not present

## 2017-04-27 DIAGNOSIS — R079 Chest pain, unspecified: Secondary | ICD-10-CM | POA: Diagnosis not present

## 2017-04-27 DIAGNOSIS — E785 Hyperlipidemia, unspecified: Secondary | ICD-10-CM | POA: Diagnosis not present

## 2017-04-27 DIAGNOSIS — G43909 Migraine, unspecified, not intractable, without status migrainosus: Secondary | ICD-10-CM | POA: Diagnosis not present

## 2017-04-27 LAB — CBC
HEMATOCRIT: 46.5 % — AB (ref 36.0–46.0)
HEMOGLOBIN: 15.7 g/dL — AB (ref 12.0–15.0)
MCH: 29.9 pg (ref 26.0–34.0)
MCHC: 33.8 g/dL (ref 30.0–36.0)
MCV: 88.6 fL (ref 78.0–100.0)
Platelets: 308 10*3/uL (ref 150–400)
RBC: 5.25 MIL/uL — AB (ref 3.87–5.11)
RDW: 13.4 % (ref 11.5–15.5)
WBC: 6.6 10*3/uL (ref 4.0–10.5)

## 2017-04-27 LAB — BASIC METABOLIC PANEL
ANION GAP: 10 (ref 5–15)
BUN: 16 mg/dL (ref 6–20)
CALCIUM: 9.9 mg/dL (ref 8.9–10.3)
CO2: 22 mmol/L (ref 22–32)
Chloride: 103 mmol/L (ref 101–111)
Creatinine, Ser: 1.14 mg/dL — ABNORMAL HIGH (ref 0.44–1.00)
GFR, EST NON AFRICAN AMERICAN: 52 mL/min — AB (ref >60)
GLUCOSE: 117 mg/dL — AB (ref 65–99)
POTASSIUM: 4.1 mmol/L (ref 3.5–5.1)
Sodium: 135 mmol/L (ref 135–145)

## 2017-04-27 LAB — I-STAT TROPONIN, ED
TROPONIN I, POC: 0 ng/mL (ref 0.00–0.08)
TROPONIN I, POC: 0 ng/mL (ref 0.00–0.08)

## 2017-04-27 MED ORDER — SODIUM CHLORIDE 0.9 % IV BOLUS (SEPSIS)
1000.0000 mL | Freq: Once | INTRAVENOUS | Status: AC
  Administered 2017-04-27: 1000 mL via INTRAVENOUS

## 2017-04-27 MED ORDER — NITROGLYCERIN 0.4 MG SL SUBL
0.4000 mg | SUBLINGUAL_TABLET | SUBLINGUAL | Status: DC | PRN
  Administered 2017-04-27: 0.4 mg via SUBLINGUAL
  Filled 2017-04-27: qty 1

## 2017-04-27 NOTE — ED Notes (Signed)
2x unsuccessful IV attempts.

## 2017-04-27 NOTE — ED Notes (Signed)
Patient provided with turkey sandwich and water

## 2017-04-27 NOTE — ED Triage Notes (Signed)
Pt just came back from Dr. Terrence Dupont and had a sharp chest pain and cramps under arms and chest.

## 2017-04-27 NOTE — ED Notes (Signed)
Patient verbalized understanding of discharge instructions and denies any further needs or questions at this time. VS stable. Patient ambulatory with steady gait.  

## 2017-04-27 NOTE — Discharge Instructions (Addendum)
Please follow-up with your cardiologist after your stress test.   Please fill your prescriptions given by your cardiologist at your most recent visit. If your pain reoccurs, please take your medication as prescribed.   In the event that your pain reoccurs and does not resolve with your nitrostat tablets and you are unable to reach your physician please return to the ED.  Thank you for visiting the [facility] ED today.

## 2017-04-27 NOTE — ED Provider Notes (Signed)
[facility] DEPT Provider Note   CSN: [id number] Arrival date & time: 04/27/17  0957  History   Chief Complaint Chief Complaint  Patient presents with  . Chest Pain   HPI [name] is a 61 y.o. female.  Patient presented with sudden onset left sided chest pain that does not radiate and is not associated with shortness of breath. 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 She stated that while waiting at her cardiologists office today she began to experience this left sided pain that was accompanied by muscle cramps in her bilateral triceps and abdomin. Patient states that the chest pain is similar to her previous stroke symptoms that occurred secondary to her myxoma and resolved after removal of the myxoma. She has been out of her medication for greater than one week and has a hand written script for nitrostat 0.4mg  tablets in her purse from her cardiologist. The pain has minimally improved at this time.      Past Medical History:  Diagnosis Date  . 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 Amblyopia   . Anemia   . Arthritis    "in my lower back"  . Asthma   . Atrial myxoma   . Blindness of left eye 1978  . Blood transfusion   . Breast cyst   . Constipation   . 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 CVA (cerebral infarction)   . Depression   . Depression   . 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 Hemorrhoids   . Hyperlipemia   . Hypothyroidism    per office note dated 03/25/2014  . Migraine   . Peptic ulcer associated with Helicobacter pylori infection   . Reflux   . Sinus headache   . Stomach ulcer    "from the hydrocortisone"  . Stroke [facility]) 1978   "piece tumor broke off & went to left eye; leaving me blind"  . Thyroid disease    pt unsure if hyper or hypo???    Patient Active Problem List   Diagnosis Date Noted  . Headache  11/02/2013  . Dizziness 11/02/2013  . Anemia 01/10/2013  . Chest pain, unspecified 01/10/2013  . Adjustment disorder with mixed anxiety and depressed mood 01/10/2013  . Adrenal crisis ([place]) 01/07/2013  . Nausea & vomiting 08/03/2011  . Hypotension 08/03/2011  . Adrenal insufficiency ([place]) 08/03/2011  . Hyponatremia 08/03/2011  . Hypothyroidism 08/03/2011  . Depression 08/03/2011    Past Surgical History:  Procedure Laterality Date  . ADRENAL GLAND SURGERY  1982   on hydrocortisone  . ATRIAL MYXOMA EXCISION  1978  . BREAST BIOPSY  07/1999   ductal tissue excision & bx right; bx only left breast  . EYE SURGERY    . LEFT HEART CATHETERIZATION WITH CORONARY ANGIOGRAM N/A 10/06/2011   Procedure: LEFT HEART CATHETERIZATION WITH CORONARY ANGIOGRAM;  Surgeon: Clent Demark, MD;  Location: [facility] CATH LAB;  Service: Cardiovascular;  Laterality: N/A;  . repaired cross eyed     "as a child"  . ROTATOR CUFF REPAIR  05/2005   left  . VAGINAL HYSTERECTOMY  2000   "partial; w/left ovary"    OB History    No data available       Home Medications    Prior to Admission medications   Medication Sig Start Date End Date Taking? Authorizing Provider  aspirin EC 81 MG  tablet Take 81 mg by mouth every morning.    [provider]  gabapentin (NEURONTIN) 300 MG capsule Take 1 capsule (300 mg total) by mouth 2 (two) times daily. Patient not taking: Reported on 03/07/349 0/93/81   Delora Fuel, MD  HYDROcodone-acetaminophen (NORCO/VICODIN) 5-325 MG tablet Take 1-2 tablets by mouth every 4 (four) hours as needed for moderate pain or severe pain. Patient not taking: Reported on 05/09/9370 6/96/78   Delora Fuel, MD  hydrocortisone (CORTEF) 20 MG tablet Take 0.5-1 tablets (10-20 mg total) by mouth 2 (two) times daily. Take 1 tablet every morning and 1/2 tablet every evening Patient taking differently: Take 10-20 mg by mouth See admin instructions. Take 1 tablet every morning and 1/2 tablet every  evening 9/38/10   Delora Fuel, MD  levothyroxine (SYNTHROID, LEVOTHROID) 75 MCG tablet Take 1 tablet (75 mcg total) by mouth daily. 1/75/10   Delora Fuel, MD  metoprolol tartrate (LOPRESSOR) 25 MG tablet Take 0.5 tablets (12.5 mg total) by mouth 2 (two) times daily. 2/58/52   Delora Fuel, MD  omeprazole (PRILOSEC) 20 MG capsule Take 1 capsule (20 mg total) by mouth 2 (two) times daily. 7/78/24   Delora Fuel, MD  ondansetron (ZOFRAN) 4 MG tablet Take 1 tablet (4 mg total) by mouth every 6 (six) hours. 03/06/17   Montine Circle, PA-C  orphenadrine (NORFLEX) 100 MG tablet Take 1 tablet (100 mg total) by mouth 2 (two) times daily. Patient not taking: Reported on 2/35/3614 4/31/54   Delora Fuel, MD  polyethylene glycol powder [facility]) powder Take 17 g by mouth 2 (two) times daily. Until daily soft stools  OTC 03/06/17   Montine Circle, PA-C  predniSONE (DELTASONE) 20 MG tablet Take 2 tablets (40 mg total) by mouth daily with breakfast. Patient not taking: Reported on 03/06/2017 09/07/14   Clayton Bibles, PA-C  venlafaxine XR (EFFEXOR-XR) 150 MG 24 hr capsule Take 1 capsule (150 mg total) by mouth daily. 0/08/67   Delora Fuel, MD    Family History Family History  Problem Relation Age of Onset  . Colon cancer Sister 62  . Acromegaly Father   . Heart disease Father        Tumor in the heart  . Stomach cancer Father   . Diabetes Sister   . Kidney disease Sister        x2  . Heart disease Sister        Tumor in the heart  . Heart disease Daughter        Tumor in the heart  . Colon polyps Neg Hx   . Esophageal cancer Neg Hx   . Gallbladder disease Neg Hx     Social History Social History  Substance Use Topics  . Smoking status: Never Smoker  . Smokeless tobacco: Never Used  . Alcohol use No     Allergies   Aspirin; Ibuprofen [ibuprofen]; and Penicillins   Review of Systems Review of Systems  Constitutional: Negative for chills and fever.  HENT: Negative for ear  pain and sore throat.   Eyes: Negative for pain and visual disturbance.  Respiratory: Positive for chest tightness. Negative for cough and shortness of breath.   Cardiovascular: Positive for chest pain. Negative for palpitations.  Gastrointestinal: Negative for abdominal pain and vomiting.  Genitourinary: Negative for dysuria and hematuria.  Musculoskeletal: Negative for arthralgias and back pain.  Skin: Negative for color change and rash.  Neurological: Negative for seizures and syncope.  All other systems reviewed and are negative.  Physical Exam Updated Vital Signs BP 108/84 (BP Location: Right Arm)   Pulse 94   Temp 98.3 F (36.8 C) (Oral)   Resp 18   SpO2 98%   Physical Exam  Constitutional: She is oriented to person, place, and time. She appears well-developed and well-nourished. No distress.  HENT:  Head: Normocephalic and atraumatic.  Eyes: Conjunctivae are normal.  Neck: Neck supple.  Cardiovascular: Normal rate and regular rhythm.   No murmur heard. Pulmonary/Chest: Effort normal and breath sounds normal. No respiratory distress.  Abdominal: Soft. There is no tenderness.  Musculoskeletal: She exhibits tenderness (To palpation of the left pec near the attachement with the humerus. Patient stated that this pain was the chest pain she was concerned about. ). She exhibits no edema.  Neurological: She is alert and oriented to person, place, and time.  Skin: Skin is warm and dry.  Psychiatric: She has a normal mood and affect. Her behavior is normal.  Nursing note and vitals reviewed.    ED Treatments / Results  Labs (all labs ordered are listed, but only abnormal results are displayed) Labs Reviewed  BASIC METABOLIC PANEL - Abnormal; Notable for the following:       Result Value   Glucose, Bld 117 (*)    Creatinine, Ser 1.14 (*)    GFR calc non Af Amer 52 (*)    All other components within normal limits  CBC - Abnormal; Notable for the following:    RBC 5.25  (*)    Hemoglobin 15.7 (*)    HCT 46.5 (*)    All other components within normal limits  I-STAT TROPONIN, ED    EKG  EKG Interpretation None       Radiology Dg Chest 2 View  Result Date: 04/27/2017 CLINICAL DATA:  Chest pain EXAM: CHEST  2 VIEW COMPARISON:  03/06/2017 chest radiograph. FINDINGS: Intact sternotomy wires. Retained epicardial pacer leads and surgical clips throughout the upper abdomen are stable. Stable cardiomediastinal silhouette with normal heart size. No pneumothorax. No pleural effusion. Lungs appear clear, with no acute consolidative airspace disease and no pulmonary edema. IMPRESSION: No active cardiopulmonary disease. Electronically Signed   By: Ilona Sorrel M.D.   On: 04/27/2017 12:11    Procedures Procedures (including critical care time)  Medications Ordered in ED Medications - No data to display   Initial Impression / Assessment and Plan / ED Course  I have reviewed the triage vital signs and the nursing notes.  Pertinent labs & imaging results that were available during my care of the patient were reviewed by me and considered in my medical decision making (see chart for details).    Patient presented with sudden onset chest pain after leaving her physicians office. She originally stated that it was while she was at the office. She received prescriptions there and proceeded to visit the bank. While getting out of the car at the bank, she recalled that her pain became more noticeable then.   2:00 PM, Patient stated that her pain is worse with with movement of the left arm and with flexion of the left pectoris major. Patient is tender to touch in that area.   2:20 PM, Spoke with Dr. Terrence Dupont, he stated that he was not impressed by the chest pain and given her mild ST changes he scheduled her for cardiac stress testing but had not told her to dome to the ED as she reported. After discussing this case with the patient, she stated that he had  in fact not told  her to visit the ED as per her original statement. Dr. Terrence Dupont was in agreement that if two troponins returned negative and with no new ECG changes that she could safely be discharged home for follow-up at her cardiac stress test she had scheduled.   Patient was informed of the plan and was clearly informed that if she developed new chest pain, if the pain failed to resolved or if she became diaphoretic, it radiated, short of breath or other concerning symptoms that she was to return to the ED. She agreed to the plan of discharge and to return if symptoms worsened.   Patient was seen and evaluated with Dr. Leonette Monarch.  Final Clinical Impressions(s) / ED Diagnoses   Final diagnoses:  None    New Prescriptions New Prescriptions   No medications on file     Kathi Ludwig, MD 04/29/17 1603    Fatima Blank, MD 05/02/17 [phone]

## 2017-05-07 ENCOUNTER — Ambulatory Visit (HOSPITAL_COMMUNITY): Payer: Medicaid Other

## 2017-05-07 ENCOUNTER — Encounter (HOSPITAL_COMMUNITY): Payer: Self-pay

## 2017-05-16 ENCOUNTER — Encounter (HOSPITAL_COMMUNITY)
Admission: RE | Admit: 2017-05-16 | Discharge: 2017-05-16 | Disposition: A | Discharge status: Home / Self Care | Payer: Medicaid Other | Source: Ambulatory Visit | Attending: Cardiology | Admitting: Cardiology

## 2017-05-16 MED ORDER — REGADENOSON 0.4 MG/5ML IV SOLN: 0.4000 mg | Freq: Once | INTRAVENOUS | Status: DC

## 2017-08-11 ENCOUNTER — Emergency Department (HOSPITAL_COMMUNITY)
Admission: EM | Admit: 2017-08-11 | Discharge: 2017-08-11 | Disposition: A | Discharge status: Home / Self Care | Payer: Medicaid Other | Attending: Physician Assistant | Admitting: Physician Assistant

## 2017-08-11 ENCOUNTER — Encounter (HOSPITAL_COMMUNITY): Payer: Self-pay

## 2017-08-11 ENCOUNTER — Emergency Department (HOSPITAL_COMMUNITY): Payer: Medicaid Other

## 2017-08-11 DIAGNOSIS — R112 Nausea with vomiting, unspecified: Secondary | ICD-10-CM | POA: Diagnosis not present

## 2017-08-11 DIAGNOSIS — E039 Hypothyroidism, unspecified: Secondary | ICD-10-CM | POA: Insufficient documentation

## 2017-08-11 DIAGNOSIS — Z79899 Other long term (current) drug therapy: Secondary | ICD-10-CM | POA: Diagnosis not present

## 2017-08-11 DIAGNOSIS — Z7982 Long term (current) use of aspirin: Secondary | ICD-10-CM | POA: Insufficient documentation

## 2017-08-11 DIAGNOSIS — R109 Unspecified abdominal pain: Secondary | ICD-10-CM | POA: Diagnosis present

## 2017-08-11 DIAGNOSIS — J45909 Unspecified asthma, uncomplicated: Secondary | ICD-10-CM | POA: Insufficient documentation

## 2017-08-11 DIAGNOSIS — R1084 Generalized abdominal pain: Secondary | ICD-10-CM | POA: Diagnosis not present

## 2017-08-11 DIAGNOSIS — R197 Diarrhea, unspecified: Secondary | ICD-10-CM | POA: Insufficient documentation

## 2017-08-11 LAB — COMPREHENSIVE METABOLIC PANEL
ALBUMIN: 3.9 g/dL (ref 3.5–5.0)
ALT: 16 U/L (ref 14–54)
AST: 35 U/L (ref 15–41)
Alkaline Phosphatase: 57 U/L (ref 38–126)
Anion gap: 10 (ref 5–15)
BUN: 15 mg/dL (ref 6–20)
CO2: 20 mmol/L — AB (ref 22–32)
Calcium: 9.9 mg/dL (ref 8.9–10.3)
Chloride: 102 mmol/L (ref 101–111)
Creatinine, Ser: 1.29 mg/dL — ABNORMAL HIGH (ref 0.44–1.00)
GFR calc Af Amer: 52 mL/min — ABNORMAL LOW (ref >60)
GFR calc non Af Amer: 45 mL/min — ABNORMAL LOW (ref >60)
GLUCOSE: 113 mg/dL — AB (ref 65–99)
POTASSIUM: 4.3 mmol/L (ref 3.5–5.1)
SODIUM: 132 mmol/L — AB (ref 135–145)
Total Bilirubin: 0.8 mg/dL (ref 0.3–1.2)
Total Protein: 7.7 g/dL (ref 6.5–8.1)

## 2017-08-11 LAB — CBC
HEMATOCRIT: 44.6 % (ref 36.0–46.0)
HEMOGLOBIN: 14.9 g/dL (ref 12.0–15.0)
MCH: 29.4 pg (ref 26.0–34.0)
MCHC: 33.4 g/dL (ref 30.0–36.0)
MCV: 88 fL (ref 78.0–100.0)
Platelets: 291 10*3/uL (ref 150–400)
RBC: 5.07 MIL/uL (ref 3.87–5.11)
RDW: 13.6 % (ref 11.5–15.5)
WBC: 7.4 10*3/uL (ref 4.0–10.5)

## 2017-08-11 LAB — LIPASE, BLOOD: LIPASE: 22 U/L (ref 11–51)

## 2017-08-11 LAB — I-STAT BETA HCG BLOOD, ED (MC, WL, AP ONLY): I-stat hCG, quantitative: <5 m[IU]/mL (ref <5)

## 2017-08-11 MED ORDER — ONDANSETRON 4 MG PO TBDP: 4.0000 mg | ORAL_TABLET | Freq: Three times a day (TID) | ORAL | 0 refills | Status: DC | PRN

## 2017-08-11 MED ORDER — SODIUM CHLORIDE 0.9 % IV BOLUS (SEPSIS)
1000.0000 mL | Freq: Once | INTRAVENOUS | Status: AC
  Administered 2017-08-11: 1000 mL via INTRAVENOUS

## 2017-08-11 MED ORDER — ONDANSETRON HCL 4 MG/2ML IJ SOLN
4.0000 mg | Freq: Once | INTRAMUSCULAR | Status: AC
  Administered 2017-08-11: 4 mg via INTRAVENOUS
  Filled 2017-08-11: qty 2

## 2017-08-11 MED ORDER — HYDROCORTISONE 20 MG PO TABS
20.0000 mg | ORAL_TABLET | Freq: Once | ORAL | Status: AC
  Administered 2017-08-11: 20 mg via ORAL
  Filled 2017-08-11: qty 1

## 2017-08-11 MED ORDER — HYDROCORTISONE 20 MG PO TABS: ORAL_TABLET | ORAL | 0 refills | Status: DC

## 2017-08-11 MED ORDER — ACETAMINOPHEN 325 MG PO TABS
650.0000 mg | ORAL_TABLET | Freq: Once | ORAL | Status: AC
  Administered 2017-08-11: 650 mg via ORAL
  Filled 2017-08-11: qty 2

## 2017-08-11 NOTE — ED Provider Notes (Signed)
[facility] EMERGENCY DEPARTMENT Provider Note   CSN: [id number] Arrival date & time: 08/11/17  0010     History   Chief Complaint Chief Complaint  Patient presents with  . Abdominal Pain    HPI [name] is a 61 y.o. female.  The history is provided by the patient and medical records.  Abdominal Pain   Associated symptoms include diarrhea, nausea and vomiting.     61 y.o. F with hx of adrenal insufficiency not currently on steroids, arthritis, anemia, CVA, depression, HLP, hypothyroidism, acid reflux, prior stroke, presenting to the ED for N/V/D x3 days.  States she has been having about 6-7 episodes of vomiting and diarrhea per day since onset.  Reports some generalized cramping pains in her abdomen but does not truly localize to one area over another.  Reports low grade fever at home, afebrile here.  Denies sock contacts, travel, abnormal food intake.  Prior abdominal surgeries include hysterectomy.  No medications taken PTA.  Past Medical History:  Diagnosis Date  . 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 Amblyopia   . Anemia   . Arthritis    "in my lower back"  . Asthma   . Atrial myxoma   . Blindness of left eye 1978  . Blood transfusion   . Breast cyst   . Constipation   . 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 CVA (cerebral infarction)   . Depression   . Depression   . 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 Hemorrhoids   . Hyperlipemia   . Hypothyroidism    per office note dated 03/25/2014  . Migraine   . Peptic ulcer associated with Helicobacter pylori infection   . Reflux   . Sinus headache   . Stomach ulcer    "from the hydrocortisone"  . Stroke [facility]) 1978   "piece tumor broke off & went to left eye; leaving me blind"  . Thyroid disease    pt unsure if hyper or hypo???    Patient Active Problem List   Diagnosis Date Noted  . Headache 11/02/2013  . Dizziness 11/02/2013  . Anemia 01/10/2013  . Chest pain, unspecified 01/10/2013  .  Adjustment disorder with mixed anxiety and depressed mood 01/10/2013  . Adrenal crisis ([place]) 01/07/2013  . Nausea & vomiting 08/03/2011  . Hypotension 08/03/2011  . Adrenal insufficiency ([place]) 08/03/2011  . Hyponatremia 08/03/2011  . Hypothyroidism 08/03/2011  . Depression 08/03/2011    Past Surgical History:  Procedure Laterality Date  . ADRENAL GLAND SURGERY  1982   on hydrocortisone  . ATRIAL MYXOMA EXCISION  1978  . BREAST BIOPSY  07/1999   ductal tissue excision & bx right; bx only left breast  . EYE SURGERY    . LEFT HEART CATHETERIZATION WITH CORONARY ANGIOGRAM N/A 10/06/2011   Procedure: LEFT HEART CATHETERIZATION WITH CORONARY ANGIOGRAM;  Surgeon: Clent Demark, MD;  Location: [facility] CATH LAB;  Service: Cardiovascular;  Laterality: N/A;  . repaired cross eyed     "as a child"  . ROTATOR CUFF REPAIR  05/2005   left  . VAGINAL HYSTERECTOMY  2000   "partial; w/left ovary"    OB History    No data available       Home Medications    Prior to Admission medications   Medication Sig Start Date End Date Taking? Authorizing Provider  aspirin EC 81 MG tablet Take 81 mg by mouth every morning.    [provider]  gabapentin (NEURONTIN) 300 MG  capsule Take 1 capsule (300 mg total) by mouth 2 (two) times daily. Patient not taking: Reported on 02/15/3709 03/06/93   Delora Fuel, MD  HYDROcodone-acetaminophen (NORCO/VICODIN) 5-325 MG tablet Take 1-2 tablets by mouth every 4 (four) hours as needed for moderate pain or severe pain. Patient not taking: Reported on 8/54/6270 3/50/09   Delora Fuel, MD  hydrocortisone (CORTEF) 20 MG tablet Take 0.5-1 tablets (10-20 mg total) by mouth 2 (two) times daily. Take 1 tablet every morning and 1/2 tablet every evening Patient taking differently: Take 10-20 mg by mouth See admin instructions. Take 1 tablet every morning and 1/2 tablet every evening 3/81/82   Delora Fuel, MD  levothyroxine (SYNTHROID, LEVOTHROID) 75 MCG tablet Take 1  tablet (75 mcg total) by mouth daily. 9/93/71   Delora Fuel, MD  metoprolol tartrate (LOPRESSOR) 25 MG tablet Take 0.5 tablets (12.5 mg total) by mouth 2 (two) times daily. Patient taking differently: Take 25 mg by mouth daily.  6/96/78   Delora Fuel, MD  omeprazole (PRILOSEC) 20 MG capsule Take 1 capsule (20 mg total) by mouth 2 (two) times daily. 9/38/10   Delora Fuel, MD  ondansetron (ZOFRAN) 4 MG tablet Take 1 tablet (4 mg total) by mouth every 6 (six) hours. Patient not taking: Reported on 04/27/2017 03/06/17   Montine Circle, PA-C  orphenadrine (NORFLEX) 100 MG tablet Take 1 tablet (100 mg total) by mouth 2 (two) times daily. Patient not taking: Reported on 1/75/1025 8/52/77   Delora Fuel, MD  polyethylene glycol powder [facility]) powder Take 17 g by mouth 2 (two) times daily. Until daily soft stools  OTC 03/06/17   Montine Circle, PA-C  predniSONE (DELTASONE) 20 MG tablet Take 2 tablets (40 mg total) by mouth daily with breakfast. Patient not taking: Reported on 03/06/2017 09/07/14   Clayton Bibles, PA-C  venlafaxine XR (EFFEXOR-XR) 150 MG 24 hr capsule Take 1 capsule (150 mg total) by mouth daily. 05/04/22   Delora Fuel, MD    Family History Family History  Problem Relation Age of Onset  . Colon cancer Sister 57  . Acromegaly Father   . Heart disease Father        Tumor in the heart  . Stomach cancer Father   . Diabetes Sister   . Kidney disease Sister        x2  . Heart disease Sister        Tumor in the heart  . Heart disease Daughter        Tumor in the heart  . Colon polyps Neg Hx   . Esophageal cancer Neg Hx   . Gallbladder disease Neg Hx     Social History Social History   Tobacco Use  . Smoking status: Never Smoker  . Smokeless tobacco: Never Used  Substance Use Topics  . Alcohol use: No  . Drug use: No     Allergies   Aspirin; Ibuprofen [ibuprofen]; and Penicillins   Review of Systems Review of Systems  Gastrointestinal: Positive for  abdominal pain, diarrhea, nausea and vomiting.  All other systems reviewed and are negative.    Physical Exam Updated Vital Signs BP (!) 104/53   Pulse 88   Temp 98.1 F (36.7 C) (Oral)   Resp 18   Ht 4\' 11"  (1.499 m)   Wt 77.1 kg (170 lb)   SpO2 95%   BMI 34.34 kg/m   Physical Exam  Constitutional: She is oriented to person, place, and time. She appears well-developed and well-nourished.  HENT:  Head: Normocephalic and atraumatic.  Mouth/Throat: Oropharynx is clear and moist.  Mucous membranes dry  Eyes: Conjunctivae and EOM are normal. Pupils are equal, round, and reactive to light.  Neck: Normal range of motion.  Cardiovascular: Normal rate, regular rhythm and normal heart sounds.  Pulmonary/Chest: Effort normal and breath sounds normal.  Abdominal: Soft. Bowel sounds are normal. There is no tenderness. There is no rigidity and no guarding.  Soft, normal bowel sounds, no peritoneal signs, endorses cramping pain but no focal tenderness  Musculoskeletal: Normal range of motion.  Neurological: She is alert and oriented to person, place, and time.  Skin: Skin is warm and dry.  Psychiatric: She has a normal mood and affect.  Nursing note and vitals reviewed.    ED Treatments / Results  Labs (all labs ordered are listed, but only abnormal results are displayed) Labs Reviewed  COMPREHENSIVE METABOLIC PANEL - Abnormal; Notable for the following components:      Result Value   Sodium 132 (*)    CO2 20 (*)    Glucose, Bld 113 (*)    Creatinine, Ser 1.29 (*)    GFR calc non Af Amer 45 (*)    GFR calc Af Amer 52 (*)    All other components within normal limits  LIPASE, BLOOD  CBC  URINALYSIS, ROUTINE W REFLEX MICROSCOPIC  I-STAT BETA HCG BLOOD, ED ([facility], [facility], [facility] ONLY)    EKG  EKG Interpretation  Date/Time:  Saturday August 11 2017 00:25:48 EST Ventricular Rate:  86 PR Interval:    QRS Duration: 98 QT Interval:  419 QTC Calculation: 502 R Axis:   69 Text  Interpretation:  Sinus rhythm Normal sinus rhythm Confirmed by Thomasene Lot, [place] [id number]) on 08/11/2017 12:29:19 AM       Radiology Dg Abd Acute W/chest  Result Date: 08/11/2017 CLINICAL DATA:  Nausea vomiting and diarrhea EXAM: DG ABDOMEN ACUTE W/ 1V CHEST COMPARISON:  04/27/2017 FINDINGS: Single-view chest demonstrates sternotomy changes and disconnected pacing leads. These are noted on the prior exam. Borderline cardiomegaly. No infiltrate or edema. Supine and upright views of the abdomen demonstrate multiple surgical clips in the upper abdomen. Negative for free air beneath the diaphragm. Nonobstructed bowel-gas pattern. No abnormal calcifications. IMPRESSION: 1. Borderline cardiomegaly.  Negative for edema or infiltrate 2. Nonobstructed gas pattern Electronically Signed   By: Donavan Foil M.D.   On: 08/11/2017 01:12    Procedures Procedures (including critical care time)  Medications Ordered in ED Medications  sodium chloride 0.9 % bolus 1,000 mL (0 mLs Intravenous Stopped 08/11/17 0227)  ondansetron (ZOFRAN) injection 4 mg (4 mg Intravenous Given 08/11/17 0040)  sodium chloride 0.9 % bolus 1,000 mL (0 mLs Intravenous Stopped 08/11/17 0557)  acetaminophen (TYLENOL) tablet 650 mg (650 mg Oral Given 08/11/17 0343)  hydrocortisone (CORTEF) tablet 20 mg (20 mg Oral Given 08/11/17 0558)     Initial Impression / Assessment and Plan / ED Course  I have reviewed the triage vital signs and the nursing notes.  Pertinent labs & imaging results that were available during my care of the patient were reviewed by me and considered in my medical decision making (see chart for details).  61 y.o. female here with abdominal cramping, nausea, vomiting, and diarrhea.  Denies any sick contacts, changes in diet, or travel.  She is afebrile and nontoxic.  Blood pressure is soft but she reports this is baseline.  She has no tachycardia.  She does not appear septic.  Suspect this may  be viral etiology.  Screening  labs will be obtained as well as acute abdominal series.  Plan for IV fluids, Zofran.  Initial screening labs overall reassuring.  Acute abdominal series with nonobstructive pattern.  Abdomen remains soft and benign.  Patient's BP a little soft but patient reports she is overall feeling better.  Will give additional fluids, may be a little dehydration factoring in.  Will monitor closely.  4:44 AM Patient is nearly finished with second liter of IVF.  States she is feeling better.  BP's have improved-- now in the low 962'X systolic which appears baseline for her compared with prior values and patient confirms. She has not had any episodes of vomiting or diarrhea here.  Has been able to drink full can of gingerale without issue.  Symptoms seems viral in nature.  Patient now tells me that she has not had her cortef in about 9 days.  States her prescription lapsed.  Will be given dose here, will prescribe for 2 weeks to allow time to follow-up with PCP.  Discussed plan with patient, she acknowledged understanding and agreed with plan of care.  Return precautions given for new or worsening symptoms.   Final Clinical Impressions(s) / ED Diagnoses   Final diagnoses:  Abdominal pain, unspecified abdominal location  Nausea vomiting and diarrhea    ED Discharge Orders    None       Larene Pickett, PA-C 08/11/17 0612    Macarthur Critchley, MD 08/11/17 [phone]

## 2017-08-11 NOTE — Discharge Instructions (Signed)
Resume your cortef as scheduled.  Recommend to use zofran if needed for nausea/vomiting.  Start with gentle diet and progress back to normal as tolerated. Follow-up with your primary care doctor. Return here for any new/worsening symptoms.

## 2017-08-11 NOTE — ED Triage Notes (Signed)
Pt comes from home via [facility] for abdominal pain for the past 3-4 days with n/v/d and a fever. Vomited x 6 today and diarrhea x 5

## 2017-08-31 IMAGING — CR DG ABDOMEN ACUTE W/ 1V CHEST
5 series · 5 of 5 positions shown · non-contrast
Comparison: 01/23/2017 chest radiograph

CLINICAL DATA: Cough, vomiting, lower abdominal pain.

EXAM:
DG ABDOMEN ACUTE W/ 1V CHEST

[w abdomen decub (1 of 2)]
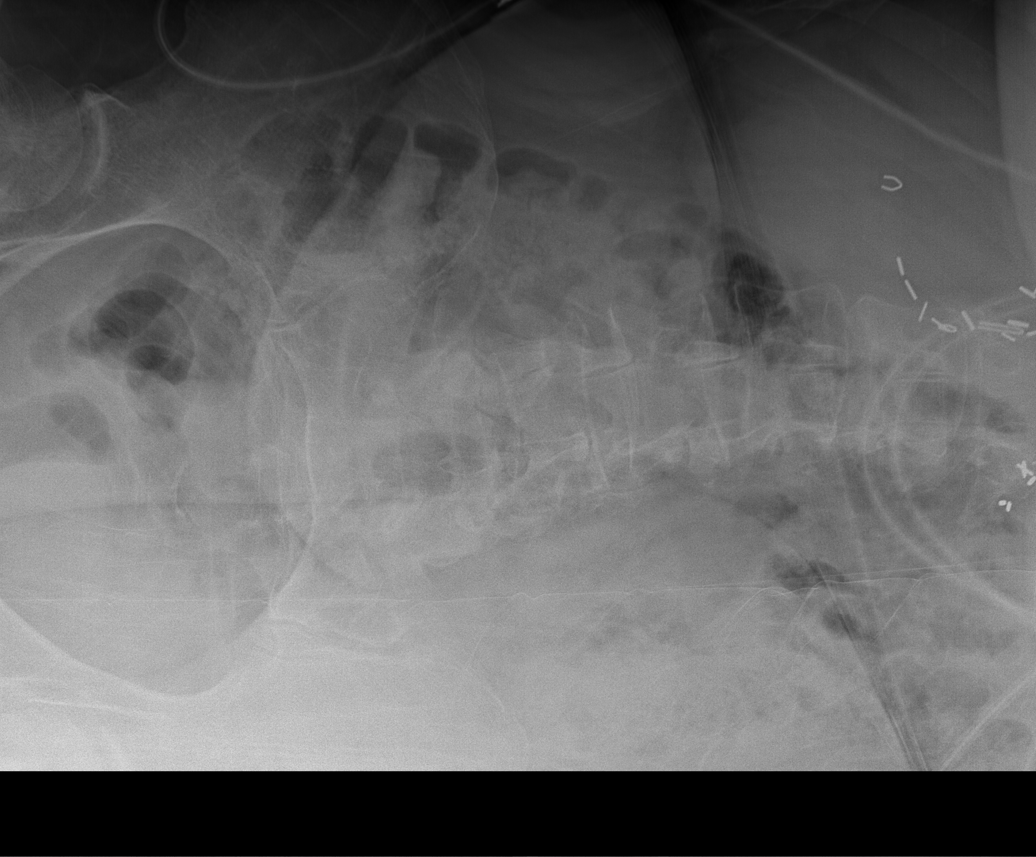

[w abdomen decub (2 of 2)]
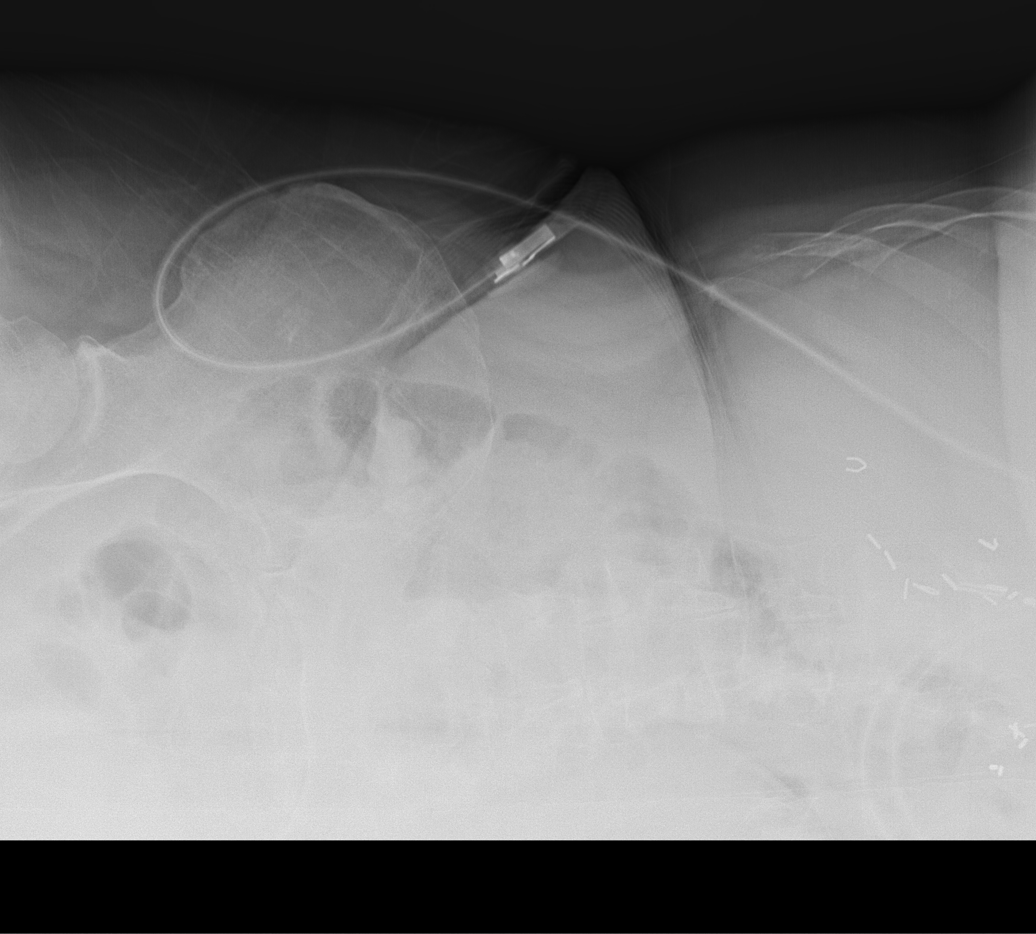

[t abdomen supine (1 of 2)]
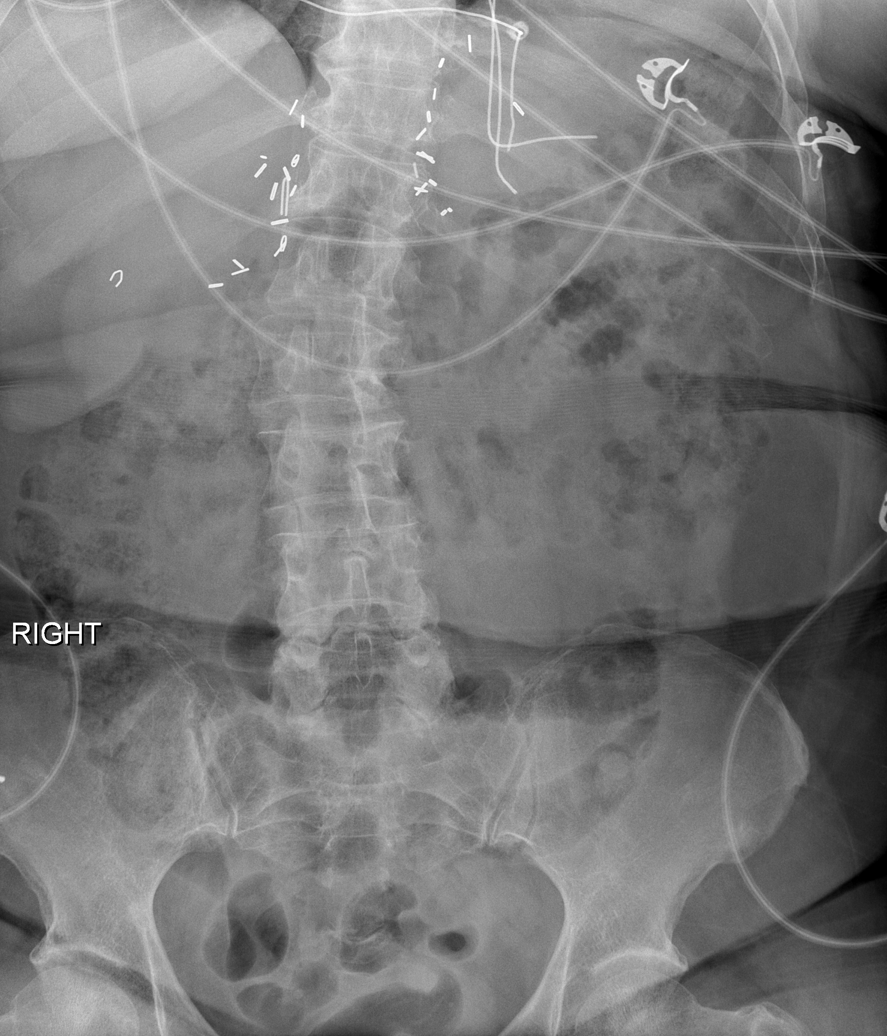

[t abdomen supine (2 of 2)]
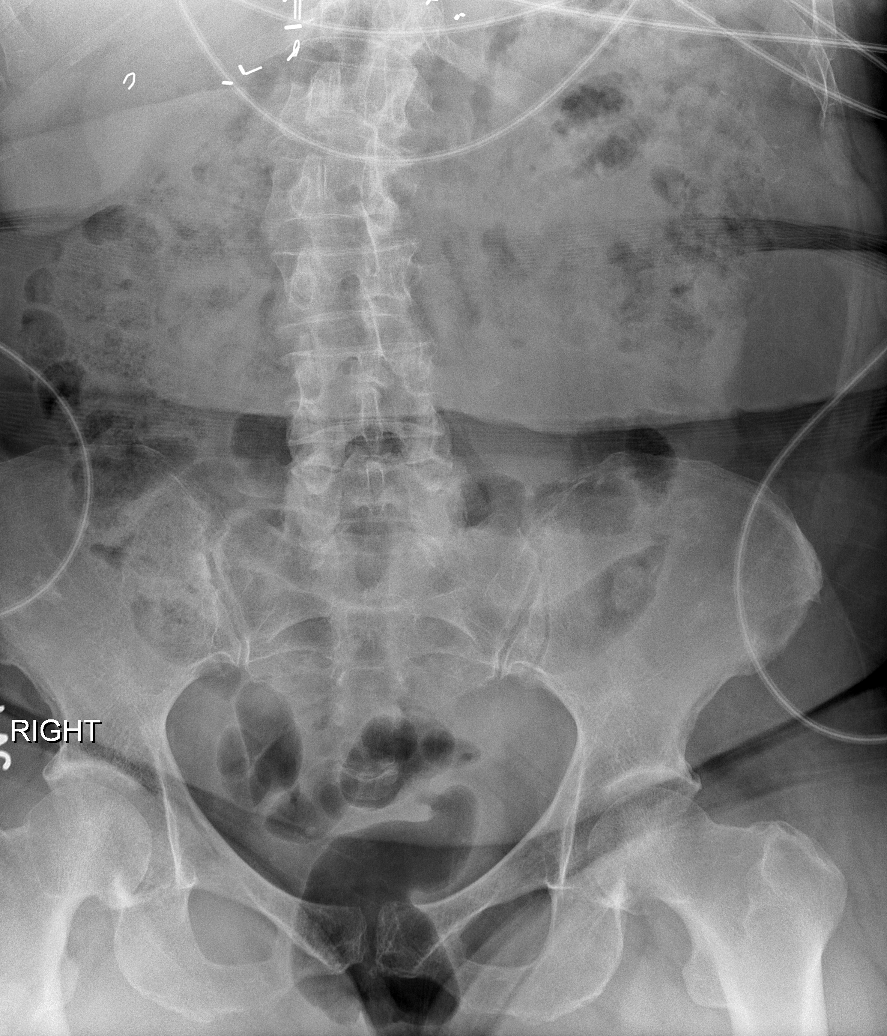

[x chest ap]
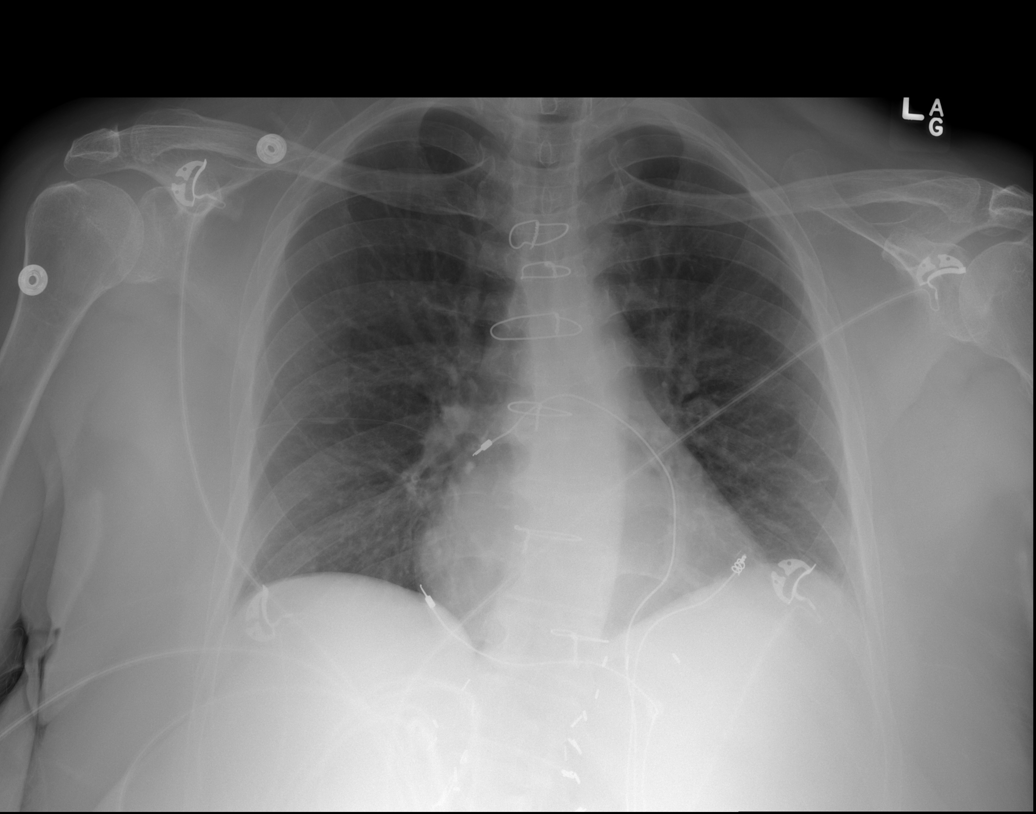

[5 of 5 positions shown; findings below may reference images not displayed]

FINDINGS: Stable normal cardiac silhouette. Clear lungs. Post median
sternotomy with wires in unchanged alignment. Mild S-shaped
curvature of the spine. Normal bowel gas pattern. No
pneumoperitoneum identified. Surgical clips project over the mid
upper abdomen.
IMPRESSION: Negative abdominal radiographs.  No acute cardiopulmonary disease.

By: Blain Jumper M.D.

## 2017-09-04 ENCOUNTER — Emergency Department (HOSPITAL_COMMUNITY)
Admission: EM | Admit: 2017-09-04 | Discharge: 2017-09-04 | Disposition: A | Discharge status: Home / Self Care | Payer: Medicaid Other | Attending: Emergency Medicine | Admitting: Emergency Medicine

## 2017-09-04 ENCOUNTER — Encounter (HOSPITAL_COMMUNITY): Payer: Self-pay

## 2017-09-04 DIAGNOSIS — R0789 Other chest pain: Secondary | ICD-10-CM | POA: Diagnosis not present

## 2017-09-04 DIAGNOSIS — E785 Hyperlipidemia, unspecified: Secondary | ICD-10-CM | POA: Diagnosis not present

## 2017-09-04 DIAGNOSIS — Z8673 Personal history of transient ischemic attack (TIA), and cerebral infarction without residual deficits: Secondary | ICD-10-CM | POA: Diagnosis not present

## 2017-09-04 DIAGNOSIS — Z79899 Other long term (current) drug therapy: Secondary | ICD-10-CM | POA: Insufficient documentation

## 2017-09-04 DIAGNOSIS — Z7982 Long term (current) use of aspirin: Secondary | ICD-10-CM | POA: Insufficient documentation

## 2017-09-04 DIAGNOSIS — E039 Hypothyroidism, unspecified: Secondary | ICD-10-CM | POA: Diagnosis not present

## 2017-09-04 DIAGNOSIS — J45909 Unspecified asthma, uncomplicated: Secondary | ICD-10-CM | POA: Insufficient documentation

## 2017-09-04 DIAGNOSIS — R079 Chest pain, unspecified: Secondary | ICD-10-CM | POA: Diagnosis present

## 2017-09-04 LAB — CBC WITH DIFFERENTIAL/PLATELET
BASOS ABS: 0 10*3/uL (ref 0.0–0.1)
Basophils Relative: 0 %
Eosinophils Absolute: 0.6 10*3/uL (ref 0.0–0.7)
Eosinophils Relative: 11 %
HEMATOCRIT: 36.1 % (ref 36.0–46.0)
HEMOGLOBIN: 11.6 g/dL — AB (ref 12.0–15.0)
LYMPHS ABS: 3.3 10*3/uL (ref 0.7–4.0)
LYMPHS PCT: 54 %
MCH: 29.3 pg (ref 26.0–34.0)
MCHC: 32.1 g/dL (ref 30.0–36.0)
MCV: 91.2 fL (ref 78.0–100.0)
Monocytes Absolute: 0.3 10*3/uL (ref 0.1–1.0)
Monocytes Relative: 6 %
NEUTROS ABS: 1.7 10*3/uL (ref 1.7–7.7)
NEUTROS PCT: 29 %
PLATELETS: 237 10*3/uL (ref 150–400)
RBC: 3.96 MIL/uL (ref 3.87–5.11)
RDW: 14.1 % (ref 11.5–15.5)
WBC: 5.9 10*3/uL (ref 4.0–10.5)

## 2017-09-04 LAB — COMPREHENSIVE METABOLIC PANEL
ALBUMIN: 3.2 g/dL — AB (ref 3.5–5.0)
ALK PHOS: 45 U/L (ref 38–126)
ALT: 10 U/L — AB (ref 14–54)
ANION GAP: 7 (ref 5–15)
AST: 18 U/L (ref 15–41)
BILIRUBIN TOTAL: 0.7 mg/dL (ref 0.3–1.2)
BUN: 11 mg/dL (ref 6–20)
CALCIUM: 8.6 mg/dL — AB (ref 8.9–10.3)
CO2: 25 mmol/L (ref 22–32)
Chloride: 108 mmol/L (ref 101–111)
Creatinine, Ser: 0.93 mg/dL (ref 0.44–1.00)
GFR calc Af Amer: >60 mL/min (ref >60)
GFR calc non Af Amer: >60 mL/min (ref >60)
GLUCOSE: 94 mg/dL (ref 65–99)
Potassium: 3.2 mmol/L — ABNORMAL LOW (ref 3.5–5.1)
SODIUM: 140 mmol/L (ref 135–145)
TOTAL PROTEIN: 5.8 g/dL — AB (ref 6.5–8.1)

## 2017-09-04 LAB — I-STAT TROPONIN, ED
Troponin i, poc: 0 ng/mL (ref 0.00–0.08)
Troponin i, poc: 0.01 ng/mL (ref 0.00–0.08)

## 2017-09-04 MED ORDER — SODIUM CHLORIDE 0.9 % IV BOLUS (SEPSIS)
1000.0000 mL | Freq: Once | INTRAVENOUS | Status: AC
  Administered 2017-09-04: 1000 mL via INTRAVENOUS

## 2017-09-04 MED ORDER — ACETAMINOPHEN 500 MG PO TABS
1000.0000 mg | ORAL_TABLET | Freq: Once | ORAL | Status: AC
  Administered 2017-09-04: 1000 mg via ORAL
  Filled 2017-09-04: qty 2

## 2017-09-04 NOTE — ED Provider Notes (Signed)
[facility] EMERGENCY DEPARTMENT Provider Note   CSN: [id number] Arrival date & time: 09/04/17  1156     History   Chief Complaint Chief Complaint  Patient presents with  . Chest Pain    HPI [name] is a 61 y.o. female who presents for cp. She has an extensive pmh.  Patient complains of pain in the right side of her chest extending into her arm.  She states it hurts when she moves her arm.  She is under significant stress because she is living in the home of some when she is taking care of.  She states that this woman had a stroke, ballooned in size and now weighs more than 300 pounds and refuses to do anything to help herself move her clean herself.  She used to be able to but she feels like her diet has "given up."  She is having to turn her and clean her and feels like she may have strained her arm when turning her.  She is left-hand dominant.  She denies numbness or tingling.  She denies any weakness in her lower extremities.  She did not take any medication prior to arrival.  She was given nitroglycerin by EMS and states she has a headache.  HPI  Past Medical History:  Diagnosis Date  . 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 Amblyopia   . Anemia   . Arthritis    "in my lower back"  . Asthma   . Atrial myxoma   . Blindness of left eye 1978  . Blood transfusion   . Breast cyst   . Constipation   . 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 CVA (cerebral infarction)   . Depression   . Depression   . 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 Hemorrhoids   . Hyperlipemia   . Hypothyroidism    per office note dated 03/25/2014  . Migraine   . Peptic ulcer associated with Helicobacter pylori infection   . Reflux   . Sinus headache   . Stomach ulcer    "from the hydrocortisone"  . Stroke [facility]) 1978   "piece tumor broke off & went to left eye; leaving me blind"  . Thyroid disease    pt unsure if hyper or hypo???    Patient Active Problem List   Diagnosis Date  Noted  . Headache 11/02/2013  . Dizziness 11/02/2013  . Anemia 01/10/2013  . Chest pain, unspecified 01/10/2013  . Adjustment disorder with mixed anxiety and depressed mood 01/10/2013  . Adrenal crisis ([place]) 01/07/2013  . Nausea & vomiting 08/03/2011  . Hypotension 08/03/2011  . Adrenal insufficiency ([place]) 08/03/2011  . Hyponatremia 08/03/2011  . Hypothyroidism 08/03/2011  . Depression 08/03/2011    Past Surgical History:  Procedure Laterality Date  . ADRENAL GLAND SURGERY  1982   on hydrocortisone  . ATRIAL MYXOMA EXCISION  1978  . BREAST BIOPSY  07/1999   ductal tissue excision & bx right; bx only left breast  . EYE SURGERY    . LEFT HEART CATHETERIZATION WITH CORONARY ANGIOGRAM N/A 10/06/2011   Procedure: LEFT HEART CATHETERIZATION WITH CORONARY ANGIOGRAM;  Surgeon: Clent Demark, MD;  Location: [facility] CATH LAB;  Service: Cardiovascular;  Laterality: N/A;  . repaired cross eyed     "as a child"  . ROTATOR CUFF REPAIR  05/2005   left  . VAGINAL HYSTERECTOMY  2000   "partial; w/left ovary"    OB History    No data available  Home Medications    Prior to Admission medications   Medication Sig Start Date End Date Taking? Authorizing Provider  aspirin EC 81 MG tablet Take 81 mg by mouth every morning.   Yes [provider]  hydrocortisone (CORTEF) 20 MG tablet Take 1 tablet in the morning, 1/2 tablet in the evening. 08/11/17  Yes Larene Pickett, PA-C  levothyroxine (SYNTHROID, LEVOTHROID) 75 MCG tablet Take 1 tablet (75 mcg total) by mouth daily. 5/32/99  Yes Delora Fuel, MD  metoprolol tartrate (LOPRESSOR) 25 MG tablet Take 0.5 tablets (12.5 mg total) by mouth 2 (two) times daily. Patient taking differently: Take 25 mg by mouth daily.  2/42/68  Yes Delora Fuel, MD  omeprazole (PRILOSEC) 20 MG capsule Take 1 capsule (20 mg total) by mouth 2 (two) times daily. 3/41/96  Yes Delora Fuel, MD  ondansetron (ZOFRAN ODT) 4 MG disintegrating tablet Take 1 tablet (4  mg total) by mouth every 8 (eight) hours as needed for nausea. 08/11/17  Yes Larene Pickett, PA-C  polyethylene glycol powder (GLYCOLAX/MIRALAX) powder Take 17 g by mouth 2 (two) times daily. Until daily soft stools  OTC 03/06/17  Yes Montine Circle, PA-C  venlafaxine XR (EFFEXOR-XR) 150 MG 24 hr capsule Take 1 capsule (150 mg total) by mouth daily. 11/03/95  Yes Delora Fuel, MD  gabapentin (NEURONTIN) 300 MG capsule Take 1 capsule (300 mg total) by mouth 2 (two) times daily. Patient not taking: Reported on 9/89/2119 12/27/38   Delora Fuel, MD  HYDROcodone-acetaminophen (NORCO/VICODIN) 5-325 MG tablet Take 1-2 tablets by mouth every 4 (four) hours as needed for moderate pain or severe pain. Patient not taking: Reported on 04/24/4817 5/63/14   Delora Fuel, MD  hydrocortisone (CORTEF) 20 MG tablet Take 0.5-1 tablets (10-20 mg total) by mouth 2 (two) times daily. Take 1 tablet every morning and 1/2 tablet every evening Patient taking differently: Take 10-20 mg by mouth See admin instructions. Take 1 tablet every morning and 1/2 tablet every evening 9/70/26   Delora Fuel, MD  ondansetron (ZOFRAN) 4 MG tablet Take 1 tablet (4 mg total) by mouth every 6 (six) hours. Patient not taking: Reported on 04/27/2017 03/06/17   Montine Circle, PA-C    Family History Family History  Problem Relation Age of Onset  . Colon cancer Sister 44  . Acromegaly Father   . Heart disease Father        Tumor in the heart  . Stomach cancer Father   . Diabetes Sister   . Kidney disease Sister        x2  . Heart disease Sister        Tumor in the heart  . Heart disease Daughter        Tumor in the heart  . Colon polyps Neg Hx   . Esophageal cancer Neg Hx   . Gallbladder disease Neg Hx     Social History Social History   Tobacco Use  . Smoking status: Never Smoker  . Smokeless tobacco: Never Used  Substance Use Topics  . Alcohol use: No  . Drug use: No     Allergies   Aspirin; Ibuprofen [ibuprofen];  and Penicillins   Review of Systems Review of Systems Ten systems reviewed and are negative for acute change, except as noted in the HPI.    Physical Exam Updated Vital Signs BP (!) 102/57 (BP Location: Right Arm)   Pulse 67   Temp 98.5 F (36.9 C) (Oral)   Resp 15  Ht 4\' 11"  (1.499 m)   Wt 74.8 kg (165 lb)   SpO2 100%   BMI 33.33 kg/m   Physical Exam  Constitutional: She is oriented to person, place, and time. She appears well-developed and well-nourished. No distress.  HENT:  Head: Normocephalic and atraumatic.  Eyes: Conjunctivae are normal. No scleral icterus.  Neck: Normal range of motion.  Cardiovascular: Normal rate, regular rhythm and normal heart sounds. Exam reveals no gallop and no friction rub.  No murmur heard. Pulmonary/Chest: Effort normal and breath sounds normal. No respiratory distress. She exhibits tenderness.  Patient with tenderness to palpation of the right chest wall.  Pain is reproducible with palpation and movement of the right arm.  Abdominal: Soft. Bowel sounds are normal. She exhibits no distension and no mass. There is no tenderness. There is no guarding.  Neurological: She is alert and oriented to person, place, and time.  Skin: Skin is warm and dry. She is not diaphoretic.  Psychiatric: Her behavior is normal.  Nursing note and vitals reviewed.    ED Treatments / Results  Labs (all labs ordered are listed, but only abnormal results are displayed) Labs Reviewed  CBC WITH DIFFERENTIAL/PLATELET - Abnormal; Notable for the following components:      Result Value   Hemoglobin 11.6 (*)    All other components within normal limits  COMPREHENSIVE METABOLIC PANEL - Abnormal; Notable for the following components:   Potassium 3.2 (*)    Calcium 8.6 (*)    Total Protein 5.8 (*)    Albumin 3.2 (*)    ALT 10 (*)    All other components within normal limits  I-STAT TROPONIN, ED  I-STAT TROPONIN, ED    EKG  EKG  Interpretation  Date/Time:  Tuesday September 04 2017 12:03:06 EST Ventricular Rate:  61 PR Interval:    QRS Duration: 94 QT Interval:  416 QTC Calculation: 419 R Axis:   62 Text Interpretation:  Sinus rhythm Ventricular trigeminy Borderline T abnormalities, anterior leads Since last tracing rate slower Confirmed by Orlie Dakin [id number]) on 09/04/2017 3:18:55 PM       Radiology No results found.  Procedures Procedures (including critical care time)  Medications Ordered in ED Medications  sodium chloride 0.9 % bolus 1,000 mL (1,000 mLs Intravenous New Bag/Given 09/04/17 1421)  acetaminophen (TYLENOL) tablet 1,000 mg (1,000 mg Oral Given 09/04/17 1420)     Initial Impression / Assessment and Plan / ED Course  I have reviewed the triage vital signs and the nursing notes.  Pertinent labs & imaging results that were available during my care of the patient were reviewed by me and considered in my medical decision making (see chart for details).     Patient with ventricular trigeminy.  This appears to be new however I do not feel that her symptoms today are representative of ACS. She has 2- troponins here in the emergency department where she is low risk for major adverse cardiac events. Patient will be discharged . Encouraged to use tylenol. Patient seen in shared visit with attending physician. Who agrees with assessment, work up , treatment, and plan for discharge    Final Clinical Impressions(s) / ED Diagnoses   Final diagnoses:  Chest wall pain    ED Discharge Orders    None       Margarita Mail, PA-C 09/04/17 1621    Orlie Dakin, MD 09/04/17 [phone]

## 2017-09-04 NOTE — ED Provider Notes (Signed)
As of right-sided chest pain onset this morning.  She denies any shortness of breath.  She denies cough.  Pain is worse with changing positions or abducting her right shoulder improved with remaining still.  Pain is nonexertional.  On exam she is alert nontoxic lungs clear to auscultation heart regular rate and rhythm no murmurs chest is tender anteriorly at right side.  Pain is easily reproduced by forcible abduction ofright shoulder   Orlie Dakin, MD 09/04/17 1524

## 2017-09-04 NOTE — Discharge Instructions (Signed)
Take tylenol for pain  You have been diagnosed by your caregiver as having chest wall pain. SEEK IMMEDIATE MEDICAL ATTENTION IF: You develop a fever.  Your chest pains become severe or intolerable.  You develop new, unexplained symptoms (problems).  You develop shortness of breath, nausea, vomiting, sweating or feel light headed.  You develop a new cough or you cough up blood.

## 2017-09-04 NOTE — ED Notes (Signed)
Patient verbalizes understanding of discharge instructions. Opportunity for questioning and answers were provided. Armband removed by staff, pt discharged from ED via wheelchair.  

## 2017-09-04 NOTE — ED Triage Notes (Signed)
Pt arrives to ED from home via EMS with complaints of CP since 0500 this morning. EMS reports pt was given 1 Sl nitro and Cp decreased from 9 to 7/10. Pt placed in position of comfort with bed locked and lowered, call bell in reach.

## 2017-11-21 ENCOUNTER — Other Ambulatory Visit: Payer: Self-pay | Admitting: Cardiology

## 2017-11-21 DIAGNOSIS — R079 Chest pain, unspecified: Secondary | ICD-10-CM

## 2017-11-23 ENCOUNTER — Encounter (HOSPITAL_COMMUNITY)
Admission: RE | Admit: 2017-11-23 | Discharge: 2017-11-23 | Disposition: A | Discharge status: Home / Self Care | Payer: Medicaid Other | Source: Ambulatory Visit | Attending: Cardiology | Admitting: Cardiology

## 2017-11-23 DIAGNOSIS — R079 Chest pain, unspecified: Secondary | ICD-10-CM | POA: Insufficient documentation

## 2017-11-23 LAB — BASIC METABOLIC PANEL
Anion gap: 9 (ref 5–15)
BUN: 16 mg/dL (ref 6–20)
CO2: 24 mmol/L (ref 22–32)
Calcium: 9.4 mg/dL (ref 8.9–10.3)
Chloride: 105 mmol/L (ref 101–111)
Creatinine, Ser: 0.85 mg/dL (ref 0.44–1.00)
GFR calc Af Amer: >60 mL/min (ref >60)
GLUCOSE: 102 mg/dL — AB (ref 65–99)
Potassium: 3.9 mmol/L (ref 3.5–5.1)
Sodium: 138 mmol/L (ref 135–145)

## 2017-11-23 LAB — LIPID PANEL
Cholesterol: 165 mg/dL (ref 0–200)
HDL: 50 mg/dL (ref >40)
LDL Cholesterol: 92 mg/dL (ref 0–99)
TRIGLYCERIDES: 116 mg/dL (ref <150)
Total CHOL/HDL Ratio: 3.3 RATIO
VLDL: 23 mg/dL (ref 0–40)

## 2017-11-23 LAB — HEPATIC FUNCTION PANEL
ALT: 13 U/L — ABNORMAL LOW (ref 14–54)
AST: 20 U/L (ref 15–41)
Albumin: 3.8 g/dL (ref 3.5–5.0)
Alkaline Phosphatase: 63 U/L (ref 38–126)
Total Bilirubin: 0.5 mg/dL (ref 0.3–1.2)
Total Protein: 7.3 g/dL (ref 6.5–8.1)

## 2017-11-23 LAB — TSH: TSH: 0.979 u[IU]/mL (ref 0.350–4.500)

## 2017-11-23 MED ORDER — SODIUM CHLORIDE 0.9 % IV SOLN: Freq: Once | INTRAVENOUS | Status: DC

## 2017-11-23 MED ORDER — TECHNETIUM TC 99M TETROFOSMIN IV KIT
30.0000 | PACK | Freq: Once | INTRAVENOUS | Status: AC | PRN
  Administered 2017-11-23: 30 via INTRAVENOUS

## 2017-11-23 MED ORDER — REGADENOSON 0.4 MG/5ML IV SOLN
INTRAVENOUS | Status: AC
  Filled 2017-11-23: qty 5

## 2017-11-23 MED ORDER — TECHNETIUM TC 99M TETROFOSMIN IV KIT
10.0000 | PACK | Freq: Once | INTRAVENOUS | Status: AC | PRN
  Administered 2017-11-23: 10 via INTRAVENOUS

## 2017-11-23 MED ORDER — REGADENOSON 0.4 MG/5ML IV SOLN
0.4000 mg | Freq: Once | INTRAVENOUS | Status: AC
  Administered 2017-11-23: 0.4 mg via INTRAVENOUS

## 2018-03-11 DIAGNOSIS — I10 Essential (primary) hypertension: Secondary | ICD-10-CM | POA: Diagnosis not present

## 2018-03-11 DIAGNOSIS — E039 Hypothyroidism, unspecified: Secondary | ICD-10-CM | POA: Diagnosis not present

## 2018-03-11 DIAGNOSIS — K219 Gastro-esophageal reflux disease without esophagitis: Secondary | ICD-10-CM | POA: Diagnosis not present

## 2018-03-11 DIAGNOSIS — I208 Other forms of angina pectoris: Secondary | ICD-10-CM | POA: Diagnosis not present

## 2018-03-11 DIAGNOSIS — E785 Hyperlipidemia, unspecified: Secondary | ICD-10-CM | POA: Diagnosis not present

## 2018-03-11 DIAGNOSIS — G43809 Other migraine, not intractable, without status migrainosus: Secondary | ICD-10-CM | POA: Diagnosis not present

## 2018-06-20 DIAGNOSIS — K279 Peptic ulcer, site unspecified, unspecified as acute or chronic, without hemorrhage or perforation: Secondary | ICD-10-CM | POA: Diagnosis not present

## 2018-06-20 DIAGNOSIS — G43909 Migraine, unspecified, not intractable, without status migrainosus: Secondary | ICD-10-CM | POA: Diagnosis not present

## 2018-06-20 DIAGNOSIS — E785 Hyperlipidemia, unspecified: Secondary | ICD-10-CM | POA: Diagnosis not present

## 2018-06-20 DIAGNOSIS — K219 Gastro-esophageal reflux disease without esophagitis: Secondary | ICD-10-CM | POA: Diagnosis not present

## 2018-06-20 DIAGNOSIS — E039 Hypothyroidism, unspecified: Secondary | ICD-10-CM | POA: Diagnosis not present

## 2018-06-20 DIAGNOSIS — I10 Essential (primary) hypertension: Secondary | ICD-10-CM | POA: Diagnosis not present

## 2018-06-20 DIAGNOSIS — I208 Other forms of angina pectoris: Secondary | ICD-10-CM | POA: Diagnosis not present

## 2018-09-19 DIAGNOSIS — E785 Hyperlipidemia, unspecified: Secondary | ICD-10-CM | POA: Diagnosis not present

## 2018-09-19 DIAGNOSIS — K219 Gastro-esophageal reflux disease without esophagitis: Secondary | ICD-10-CM | POA: Diagnosis not present

## 2018-09-19 DIAGNOSIS — I208 Other forms of angina pectoris: Secondary | ICD-10-CM | POA: Diagnosis not present

## 2018-09-19 DIAGNOSIS — I10 Essential (primary) hypertension: Secondary | ICD-10-CM | POA: Diagnosis not present

## 2018-09-19 DIAGNOSIS — G43909 Migraine, unspecified, not intractable, without status migrainosus: Secondary | ICD-10-CM | POA: Diagnosis not present

## 2018-09-19 DIAGNOSIS — E039 Hypothyroidism, unspecified: Secondary | ICD-10-CM | POA: Diagnosis not present

## 2018-09-27 ENCOUNTER — Ambulatory Visit (INDEPENDENT_AMBULATORY_CARE_PROVIDER_SITE_OTHER): Payer: Medicaid Other | Admitting: Family Medicine

## 2018-09-27 ENCOUNTER — Encounter: Payer: Self-pay | Admitting: Family Medicine

## 2018-09-27 VITALS — BP 110/77 | HR 70 | Temp 98.4°F | Resp 16 | Ht 59.0 in | Wt 208.0 lb

## 2018-09-27 DIAGNOSIS — K219 Gastro-esophageal reflux disease without esophagitis: Secondary | ICD-10-CM | POA: Diagnosis not present

## 2018-09-27 DIAGNOSIS — F4323 Adjustment disorder with mixed anxiety and depressed mood: Secondary | ICD-10-CM | POA: Diagnosis not present

## 2018-09-27 DIAGNOSIS — E2740 Unspecified adrenocortical insufficiency: Secondary | ICD-10-CM

## 2018-09-27 DIAGNOSIS — F329 Major depressive disorder, single episode, unspecified: Secondary | ICD-10-CM

## 2018-09-27 DIAGNOSIS — I10 Essential (primary) hypertension: Secondary | ICD-10-CM | POA: Diagnosis not present

## 2018-09-27 DIAGNOSIS — E039 Hypothyroidism, unspecified: Secondary | ICD-10-CM

## 2018-09-27 DIAGNOSIS — Z23 Encounter for immunization: Secondary | ICD-10-CM

## 2018-09-27 DIAGNOSIS — G4700 Insomnia, unspecified: Secondary | ICD-10-CM | POA: Diagnosis not present

## 2018-09-27 DIAGNOSIS — F32A Depression, unspecified: Secondary | ICD-10-CM

## 2018-09-27 DIAGNOSIS — E781 Pure hyperglyceridemia: Secondary | ICD-10-CM

## 2018-09-27 LAB — POCT URINALYSIS DIPSTICK
Bilirubin, UA: NEGATIVE
Glucose, UA: NEGATIVE
Ketones, UA: NEGATIVE
Nitrite, UA: NEGATIVE
Protein, UA: NEGATIVE
Spec Grav, UA: 1.025 (ref 1.010–1.025)
Urobilinogen, UA: 1 E.U./dL
pH, UA: 5.5 (ref 5.0–8.0)

## 2018-09-27 MED ORDER — TRAZODONE HCL 50 MG PO TABS: 25.0000 mg | ORAL_TABLET | Freq: Every evening | ORAL | 2 refills | Status: DC | PRN

## 2018-09-27 MED ORDER — OMEPRAZOLE 20 MG PO CPDR: 20.0000 mg | DELAYED_RELEASE_CAPSULE | Freq: Every day | ORAL | 2 refills | Status: DC

## 2018-09-27 MED ORDER — VENLAFAXINE HCL ER 150 MG PO CP24: 150.0000 mg | ORAL_CAPSULE | Freq: Every day | ORAL | 2 refills | Status: DC

## 2018-09-27 NOTE — Patient Instructions (Addendum)
You will need to go to [facility] for mental health. The address is below.   [place]  [phone] Open  Closes 5 PM   Cushing's Syndrome  Cushing's syndrome is a condition in which the body produces too much cortisol. Cortisol is a chemical messenger (hormone) made by the two glands at the top of the kidneys (adrenal glands). The adrenal glands make cortisol when a small organ in the brain (pituitary gland) releases a chemical (adrenocorticotropic hormone or ACTH) that stimulates the adrenal glands. Cortisol is sometimes called "the stress hormone" because the adrenal glands release it during times of stress. It also regulates how the body fights disease (immune system) and how the body uses energy. What are the causes? The most common cause of Cushing's syndrome is taking anti-inflammatory medicines (steroids) for a long time. Steroids work in the same way as cortisol in the body. Other causes include tumors in different parts of the body, such as:  Adrenal glands.  Pituitary gland.  Lungs.  Pancreas.  Thyroid.  Thymus. The tumors that cause Cushing's syndrome are usually benign. This means that they are not cancerous. What increases the risk? The following factors make you more likely to develop this condition:  Being a woman. Women are more likely to develop pituitary or adrenal gland tumors.  Being 19-31 years old. What are the signs or symptoms? Symptoms of this condition vary widely. There are a lot of possible symptoms of Cushing's syndrome because cortisol affects so many parts of the body. Some people have only mild symptoms, and others may have very severe symptoms. The most common symptoms are:  Weight gain.  High blood pressure.  Headache.  Memory loss.  Irritability.  Round face (moon face).  Fat around the neck, especially in back (buffalo hump).  Fatigue.  Menstrual changes in  women. Other symptoms include:  Skin changes, such as acne, bruising, infections, or stretch marks.  Abnormal facial hair growth in women (hirsutism).  Loss of sexual function in men (impotence).  Muscle weakness.  Difficulty falling asleep or staying asleep (insomnia).  Depression.  Broken bones due to bone weakness (osteopenia). How is this diagnosed? Cushing's syndrome is hard to diagnose because many conditions cause similar symptoms. This condition may be diagnosed based on:  Your medical history and a physical exam.  Blood tests.  Urine tests. Your health care provider may measure the amount of cortisol in your urine over 24 hours.  Saliva tests.  Giving you a medicine similar to cortisol (dexamethasone suppression test) to see whether your natural cortisol level decreases. If you are diagnosed with Cushing's syndrome, your health care provider may do more tests to find the cause. How is this treated? Treatment for Cushing's syndrome depends on the cause.  If steroid medicines are the cause, your health care provider will gradually reduce your dosage.  If a tumor is the cause, treatment may include: ? Surgery to remove the tumor. ? X-ray treatment (radiation therapy) to destroy a tumor that cannot be completely removed. ? Medicine to stop tumor growth and decrease the amount of ACTH produced by the pituitary gland. ? Medicines to block the effects of cortisol. ? Surgery to remove the adrenal glands. This is rare. If your treatment affected your pituitary gland, you may need to take hormones (hormone replacement therapy) after treatment. Follow these instructions at home: Medicines  Take over-the-counter and prescription medicines only as told by your health  care provider.  Take hormone replacement medicines daily as told by your health care provider. It is important to not skip a dose of this medicine. Lifestyle      Because this condition can make your bones  weak, take steps to prevent bone loss or damage as told by your health care provider. This may include: ? Eating a healthy diet. Eat foods that have calcium and vitamin D, such as dairy products with calcium and vitamin D added (fortified dairy products). ? Get regular exercise as told by your health care provider. ? Do not do activities that involve a lot of running or jumping (high-impact activities). Ask your health care provider what activities are safe for you. ? Do not use any products that contain nicotine or tobacco, such as cigarettes and e-cigarettes. If you need help quitting, ask your health care provider. Alcohol Use  Do not drink alcohol if: ? Your health care provider tells you not to drink. ? You are pregnant, may be pregnant, or are planning to become pregnant.  If you drink alcohol, limit how much you have: ? 0-1 drink a day for women. ? 0-2 drinks a day for men.  Be aware of how much alcohol is in your drink. In the [place] one drink equals one 12 oz bottle of beer (355 mL), one 5 oz glass of wine (148 mL), or one 1 oz glass of hard liquor (44 mL). General instructions  If your adrenal glands were removed, talk with your health care provider about how to manage symptoms when you are sick or under stress. You may need to take additional medicines during this time.  Keep all follow-up visits as told by your health care provider. This is important. Contact a health care provider if you:  Have a fever or chills.  Are struggling to manage your symptoms. Get help right away if you:  Feel very light-headed and weak.  Have severe abdominal pain.  Have sudden changes in your vision.  Become very confused.  Have trouble breathing.  Have chest pain. Summary  Cushing's syndrome is a condition in which the body produces too much cortisol, a hormone that regulates how the body fights disease and how it uses energy.  The most common cause of this condition is long-term  use of steroids, as well as tumors in the adrenal glands.  Cushing's syndrome is treated by gradually stopping the use of steroids. Because this condition can make your bones weak, work with your health care provider to take steps to prevent bone loss or damage.  If you have Cushing's syndrome, you should get help right away if you feel light-headed, have severe pain in your abdomen, or have sudden changes in your vision. This information is not intended to replace advice given to you by your health care provider. Make sure you discuss any questions you have with your health care provider. Document Released: 08/18/2002 Document Revised: 12/05/2017 Document Reviewed: 12/05/2017 Elsevier Interactive Patient Education  2019 [employer].  Hypertension Hypertension is another name for high blood pressure. High blood pressure forces your heart to work harder to pump blood. This can cause problems over time. There are two numbers in a blood pressure reading. There is a top number (systolic) over a bottom number (diastolic). It is best to have a blood pressure below 120/80. Healthy choices can help lower your blood pressure. You may need medicine to help lower your blood pressure if:  Your blood pressure cannot be lowered with  healthy choices.  Your blood pressure is higher than 130/80. Follow these instructions at home: Eating and drinking   If directed, follow the DASH eating plan. This diet includes: ? Filling half of your plate at each meal with fruits and vegetables. ? Filling one quarter of your plate at each meal with whole grains. Whole grains include whole wheat pasta, brown rice, and whole grain bread. ? Eating or drinking low-fat dairy products, such as skim milk or low-fat yogurt. ? Filling one quarter of your plate at each meal with low-fat (lean) proteins. Low-fat proteins include fish, skinless chicken, eggs, beans, and tofu. ? Avoiding fatty meat, cured and processed meat, or  chicken with skin. ? Avoiding premade or processed food.  Eat less than 1,500 mg of salt (sodium) a day.  Limit alcohol use to no more than 1 drink a day for nonpregnant women and 2 drinks a day for men. One drink equals 12 oz of beer, 5 oz of wine, or 1 oz of hard liquor. Lifestyle  Work with your doctor to stay at a healthy weight or to lose weight. Ask your doctor what the best weight is for you.  Get at least 30 minutes of exercise that causes your heart to beat faster (aerobic exercise) most days of the week. This may include walking, swimming, or biking.  Get at least 30 minutes of exercise that strengthens your muscles (resistance exercise) at least 3 days a week. This may include lifting weights or pilates.  Do not use any products that contain nicotine or tobacco. This includes cigarettes and e-cigarettes. If you need help quitting, ask your doctor.  Check your blood pressure at home as told by your doctor.  Keep all follow-up visits as told by your doctor. This is important. Medicines  Take over-the-counter and prescription medicines only as told by your doctor. Follow directions carefully.  Do not skip doses of blood pressure medicine. The medicine does not work as well if you skip doses. Skipping doses also puts you at risk for problems.  Ask your doctor about side effects or reactions to medicines that you should watch for. Contact a doctor if:  You think you are having a reaction to the medicine you are taking.  You have headaches that keep coming back (recurring).  You feel dizzy.  You have swelling in your ankles.  You have trouble with your vision. Get help right away if:  You get a very bad headache.  You start to feel confused.  You feel weak or numb.  You feel faint.  You get very bad pain in your: ? Chest. ? Belly (abdomen).  You throw up (vomit) more than once.  You have trouble breathing. Summary  Hypertension is another name for high  blood pressure.  Making healthy choices can help lower blood pressure. If your blood pressure cannot be controlled with healthy choices, you may need to take medicine. This information is not intended to replace advice given to you by your health care provider. Make sure you discuss any questions you have with your health care provider. Document Released: 02/14/2008 Document Revised: 07/26/2016 Document Reviewed: 07/26/2016 Elsevier Interactive Patient Education  2019 [place] for Gastroesophageal Reflux Disease, Adult When you have gastroesophageal reflux disease (GERD), the foods you eat and your eating habits are very important. Choosing the right foods can help ease your discomfort. Think about working with a nutrition specialist (dietitian) to help you make good choices. What are tips  for following this plan?  Meals  Choose healthy foods that are low in fat, such as fruits, vegetables, whole grains, low-fat dairy products, and lean meat, fish, and poultry.  Eat small meals often instead of 3 large meals a day. Eat your meals slowly, and in a place where you are relaxed. Avoid bending over or lying down until 2-3 hours after eating.  Avoid eating meals 2-3 hours before bed.  Avoid drinking a lot of liquid with meals.  Cook foods using methods other than frying. Bake, grill, or broil food instead.  Avoid or limit: ? Chocolate. ? Peppermint or spearmint. ? Alcohol. ? Pepper. ? Black and decaffeinated coffee. ? Black and decaffeinated tea. ? Bubbly (carbonated) soft drinks. ? Caffeinated energy drinks and soft drinks.  Limit high-fat foods such as: ? Fatty meat or fried foods. ? Whole milk, cream, butter, or ice cream. ? Nuts and nut butters. ? Pastries, donuts, and sweets made with butter or shortening.  Avoid foods that cause symptoms. These foods may be different for everyone. Common foods that cause symptoms include: ? Tomatoes. ? Oranges, lemons, and  limes. ? Peppers. ? Spicy food. ? Onions and garlic. ? Vinegar. Lifestyle  Maintain a healthy weight. Ask your doctor what weight is healthy for you. If you need to lose weight, work with your doctor to do so safely.  Exercise for at least 30 minutes for 5 or more days each week, or as told by your doctor.  Wear loose-fitting clothes.  Do not smoke. If you need help quitting, ask your doctor.  Sleep with the head of your bed higher than your feet. Use a wedge under the mattress or blocks under the bed frame to raise the head of the bed. Summary  When you have gastroesophageal reflux disease (GERD), food and lifestyle choices are very important in easing your symptoms.  Eat small meals often instead of 3 large meals a day. Eat your meals slowly, and in a place where you are relaxed.  Limit high-fat foods such as fatty meat or fried foods.  Avoid bending over or lying down until 2-3 hours after eating.  Avoid peppermint and spearmint, caffeine, alcohol, and chocolate. This information is not intended to replace advice given to you by your health care provider. Make sure you discuss any questions you have with your health care provider. Document Released: 02/27/2012 Document Revised: 10/03/2016 Document Reviewed: 10/03/2016 Elsevier Interactive Patient Education  2019 [employer].

## 2018-09-27 NOTE — Progress Notes (Signed)
[facility]  New Patient Encounter Provider: Lanae Boast, [place]  JSE:[id number]  DOB - Oct 09, 1955  SUBJECTIVE:   [name], is a 63 y.o. female who presents to establish care with this clinic.   Current problems/concerns:  Patient presents to establish care with this clinic.  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 Patient also reports history of adrenal gland removal, Cushing syndrome, and partial hysterectomy. Patient reports difficulty with sleeping x63month.  Patient reports having difficulty with falling asleep and staying asleep.  Contributes lack of sleep to steroid use. Patient [facility]. 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 Requesting records from previous providers today.    Allergies  Allergen Reactions  . Aspirin Other (See Comments)    Can take baby aspirin (heart flutters with high doses)  . Ibuprofen [Ibuprofen] Nausea And Vomiting  . Penicillins Nausea And Vomiting    Has patient had a PCN reaction causing immediate rash, facial/tongue/throat swelling, SOB or lightheadedness with hypotension: Yes Has patient had a PCN reaction causing severe rash involving mucus membranes or skin necrosis: No Has patient had a PCN reaction that required hospitalization No Has patient had a PCN reaction occurring within the last 10 years: No If all of the above answers are "NO", then may proceed with Cephalosporin use.    Past Medical History:  Diagnosis Date  . 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 Amblyopia   . Anemia   .  Arthritis    "in my lower back"  . Asthma   . Atrial myxoma   . Blindness of left eye 1978  . Blood transfusion   . Breast cyst   . Constipation   . 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 CVA (cerebral infarction)   . Depression   . Depression   . 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 Hemorrhoids   . Hyperlipemia   . Hypothyroidism    per office note dated 03/25/2014  . Migraine   . Peptic ulcer associated with Helicobacter pylori infection   . Reflux   . Sinus headache   . Stomach ulcer    "from the hydrocortisone"  . Stroke [facility]) 1978   "piece tumor broke off & went to left eye; leaving me blind"  . Thyroid disease    pt unsure if hyper or hypo???   Current Outpatient Medications on File Prior to Visit  Medication Sig Dispense Refill  . aspirin EC 81 MG tablet Take 81 mg by mouth every morning.    . hydrocortisone (CORTEF) 20 MG tablet Take 0.5-1 tablets (10-20 mg total) by mouth 2 (two) times daily. Take 1 tablet every morning and 1/2 tablet every evening (Patient taking differently: Take 10-20 mg by mouth See admin instructions. Take 1 tablet every morning and 1/2 tablet every evening) 45 tablet o  . levothyroxine (SYNTHROID, LEVOTHROID) 75 MCG tablet Take 1 tablet (75 mcg total) by mouth daily. 30 tablet 0  . metoprolol tartrate (LOPRESSOR) 25 MG tablet Take 0.5 tablets (12.5 mg total)  by mouth 2 (two) times daily. (Patient taking differently: Take 25 mg by mouth daily. ) 30 tablet o   No current facility-administered medications on file prior to visit.    Family History  Problem Relation Age of Onset  . Colon cancer Sister 56  . Acromegaly Father   . Heart disease Father        Tumor in the heart  . Stomach cancer Father   . Diabetes Sister   . Kidney disease Sister        x2  . Heart disease Sister        Tumor in the heart  . Heart disease Daughter        Tumor in the heart  . Colon polyps Neg Hx   . Esophageal cancer Neg Hx   . Gallbladder disease Neg Hx    Social  History   Socioeconomic History  . Marital status: Married    Spouse name: Not on file  . Number of children: 2  . Years of education: Not on file  . Highest education level: Not on file  Occupational History  . Occupation: Disabled  Social Needs  . Financial resource strain: Not on file  . Food insecurity:    Worry: Not on file    Inability: Not on file  . Transportation needs:    Medical: Not on file    Non-medical: Not on file  Tobacco Use  . Smoking status: Never Smoker  . Smokeless tobacco: Never Used  Substance and Sexual Activity  . Alcohol use: No  . Drug use: No  . Sexual activity: Yes  Lifestyle  . Physical activity:    Days per week: Not on file    Minutes per session: Not on file  . Stress: Not on file  Relationships  . Social connections:    Talks on phone: Not on file    Gets together: Not on file    Attends religious service: Not on file    Active member of club or organization: Not on file    Attends meetings of clubs or organizations: Not on file    Relationship status: Not on file  . Intimate partner violence:    Fear of current or ex partner: Not on file    Emotionally abused: Not on file    Physically abused: Not on file    Forced sexual activity: Not on file  Other Topics Concern  . Not on file  Social History Narrative  . Not on file    Review of Systems  Constitutional: Negative.   HENT: Negative.   Eyes: Negative.   Respiratory: Negative.   Cardiovascular: Negative.   Gastrointestinal: Negative.   Genitourinary: Negative.   Musculoskeletal: Negative.   Skin: Negative.   Neurological: Negative.   Psychiatric/Behavioral: The patient has insomnia.      OBJECTIVE:    BP 110/77 (BP Location: Left Arm, Patient Position: Sitting, Cuff Size: Large)   Pulse 70   Temp 98.4 F (36.9 C) (Oral)   Resp 16   Ht 4\' 11"  (1.499 m)   Wt 208 lb (94.3 kg)   SpO2 99%   BMI 42.01 kg/m   Physical Exam  Constitutional: She is oriented to  person, place, and time and well-developed, well-nourished, and in no distress. No distress.  HENT:  Head: Normocephalic and atraumatic.  Eyes: Conjunctivae are normal. Left eye exhibits abnormal extraocular motion and nystagmus. Left pupil is not reactive.  Neck: Normal range of motion. Neck  supple.  Cardiovascular: Normal rate, regular rhythm and intact distal pulses. Exam reveals no gallop and no friction rub.  No murmur heard. Pulmonary/Chest: Effort normal and breath sounds normal. No respiratory distress. She has no wheezes.  Abdominal: Soft. Bowel sounds are normal. There is no abdominal tenderness.  Musculoskeletal: Normal range of motion.        General: No tenderness or edema.  Lymphadenopathy:    She has no cervical adenopathy.  Neurological: She is alert and oriented to person, place, and time. Gait normal.  Skin: Skin is warm and dry.  Incisional scar noted to the sternum  Psychiatric: Mood, memory, affect and judgment normal.  Nursing note and vitals reviewed.    ASSESSMENT/PLAN: 1. Hypertension, unspecified type Followed by Dr. Terrence Dupont.  Advised to continue visit with specialist.  Records requested today. - Urinalysis Dipstick - CBC with Differential - Comprehensive metabolic panel - Lipid Panel  2. Insomnia, unspecified type Patient referred to [facility] for further evaluation.  Started on trazodone 50 mg nightly.  Discussed benefits and risk of medication - traZODone (DESYREL) 50 MG tablet; Take 0.5-1 tablets (25-50 mg total) by mouth at bedtime as needed for sleep.  Dispense: 30 tablet; Refill: 2  3. Depression, unspecified depression type Patient referred to [facility] for further evaluation.  4. Gastroesophageal reflux disease, esophagitis presence not specified Refilled medications. - omeprazole (PRILOSEC) 20 MG capsule; Take 1 capsule (20 mg total) by mouth daily.  Dispense: 30 capsule; Refill: 2  5. Adrenal insufficiency ([place]) Records requested - Thyroid  Panel With TSH  6. Adjustment disorder with mixed anxiety and depressed mood Referred to [facility] for further evaluation - venlafaxine XR (EFFEXOR-XR) 150 MG 24 hr capsule; Take 1 capsule (150 mg total) by mouth daily.  Dispense: 30 capsule; Refill: 2  7. Hypothyroidism, unspecified type We will check thyroid levels today.  Records requested from previous provider. - Thyroid Panel With TSH    Return in about 3 months (around 12/27/2018) for HTN .  The patient was given clear instructions to go to ER or return to medical center if symptoms don't improve, worsen or new problems develop. The patient verbalized understanding. The patient was told to call to get lab results if they haven't heard anything in the next week.     This note has been created with Surveyor, quantity. Any transcriptional errors are unintentional.   [name], FNP-BC [facility] [address] [place], [place] [address] [phone]

## 2018-09-28 LAB — COMPREHENSIVE METABOLIC PANEL
ALT: 17 IU/L (ref 0–32)
AST: 22 IU/L (ref 0–40)
Albumin/Globulin Ratio: 1.5 (ref 1.2–2.2)
Albumin: 4.4 g/dL (ref 3.6–4.8)
Alkaline Phosphatase: 71 IU/L (ref 39–117)
BUN/Creatinine Ratio: 10 — ABNORMAL LOW (ref 12–28)
BUN: 11 mg/dL (ref 8–27)
Bilirubin Total: 0.3 mg/dL (ref 0.0–1.2)
CO2: 24 mmol/L (ref 20–29)
Calcium: 9.9 mg/dL (ref 8.7–10.3)
Chloride: 100 mmol/L (ref 96–106)
Creatinine, Ser: 1.11 mg/dL — ABNORMAL HIGH (ref 0.57–1.00)
GFR calc Af Amer: 62 mL/min/{1.73_m2} (ref >59)
GFR calc non Af Amer: 54 mL/min/{1.73_m2} — ABNORMAL LOW (ref >59)
Globulin, Total: 2.9 g/dL (ref 1.5–4.5)
Glucose: 94 mg/dL (ref 65–99)
Potassium: 4.7 mmol/L (ref 3.5–5.2)
Sodium: 138 mmol/L (ref 134–144)
Total Protein: 7.3 g/dL (ref 6.0–8.5)

## 2018-09-28 LAB — CBC WITH DIFFERENTIAL/PLATELET
Basophils Absolute: 0 10*3/uL (ref 0.0–0.2)
Basos: 1 %
EOS (ABSOLUTE): 0.4 10*3/uL (ref 0.0–0.4)
Eos: 7 %
Hematocrit: 40.2 % (ref 34.0–46.6)
Hemoglobin: 13.2 g/dL (ref 11.1–15.9)
Immature Grans (Abs): 0 10*3/uL (ref 0.0–0.1)
Immature Granulocytes: 0 %
Lymphocytes Absolute: 2.3 10*3/uL (ref 0.7–3.1)
Lymphs: 35 %
MCH: 29.3 pg (ref 26.6–33.0)
MCHC: 32.8 g/dL (ref 31.5–35.7)
MCV: 89 fL (ref 79–97)
Monocytes Absolute: 0.5 10*3/uL (ref 0.1–0.9)
Monocytes: 8 %
Neutrophils Absolute: 3.2 10*3/uL (ref 1.4–7.0)
Neutrophils: 49 %
Platelets: 355 10*3/uL (ref 150–450)
RBC: 4.51 x10E6/uL (ref 3.77–5.28)
RDW: 13.7 % (ref 11.7–15.4)
WBC: 6.4 10*3/uL (ref 3.4–10.8)

## 2018-09-28 LAB — LIPID PANEL
Chol/HDL Ratio: 3.3 ratio (ref 0.0–4.4)
Cholesterol, Total: 164 mg/dL (ref 100–199)
HDL: 50 mg/dL (ref >39)
LDL Calculated: 73 mg/dL (ref 0–99)
Triglycerides: 207 mg/dL — ABNORMAL HIGH (ref 0–149)
VLDL Cholesterol Cal: 41 mg/dL — ABNORMAL HIGH (ref 5–40)

## 2018-09-28 LAB — THYROID PANEL WITH TSH
Free Thyroxine Index: 2.4 (ref 1.2–4.9)
T3 Uptake Ratio: 27 % (ref 24–39)
T4, Total: 8.9 ug/dL (ref 4.5–12.0)
TSH: 1.35 u[IU]/mL (ref 0.450–4.500)

## 2018-10-03 ENCOUNTER — Telehealth: Payer: Self-pay

## 2018-10-03 MED ORDER — OMEGA-3-ACID ETHYL ESTERS 1 G PO CAPS
1.0000 g | ORAL_CAPSULE | Freq: Two times a day (BID) | ORAL | 3 refills | Status: DC
Start: 2018-10-03 — End: 2019-07-18

## 2018-10-03 MED ORDER — FENOFIBRATE 48 MG PO TABS
48.0000 mg | ORAL_TABLET | Freq: Every day | ORAL | 3 refills | Status: DC
Start: 2018-10-03 — End: 2019-09-26

## 2018-10-03 NOTE — Addendum Note (Signed)
Addended by: Genelle Bal on: 10/03/2018 08:47 AM   Modules accepted: Orders

## 2018-10-03 NOTE — Telephone Encounter (Signed)
-----   Message from Lanae Boast, [place] sent at 10/03/2018  8:47 AM EST ----- Please inform patient that her triglycerides are elevated.  She will need to lower her carbohydrate intake, reduce her food portions, and exercise for 150 minutes a week which equates to 30 minutes a day 5 times a week.  I am also going to send 2 medications,  Fenofibrate and lovaza  to the pharmacy for her.

## 2018-10-03 NOTE — Telephone Encounter (Signed)
Called number on chart, no correct number. Will mail letter. Thanks!

## 2018-10-10 ENCOUNTER — Telehealth: Payer: Self-pay

## 2018-10-10 NOTE — Telephone Encounter (Signed)
Called, no answer. Left a message to call back. Thanks!  

## 2018-12-06 ENCOUNTER — Other Ambulatory Visit: Payer: Self-pay

## 2018-12-06 ENCOUNTER — Emergency Department (HOSPITAL_COMMUNITY): Payer: Medicaid Other

## 2018-12-06 ENCOUNTER — Inpatient Hospital Stay (HOSPITAL_COMMUNITY)
Admission: EM | Admit: 2018-12-06 | Discharge: 2018-12-09 | DRG: 682 | Disposition: A | Discharge status: Home / Self Care | Payer: Medicaid Other | Attending: Family Medicine | Admitting: Family Medicine

## 2018-12-06 ENCOUNTER — Encounter (HOSPITAL_COMMUNITY): Payer: Self-pay

## 2018-12-06 DIAGNOSIS — R6520 Severe sepsis without septic shock: Secondary | ICD-10-CM

## 2018-12-06 DIAGNOSIS — E876 Hypokalemia: Secondary | ICD-10-CM | POA: Diagnosis present

## 2018-12-06 DIAGNOSIS — E860 Dehydration: Secondary | ICD-10-CM | POA: Diagnosis present

## 2018-12-06 DIAGNOSIS — Z79899 Other long term (current) drug therapy: Secondary | ICD-10-CM

## 2018-12-06 DIAGNOSIS — Z888 Allergy status to other drugs, medicaments and biological substances status: Secondary | ICD-10-CM

## 2018-12-06 DIAGNOSIS — R740 Nonspecific elevation of levels of transaminase and lactic acid dehydrogenase [LDH]: Secondary | ICD-10-CM | POA: Diagnosis present

## 2018-12-06 DIAGNOSIS — E249 Cushing's syndrome, unspecified: Secondary | ICD-10-CM | POA: Diagnosis present

## 2018-12-06 DIAGNOSIS — Z8711 Personal history of peptic ulcer disease: Secondary | ICD-10-CM | POA: Diagnosis not present

## 2018-12-06 DIAGNOSIS — E2740 Unspecified adrenocortical insufficiency: Secondary | ICD-10-CM | POA: Diagnosis not present

## 2018-12-06 DIAGNOSIS — E2749 Other adrenocortical insufficiency: Secondary | ICD-10-CM | POA: Diagnosis present

## 2018-12-06 DIAGNOSIS — E871 Hypo-osmolality and hyponatremia: Secondary | ICD-10-CM | POA: Diagnosis present

## 2018-12-06 DIAGNOSIS — R1110 Vomiting, unspecified: Secondary | ICD-10-CM | POA: Diagnosis not present

## 2018-12-06 DIAGNOSIS — H5462 Unqualified visual loss, left eye, normal vision right eye: Secondary | ICD-10-CM | POA: Diagnosis present

## 2018-12-06 DIAGNOSIS — Z20828 Contact with and (suspected) exposure to other viral communicable diseases: Secondary | ICD-10-CM | POA: Diagnosis not present

## 2018-12-06 DIAGNOSIS — R509 Fever, unspecified: Secondary | ICD-10-CM

## 2018-12-06 DIAGNOSIS — E785 Hyperlipidemia, unspecified: Secondary | ICD-10-CM | POA: Diagnosis present

## 2018-12-06 DIAGNOSIS — F329 Major depressive disorder, single episode, unspecified: Secondary | ICD-10-CM | POA: Diagnosis not present

## 2018-12-06 DIAGNOSIS — A419 Sepsis, unspecified organism: Secondary | ICD-10-CM

## 2018-12-06 DIAGNOSIS — Z90710 Acquired absence of both cervix and uterus: Secondary | ICD-10-CM

## 2018-12-06 DIAGNOSIS — J45909 Unspecified asthma, uncomplicated: Secondary | ICD-10-CM | POA: Diagnosis present

## 2018-12-06 DIAGNOSIS — J9601 Acute respiratory failure with hypoxia: Secondary | ICD-10-CM | POA: Diagnosis not present

## 2018-12-06 DIAGNOSIS — Z8673 Personal history of transient ischemic attack (TIA), and cerebral infarction without residual deficits: Secondary | ICD-10-CM

## 2018-12-06 DIAGNOSIS — B349 Viral infection, unspecified: Secondary | ICD-10-CM | POA: Diagnosis present

## 2018-12-06 DIAGNOSIS — E039 Hypothyroidism, unspecified: Secondary | ICD-10-CM | POA: Diagnosis present

## 2018-12-06 DIAGNOSIS — E872 Acidosis: Secondary | ICD-10-CM | POA: Diagnosis present

## 2018-12-06 DIAGNOSIS — R350 Frequency of micturition: Secondary | ICD-10-CM | POA: Diagnosis present

## 2018-12-06 DIAGNOSIS — R112 Nausea with vomiting, unspecified: Secondary | ICD-10-CM | POA: Diagnosis present

## 2018-12-06 DIAGNOSIS — I10 Essential (primary) hypertension: Secondary | ICD-10-CM | POA: Diagnosis not present

## 2018-12-06 DIAGNOSIS — M1990 Unspecified osteoarthritis, unspecified site: Secondary | ICD-10-CM | POA: Diagnosis present

## 2018-12-06 DIAGNOSIS — Z880 Allergy status to penicillin: Secondary | ICD-10-CM

## 2018-12-06 DIAGNOSIS — Z7982 Long term (current) use of aspirin: Secondary | ICD-10-CM | POA: Diagnosis not present

## 2018-12-06 DIAGNOSIS — R7981 Abnormal blood-gas level: Secondary | ICD-10-CM

## 2018-12-06 DIAGNOSIS — Z7952 Long term (current) use of systemic steroids: Secondary | ICD-10-CM

## 2018-12-06 DIAGNOSIS — Z79890 Hormone replacement therapy (postmenopausal): Secondary | ICD-10-CM

## 2018-12-06 DIAGNOSIS — Z886 Allergy status to analgesic agent status: Secondary | ICD-10-CM

## 2018-12-06 DIAGNOSIS — N179 Acute kidney failure, unspecified: Secondary | ICD-10-CM | POA: Diagnosis not present

## 2018-12-06 DIAGNOSIS — R05 Cough: Secondary | ICD-10-CM | POA: Diagnosis not present

## 2018-12-06 DIAGNOSIS — R0602 Shortness of breath: Secondary | ICD-10-CM | POA: Diagnosis not present

## 2018-12-06 LAB — CBC WITH DIFFERENTIAL/PLATELET
Abs Immature Granulocytes: 0.03 10*3/uL (ref 0.00–0.07)
Basophils Absolute: 0.1 10*3/uL (ref 0.0–0.1)
Basophils Relative: 1 %
Eosinophils Absolute: 1.4 10*3/uL — ABNORMAL HIGH (ref 0.0–0.5)
Eosinophils Relative: 15 %
HCT: 47.2 % — ABNORMAL HIGH (ref 36.0–46.0)
Hemoglobin: 15.3 g/dL — ABNORMAL HIGH (ref 12.0–15.0)
Immature Granulocytes: 0 %
Lymphocytes Relative: 29 %
Lymphs Abs: 2.8 10*3/uL (ref 0.7–4.0)
MCH: 29.5 pg (ref 26.0–34.0)
MCHC: 32.4 g/dL (ref 30.0–36.0)
MCV: 91.1 fL (ref 80.0–100.0)
Monocytes Absolute: 1.5 10*3/uL — ABNORMAL HIGH (ref 0.1–1.0)
Monocytes Relative: 15 %
Neutro Abs: 3.8 10*3/uL (ref 1.7–7.7)
Neutrophils Relative %: 40 %
Platelets: 367 10*3/uL (ref 150–400)
RBC: 5.18 MIL/uL — ABNORMAL HIGH (ref 3.87–5.11)
RDW: 13.2 % (ref 11.5–15.5)
WBC: 9.6 10*3/uL (ref 4.0–10.5)
nRBC: 0 % (ref 0.0–0.2)

## 2018-12-06 LAB — POCT I-STAT EG7
Acid-base deficit: 6 mmol/L — ABNORMAL HIGH (ref 0.0–2.0)
BICARBONATE: 19 mmol/L — AB (ref 20.0–28.0)
Calcium, Ion: 1.24 mmol/L (ref 1.15–1.40)
HCT: 48 % — ABNORMAL HIGH (ref 36.0–46.0)
Hemoglobin: 16.3 g/dL — ABNORMAL HIGH (ref 12.0–15.0)
O2 Saturation: 82 %
Potassium: 3.4 mmol/L — ABNORMAL LOW (ref 3.5–5.1)
Sodium: 132 mmol/L — ABNORMAL LOW (ref 135–145)
TCO2: 20 mmol/L — ABNORMAL LOW (ref 22–32)
pCO2, Ven: 36.2 mmHg — ABNORMAL LOW (ref 44.0–60.0)
pH, Ven: 7.328 (ref 7.250–7.430)
pO2, Ven: 50 mmHg — ABNORMAL HIGH (ref 32.0–45.0)

## 2018-12-06 LAB — BLOOD GAS, VENOUS
Acid-base deficit: 5.7 mmol/L — ABNORMAL HIGH (ref 0.0–2.0)
Bicarbonate: 21.7 mmol/L (ref 20.0–28.0)
O2 Saturation: 46.3 %
Patient temperature: 98.6
pCO2, Ven: 52.4 mmHg (ref 44.0–60.0)
pH, Ven: 7.241 — ABNORMAL LOW (ref 7.250–7.430)

## 2018-12-06 LAB — CBC
HCT: 48.7 % — ABNORMAL HIGH (ref 36.0–46.0)
Hemoglobin: 15.1 g/dL — ABNORMAL HIGH (ref 12.0–15.0)
MCH: 28.9 pg (ref 26.0–34.0)
MCHC: 31 g/dL (ref 30.0–36.0)
MCV: 93.1 fL (ref 80.0–100.0)
NRBC: 0 % (ref 0.0–0.2)
Platelets: 272 10*3/uL (ref 150–400)
RBC: 5.23 MIL/uL — ABNORMAL HIGH (ref 3.87–5.11)
RDW: 13.1 % (ref 11.5–15.5)
WBC: 9.9 10*3/uL (ref 4.0–10.5)

## 2018-12-06 LAB — LACTIC ACID, PLASMA
Lactic Acid, Venous: 2.1 mmol/L (ref 0.5–1.9)
Lactic Acid, Venous: 1.5 mmol/L (ref 0.5–1.9)

## 2018-12-06 LAB — COMPREHENSIVE METABOLIC PANEL
ALT: 76 U/L — ABNORMAL HIGH (ref 0–44)
AST: 110 U/L — ABNORMAL HIGH (ref 15–41)
Albumin: 3.9 g/dL (ref 3.5–5.0)
Alkaline Phosphatase: 66 U/L (ref 38–126)
Anion gap: 18 — ABNORMAL HIGH (ref 5–15)
BUN: 18 mg/dL (ref 6–20)
CO2: 19 mmol/L — ABNORMAL LOW (ref 22–32)
Calcium: 10.3 mg/dL (ref 8.9–10.3)
Chloride: 95 mmol/L — ABNORMAL LOW (ref 98–111)
Creatinine, Ser: 1.53 mg/dL — ABNORMAL HIGH (ref 0.44–1.00)
GFR calc Af Amer: 42 mL/min — ABNORMAL LOW (ref >60)
GFR calc non Af Amer: 37 mL/min — ABNORMAL LOW (ref >60)
Glucose, Bld: 82 mg/dL (ref 70–99)
Potassium: 3.7 mmol/L (ref 3.5–5.1)
Sodium: 132 mmol/L — ABNORMAL LOW (ref 135–145)
Total Bilirubin: 1.5 mg/dL — ABNORMAL HIGH (ref 0.3–1.2)
Total Protein: 8.1 g/dL (ref 6.5–8.1)

## 2018-12-06 LAB — URINALYSIS, ROUTINE W REFLEX MICROSCOPIC
Bilirubin Urine: NEGATIVE
Glucose, UA: NEGATIVE mg/dL
Ketones, ur: 80 mg/dL — AB
Leukocytes,Ua: NEGATIVE
Nitrite: NEGATIVE
Protein, ur: 100 mg/dL — AB
Specific Gravity, Urine: 1.02 (ref 1.005–1.030)
pH: 5 (ref 5.0–8.0)

## 2018-12-06 LAB — RESPIRATORY PANEL BY PCR
Adenovirus: NOT DETECTED
Bordetella pertussis: NOT DETECTED
CORONAVIRUS HKU1-RVPPCR: NOT DETECTED
Chlamydophila pneumoniae: NOT DETECTED
Coronavirus 229E: NOT DETECTED
Coronavirus NL63: NOT DETECTED
Coronavirus OC43: NOT DETECTED
Influenza A: NOT DETECTED
Influenza B: NOT DETECTED
Metapneumovirus: NOT DETECTED
Mycoplasma pneumoniae: NOT DETECTED
PARAINFLUENZA VIRUS 2-RVPPCR: NOT DETECTED
Parainfluenza Virus 1: NOT DETECTED
Parainfluenza Virus 3: NOT DETECTED
Parainfluenza Virus 4: NOT DETECTED
Respiratory Syncytial Virus: NOT DETECTED
Rhinovirus / Enterovirus: NOT DETECTED

## 2018-12-06 LAB — PROCALCITONIN: PROCALCITONIN: 0.37 ng/mL

## 2018-12-06 LAB — LACTATE DEHYDROGENASE: LDH: 201 U/L — ABNORMAL HIGH (ref 98–192)

## 2018-12-06 LAB — CREATININE, SERUM
Creatinine, Ser: 1.45 mg/dL — ABNORMAL HIGH (ref 0.44–1.00)
GFR calc Af Amer: 45 mL/min — ABNORMAL LOW (ref >60)
GFR calc non Af Amer: 39 mL/min — ABNORMAL LOW (ref >60)

## 2018-12-06 LAB — CBG MONITORING, ED: Glucose-Capillary: 85 mg/dL (ref 70–99)

## 2018-12-06 LAB — INFLUENZA PANEL BY PCR (TYPE A & B)
Influenza A By PCR: NEGATIVE
Influenza B By PCR: NEGATIVE

## 2018-12-06 LAB — I-STAT CREATININE, ED: Creatinine, Ser: 1.4 mg/dL — ABNORMAL HIGH (ref 0.44–1.00)

## 2018-12-06 LAB — FERRITIN: Ferritin: 528 ng/mL — ABNORMAL HIGH (ref 11–307)

## 2018-12-06 MED ORDER — ALBUTEROL SULFATE HFA 108 (90 BASE) MCG/ACT IN AERS
1.0000 | INHALATION_SPRAY | RESPIRATORY_TRACT | Status: DC | PRN
  Filled 2018-12-06: qty 6.7

## 2018-12-06 MED ORDER — HYDROCORTISONE NA SUCCINATE PF 100 MG IJ SOLR
100.0000 mg | Freq: Once | INTRAMUSCULAR | Status: AC
  Administered 2018-12-06: 100 mg via INTRAVENOUS
  Filled 2018-12-06: qty 2

## 2018-12-06 MED ORDER — ACETAMINOPHEN 325 MG PO TABS: 650.0000 mg | ORAL_TABLET | Freq: Four times a day (QID) | ORAL | Status: DC | PRN

## 2018-12-06 MED ORDER — ACETAMINOPHEN 650 MG RE SUPP
650.0000 mg | Freq: Once | RECTAL | Status: AC
  Administered 2018-12-06: 650 mg via RECTAL
  Filled 2018-12-06: qty 1

## 2018-12-06 MED ORDER — SODIUM CHLORIDE 0.9 % IV SOLN: 1000.0000 mL | INTRAVENOUS | Status: DC

## 2018-12-06 MED ORDER — SODIUM CHLORIDE 0.9 % IV SOLN
INTRAVENOUS | Status: DC
  Administered 2018-12-06: 20:00:00 via INTRAVENOUS

## 2018-12-06 MED ORDER — HYDROCORTISONE 20 MG PO TABS
20.0000 mg | ORAL_TABLET | Freq: Every day | ORAL | Status: DC
  Filled 2018-12-06: qty 1

## 2018-12-06 MED ORDER — ONDANSETRON HCL 4 MG/2ML IJ SOLN: 4.0000 mg | Freq: Four times a day (QID) | INTRAMUSCULAR | Status: DC | PRN

## 2018-12-06 MED ORDER — HYDROCORTISONE 10 MG PO TABS
10.0000 mg | ORAL_TABLET | Freq: Every day | ORAL | Status: DC
  Filled 2018-12-06: qty 1

## 2018-12-06 MED ORDER — METRONIDAZOLE IN NACL 5-0.79 MG/ML-% IV SOLN
500.0000 mg | Freq: Once | INTRAVENOUS | Status: AC
  Administered 2018-12-06: 500 mg via INTRAVENOUS
  Filled 2018-12-06: qty 100

## 2018-12-06 MED ORDER — POTASSIUM CHLORIDE CRYS ER 20 MEQ PO TBCR
40.0000 meq | EXTENDED_RELEASE_TABLET | Freq: Once | ORAL | Status: AC
  Administered 2018-12-06: 40 meq via ORAL
  Filled 2018-12-06: qty 2

## 2018-12-06 MED ORDER — ENOXAPARIN SODIUM 40 MG/0.4ML ~~LOC~~ SOLN
40.0000 mg | SUBCUTANEOUS | Status: DC
  Administered 2018-12-07 – 2018-12-08 (×2): 40 mg via SUBCUTANEOUS
  Filled 2018-12-06 (×5): qty 0.4

## 2018-12-06 MED ORDER — SODIUM CHLORIDE 0.9 % IV SOLN
2.0000 g | Freq: Once | INTRAVENOUS | Status: AC
  Administered 2018-12-06: 2 g via INTRAVENOUS
  Filled 2018-12-06: qty 2

## 2018-12-06 MED ORDER — LEVOTHYROXINE SODIUM 100 MCG/5ML IV SOLN
37.5000 ug | Freq: Every day | INTRAVENOUS | Status: DC
  Administered 2018-12-07 – 2018-12-09 (×3): 37.5 ug via INTRAVENOUS
  Filled 2018-12-06 (×3): qty 5

## 2018-12-06 MED ORDER — LABETALOL HCL 5 MG/ML IV SOLN: 5.0000 mg | INTRAVENOUS | Status: DC | PRN

## 2018-12-06 NOTE — ED Triage Notes (Signed)
Per [facility]- Pt resides at hotel. Pt altered on lethargic on arrival CBG 61- D10 given. CBG 120. Pt endorse chill, fever, cough, N/V/D generalized weakness and body aches. Pt denies being exposed to  [place]. Neuro status improved. Denies chest pain and shortness of breath. Pt able to ambulate with assistance to stretcher. NEG for stroke. EKG ST. Low risk COVID states EMS.

## 2018-12-06 NOTE — ED Notes (Signed)
Bed: PO24 Expected date:  Expected time:  Means of arrival:  Comments: 63 yo flu-like s/sx

## 2018-12-06 NOTE — ED Notes (Addendum)
BLOOD CULTURE BLOOD TOP ONLY OBTAIN BY SUSAN C RN LEFT AC

## 2018-12-06 NOTE — Progress Notes (Signed)
A consult was received from an ED physician for cefepime per pharmacy dosing.  The patient's profile has been reviewed for ht/wt/allergies/indication/available labs.   A one time order has been placed for cefepime 2 gm IV x 1 dose.    Further antibiotics/pharmacy consults should be ordered by admitting physician if indicated.                       Thank you, Eudelia Bunch, Pharm.D 12/06/2018 4:03 PM

## 2018-12-06 NOTE — ED Notes (Signed)
ED TO INPATIENT HANDOFF REPORT  ED Nurse Name and Phone #: jon rn wled   S Name/Age/Gender [name] 63 y.o. female Room/Bed: WA22/WA22  Code Status   Code Status: Full Code  Home/SNF/Other Homeless living out of a hotel  Patient oriented to: self and situation Is this baseline? Weak    Triage Complete: Triage complete  Chief Complaint flu like symptoms  Triage Note Per [facility]- Pt resides at hotel. Pt altered on lethargic on arrival CBG 61- D10 given. CBG 120. Pt endorse chill, fever, cough, N/V/D generalized weakness and body aches. Pt denies being exposed to  [place]. Neuro status improved. Denies chest pain and shortness of breath. Pt able to ambulate with assistance to stretcher. NEG for stroke. EKG ST. Low risk COVID states EMS.    Allergies Allergies  Allergen Reactions  . Aspirin Other (See Comments)    Can take baby aspirin (heart flutters with high doses)  . Ibuprofen [Ibuprofen] Nausea And Vomiting  . Penicillins Nausea And Vomiting    Has patient had a PCN reaction causing immediate rash, facial/tongue/throat swelling, SOB or lightheadedness with hypotension: Yes Has patient had a PCN reaction causing severe rash involving mucus membranes or skin necrosis: No Has patient had a PCN reaction that required hospitalization No Has patient had a PCN reaction occurring within the last 10 years: No If all of the above answers are "NO", then may proceed with Cephalosporin use.     Level of Care/Admitting Diagnosis ED Disposition    ED Disposition Condition [facility] Area: [place] [[id number]]  Level of Care: Progressive [102]  Diagnosis: ARF (acute renal failure) ([place]) [409735]  Admitting Physician: Velta Addison  Attending Physician: CHIU, STEPHEN K [[id number]]  Estimated length of stay: 3 - 4 days  Certification:: I certify this patient will need inpatient services for at least 2 midnights  Bed request comments: COVID  admit, low risk  PT Class (Do Not Modify): Inpatient [101]  PT Acc Code (Do Not Modify): Private [1]       B Medical/Surgery History Past Medical History:  Diagnosis Date  . 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 Amblyopia   . Anemia   . Arthritis    "in my lower back"  . Asthma   . Atrial myxoma   . Blindness of left eye 1978  . Blood transfusion   . Breast cyst   . Constipation   . 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 CVA (cerebral infarction)   . Depression   . Depression   . 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 Hemorrhoids   . Hyperlipemia   . Hypothyroidism    per office note dated 03/25/2014  . Migraine   . Peptic ulcer associated with Helicobacter pylori infection   . Reflux   . Sinus headache   . Stomach ulcer    "from the hydrocortisone"  . Stroke [facility]) 1978   "piece tumor broke off & went to left eye; leaving me blind"  . Thyroid disease    pt unsure if hyper or hypo???   Past Surgical History:  Procedure Laterality Date  . ADRENAL GLAND SURGERY  1982   on hydrocortisone  . ATRIAL MYXOMA EXCISION  1978  . BREAST BIOPSY  07/1999   ductal tissue excision & bx right; bx only left breast  . EYE SURGERY    . LEFT HEART CATHETERIZATION WITH CORONARY ANGIOGRAM N/A 10/06/2011   Procedure: LEFT HEART CATHETERIZATION WITH  CORONARY ANGIOGRAM;  Surgeon: Clent Demark, MD;  Location: [facility] CATH LAB;  Service: Cardiovascular;  Laterality: N/A;  . repaired cross eyed     "as a child"  . ROTATOR CUFF REPAIR  05/2005   left  . VAGINAL HYSTERECTOMY  2000   "partial; w/left ovary"     A IV Location/Drains/Wounds Patient Lines/Drains/Airways Status   Active Line/Drains/Airways    Name:   Placement date:   Placement time:   Site:   Days:   Peripheral IV 09/04/17 Left Hand   09/04/17    1159    Hand   458   Peripheral IV 12/06/18 Left Hand   12/06/18    1438    Hand   less than 1   Peripheral IV 12/06/18 Left Antecubital   12/06/18    1504    Antecubital   less than 1   Peripheral  IV 12/06/18 Left Antecubital   12/06/18    1532    Antecubital   less than 1   External Urinary Catheter   12/06/18    1731    -   less than 1          Intake/Output Last 24 hours No intake or output data in the 24 hours ending 12/06/18 2122  Labs/Imaging Results for orders placed or performed during the hospital encounter of 12/06/18 (from the past 48 hour(s))  CBG monitoring, ED     Status: None   Collection Time: 12/06/18  2:21 PM  Result Value Ref Range   Glucose-Capillary 85 70 - 99 mg/dL  Lactic acid, plasma     Status: Abnormal   Collection Time: 12/06/18  2:27 PM  Result Value Ref Range   Lactic Acid, Venous 2.1 (HH) 0.5 - 1.9 mmol/L    Comment: CRITICAL RESULT CALLED TO, READ BACK BY AND VERIFIED WITHSharlyn Bologna RN [place] Performed at [facility], [place] [address], [place], [place] [address]   Comprehensive metabolic panel     Status: Abnormal   Collection Time: 12/06/18  2:27 PM  Result Value Ref Range   Sodium 132 (L) 135 - 145 mmol/L   Potassium 3.7 3.5 - 5.1 mmol/L   Chloride 95 (L) 98 - 111 mmol/L   CO2 19 (L) 22 - 32 mmol/L   Glucose, Bld 82 70 - 99 mg/dL   BUN 18 6 - 20 mg/dL   Creatinine, Ser 1.53 (H) 0.44 - 1.00 mg/dL   Calcium 10.3 8.9 - 10.3 mg/dL   Total Protein 8.1 6.5 - 8.1 g/dL   Albumin 3.9 3.5 - 5.0 g/dL   AST 110 (H) 15 - 41 U/L   ALT 76 (H) 0 - 44 U/L   Alkaline Phosphatase 66 38 - 126 U/L   Total Bilirubin 1.5 (H) 0.3 - 1.2 mg/dL   GFR calc non Af Amer 37 (L) >60 mL/min   GFR calc Af Amer 42 (L) >60 mL/min   Anion gap 18 (H) 5 - 15    Comment: Performed at [facility], [place] [address] [place], [place] [address]  CBC WITH DIFFERENTIAL     Status: Abnormal   Collection Time: 12/06/18  2:27 PM  Result Value Ref Range   WBC 9.6 4.0 - 10.5 K/uL   RBC 5.18 (H) 3.87 - 5.11 MIL/uL   Hemoglobin 15.3 (H) 12.0 - 15.0 g/dL   HCT 47.2 (H) 36.0 - 46.0 %   MCV 91.1 80.0 - 100.0 fL  MCH 29.5 26.0  - 34.0 pg   MCHC 32.4 30.0 - 36.0 g/dL   RDW 13.2 11.5 - 15.5 %   Platelets 367 150 - 400 K/uL   nRBC 0.0 0.0 - 0.2 %   Neutrophils Relative % 40 %   Neutro Abs 3.8 1.7 - 7.7 K/uL   Lymphocytes Relative 29 %   Lymphs Abs 2.8 0.7 - 4.0 K/uL   Monocytes Relative 15 %   Monocytes Absolute 1.5 (H) 0.1 - 1.0 K/uL   Eosinophils Relative 15 %   Eosinophils Absolute 1.4 (H) 0.0 - 0.5 K/uL   Basophils Relative 1 %   Basophils Absolute 0.1 0.0 - 0.1 K/uL   Immature Granulocytes 0 %   Abs Immature Granulocytes 0.03 0.00 - 0.07 K/uL    Comment: Performed at [facility], [place] [address] [place], [place] [address]  Blood gas, venous (at [facility] and [facility])     Status: Abnormal   Collection Time: 12/06/18  3:00 PM  Result Value Ref Range   pH, Ven 7.241 (L) 7.250 - 7.430   pCO2, Ven 52.4 44.0 - 60.0 mmHg   pO2, Ven BELOW REPORTABLE RANGE 32.0 - 45.0 mmHg    Comment: CRITICAL RESULT CALLED TO, READ BACK BY AND VERIFIED WITH: M FAWZE,PA AT 1515 BY T.BURGESS,RRT,RCP ON 12/06/2018    Bicarbonate 21.7 20.0 - 28.0 mmol/L   Acid-base deficit 5.7 (H) 0.0 - 2.0 mmol/L   O2 Saturation 46.3 %   Patient temperature 98.6    Collection site VEIN    Drawn by COLLECTED BY NURSE    Sample type VENOUS     Comment: Performed at [place] [address] [place], [place] [address]  Influenza panel by PCR (type A & B)     Status: None   Collection Time: 12/06/18  3:02 PM  Result Value Ref Range   Influenza A By PCR NEGATIVE NEGATIVE   Influenza B By PCR NEGATIVE NEGATIVE    Comment: (NOTE) The Xpert Xpress Flu assay is intended as an aid in the diagnosis of  influenza and should not be used as a sole basis for treatment.  This  assay is FDA approved for nasopharyngeal swab specimens only. Nasal  washings and aspirates are unacceptable for Xpert Xpress Flu testing. Performed at [facility], [place] [address], [place], [place] [address]   Respiratory Panel  by PCR     Status: None   Collection Time: 12/06/18  3:02 PM  Result Value Ref Range   Adenovirus NOT DETECTED NOT DETECTED   Coronavirus 229E NOT DETECTED NOT DETECTED    Comment: (NOTE) The Coronavirus on the Respiratory Panel, DOES NOT test for the novel  Coronavirus (2019 nCoV)    Coronavirus HKU1 NOT DETECTED NOT DETECTED   Coronavirus NL63 NOT DETECTED NOT DETECTED   Coronavirus OC43 NOT DETECTED NOT DETECTED   Metapneumovirus NOT DETECTED NOT DETECTED   Rhinovirus / Enterovirus NOT DETECTED NOT DETECTED   Influenza A NOT DETECTED NOT DETECTED   Influenza B NOT DETECTED NOT DETECTED   Parainfluenza Virus 1 NOT DETECTED NOT DETECTED   Parainfluenza Virus 2 NOT DETECTED NOT DETECTED   Parainfluenza Virus 3 NOT DETECTED NOT DETECTED   Parainfluenza Virus 4 NOT DETECTED NOT DETECTED   Respiratory Syncytial Virus NOT DETECTED NOT DETECTED   Bordetella pertussis NOT DETECTED NOT DETECTED   Chlamydophila pneumoniae NOT DETECTED NOT DETECTED   Mycoplasma pneumoniae NOT DETECTED NOT DETECTED  Comment: Performed at [facility], [place] [address], [place], [place] [address]  I-stat Creatinine, ED     Status: Abnormal   Collection Time: 12/06/18  3:39 PM  Result Value Ref Range   Creatinine, Ser 1.40 (H) 0.44 - 1.00 mg/dL  POCT I-Stat EG7     Status: Abnormal   Collection Time: 12/06/18  3:39 PM  Result Value Ref Range   pH, Ven 7.328 7.250 - 7.430   pCO2, Ven 36.2 (L) 44.0 - 60.0 mmHg   pO2, Ven 50.0 (H) 32.0 - 45.0 mmHg   Bicarbonate 19.0 (L) 20.0 - 28.0 mmol/L   TCO2 20 (L) 22 - 32 mmol/L   O2 Saturation 82.0 %   Acid-base deficit 6.0 (H) 0.0 - 2.0 mmol/L   Sodium 132 (L) 135 - 145 mmol/L   Potassium 3.4 (L) 3.5 - 5.1 mmol/L   Calcium, Ion 1.24 1.15 - 1.40 mmol/L   HCT 48.0 (H) 36.0 - 46.0 %   Hemoglobin 16.3 (H) 12.0 - 15.0 g/dL   Patient temperature HIDE    Sample type VENOUS   Urinalysis, Routine w reflex microscopic     Status: Abnormal   Collection Time:  12/06/18  5:17 PM  Result Value Ref Range   Color, Urine AMBER (A) YELLOW    Comment: BIOCHEMICALS MAY BE AFFECTED BY COLOR   APPearance HAZY (A) CLEAR   Specific Gravity, Urine 1.020 1.005 - 1.030   pH 5.0 5.0 - 8.0   Glucose, UA NEGATIVE NEGATIVE mg/dL   Hgb urine dipstick SMALL (A) NEGATIVE   Bilirubin Urine NEGATIVE NEGATIVE   Ketones, ur 80 (A) NEGATIVE mg/dL   Protein, ur 100 (A) NEGATIVE mg/dL   Nitrite NEGATIVE NEGATIVE   Leukocytes,Ua NEGATIVE NEGATIVE   RBC / HPF 0-5 0 - 5 RBC/hpf   WBC, UA 0-5 0 - 5 WBC/hpf   Bacteria, UA FEW (A) NONE SEEN   Squamous Epithelial / LPF 0-5 0 - 5   Mucus PRESENT    Hyaline Casts, UA PRESENT    Granular Casts, UA PRESENT     Comment: Performed at [facility], [place] [address], [place], [place] [address]  Lactic acid, plasma     Status: None   Collection Time: 12/06/18  6:26 PM  Result Value Ref Range   Lactic Acid, Venous 1.5 0.5 - 1.9 mmol/L    Comment: Performed at [facility], [place] [address] [place],  [address]  CBC     Status: Abnormal   Collection Time: 12/06/18  6:26 PM  Result Value Ref Range   WBC 9.9 4.0 - 10.5 K/uL   RBC 5.23 (H) 3.87 - 5.11 MIL/uL   Hemoglobin 15.1 (H) 12.0 - 15.0 g/dL   HCT 48.7 (H) 36.0 - 46.0 %   MCV 93.1 80.0 - 100.0 fL   MCH 28.9 26.0 - 34.0 pg   MCHC 31.0 30.0 - 36.0 g/dL   RDW 13.1 11.5 - 15.5 %   Platelets 272 150 - 400 K/uL   nRBC 0.0 0.0 - 0.2 %    Comment: Performed at [facility], [place] [address] [place], [place] [address]  Creatinine, serum     Status: Abnormal   Collection Time: 12/06/18  6:26 PM  Result Value Ref Range   Creatinine, Ser 1.45 (H) 0.44 - 1.00 mg/dL   GFR calc non Af Amer 39 (L) >60 mL/min   GFR calc Af Amer 45 (L) >60 mL/min  Comment: Performed at [facility], [place] [address], [place], [place] [address]  Procalcitonin     Status: None   Collection Time: 12/06/18  6:26 PM  Result Value  Ref Range   Procalcitonin 0.37 ng/mL    Comment:        Interpretation: PCT (Procalcitonin) <= 0.5 ng/mL: Systemic infection (sepsis) is not likely. Local bacterial infection is possible. (NOTE)       Sepsis PCT Algorithm           Lower Respiratory Tract                                      Infection PCT Algorithm    ----------------------------     ----------------------------         PCT < 0.25 ng/mL                PCT < 0.10 ng/mL         Strongly encourage             Strongly discourage   discontinuation of antibiotics    initiation of antibiotics    ----------------------------     -----------------------------       PCT 0.25 - 0.50 ng/mL            PCT 0.10 - 0.25 ng/mL               OR       >80% decrease in PCT            Discourage initiation of                                            antibiotics      Encourage discontinuation           of antibiotics    ----------------------------     -----------------------------         PCT >= 0.50 ng/mL              PCT 0.26 - 0.50 ng/mL               AND        <80% decrease in PCT             Encourage initiation of                                             antibiotics       Encourage continuation           of antibiotics    ----------------------------     -----------------------------        PCT >= 0.50 ng/mL                  PCT > 0.50 ng/mL               AND         increase in PCT                  Strongly encourage  initiation of antibiotics    Strongly encourage escalation           of antibiotics                                     -----------------------------                                           PCT <= 0.25 ng/mL                                                 OR                                        > 80% decrease in PCT                                     Discontinue / Do not initiate                                             antibiotics Performed at [place] [address] [place], [place] [address]   Ferritin     Status: Abnormal   Collection Time: 12/06/18  6:26 PM  Result Value Ref Range   Ferritin 528 (H) 11 - 307 ng/mL    Comment: Performed at [facility], [place] [address], [place], [place] [address]  Lactate dehydrogenase     Status: Abnormal   Collection Time: 12/06/18  6:26 PM  Result Value Ref Range   LDH 201 (H) 98 - 192 U/L    Comment: Performed at [facility], [place] [address], [place], [place] [address]   Dg Chest Portable 1 View  Result Date: 12/06/2018 CLINICAL DATA:  Cough and shortness of breath EXAM: PORTABLE CHEST 1 VIEW COMPARISON:  August 11, 2017 FINDINGS: Lungs are clear. Heart size and pulmonary vascularity are normal. No adenopathy. Patient is status post median sternotomy. No bone lesions. IMPRESSION: No edema or consolidation. Electronically Signed   By: Lowella Grip III M.D.   On: 12/06/2018 15:50    Pending Labs Unresulted Labs (From admission, onward)    Start     Ordered   12/13/18 0500  Creatinine, serum  (enoxaparin (LOVENOX)    CrCl >/= 30 ml/min)  Weekly,   R    Comments:  while on enoxaparin therapy    12/06/18 1733   12/07/18 0500  Comprehensive metabolic panel  Daily,   R     12/06/18 1733   12/07/18 0500  CBC with Differential/Platelet  Daily,   R     12/06/18 1733   12/07/18 0500  TSH  Tomorrow morning,   R     12/06/18 1736   12/06/18 1729  Novel Coronavirus, NAA (hospital order; send-out to ref lab)  ([facility] IP COVID ADMIT NOVEL CORONAVIRUS, NAA (HOSPITAL ORDER; SEND-OUT  TO REF LAB))  Add-on,   R    Question Answer Comment  Current symptoms Fever and Cough   Excluded other viral illnesses Yes   Exposure Risk Traveled to high risk areas in the last 14 days   Patient immune status Normal      12/06/18 1733   12/06/18 1621  Hepatitis panel, acute  ONCE - STAT,   STAT     12/06/18 1620   12/06/18 1418  Blood Culture (routine x 2)   BLOOD CULTURE X 2,   STAT     12/06/18 1421          Vitals/Pain Today's Vitals   12/06/18 1953 12/06/18 2000 12/06/18 2030 12/06/18 2102  BP: 111/72 129/84 126/85 120/77  Pulse: (!) 116 (!) 128 (!) 118 (!) 125  Resp: 17 16 20  (!) 23  Temp:      TempSrc:      SpO2: 100% 99% 100% 100%  Weight:      Height:      PainSc:        Isolation Precautions Droplet and Contact precautions  Medications Medications  0.9 %  sodium chloride infusion ( Intravenous Rate/Dose Verify 12/06/18 1641)  enoxaparin (LOVENOX) injection 40 mg (has no administration in time range)  0.9 %  sodium chloride infusion ( Intravenous New Bag/Given 12/06/18 2009)  acetaminophen (TYLENOL) tablet 650 mg (has no administration in time range)  hydrocortisone (CORTEF) tablet 10 mg (has no administration in time range)  labetalol (NORMODYNE,TRANDATE) injection 5 mg (has no administration in time range)  levothyroxine (SYNTHROID, LEVOTHROID) injection 37.5 mcg (has no administration in time range)  albuterol (PROVENTIL HFA;VENTOLIN HFA) 108 (90 Base) MCG/ACT inhaler 1-2 puff (has no administration in time range)  ondansetron (ZOFRAN) injection 4 mg (has no administration in time range)  hydrocortisone (CORTEF) tablet 20 mg (has no administration in time range)  hydrocortisone sodium succinate (SOLU-CORTEF) 100 MG injection 100 mg (100 mg Intravenous Given 12/06/18 1438)  acetaminophen (TYLENOL) suppository 650 mg (650 mg Rectal Given 12/06/18 1521)  ceFEPIme (MAXIPIME) 2 g in sodium chloride 0.9 % 100 mL IVPB (0 g Intravenous Stopped 12/06/18 2010)  metroNIDAZOLE (FLAGYL) IVPB 500 mg (0 mg Intravenous Stopped 12/06/18 2010)  potassium chloride SA (K-DUR,KLOR-CON) CR tablet 40 mEq (40 mEq Oral Given 12/06/18 2008)    Mobility walks High fall risk   Focused Assessments Septic and renal and respiratory compromised admit diagnoses     Recommendations: See Admitting Provider Note  Report given to:   Additional  Notes:  Unknown organism

## 2018-12-06 NOTE — ED Notes (Signed)
[facility]ed [facility] report given  Called [facility]link request made

## 2018-12-06 NOTE — ED Notes (Signed)
BLOOD CULTURE X 2 OBTAINED BY LOGAN RN

## 2018-12-06 NOTE — ED Notes (Signed)
ATTEMPTED FOR SECOND LACTIC. PT HAS TWO ULTRA SOUND IV ACCESS AND #22 IV AND WAS UNABLE TO OBTAIN. WILL REQUEST ASSISTANCE.

## 2018-12-06 NOTE — ED Notes (Signed)
ADMISSION MD PRESENT. INCREASE CURRENT NS BOLUS FROM 100CC/HR TO 999/HR AND RUN ADDITIONAL NS BOLUS FOR TOTAL 2 LITER NS NOW

## 2018-12-06 NOTE — H&P (Addendum)
History and Physical    AMNEET CENDEJAS MWU:[id number] DOB: [birth year] DOA: 12/06/2018  PCP: Lanae Boast, FNP  Patient coming from: [facility])  Chief Complaint: N/V/D/sob/muscle weakness  HPI: [name] is a 63 y.o. female with medical history significant of adrenal insufficiency on chronic steroid use, asthme, HTN, HLD, hypothyroid, arthritis who presented to the ED with complaints of 4 days hx of n/v/d, fever, being unable to tolerate PO meds and only limited PO intake. Of note, patient is a poor historian. On further questioning, pt did later admit to muscle weakness, sob, and "cold like" symptoms during this time period as well. Pt reports living with extended family locally for 5 months, later moving with husband to local motel two months prior to ED visit. Patient denies sick contacts, other travel hx. Pt reports all other family members are in seemingly good health.   ED Course: In the ED, pt noted to be hypoxemic, briefly requiring up to NRB, although improved quickly to [facility]. CXR noted to be clear. Sodium 132, Cr 1.4 with baseline Cr <1. WBC of 9.6. Flu was neg. Pt was given total of 2LNC NS bolus and Hospitalist consulted for consideration for medical admission.  Review of Systems:  Review of Systems  Constitutional: Positive for fever and malaise/fatigue.  HENT: Negative for ear discharge, ear pain, nosebleeds, sore throat and tinnitus.   Eyes: Negative for double vision, photophobia and pain.  Respiratory: Positive for shortness of breath. Negative for hemoptysis and wheezing.   Cardiovascular: Negative for palpitations and orthopnea.  Gastrointestinal: Positive for diarrhea, nausea and vomiting.  Genitourinary: Negative for frequency, hematuria and urgency.  Musculoskeletal: Negative for falls, joint pain and neck pain.  Neurological: Negative for tingling, tremors, seizures, loss of consciousness and weakness.  Psychiatric/Behavioral: Negative for  hallucinations, memory loss and substance abuse.    Past Medical History:  Diagnosis Date  . 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[place])   . Amblyopia   . Anemia   . Arthritis    "in my lower back"  . Asthma   . Atrial myxoma   . Blindness of left eye 1978  . Blood transfusion   . Breast cyst   . Constipation   . 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 CVA (cerebral infarction)   . Depression   . Depression   . 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[place])   . Hemorrhoids   . Hyperlipemia   . Hypothyroidism    per office note dated 03/25/2014  . Migraine   . Peptic ulcer associated with Helicobacter pylori infection   . Reflux   . Sinus headache   . Stomach ulcer    "from the hydrocortisone"  . Stroke [facility]) 1978   "piece tumor broke off & went to left eye; leaving me blind"  . Thyroid disease    pt unsure if hyper or hypo???    Past Surgical History:  Procedure Laterality Date  . ADRENAL GLAND SURGERY  1982   on hydrocortisone  . ATRIAL MYXOMA EXCISION  1978  . BREAST BIOPSY  07/1999   ductal tissue excision & bx right; bx only left breast  . EYE SURGERY    . LEFT HEART CATHETERIZATION WITH CORONARY ANGIOGRAM N/A 10/06/2011   Procedure: LEFT HEART CATHETERIZATION WITH CORONARY ANGIOGRAM;  Surgeon: Clent Demark, MD;  Location: [facility] CATH LAB;  Service: Cardiovascular;  Laterality: N/A;  . repaired cross eyed     "as a child"  . ROTATOR CUFF REPAIR  05/2005   left  . VAGINAL  HYSTERECTOMY  2000   "partial; w/left ovary"     reports that she has never smoked. She has never used smokeless tobacco. She reports that she does not drink alcohol or use drugs.  Allergies  Allergen Reactions  . Aspirin Other (See Comments)    Can take baby aspirin (heart flutters with high doses)  . Ibuprofen [Ibuprofen] Nausea And Vomiting  . Penicillins Nausea And Vomiting    Has patient had a PCN reaction causing immediate rash, facial/tongue/throat swelling, SOB or lightheadedness with hypotension: Yes Has patient had a  PCN reaction causing severe rash involving mucus membranes or skin necrosis: No Has patient had a PCN reaction that required hospitalization No Has patient had a PCN reaction occurring within the last 10 years: No If all of the above answers are "NO", then may proceed with Cephalosporin use.     Family History  Problem Relation Age of Onset  . Colon cancer Sister 91  . Acromegaly Father   . Heart disease Father        Tumor in the heart  . Stomach cancer Father   . Diabetes Sister   . Kidney disease Sister        x2  . Heart disease Sister        Tumor in the heart  . Heart disease Daughter        Tumor in the heart  . Colon polyps Neg Hx   . Esophageal cancer Neg Hx   . Gallbladder disease Neg Hx     Prior to Admission medications   Medication Sig Start Date End Date Taking? Authorizing Provider  aspirin EC 81 MG tablet Take 81 mg by mouth every morning.   Yes [provider]  fenofibrate (TRICOR) 48 MG tablet Take 1 tablet (48 mg total) by mouth daily. 10/03/18  Yes Lanae Boast, FNP  hydrocortisone (CORTEF) 20 MG tablet Take 0.5-1 tablets (10-20 mg total) by mouth 2 (two) times daily. Take 1 tablet every morning and 1/2 tablet every evening Patient taking differently: Take 20 mg by mouth 2 (two) times daily.  04/06/19  Yes Delora Fuel, MD  levothyroxine (SYNTHROID, LEVOTHROID) 75 MCG tablet Take 1 tablet (75 mcg total) by mouth daily. 3/55/97   Delora Fuel, MD  metoprolol tartrate (LOPRESSOR) 25 MG tablet Take 0.5 tablets (12.5 mg total) by mouth 2 (two) times daily. Patient taking differently: Take 25 mg by mouth daily.  12/25/36   Delora Fuel, MD  omega-3 acid ethyl esters (LOVAZA) 1 g capsule Take 1 capsule (1 g total) by mouth 2 (two) times daily. 10/03/18   Lanae Boast, FNP  omeprazole (PRILOSEC) 20 MG capsule Take 1 capsule (20 mg total) by mouth daily. 09/27/18   Lanae Boast, FNP  rosuvastatin (CRESTOR) 10 MG tablet Take 10 mg by mouth daily. 09/24/18    [provider]  traZODone (DESYREL) 50 MG tablet Take 0.5-1 tablets (25-50 mg total) by mouth at bedtime as needed for sleep. 09/27/18   Lanae Boast, FNP  venlafaxine XR (EFFEXOR-XR) 150 MG 24 hr capsule Take 1 capsule (150 mg total) by mouth daily. 09/27/18   Lanae Boast, FNP    Physical Exam: Vitals:   12/06/18 1538 12/06/18 1600 12/06/18 1707 12/06/18 1730  BP: 116/76 107/67 104/79 120/78  Pulse: (!) 124 (!) 129 (!) 125 (!) 118  Resp: 19 (!) 22 14 (!) 21  Temp:   (!) 100.6 F (38.1 C)   TempSrc:   Rectal   SpO2: 98% 99%  99% 100%  Weight:      Height:        Constitutional: NAD, calm, comfortable Vitals:   12/06/18 1538 12/06/18 1600 12/06/18 1707 12/06/18 1730  BP: 116/76 107/67 104/79 120/78  Pulse: (!) 124 (!) 129 (!) 125 (!) 118  Resp: 19 (!) 22 14 (!) 21  Temp:   (!) 100.6 F (38.1 C)   TempSrc:   Rectal   SpO2: 98% 99% 99% 100%  Weight:      Height:       Eyes: PERRL, lids and conjunctivae normal ENMT: dry mucus membranes, pupils equal B, dentition fair Neck: normal, supple, no masses, no thyromegaly Respiratory: clear to auscultation bilaterally, no audible wheezing, Normal respiratory effort. No accessory muscle use.  Cardiovascular: tachycardic, s1, s2  Abdomen: no tenderness, no masses palpated. No hepatosplenomegaly. Bowel sounds positive.  Musculoskeletal: no clubbing / cyanosis. No joint deformity upper and lower extremities. Good ROM, no contractures. Normal muscle tone.  Skin: no rashes, lesions, ulcers. No induration Neurologic: CN 2-12 grossly intact. Sensation intact, strength 5/5 in all 4.  Psychiatric: Normal judgment and insight. Alert and oriented x 3. Normal mood.    Labs on Admission: I have personally reviewed following labs and imaging studies  CBC: Recent Labs  Lab 12/06/18 1427 12/06/18 1539  WBC 9.6  --   NEUTROABS 3.8  --   HGB 15.3* 16.3*  HCT 47.2* 48.0*  MCV 91.1  --   PLT 367  --    Basic Metabolic Panel:  Recent Labs  Lab 12/06/18 1427 12/06/18 1539  NA 132* 132*  K 3.7 3.4*  CL 95*  --   CO2 19*  --   GLUCOSE 82  --   BUN 18  --   CREATININE 1.53* 1.40*  CALCIUM 10.3  --    GFR: Estimated Creatinine Clearance: 38.3 mL/min (A) (by C-G formula based on SCr of 1.4 mg/dL (H)). Liver Function Tests: Recent Labs  Lab 12/06/18 1427  AST 110*  ALT 76*  ALKPHOS 66  BILITOT 1.5*  PROT 8.1  ALBUMIN 3.9   No results for input(s): LIPASE, AMYLASE in the last 168 hours. No results for input(s): AMMONIA in the last 168 hours. Coagulation Profile: No results for input(s): INR, PROTIME in the last 168 hours. Cardiac Enzymes: No results for input(s): CKTOTAL, CKMB, CKMBINDEX, TROPONINI in the last 168 hours. BNP (last 3 results) No results for input(s): PROBNP in the last 8760 hours. HbA1C: No results for input(s): HGBA1C in the last 72 hours. CBG: Recent Labs  Lab 12/06/18 1421  GLUCAP 85   Lipid Profile: No results for input(s): CHOL, HDL, LDLCALC, TRIG, CHOLHDL, LDLDIRECT in the last 72 hours. Thyroid Function Tests: No results for input(s): TSH, T4TOTAL, FREET4, T3FREE, THYROIDAB in the last 72 hours. Anemia Panel: No results for input(s): VITAMINB12, FOLATE, FERRITIN, TIBC, IRON, RETICCTPCT in the last 72 hours. Urine analysis:    Component Value Date/Time   COLORURINE YELLOW 04/16/2016 1800   APPEARANCEUR CLEAR 04/16/2016 1800   LABSPEC 1.020 04/16/2016 1800   PHURINE 5.5 04/16/2016 1800   GLUCOSEU NEGATIVE 04/16/2016 1800   HGBUR NEGATIVE 04/16/2016 1800   BILIRUBINUR neg 09/27/2018 1013   KETONESUR NEGATIVE 04/16/2016 1800   PROTEINUR Negative 09/27/2018 1013   PROTEINUR NEGATIVE 04/16/2016 1800   UROBILINOGEN 1.0 09/27/2018 1013   UROBILINOGEN 1.0 11/02/2013 1127   NITRITE neg 09/27/2018 1013   NITRITE NEGATIVE 04/16/2016 1800   LEUKOCYTESUR Small (1+) (A) 09/27/2018 1013   Sepsis  Labs: !!!!!!!!!!!!!!!!!!!!!!!!!!!!!!!!!!!!!!!!!!!!  @LABRCNTIP (procalcitonin:4,lacticidven:4) )No results found for this or any previous visit (from the past 240 hour(s)).   Radiological Exams on Admission: Dg Chest Portable 1 View  Result Date: 12/06/2018 CLINICAL DATA:  Cough and shortness of breath EXAM: PORTABLE CHEST 1 VIEW COMPARISON:  August 11, 2017 FINDINGS: Lungs are clear. Heart size and pulmonary vascularity are normal. No adenopathy. Patient is status post median sternotomy. No bone lesions. IMPRESSION: No edema or consolidation. Electronically Signed   By: Lowella Grip III M.D.   On: 12/06/2018 15:50    EKG: Independently reviewed. Sinus tach  Assessment/Plan Principal Problem:   ARF (acute renal failure) (HCC) Active Problems:   Nausea & vomiting   Hypothyroidism   Fever   1. Acute hypoxemic respiratory failure with fever 1. Flu neg 2. CXR personally reviewed, clear 3. Pt currently weaned to [facility] and breathing comfortably with O2 sat of 100% (peak O2 requirement of NRB briefly in ED) 4. Patient reports living with extended family locally for 5 months, moving to local motel with husband 2 months prior to visit. Denies sick contacts and claims to stay indoors majority of time 5. Will check RVP and COVID 6. Low risk as pt does not currently require nebulized tx, intubation, bipap 7. PPE worn when pt was seen, including appropriately fitting N95 mask, shield, gown, gloves. PPE was put on and taken off per policy 2. N/V/D 1. Unclear etiology - question viral gastroenteritis 2. Pt noted acute onset x 4 days prior to admit with no sick contacts 3. Will cont on clears and advance as tolerated 4. Cont on IV beta blocker and IV synthroid per below until pt can reliably tolerate PO 3. Hypothyroid 1. Pt unable to tolerate PO meds x 4 days 2. Will continue on IV synthroid for now 3. Will check TSH 4. Sinus tach 1. Clinically dehydrated on exam 2. Will continue with aggressive IVF hydration as tolerated 3. Cont on  monitor 4. PRN IV labetalol for HR>120. Would resume home metoprolol when/if patient's BP would allow 5. HTN 1. BP soft, albeit stable 2. Hold scheduled beta blocker until BP improves 6. ARF 1. Suspect pre-renal etiology given active n/v/d and poor PO intake 2. IVF hydration per above 3. Repeat bmet in AM 4. Avoid nephrotoxic agents 7. Hx adrenal insufficiency 1. Will continue with hydrocortisone per home regimen as tolerated 8. Hypokalemia 1. Will replace 2. Repeat bmet in AM  DVT prophylaxis: lovenox subQ  Code Status: Full Family Communication: Pt in room, family not at bedside  Disposition Plan: Uncertain at this time  Consults called:  Admission status: inpatient as would likely require greater than 2 midnight stay to treat febrile illness and needing IVF hydration to correct renal insufficiency  Marylu Lund MD [facility] Pager On Amion  If 7PM-7AM, please contact night-coverage  12/06/2018, 5:38 PM

## 2018-12-06 NOTE — ED Provider Notes (Signed)
[facility] DEPT Provider Note   CSN: [id number] Arrival date & time: 12/06/18  1350    History   Chief Complaint Chief Complaint  Patient presents with   Fever   Weakness   Cough   Nausea   Chills    HPI [name] is a 63 y.o. female with history of atrial myxoma, Cushing syndrome, CVA, hypertension, hyperlipidemia presents for evaluation of acute onset, progressively worsening fever, cough, nausea, vomiting, diarrhea, headaches for 4 days.  Reports 3-4 episodes of nonbloody nonbilious emesis daily and a few episodes of watery nonbloody diarrhea.  Denies abdominal pain.  Notes urinary frequency but denies dysuria or hematuria.  She denies shortness of breath.  She reports cough productive of orange sputum.  She denied fever to me but per EMS report she has been having fevers.  She apparently lives in a hotel and upon EMS arrival she was lethargic with a CBG of 61.  She was given D10 via EMS with improvement in her CBG.  She reports she has not been able to keep down any of her medications.  Also notes frontal headache with photophobia but reports history of migraine headaches.  Denies recent travel or known sick contacts.  She reports feeling generally weak.  She is a non-smoker. She is full code.     The history is provided by the patient.    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    Patient Active Problem List   Diagnosis Date Noted   ARF (acute renal failure) ([place]) 12/06/2018   Fever 12/06/2018   Acute hypoxemic respiratory failure ([place]) 12/06/2018   Headache 11/02/2013   Dizziness 11/02/2013   Anemia 01/10/2013   Chest pain, unspecified 01/10/2013   Adjustment disorder with mixed anxiety and depressed mood 01/10/2013   Adrenal crisis ([place]) 01/07/2013   Nausea & vomiting 08/03/2011   Hypotension 08/03/2011   Adrenal insufficiency ([place]) 08/03/2011   Hyponatremia 08/03/2011   Hypothyroidism 08/03/2011   Depression 08/03/2011    Past Surgical History:  Procedure Laterality Date   ADRENAL GLAND SURGERY  1982   on hydrocortisone   [place]  07/1999   ductal tissue excision & bx right; bx only left breast   EYE SURGERY     LEFT HEART CATHETERIZATION WITH CORONARY ANGIOGRAM N/A 10/06/2011   Procedure: LEFT HEART CATHETERIZATION WITH CORONARY ANGIOGRAM;  Surgeon: Clent Demark, MD;  Location: [facility] CATH LAB;  Service: Cardiovascular;  Laterality: N/A;   repaired cross eyed     "  as a child"   ROTATOR CUFF REPAIR  05/2005   left   VAGINAL HYSTERECTOMY  2000   "partial; w/left ovary"     OB History   No obstetric history on file.      Home Medications    Prior to Admission medications   Medication Sig Start Date End Date Taking? Authorizing Provider  aspirin EC 81 MG tablet Take 81 mg by mouth every morning.   Yes [provider]  fenofibrate (TRICOR) 48 MG tablet Take 1 tablet (48 mg total) by mouth daily. 10/03/18  Yes Lanae Boast, FNP  hydrocortisone (CORTEF) 20 MG tablet Take 0.5-1 tablets (10-20 mg total) by mouth 2 (two) times daily. Take 1 tablet every morning and 1/2 tablet every evening Patient taking differently:  Take 20 mg by mouth 2 (two) times daily.  06/04/25  Yes Delora Fuel, MD  levothyroxine (SYNTHROID, LEVOTHROID) 75 MCG tablet Take 1 tablet (75 mcg total) by mouth daily. 8/34/19   Delora Fuel, MD  metoprolol tartrate (LOPRESSOR) 25 MG tablet Take 0.5 tablets (12.5 mg total) by mouth 2 (two) times daily. Patient taking differently: Take 25 mg by mouth daily.  03/02/28   Delora Fuel, MD  omega-3 acid ethyl esters (LOVAZA) 1 g capsule Take 1 capsule (1 g total) by mouth 2 (two) times daily. 10/03/18   Lanae Boast, FNP  omeprazole (PRILOSEC) 20 MG capsule Take 1 capsule (20 mg total) by mouth daily. 09/27/18   Lanae Boast, FNP  rosuvastatin (CRESTOR) 10 MG tablet Take 10 mg by mouth daily. 09/24/18   [provider]  traZODone (DESYREL) 50 MG tablet Take 0.5-1 tablets (25-50 mg total) by mouth at bedtime as needed for sleep. 09/27/18   Lanae Boast, FNP  venlafaxine XR (EFFEXOR-XR) 150 MG 24 hr capsule Take 1 capsule (150 mg total) by mouth daily. 09/27/18   Lanae Boast, FNP    Family History Family History  Problem Relation Age of Onset   Colon cancer Sister 35   Acromegaly Father    Heart disease Father        Tumor in the heart   Stomach cancer Father    Diabetes Sister    Kidney disease Sister        x2   Heart disease Sister        Tumor in the heart   Heart disease Daughter        Tumor in the heart   Colon polyps Neg Hx    Esophageal cancer Neg Hx    Gallbladder disease Neg Hx     Social History Social History   Tobacco Use   Smoking status: Never Smoker   Smokeless tobacco: Never Used  Substance Use Topics   Alcohol use: No   Drug use: No     Allergies   Aspirin; Ibuprofen [ibuprofen]; and Penicillins   Review of Systems Review of Systems  Constitutional: Positive for fever.  Respiratory: Positive for shortness of breath.   Cardiovascular: Negative for chest pain and leg swelling.  Gastrointestinal: Positive for diarrhea, nausea  and vomiting. Negative for abdominal pain.  Genitourinary: Positive for frequency. Negative for dysuria, hematuria and urgency.  Neurological: Positive for weakness (generalized).  All other systems reviewed and are negative.    Physical Exam Updated Vital Signs BP 120/78    Pulse (!) 118    Temp (!) 100.6 F (38.1 C) (Rectal)    Resp (!) 21    Ht 4\' 11"  (1.499 m)  Wt 77.1 kg    SpO2 100%    BMI 34.34 kg/m   Physical Exam Vitals signs and nursing note reviewed.  Constitutional:      General: She is not in acute distress.    Appearance: She is well-developed. She is ill-appearing.     Comments: Appears drowsy but easily arousable  HENT:     Head: Normocephalic and atraumatic.     Mouth/Throat:     Mouth: Mucous membranes are dry.  Eyes:     General:        Right eye: No discharge.        Left eye: No discharge.     Conjunctiva/sclera: Conjunctivae normal.  Neck:     Vascular: No JVD.     Trachea: No tracheal deviation.  Cardiovascular:     Rate and Rhythm: Regular rhythm. Tachycardia present.     Pulses: Normal pulses.     Comments: 2+ radial and DP/PT pulses bilaterally, no lower extremity edema  Pulmonary:     Comments: Globally diminished breath sounds.  SPO2 saturations down to 74% on 2 L via nasal cannula.  Improved to 100% on nonrebreather. Abdominal:     General: Bowel sounds are normal. There is no distension.     Palpations: Abdomen is soft.     Tenderness: There is no abdominal tenderness. There is no guarding or rebound.  Skin:    General: Skin is warm and dry.     Findings: No erythema.  Neurological:     Mental Status: She is alert.     Comments: Oriented to person place and month but not day of the week.  No dysarthria or aphasia noted.  No facial droop.  Cranial nerves II through XII appear grossly intact.  She is moving all extremities spontaneously with intact strength.  Psychiatric:        Behavior: Behavior normal.      ED Treatments / Results   Labs (all labs ordered are listed, but only abnormal results are displayed) Labs Reviewed  LACTIC ACID, PLASMA - Abnormal; Notable for the following components:      Result Value   Lactic Acid, Venous 2.1 (*)    All other components within normal limits  COMPREHENSIVE METABOLIC PANEL - Abnormal; Notable for the following components:   Sodium 132 (*)    Chloride 95 (*)    CO2 19 (*)    Creatinine, Ser 1.53 (*)    AST 110 (*)    ALT 76 (*)    Total Bilirubin 1.5 (*)    GFR calc non Af Amer 37 (*)    GFR calc Af Amer 42 (*)    Anion gap 18 (*)    All other components within normal limits  CBC WITH DIFFERENTIAL/PLATELET - Abnormal; Notable for the following components:   RBC 5.18 (*)    Hemoglobin 15.3 (*)    HCT 47.2 (*)    Monocytes Absolute 1.5 (*)    Eosinophils Absolute 1.4 (*)    All other components within normal limits  BLOOD GAS, VENOUS - Abnormal; Notable for the following components:   pH, Ven 7.241 (*)    Acid-base deficit 5.7 (*)    All other components within normal limits  I-STAT CREATININE, ED - Abnormal; Notable for the following components:   Creatinine, Ser 1.40 (*)    All other components within normal limits  POCT I-STAT EG7 - Abnormal; Notable for the following components:   pCO2, Ven 36.2 (*)  pO2, Ven 50.0 (*)    Bicarbonate 19.0 (*)    TCO2 20 (*)    Acid-base deficit 6.0 (*)    Sodium 132 (*)    Potassium 3.4 (*)    HCT 48.0 (*)    Hemoglobin 16.3 (*)    All other components within normal limits  CULTURE, BLOOD (ROUTINE X 2)  CULTURE, BLOOD (ROUTINE X 2)  NOVEL CORONAVIRUS, NAA (HOSPITAL ORDER, SEND-OUT TO REF LAB)  RESPIRATORY PANEL BY PCR  INFLUENZA PANEL BY PCR (TYPE A & B)  LACTIC ACID, PLASMA  URINALYSIS, ROUTINE W REFLEX MICROSCOPIC  HEPATITIS PANEL, ACUTE  CBC  CREATININE, SERUM  PROCALCITONIN  FERRITIN  LACTATE DEHYDROGENASE  COMPREHENSIVE METABOLIC PANEL  CBC WITH DIFFERENTIAL/PLATELET  TSH  CBG MONITORING, ED  CBG  MONITORING, ED  I-STAT CREATININE, ED    EKG EKG Interpretation  Date/Time:  Friday December 06 2018 14:20:10 EDT Ventricular Rate:  129 PR Interval:    QRS Duration: 80 QT Interval:  313 QTC Calculation: 459 R Axis:   93 Text Interpretation:  Sinus tachycardia Multiform ventricular premature complexes Right axis deviation Nonspecific T abnormalities, anterior leads Confirmed by Quintella Reichert [phone]) on 12/06/2018 2:26:17 PM Also confirmed by Quintella Reichert [phone]), editor Philomena Doheny [phone])  on 12/06/2018 2:27:05 PM   Radiology Dg Chest Portable 1 View  Result Date: 12/06/2018 CLINICAL DATA:  Cough and shortness of breath EXAM: PORTABLE CHEST 1 VIEW COMPARISON:  August 11, 2017 FINDINGS: Lungs are clear. Heart size and pulmonary vascularity are normal. No adenopathy. Patient is status post median sternotomy. No bone lesions. IMPRESSION: No edema or consolidation. Electronically Signed   By: Lowella Grip III M.D.   On: 12/06/2018 15:50    Procedures .Critical Care Performed by: Renita Papa, PA-C Authorized by: Renita Papa, PA-C   Critical care provider statement:    Critical care time (minutes):  40   Critical care was necessary to treat or prevent imminent or life-threatening deterioration of the following conditions:  Sepsis and respiratory failure   Critical care was time spent personally by me on the following activities:  Discussions with consultants, evaluation of patient's response to treatment, examination of patient, ordering and performing treatments and interventions, ordering and review of laboratory studies, ordering and review of radiographic studies, pulse oximetry, re-evaluation of patient's condition, obtaining history from patient or surrogate and review of old charts   (including critical care time)  Medications Ordered in ED Medications  0.9 %  sodium chloride infusion ( Intravenous Rate/Dose Verify 12/06/18 1641)  enoxaparin (LOVENOX) injection 40  mg (has no administration in time range)  0.9 %  sodium chloride infusion (has no administration in time range)  acetaminophen (TYLENOL) tablet 650 mg (has no administration in time range)  hydrocortisone (CORTEF) tablet 20 mg (has no administration in time range)  labetalol (NORMODYNE,TRANDATE) injection 5 mg (has no administration in time range)  levothyroxine (SYNTHROID, LEVOTHROID) injection 37.5 mcg (has no administration in time range)  albuterol (PROVENTIL HFA;VENTOLIN HFA) 108 (90 Base) MCG/ACT inhaler 1-2 puff (has no administration in time range)  ondansetron (ZOFRAN) injection 4 mg (has no administration in time range)  potassium chloride SA (K-DUR,KLOR-CON) CR tablet 40 mEq (has no administration in time range)  hydrocortisone sodium succinate (SOLU-CORTEF) 100 MG injection 100 mg (100 mg Intravenous Given 12/06/18 1438)  acetaminophen (TYLENOL) suppository 650 mg (650 mg Rectal Given 12/06/18 1521)  ceFEPIme (MAXIPIME) 2 g in sodium chloride 0.9 % 100 mL IVPB (2 g Intravenous  New Bag/Given (Non-Interop) 12/06/18 1642)  metroNIDAZOLE (FLAGYL) IVPB 500 mg (500 mg Intravenous New Bag/Given (Non-Interop) 12/06/18 1700)     Initial Impression / Assessment and Plan / ED Course  I have reviewed the triage vital signs and the nursing notes.  Pertinent labs & imaging results that were available during my care of the patient were reviewed by me and considered in my medical decision making (see chart for details).        Patient presenting brought in by EMS for nausea vomiting diarrhea, cough, fevers, shortness of breath.  Found to be febrile, tachycardic, and hypoxic in the ED.  Somewhat lethargic.  Chest x-ray clear.  EKG shows sinus tachycardia.  Lab work significant for mildly elevated lactate of 2.1.  Code sepsis was initiated and she was given broad-spectrum antibiotics.  Mild bump in creatinine and she appears hemoconcentrated with elevated hemoglobin and hematocrit.  I suspect she is  dehydrated.  VBG shows mild acidosis.  With the decreased bicarb and elevated anion gap.  CBG within normal limits.  LFTs appear acutely elevated.  She initially required supplemental oxygen via nonrebreather but was able to be weaned to a nasal cannula at 3 L/min.  Flu is a test negative.  With concern for possible COVID-19 infection and stable blood pressure, she was given fluids cautiously.  Spoke with Dr. Wyline Copas with [facility] hospitalist service who agrees to assume care of patient and bring him to the hospital for further evaluation and management.  Patient seen and evaluate Dr. Ralene Bathe who agrees with assessment and plan at this time.  [name] was evaluated in Emergency Department on 12/06/2018 for the symptoms described in the history of present illness. She was evaluated in the context of the global COVID-19 pandemic, which necessitated consideration that the patient might be at risk for infection with the SARS-CoV-2 virus that causes COVID-19. Institutional protocols and algorithms that pertain to the evaluation of patients at risk for COVID-19 are in a state of rapid change based on information released by regulatory bodies including the CDC and federal and state organizations. These policies and algorithms were followed during the patient's care in the ED.   Final Clinical Impressions(s) / ED Diagnoses   Final diagnoses:  Sepsis with acute hypoxic respiratory failure without septic shock, due to unspecified organism [facility])    ED Discharge Orders    None       Debroah Baller 12/06/18 1819    Quintella Reichert, MD 12/09/18 [phone]

## 2018-12-06 NOTE — ED Notes (Addendum)
CHRIS Conely (SON) [phone]

## 2018-12-06 NOTE — ED Notes (Signed)
Called main lab phlebotomy and requested assistance with blood draw.

## 2018-12-06 NOTE — ED Notes (Signed)
MAIN LAB [facility]. CALLED FOR ASSISTANCE FOR LACTIC AND ADDITIONAL ADMISSION BLOOD DRAW FOR THIS PT. CALLED AND AWARE OF REQUEST.

## 2018-12-07 DIAGNOSIS — A419 Sepsis, unspecified organism: Secondary | ICD-10-CM

## 2018-12-07 DIAGNOSIS — N179 Acute kidney failure, unspecified: Principal | ICD-10-CM

## 2018-12-07 DIAGNOSIS — E2740 Unspecified adrenocortical insufficiency: Secondary | ICD-10-CM

## 2018-12-07 MED ORDER — SODIUM CHLORIDE 0.9 % IV SOLN
2.0000 g | INTRAVENOUS | Status: DC
  Administered 2018-12-07: 2 g via INTRAVENOUS
  Filled 2018-12-07 (×2): qty 2

## 2018-12-07 MED ORDER — VANCOMYCIN HCL 10 G IV SOLR
1500.0000 mg | Freq: Once | INTRAVENOUS | Status: AC
  Administered 2018-12-07: 1500 mg via INTRAVENOUS
  Filled 2018-12-07: qty 1500

## 2018-12-07 MED ORDER — METOPROLOL TARTRATE 25 MG PO TABS
25.0000 mg | ORAL_TABLET | Freq: Every day | ORAL | Status: DC
  Administered 2018-12-07 – 2018-12-09 (×3): 25 mg via ORAL
  Filled 2018-12-07 (×3): qty 1

## 2018-12-07 MED ORDER — VANCOMYCIN HCL IN DEXTROSE 750-5 MG/150ML-% IV SOLN: 750.0000 mg | INTRAVENOUS | Status: DC

## 2018-12-07 MED ORDER — METRONIDAZOLE IN NACL 5-0.79 MG/ML-% IV SOLN
500.0000 mg | Freq: Three times a day (TID) | INTRAVENOUS | Status: DC
  Administered 2018-12-07 – 2018-12-08 (×4): 500 mg via INTRAVENOUS
  Filled 2018-12-07 (×4): qty 100

## 2018-12-07 MED ORDER — HYDROCORTISONE NA SUCCINATE PF 100 MG IJ SOLR
50.0000 mg | Freq: Four times a day (QID) | INTRAMUSCULAR | Status: DC
  Administered 2018-12-07 – 2018-12-08 (×5): 50 mg via INTRAVENOUS
  Filled 2018-12-07 (×5): qty 2

## 2018-12-07 NOTE — Progress Notes (Signed)
Reiceived via EMS to room 2W38. Assisted to bed and positioned for comfort. Oriented to room, bed and unit. In no acute respiratory distress.

## 2018-12-07 NOTE — Progress Notes (Signed)
PROGRESS NOTE    [name]  KGM:[id number] DOB: [birth year] DOA: 12/06/2018 PCP: Lanae Boast, FNP      Brief Narrative:  [name] is a 63 y.o. M with hx asthma, migraines, secondary adrenal insufficiency, hypertension, hypothyroidism, and history of CVA resents with malaise, N/V/D, and fever for 5 days.  In the ER, the patient was hypoxic requiring NRB, chest x-ray clear, mild AKI, flu negative.  Given IV fluids and hospitalist asked to evaluate for fever and hypoxia, no clear source.      Assessment & Plan:  Possible sepsis Source unclear.  Presents with fever, tachycardia, WBC normal, lactate 2.1.  CXR and UA normal.  Blood cultures pending.   -Continue empiric broad spectrum antibiotics with vancomycin, cefepime, and Flagyl - wean O2 as able     Transaminitis Unclear cause -Trend LFTs  Anion gap metabolic acidosis Likely lactic acidosis, mild, not DKA. -Trend BMP  Hypothyroidism -Continue levothyroxine  Adrenal insufficiency -Stress dose steroids  Asthma No wheezing  CV secondary prevention Hypertension -Continue home metoprolol - Restart home medications when reconciled  Hyponatremia Mild, asymptomatic  Acute kidney injury Baseline creatinine 1, creatinine at admission 1.5 mg/dL.  Minimal improvement with IV fluids. -Check urinalysis and urine electrolytes         MDM and disposition: The below labs and imaging reports were reviewed and summarized above.  Medication management as above.  The patient was admitted with fever, hypoxia and elevated lactate.  At rpesent, she appears to have sepsis unclear source.            DVT prophylaxis: Lovenox Code Status: FULL Family Communication: None present    Consultants:   None  Procedures:   None  Antimicrobials:   Vancomycni  3/27 >>  Cefepime 3/27 >>  Flagyl 3/27 >>    Subjective: Feels achy, malaise, tired.  No cough, dyspnea.  No vomiting today.  No  abdominal pain, flank pain.  No dysuria.        Objective: Vitals:   12/07/18 0243 12/07/18 0400 12/07/18 0730 12/07/18 0747  BP: 107/74 135/73  (!) 127/98  Pulse: (!) 119  (!) 108 (!) 119  Resp: 18  11 17   Temp:  98.1 F (36.7 C)  97.6 F (36.4 C)  TempSrc:  Oral  Oral  SpO2: 99% 94% 100% 100%  Weight:      Height:        Intake/Output Summary (Last 24 hours) at 12/07/2018 1226 Last data filed at 12/07/2018 0500 Gross per 24 hour  Intake 1440 ml  Output -  Net 1440 ml   Filed Weights   12/06/18 1503  Weight: 77.1 kg    Examination: General appearance:  adult female, alert and in no acute distress.   HEENT: Anicteric, conjunctiva pink, lids and lashes normal. Amblyopia noted.  No nasal deformity, discharge, epistaxis.  Lips moist, edentulous, op moist, no oral lesions, hearing normal.   Skin: Warm and dry.  No mottling. No suspicious rashes or lesions. Cardiac: Tachycardic, regular, nl S1-S2, no murmurs appreciated.  Capillary refill is brisk.  JVP normal.  No LE edema.  Radial pulses 2+ and symmetric. Respiratory: Normal respiratory rate and rhythm.  CTAB without rales or wheezes. Abdomen: Abdomen soft.  No TTP. No ascites, distension, hepatosplenomegaly.   MSK: No deformities or effusions. Neuro: Awake and alert.  EOMI, moves all extremities. Speech fluent.    Psych: Sensorium intact and responding to questions, attention normal. Affect norml.  Judgment and insight appear  normal.    Data Reviewed: I have personally reviewed following labs and imaging studies:  CBC: Recent Labs  Lab 12/06/18 1427 12/06/18 1539 12/06/18 1826  WBC 9.6  --  9.9  NEUTROABS 3.8  --   --   HGB 15.3* 16.3* 15.1*  HCT 47.2* 48.0* 48.7*  MCV 91.1  --  93.1  PLT 367  --  734   Basic Metabolic Panel: Recent Labs  Lab 12/06/18 1427 12/06/18 1539 12/06/18 1826  NA 132* 132*  --   K 3.7 3.4*  --   CL 95*  --   --   CO2 19*  --   --   GLUCOSE 82  --   --   BUN 18  --   --    CREATININE 1.53* 1.40* 1.45*  CALCIUM 10.3  --   --    GFR: Estimated Creatinine Clearance: 37 mL/min (A) (by C-G formula based on SCr of 1.45 mg/dL (H)). Liver Function Tests: Recent Labs  Lab 12/06/18 1427  AST 110*  ALT 76*  ALKPHOS 66  BILITOT 1.5*  PROT 8.1  ALBUMIN 3.9   No results for input(s): LIPASE, AMYLASE in the last 168 hours. No results for input(s): AMMONIA in the last 168 hours. Coagulation Profile: No results for input(s): INR, PROTIME in the last 168 hours. Cardiac Enzymes: No results for input(s): CKTOTAL, CKMB, CKMBINDEX, TROPONINI in the last 168 hours. BNP (last 3 results) No results for input(s): PROBNP in the last 8760 hours. HbA1C: No results for input(s): HGBA1C in the last 72 hours. CBG: Recent Labs  Lab 12/06/18 1421  GLUCAP 85   Lipid Profile: No results for input(s): CHOL, HDL, LDLCALC, TRIG, CHOLHDL, LDLDIRECT in the last 72 hours. Thyroid Function Tests: No results for input(s): TSH, T4TOTAL, FREET4, T3FREE, THYROIDAB in the last 72 hours. Anemia Panel: Recent Labs    12/06/18 1826  FERRITIN 528*   Urine analysis:    Component Value Date/Time   COLORURINE AMBER (A) 12/06/2018 1717   APPEARANCEUR HAZY (A) 12/06/2018 1717   LABSPEC 1.020 12/06/2018 1717   PHURINE 5.0 12/06/2018 1717   GLUCOSEU NEGATIVE 12/06/2018 1717   HGBUR SMALL (A) 12/06/2018 1717   BILIRUBINUR NEGATIVE 12/06/2018 1717   BILIRUBINUR neg 09/27/2018 1013   KETONESUR 80 (A) 12/06/2018 1717   PROTEINUR 100 (A) 12/06/2018 1717   UROBILINOGEN 1.0 09/27/2018 1013   UROBILINOGEN 1.0 11/02/2013 1127   NITRITE NEGATIVE 12/06/2018 1717   LEUKOCYTESUR NEGATIVE 12/06/2018 1717   Sepsis Labs: @LABRCNTIP (procalcitonin:4,lacticacidven:4)  ) Recent Results (from the past 240 hour(s))  Blood Culture (routine x 2)     Status: None (Preliminary result)   Collection Time: 12/06/18  2:27 PM  Result Value Ref Range Status   Specimen Description   Final    BLOOD LEFT  ANTECUBITAL Performed at [facility] [address], [place], [place] [address]    Special Requests   Final    BOTTLES DRAWN AEROBIC AND ANAEROBIC Blood Culture adequate volume Performed at [facility] [address], [place], [place] [address]    Culture   Final    NO GROWTH < 24 HOURS Performed at [facility] [address] [place],  [address]    Report Status PENDING  Incomplete  Blood Culture (routine x 2)     Status: None (Preliminary result)   Collection Time: 12/06/18  3:00 PM  Result Value Ref Range Status   Specimen Description   Final  BLOOD LEFT ANTECUBITAL Performed at [facility] [address] [place], [place] [address]    Special Requests   Final    BOTTLES DRAWN AEROBIC ONLY Blood Culture adequate volume Performed at [facility] [address] [place], [place] [address]    Culture   Final    NO GROWTH < 24 HOURS Performed at [facility] [address] [place], [place] [address]    Report Status PENDING  Incomplete  Respiratory Panel by PCR     Status: None   Collection Time: 12/06/18  3:02 PM  Result Value Ref Range Status   Adenovirus NOT DETECTED NOT DETECTED Final   Coronavirus 229E NOT DETECTED NOT DETECTED Final    Comment: (NOTE) The Coronavirus on the Respiratory Panel, DOES NOT test for the novel  Coronavirus (2019 nCoV)    Coronavirus HKU1 NOT DETECTED NOT DETECTED Final   Coronavirus NL63 NOT DETECTED NOT DETECTED Final   Coronavirus OC43 NOT DETECTED NOT DETECTED Final   Metapneumovirus NOT DETECTED NOT DETECTED Final   Rhinovirus / Enterovirus NOT DETECTED NOT DETECTED Final   Influenza A NOT DETECTED NOT DETECTED Final   Influenza B NOT DETECTED NOT DETECTED Final   Parainfluenza Virus 1 NOT DETECTED NOT DETECTED Final   Parainfluenza Virus 2 NOT DETECTED NOT DETECTED Final   Parainfluenza Virus 3 NOT DETECTED NOT DETECTED Final    Parainfluenza Virus 4 NOT DETECTED NOT DETECTED Final   Respiratory Syncytial Virus NOT DETECTED NOT DETECTED Final   Bordetella pertussis NOT DETECTED NOT DETECTED Final   Chlamydophila pneumoniae NOT DETECTED NOT DETECTED Final   Mycoplasma pneumoniae NOT DETECTED NOT DETECTED Final    Comment: Performed at [facility], [address] [place], [place] [address]         Radiology Studies: Dg Chest Portable 1 View  Result Date: 12/06/2018 CLINICAL DATA:  Cough and shortness of breath EXAM: PORTABLE CHEST 1 VIEW COMPARISON:  August 11, 2017 FINDINGS: Lungs are clear. Heart size and pulmonary vascularity are normal. No adenopathy. Patient is status post median sternotomy. No bone lesions. IMPRESSION: No edema or consolidation. Electronically Signed   By: Lowella Grip III M.D.   On: 12/06/2018 15:50        Scheduled Meds: . enoxaparin (LOVENOX) injection  40 mg Subcutaneous Q24H  . hydrocortisone sod succinate (SOLU-CORTEF) inj  50 mg Intravenous Q6H  . levothyroxine  37.5 mcg Intravenous Daily  . metoprolol tartrate  25 mg Oral Daily   Continuous Infusions: . ceFEPime (MAXIPIME) IV    . metronidazole 500 mg (12/07/18 1030)  . vancomycin 1,500 mg (12/07/18 1028)  . [START ON 12/08/2018] vancomycin       LOS: 1 day    Time spent: 25 minutes    Edwin Dada, MD [facility] 12/07/2018, 12:26 PM     Please page through [facility]:  [email or web address] Password [email or web address] If 7PM-7AM, please contact night-coverage

## 2018-12-07 NOTE — Progress Notes (Signed)
Transition of Care [facility]) - Progression Note    Patient Details  Name: [name] MRN: [id number] Date of Birth: [birth year]  [facility]) CM/SW Contact  [place], Chrystal Tyaskin, [place] Phone Number: 12/07/2018, 10:29 AM  Clinical Narrative:    Social work consult to provide patient with housing resources. Patient contacted by phone. Per patient, she and her husband are currently staying at the [facility] on [address]. They have been staying there for the last 2 months. Previous to the hotel stay, patient and her husband were residing with her sister in law.  Housing resources provided and discussed.Patient agreed to contact the agencies provided to explore additional housing options and possible availabilities. Per patient, her plan is to return to the [facility] upon discharge.  During the consult, patient's doctor walked in and patient requested that this social worker call back.  This Education officer, museum called patient back several times, however there was no further connection made. No further social work needs indicated, this Education officer, museum will sign off at this time.  Elliot Gurney, [facility]  [phone]           Expected Discharge Plan and Services                                     Social Determinants of Health (SDOH) Interventions    Readmission Risk Interventions No flowsheet data found.

## 2018-12-07 NOTE — Progress Notes (Signed)
Pharmacy Antibiotic Note  [name] is a 63 y.o. female admitted on 12/06/2018 with flu-like symptoms.  Pharmacy has been consulted for vancomycin and cefepime dosing for unknown source of infection. COVID-19 ip. RVP NEG. She is febrile with Tm 101.7, PCT 0.37, WBC normal. AKI, SCr 1.45 (baseline 0.8-0.9s).  Received cefepime 2 g IV on 3/27 @ 16:42 + metronidazole 500 mg IV on 3/27 @ 17:00.  Vancomycin 750 mg IV q36h. Goal AUC 400-550. Expected AUC: 502.2 SCr used: 1.45  Plan: Vancomycin 1500 mg IV load then 750 mg IV q36h Cefepime 2 g IV q24h Vancomycin levels as clinically indicated Monitor renal function, clinical progress, C/S   Height: 4\' 11"  (149.9 cm) Weight: 170 lb (77.1 kg) IBW/kg (Calculated) : 43.2  Temp (24hrs), Avg:99.4 F (37.4 C), Min:97.6 F (36.4 C), Max:101.7 F (38.7 C)  Recent Labs  Lab 12/06/18 1427 12/06/18 1539 12/06/18 1826  WBC 9.6  --  9.9  CREATININE 1.53* 1.40* 1.45*  LATICACIDVEN 2.1*  --  1.5    Estimated Creatinine Clearance: 37 mL/min (A) (by C-G formula based on SCr of 1.45 mg/dL (H)).    Allergies  Allergen Reactions  . Aspirin Other (See Comments)    Can take baby aspirin (heart flutters with high doses)  . Ibuprofen [Ibuprofen] Nausea And Vomiting  . Penicillins Nausea And Vomiting    Has patient had a PCN reaction causing immediate rash, facial/tongue/throat swelling, SOB or lightheadedness with hypotension: Yes Has patient had a PCN reaction causing severe rash involving mucus membranes or skin necrosis: No Has patient had a PCN reaction that required hospitalization No Has patient had a PCN reaction occurring within the last 10 years: No If all of the above answers are "NO", then may proceed with Cephalosporin use.     Antimicrobials this admission: cefepime 3/27 >>  metronidazole 3/27 >>  vancomycin 3/28 >>  Dose adjustments this admission:   Microbiology results: 3/27 BCx: ip 3/27 COVID-19: ip 3/27 RVP:  NEG  Thank you for allowing pharmacy to be a part of this patient's care.  Renold Genta, PharmD, BCPS Clinical Pharmacist Clinical phone for 12/07/2018 until 3p is [phone] 12/07/2018 8:43 AM  **Pharmacist phone directory can now be found on [email or web address] listed under [place]**

## 2018-12-08 LAB — COMPREHENSIVE METABOLIC PANEL
ALT: 42 U/L (ref 0–44)
AST: 37 U/L (ref 15–41)
Albumin: 3 g/dL — ABNORMAL LOW (ref 3.5–5.0)
Alkaline Phosphatase: 55 U/L (ref 38–126)
Anion gap: 7 (ref 5–15)
BUN: 19 mg/dL (ref 6–20)
CO2: 22 mmol/L (ref 22–32)
Calcium: 9.7 mg/dL (ref 8.9–10.3)
Chloride: 105 mmol/L (ref 98–111)
Creatinine, Ser: 1.09 mg/dL — ABNORMAL HIGH (ref 0.44–1.00)
GFR, EST NON AFRICAN AMERICAN: 55 mL/min — AB (ref >60)
Glucose, Bld: 174 mg/dL — ABNORMAL HIGH (ref 70–99)
Potassium: 4 mmol/L (ref 3.5–5.1)
Sodium: 134 mmol/L — ABNORMAL LOW (ref 135–145)
Total Bilirubin: 0.9 mg/dL (ref 0.3–1.2)
Total Protein: 6.6 g/dL (ref 6.5–8.1)

## 2018-12-08 LAB — CBC WITH DIFFERENTIAL/PLATELET
Abs Immature Granulocytes: 0.05 10*3/uL (ref 0.00–0.07)
Basophils Absolute: 0 10*3/uL (ref 0.0–0.1)
Basophils Relative: 0 %
Eosinophils Absolute: 0 10*3/uL (ref 0.0–0.5)
Eosinophils Relative: 0 %
HCT: 35 % — ABNORMAL LOW (ref 36.0–46.0)
Hemoglobin: 11.9 g/dL — ABNORMAL LOW (ref 12.0–15.0)
Immature Granulocytes: 1 %
Lymphocytes Relative: 9 %
Lymphs Abs: 0.9 10*3/uL (ref 0.7–4.0)
MCH: 29.6 pg (ref 26.0–34.0)
MCHC: 34 g/dL (ref 30.0–36.0)
MCV: 87.1 fL (ref 80.0–100.0)
Monocytes Absolute: 0.7 10*3/uL (ref 0.1–1.0)
Monocytes Relative: 6 %
Neutro Abs: 9.3 10*3/uL — ABNORMAL HIGH (ref 1.7–7.7)
Neutrophils Relative %: 84 %
Platelets: 243 10*3/uL (ref 150–400)
RBC: 4.02 MIL/uL (ref 3.87–5.11)
RDW: 13.2 % (ref 11.5–15.5)
WBC: 10.9 10*3/uL — AB (ref 4.0–10.5)
nRBC: 0 % (ref 0.0–0.2)

## 2018-12-08 MED ORDER — HYDROCORTISONE 10 MG PO TABS: 10.0000 mg | ORAL_TABLET | Freq: Two times a day (BID) | ORAL | Status: DC

## 2018-12-08 MED ORDER — HYDROCORTISONE 10 MG PO TABS
10.0000 mg | ORAL_TABLET | Freq: Every day | ORAL | Status: DC
  Administered 2018-12-08: 10 mg via ORAL
  Filled 2018-12-08: qty 1

## 2018-12-08 MED ORDER — HYDROCORTISONE 20 MG PO TABS
20.0000 mg | ORAL_TABLET | Freq: Every day | ORAL | Status: DC
  Administered 2018-12-08 – 2018-12-09 (×2): 20 mg via ORAL
  Filled 2018-12-08 (×2): qty 1

## 2018-12-08 NOTE — Progress Notes (Signed)
PROGRESS NOTE    [name]  GUY:[id number] DOB: 15-May-1956 DOA: 12/06/2018 PCP: Lanae Boast, FNP      Brief Narrative:  Mrs. Dacosta is a 63 y.o. M with hx asthma, migraines, secondary adrenal insufficiency, hypertension, hypothyroidism, and history of CVA resents with malaise, N/V/D, and fever for 5 days.  In the ER, the patient was hypoxic requiring NRB, chest x-ray clear, mild AKI, flu negative.  Given IV fluids and hospitalist asked to evaluate for fever and hypoxia, no clear source.      Assessment & Plan:  Sepsis doubted Presented with malaise, flu-like symptoms.  Presents with fever, tachycardia, WBC normal, lactate 2.1.  CXR and UA normal.  Blood cultures pending.  Source unclear.   -Stop antibiotics and monitor fever curve     Transaminitis Unclear cause, normalized -Check hepatitis serologies  Anion gap metabolic acidosis resolved  Hypothyroidism -Continue levothyroxine  Adrenal insufficiency -Resume home dose steroids  Asthma No wheezing  CV secondary prevention Hypertension -Continue home metoprolol - Restart home medications when reconciled  Hyponatremia Mild, asymptomatic  Acute kidney injury Creatinine normalized today.          MDM and disposition: Below labs and imaging reports reviewed and summarized above.  Medication management as above.  The patient was admitted with a sepsis-like physiology.  However no source is obvious.  We will stop antibiotics and monitor fever curve, if she remains stable for 24 hours, likely discharge home tomorrow.             DVT prophylaxis: Lovenox Code Status: FULL Family Communication: None present    Consultants:   None  Procedures:   None  Antimicrobials:   Vancomycni  3/27 >> 3/29  Cefepime 3/27 >> 3/29  Flagyl 3/27 >> 3/29   Subjective: Malaise, aches resolved.  No cough, dyspnea.  No vomiting.  No abdominal pain, flank pain, dysuria, confusion, chest  discomfort.         Objective: Vitals:   12/07/18 1541 12/07/18 2300 12/08/18 0338 12/08/18 0800  BP: 118/89 113/76  118/60  Pulse: 96 92 74   Resp: 19 18    Temp: (!) 97.3 F (36.3 C) 98 F (36.7 C)  98.5 F (36.9 C)  TempSrc: Oral Oral  Oral  SpO2: 98% 100% 100%   Weight:      Height:        Intake/Output Summary (Last 24 hours) at 12/08/2018 1536 Last data filed at 12/08/2018 0940 Gross per 24 hour  Intake 916.17 ml  Output -  Net 916.17 ml   Filed Weights   12/06/18 1503  Weight: 77.1 kg    Examination: General appearance: Adult female, lying in bed, no acute distress, interactive HEENT: Anicteric, conjunctival pink, lids and lashes normal.  No nasal deformity, discharge, epistaxis, lips moist, edentulous, oropharynx moist, no oral lesions, hearing normal.  Amblyopia noted.   Skin: Warm and dry, no mottling, no suspicious rashes or lesions. Cardiac: Rate regular, no murmurs or rubs.  JVP normal, no lower extremity edema. Respiratory: Normal respiratory rate and rhythm, lungs clear without rales or wheezes. Abdomen: Soft without tenderness palpation, ascites, or distention. MSK: No deformities or effusions of the large joints of the upper lower extremities bilaterally.   Neuro: Wake and alert, extraocular movements intact, appears stable.[place] Kitchen    Psych: Sensorium intact responding to questions, attention normal, affect normal, judgment and insight appear normal.    Data Reviewed: I have personally reviewed following labs and imaging studies:  CBC: Recent  Labs  Lab 12/06/18 1427 12/06/18 1539 12/06/18 1826 12/08/18 0304  WBC 9.6  --  9.9 10.9*  NEUTROABS 3.8  --   --  9.3*  HGB 15.3* 16.3* 15.1* 11.9*  HCT 47.2* 48.0* 48.7* 35.0*  MCV 91.1  --  93.1 87.1  PLT 367  --  272 350   Basic Metabolic Panel: Recent Labs  Lab 12/06/18 1427 12/06/18 1539 12/06/18 1826 12/08/18 0304  NA 132* 132*  --  134*  K 3.7 3.4*  --  4.0  CL 95*  --   --  105  CO2 19*   --   --  22  GLUCOSE 82  --   --  174*  BUN 18  --   --  19  CREATININE 1.53* 1.40* 1.45* 1.09*  CALCIUM 10.3  --   --  9.7   GFR: Estimated Creatinine Clearance: 49.2 mL/min (A) (by C-G formula based on SCr of 1.09 mg/dL (H)). Liver Function Tests: Recent Labs  Lab 12/06/18 1427 12/08/18 0304  AST 110* 37  ALT 76* 42  ALKPHOS 66 55  BILITOT 1.5* 0.9  PROT 8.1 6.6  ALBUMIN 3.9 3.0*   No results for input(s): LIPASE, AMYLASE in the last 168 hours. No results for input(s): AMMONIA in the last 168 hours. Coagulation Profile: No results for input(s): INR, PROTIME in the last 168 hours. Cardiac Enzymes: No results for input(s): CKTOTAL, CKMB, CKMBINDEX, TROPONINI in the last 168 hours. BNP (last 3 results) No results for input(s): PROBNP in the last 8760 hours. HbA1C: No results for input(s): HGBA1C in the last 72 hours. CBG: Recent Labs  Lab 12/06/18 1421  GLUCAP 85   Lipid Profile: No results for input(s): CHOL, HDL, LDLCALC, TRIG, CHOLHDL, LDLDIRECT in the last 72 hours. Thyroid Function Tests: No results for input(s): TSH, T4TOTAL, FREET4, T3FREE, THYROIDAB in the last 72 hours. Anemia Panel: Recent Labs    12/06/18 1826  FERRITIN 528*   Urine analysis:    Component Value Date/Time   COLORURINE AMBER (A) 12/06/2018 1717   APPEARANCEUR HAZY (A) 12/06/2018 1717   LABSPEC 1.020 12/06/2018 1717   PHURINE 5.0 12/06/2018 1717   GLUCOSEU NEGATIVE 12/06/2018 1717   HGBUR SMALL (A) 12/06/2018 1717   BILIRUBINUR NEGATIVE 12/06/2018 1717   BILIRUBINUR neg 09/27/2018 1013   KETONESUR 80 (A) 12/06/2018 1717   PROTEINUR 100 (A) 12/06/2018 1717   UROBILINOGEN 1.0 09/27/2018 1013   UROBILINOGEN 1.0 11/02/2013 1127   NITRITE NEGATIVE 12/06/2018 1717   LEUKOCYTESUR NEGATIVE 12/06/2018 1717   Sepsis Labs: @LABRCNTIP (procalcitonin:4,lacticacidven:4)  ) Recent Results (from the past 240 hour(s))  Blood Culture (routine x 2)     Status: None (Preliminary result)    Collection Time: 12/06/18  2:27 PM  Result Value Ref Range Status   Specimen Description   Final    BLOOD LEFT ANTECUBITAL Performed at [facility] [address] [place], [place] [address]    Special Requests   Final    BOTTLES DRAWN AEROBIC AND ANAEROBIC Blood Culture adequate volume Performed at [facility] [address] [place], [place] [address]    Culture   Final    NO GROWTH 2 DAYS Performed at [facility] [address], [place], [place] [address]    Report Status PENDING  Incomplete  Blood Culture (routine x 2)     Status: None (Preliminary result)   Collection Time: 12/06/18  3:00 PM  Result Value Ref Range Status  Specimen Description   Final    BLOOD LEFT ANTECUBITAL Performed at [facility] [address], [place], [place] [address]    Special Requests   Final    BOTTLES DRAWN AEROBIC ONLY Blood Culture adequate volume Performed at [facility] [address] [place], [place] [address]    Culture   Final    NO GROWTH 2 DAYS Performed at [facility] [address], [place], [place] [address]    Report Status PENDING  Incomplete  Respiratory Panel by PCR     Status: None   Collection Time: 12/06/18  3:02 PM  Result Value Ref Range Status   Adenovirus NOT DETECTED NOT DETECTED Final   Coronavirus 229E NOT DETECTED NOT DETECTED Final    Comment: (NOTE) The Coronavirus on the Respiratory Panel, DOES NOT test for the novel  Coronavirus (2019 nCoV)    Coronavirus HKU1 NOT DETECTED NOT DETECTED Final   Coronavirus NL63 NOT DETECTED NOT DETECTED Final   Coronavirus OC43 NOT DETECTED NOT DETECTED Final   Metapneumovirus NOT DETECTED NOT DETECTED Final   Rhinovirus / Enterovirus NOT DETECTED NOT DETECTED Final   Influenza A NOT DETECTED NOT DETECTED Final   Influenza B NOT DETECTED NOT DETECTED Final   Parainfluenza Virus 1 NOT DETECTED NOT DETECTED Final    Parainfluenza Virus 2 NOT DETECTED NOT DETECTED Final   Parainfluenza Virus 3 NOT DETECTED NOT DETECTED Final   Parainfluenza Virus 4 NOT DETECTED NOT DETECTED Final   Respiratory Syncytial Virus NOT DETECTED NOT DETECTED Final   Bordetella pertussis NOT DETECTED NOT DETECTED Final   Chlamydophila pneumoniae NOT DETECTED NOT DETECTED Final   Mycoplasma pneumoniae NOT DETECTED NOT DETECTED Final    Comment: Performed at [facility], [address] [place], [place] [address]         Radiology Studies: Dg Chest Portable 1 View  Result Date: 12/06/2018 CLINICAL DATA:  Cough and shortness of breath EXAM: PORTABLE CHEST 1 VIEW COMPARISON:  August 11, 2017 FINDINGS: Lungs are clear. Heart size and pulmonary vascularity are normal. No adenopathy. Patient is status post median sternotomy. No bone lesions. IMPRESSION: No edema or consolidation. Electronically Signed   By: Lowella Grip III M.D.   On: 12/06/2018 15:50        Scheduled Meds: . enoxaparin (LOVENOX) injection  40 mg Subcutaneous Q24H  . hydrocortisone  10 mg Oral QHS  . hydrocortisone  20 mg Oral Daily  . levothyroxine  37.5 mcg Intravenous Daily  . metoprolol tartrate  25 mg Oral Daily   Continuous Infusions:    LOS: 2 days    Time spent: 25 minutes    Edwin Dada, MD [facility] 12/08/2018, 3:36 PM     Please page through [place]:  [email or web address] Password [email or web address] If 7PM-7AM, please contact night-coverage      During this encounter: Patient Isolation: Droplet + Contact HCP PPE: Surgical mask, eye protection, gown, and gloves Patient PPE: None

## 2018-12-09 DIAGNOSIS — R509 Fever, unspecified: Secondary | ICD-10-CM

## 2018-12-09 LAB — COMPREHENSIVE METABOLIC PANEL
ALK PHOS: 58 U/L (ref 38–126)
ALT: 38 U/L (ref 0–44)
ANION GAP: 14 (ref 5–15)
AST: 36 U/L (ref 15–41)
Albumin: 3.6 g/dL (ref 3.5–5.0)
BUN: 18 mg/dL (ref 6–20)
CO2: 22 mmol/L (ref 22–32)
Calcium: 10.3 mg/dL (ref 8.9–10.3)
Chloride: 101 mmol/L (ref 98–111)
Creatinine, Ser: 1.05 mg/dL — ABNORMAL HIGH (ref 0.44–1.00)
GFR calc Af Amer: >60 mL/min (ref >60)
GFR calc non Af Amer: 58 mL/min — ABNORMAL LOW (ref >60)
Glucose, Bld: 95 mg/dL (ref 70–99)
Potassium: 4.3 mmol/L (ref 3.5–5.1)
SODIUM: 137 mmol/L (ref 135–145)
Total Bilirubin: 0.9 mg/dL (ref 0.3–1.2)
Total Protein: 7.2 g/dL (ref 6.5–8.1)

## 2018-12-09 LAB — CBC WITH DIFFERENTIAL/PLATELET
Abs Immature Granulocytes: 0.02 10*3/uL (ref 0.00–0.07)
Basophils Absolute: 0 10*3/uL (ref 0.0–0.1)
Basophils Relative: 0 %
EOS PCT: 0 %
Eosinophils Absolute: 0 10*3/uL (ref 0.0–0.5)
HCT: 39.5 % (ref 36.0–46.0)
Hemoglobin: 12.7 g/dL (ref 12.0–15.0)
Immature Granulocytes: 0 %
Lymphocytes Relative: 25 %
Lymphs Abs: 2.7 10*3/uL (ref 0.7–4.0)
MCH: 28.3 pg (ref 26.0–34.0)
MCHC: 32.2 g/dL (ref 30.0–36.0)
MCV: 88.2 fL (ref 80.0–100.0)
Monocytes Absolute: 0.8 10*3/uL (ref 0.1–1.0)
Monocytes Relative: 7 %
Neutro Abs: 7.4 10*3/uL (ref 1.7–7.7)
Neutrophils Relative %: 68 %
Platelets: 285 10*3/uL (ref 150–400)
RBC: 4.48 MIL/uL (ref 3.87–5.11)
RDW: 13.4 % (ref 11.5–15.5)
WBC: 10.9 10*3/uL — ABNORMAL HIGH (ref 4.0–10.5)
nRBC: 0 % (ref 0.0–0.2)

## 2018-12-09 LAB — HEPATITIS PANEL, ACUTE
HCV Ab: 0.2 s/co ratio (ref 0.0–0.9)
Hep A IgM: NEGATIVE
Hep B C IgM: NEGATIVE
Hepatitis B Surface Ag: NEGATIVE

## 2018-12-09 MED ORDER — METOPROLOL TARTRATE 25 MG PO TABS: 25.0000 mg | ORAL_TABLET | Freq: Every day | ORAL | Status: DC

## 2018-12-09 MED ORDER — LEVOTHYROXINE SODIUM 75 MCG PO TABS: 75.0000 ug | ORAL_TABLET | Freq: Every day | ORAL | Status: DC

## 2018-12-09 NOTE — Discharge Summary (Signed)
Physician Discharge Summary  [name] FTD:[id number] DOB: [birth year] DOA: 12/06/2018  PCP: Lanae Boast, FNP  Admit date: 12/06/2018 Discharge date: 12/09/2018  Admitted From: Home  Disposition:  Home   Recommendations for Outpatient Follow-up:  1. Follow up with PCP in 1-2 weeks 2. Please obtain BMP/CBC in one week 3. Please follow up on the following pending results: coronavirus NAAT, hepatitis serologies     Home Health: None  Equipment/Devices: None  Discharge Condition: Fair  CODE STATUS: FULL Diet recommendation: Regular  Brief/Interim Summary: [name] is a 63 y.o. M with hx asthma, migraines, secondary adrenal insufficiency, hypertension, hypothyroidism, and history of CVA resents with malaise, N/V/D, and fever for 5 days.  In the ER, the patient was hypoxic requiring NRB, chest x-ray clear, mild AKI, flu negative.  Given IV fluids and hospitalist asked to evaluate for fever and hypoxia, no clear source.     PRINCIPAL HOSPITAL DIAGNOSIS: Nonspecific viral syndrome    Discharge Diagnoses:   Nonspecific viral syndrome Sepsis doubted Presented with malaise, flu-like symptoms, fever, tachycardia, WBC normal, lactate 2.1.  CXR and UA normal.  Blood cultures and urine culture negative for growth.  Treated with empiric antibiotics x48 hours. Abx stopped, no further fever or malaise.  Doubt sepsis or serious bacterial infection.  Possible coronavirus.  Possibly other nonspecific viral condition.     Transaminitis Unclear cause, normalized.  Hepatitis serologies pending.  Anion gap metabolic acidosis Resolved.  Hypothyroidism  Adrenal insufficiency Given stress dose steroids for 24 hours, then resumed home steroids.  Asthma No wheezing  CV secondary prevention Hypertension  Hyponatremia Mild, asymptomatic  Acute kidney injury Creatinine normalized today.          Discharge Instructions  Discharge Instructions     Discharge instructions   Complete by:  As directed    From Dr. Loleta Stevenson: You were admitted for feeling sick.  In the hospital, your blood and urine cultures were normal (no infection). None of your other testing showed anything, and you got better quickly.  I am not sure what caused you to be sick. Your CORONAVIRUS test is still not back yet, so it MAY BE coronavirus.  With that in mind, please carefully stay away from all people. Until the hospital calls you (in the next 1-2 weeks) please assume you are infectious!  That anything you touch without washing your hands, or anyone you talk to or breathe on could be infected.  Stay away from people, wear a mask when around people, wash your hands often, don't leave your hotel room, and stay away from strangers.    If you feel sicker, return to the ER.   Increase activity slowly   Complete by:  As directed      Allergies as of 12/09/2018      Reactions   Aspirin Other (See Comments)   Can take baby aspirin (heart flutters with high doses)   Ibuprofen [ibuprofen] Nausea And Vomiting   Penicillins Nausea And Vomiting   Has patient had a PCN reaction causing immediate rash, facial/tongue/throat swelling, SOB or lightheadedness with hypotension: Yes Has patient had a PCN reaction causing severe rash involving mucus membranes or skin necrosis: No Has patient had a PCN reaction that required hospitalization No Has patient had a PCN reaction occurring within the last 10 years: No If all of the above answers are "NO", then may proceed with Cephalosporin use.      Medication List    TAKE these medications   fenofibrate  48 MG tablet Commonly known as:  Tricor Take 1 tablet (48 mg total) by mouth daily.   hydrocortisone 20 MG tablet Commonly known as:  CORTEF Take 0.5-1 tablets (10-20 mg total) by mouth 2 (two) times daily. Take 1 tablet every morning and 1/2 tablet every evening What changed:    how much to take  additional  instructions   levothyroxine 75 MCG tablet Commonly known as:  SYNTHROID, LEVOTHROID Take 1 tablet (75 mcg total) by mouth daily.   metoprolol tartrate 25 MG tablet Commonly known as:  LOPRESSOR Take 1 tablet (25 mg total) by mouth daily.   omega-3 acid ethyl esters 1 g capsule Commonly known as:  Lovaza Take 1 capsule (1 g total) by mouth 2 (two) times daily.   omeprazole 20 MG capsule Commonly known as:  PRILOSEC Take 1 capsule (20 mg total) by mouth daily.   rosuvastatin 10 MG tablet Commonly known as:  CRESTOR Take 10 mg by mouth daily.   traZODone 50 MG tablet Commonly known as:  DESYREL Take 0.5-1 tablets (25-50 mg total) by mouth at bedtime as needed for sleep. What changed:  how much to take   venlafaxine XR 150 MG 24 hr capsule Commonly known as:  EFFEXOR-XR Take 1 capsule (150 mg total) by mouth daily.            Durable Medical Equipment  (From admission, onward)         Start     Ordered   12/09/18 0924  For home use only DME 4 wheeled rolling walker with seat  Once    Question:  Patient needs a walker to treat with the following condition  Answer:  Weakness   12/09/18 0923         Follow-up Information    [facility]   Why:  rollator       Lanae Boast, [place]. Schedule an appointment as soon as possible for a visit in 1 week.   Specialty:  Family Medicine Why:  December 27, 2018 at 11:00 a.m. Contact information: [place] [address] [phone]          Allergies  Allergen Reactions  . Aspirin Other (See Comments)    Can take baby aspirin (heart flutters with high doses)  . Ibuprofen [Ibuprofen] Nausea And Vomiting  . Penicillins Nausea And Vomiting    Has patient had a PCN reaction causing immediate rash, facial/tongue/throat swelling, SOB or lightheadedness with hypotension: Yes Has patient had a PCN reaction causing severe rash involving mucus membranes or skin necrosis: No Has patient had a PCN  reaction that required hospitalization No Has patient had a PCN reaction occurring within the last 10 years: No If all of the above answers are "NO", then may proceed with Cephalosporin use.     Consultations:  None   Procedures/Studies: Dg Chest Portable 1 View  Result Date: 12/06/2018 CLINICAL DATA:  Cough and shortness of breath EXAM: PORTABLE CHEST 1 VIEW COMPARISON:  August 11, 2017 FINDINGS: Lungs are clear. Heart size and pulmonary vascularity are normal. No adenopathy. Patient is status post median sternotomy. No bone lesions. IMPRESSION: No edema or consolidation. Electronically Signed   By: Lowella Grip III M.D.   On: 12/06/2018 15:50      Subjective: Feeling well.  No cough, malaise.  No abdominal pain, no vomiting, no diarrhea.  No dyspnea.    Discharge Exam: Vitals:   12/08/18 1604 12/08/18 2300  BP: 139/80 108/76  Pulse: 68 95  Resp: 16 16  Temp: 97.8 F (36.6 C) 98.3 F (36.8 C)  SpO2: 98% 100%   Vitals:   12/08/18 0338 12/08/18 0800 12/08/18 1604 12/08/18 2300  BP:  118/60 139/80 108/76  Pulse: 74  68 95  Resp:   16 16  Temp:  98.5 F (36.9 C) 97.8 F (36.6 C) 98.3 F (36.8 C)  TempSrc:  Oral Oral Oral  SpO2: 100%  98% 100%  Weight:      Height:        General: Pt is alert, awake, not in acute distress Cardiovascular: RRR, nl S1-S2, no murmurs appreciated.   No LE edema.   Respiratory: Normal respiratory rate and rhythm.  CTAB without rales or wheezes. Abdominal: Abdomen soft and non-tender.  No distension or HSM.   Neuro/Psych: Strength symmetric in upper and lower extremities.  Judgment and insight appear normal.   The results of significant diagnostics from this hospitalization (including imaging, microbiology, ancillary and laboratory) are listed below for reference.     Microbiology: Recent Results (from the past 240 hour(s))  Blood Culture (routine x 2)     Status: None (Preliminary result)   Collection Time: 12/06/18  2:27 PM   Result Value Ref Range Status   Specimen Description   Final    BLOOD LEFT ANTECUBITAL Performed at [facility] [address], [place], [place] [address]    Special Requests   Final    BOTTLES DRAWN AEROBIC AND ANAEROBIC Blood Culture adequate volume Performed at [facility] [address] [place], [place] [address]    Culture   Final    NO GROWTH 3 DAYS Performed at [facility], [place] [address] [place], [place] [address]    Report Status PENDING  Incomplete  Blood Culture (routine x 2)     Status: None (Preliminary result)   Collection Time: 12/06/18  3:00 PM  Result Value Ref Range Status   Specimen Description   Final    BLOOD LEFT ANTECUBITAL Performed at [facility] [address], [place], [place] [address]    Special Requests   Final    BOTTLES DRAWN AEROBIC ONLY Blood Culture adequate volume Performed at [facility] [address], [place], [place] [address]    Culture   Final    NO GROWTH 3 DAYS Performed at [facility], [place] [address], [place], [place] [address]    Report Status PENDING  Incomplete  Respiratory Panel by PCR     Status: None   Collection Time: 12/06/18  3:02 PM  Result Value Ref Range Status   Adenovirus NOT DETECTED NOT DETECTED Final   Coronavirus 229E NOT DETECTED NOT DETECTED Final    Comment: (NOTE) The Coronavirus on the Respiratory Panel, DOES NOT test for the novel  Coronavirus (2019 nCoV)    Coronavirus HKU1 NOT DETECTED NOT DETECTED Final   Coronavirus NL63 NOT DETECTED NOT DETECTED Final   Coronavirus OC43 NOT DETECTED NOT DETECTED Final   Metapneumovirus NOT DETECTED NOT DETECTED Final   Rhinovirus / Enterovirus NOT DETECTED NOT DETECTED Final   Influenza A NOT DETECTED NOT DETECTED Final   Influenza B NOT DETECTED NOT DETECTED Final   Parainfluenza Virus 1 NOT DETECTED NOT DETECTED Final   Parainfluenza Virus 2 NOT DETECTED NOT  DETECTED Final   Parainfluenza Virus 3 NOT DETECTED NOT DETECTED Final   Parainfluenza Virus 4 NOT DETECTED NOT DETECTED Final  Respiratory Syncytial Virus NOT DETECTED NOT DETECTED Final   Bordetella pertussis NOT DETECTED NOT DETECTED Final   Chlamydophila pneumoniae NOT DETECTED NOT DETECTED Final   Mycoplasma pneumoniae NOT DETECTED NOT DETECTED Final    Comment: Performed at [facility], [place] [address] [place], [place] [address]     Labs: BNP (last 3 results) No results for input(s): BNP in the last 8760 hours. Basic Metabolic Panel: Recent Labs  Lab 12/06/18 1427 12/06/18 1539 12/06/18 1826 12/08/18 0304 12/09/18 0550  NA 132* 132*  --  134* 137  K 3.7 3.4*  --  4.0 4.3  CL 95*  --   --  105 101  CO2 19*  --   --  22 22  GLUCOSE 82  --   --  174* 95  BUN 18  --   --  19 18  CREATININE 1.53* 1.40* 1.45* 1.09* 1.05*  CALCIUM 10.3  --   --  9.7 10.3   Liver Function Tests: Recent Labs  Lab 12/06/18 1427 12/08/18 0304 12/09/18 0550  AST 110* 37 36  ALT 76* 42 38  ALKPHOS 66 55 58  BILITOT 1.5* 0.9 0.9  PROT 8.1 6.6 7.2  ALBUMIN 3.9 3.0* 3.6   No results for input(s): LIPASE, AMYLASE in the last 168 hours. No results for input(s): AMMONIA in the last 168 hours. CBC: Recent Labs  Lab 12/06/18 1427 12/06/18 1539 12/06/18 1826 12/08/18 0304 12/09/18 0550  WBC 9.6  --  9.9 10.9* 10.9*  NEUTROABS 3.8  --   --  9.3* 7.4  HGB 15.3* 16.3* 15.1* 11.9* 12.7  HCT 47.2* 48.0* 48.7* 35.0* 39.5  MCV 91.1  --  93.1 87.1 88.2  PLT 367  --  272 243 285   Cardiac Enzymes: No results for input(s): CKTOTAL, CKMB, CKMBINDEX, TROPONINI in the last 168 hours. BNP: Invalid input(s): POCBNP CBG: Recent Labs  Lab 12/06/18 1421  GLUCAP 85   D-Dimer No results for input(s): DDIMER in the last 72 hours. Hgb A1c No results for input(s): HGBA1C in the last 72 hours. Lipid Profile No results for input(s): CHOL, HDL, LDLCALC, TRIG, CHOLHDL, LDLDIRECT in the last 72  hours. Thyroid function studies No results for input(s): TSH, T4TOTAL, T3FREE, THYROIDAB in the last 72 hours.  Invalid input(s): FREET3 Anemia work up Recent Labs    12/06/18 1826  FERRITIN 528*   Urinalysis    Component Value Date/Time   COLORURINE AMBER (A) 12/06/2018 1717   APPEARANCEUR HAZY (A) 12/06/2018 1717   LABSPEC 1.020 12/06/2018 1717   PHURINE 5.0 12/06/2018 1717   GLUCOSEU NEGATIVE 12/06/2018 1717   HGBUR SMALL (A) 12/06/2018 1717   BILIRUBINUR NEGATIVE 12/06/2018 1717   BILIRUBINUR neg 09/27/2018 1013   KETONESUR 80 (A) 12/06/2018 1717   PROTEINUR 100 (A) 12/06/2018 1717   UROBILINOGEN 1.0 09/27/2018 1013   UROBILINOGEN 1.0 11/02/2013 1127   NITRITE NEGATIVE 12/06/2018 1717   LEUKOCYTESUR NEGATIVE 12/06/2018 1717   Sepsis Labs Invalid input(s): PROCALCITONIN,  WBC,  LACTICIDVEN Microbiology Recent Results (from the past 240 hour(s))  Blood Culture (routine x 2)     Status: None (Preliminary result)   Collection Time: 12/06/18  2:27 PM  Result Value Ref Range Status   Specimen Description   Final    BLOOD LEFT ANTECUBITAL Performed at [facility], [place] [address] [place],  [address]    Special Requests   Final    BOTTLES DRAWN AEROBIC AND ANAEROBIC Blood Culture adequate  volume Performed at [facility], [place] [address] [place], [place] [address]    Culture   Final    NO GROWTH 3 DAYS Performed at [facility], [place] [address] [place], [place] [address]    Report Status PENDING  Incomplete  Blood Culture (routine x 2)     Status: None (Preliminary result)   Collection Time: 12/06/18  3:00 PM  Result Value Ref Range Status   Specimen Description   Final    BLOOD LEFT ANTECUBITAL Performed at [facility] [address] [place], [place] [address]    Special Requests   Final    BOTTLES DRAWN AEROBIC ONLY Blood Culture adequate volume Performed at [facility] [address], [place], [place] [address]    Culture   Final    NO GROWTH 3 DAYS Performed at [facility], [place] [address] [place], [place] [address]    Report Status PENDING  Incomplete  Respiratory Panel by PCR     Status: None   Collection Time: 12/06/18  3:02 PM  Result Value Ref Range Status   Adenovirus NOT DETECTED NOT DETECTED Final   Coronavirus 229E NOT DETECTED NOT DETECTED Final    Comment: (NOTE) The Coronavirus on the Respiratory Panel, DOES NOT test for the novel  Coronavirus (2019 nCoV)    Coronavirus HKU1 NOT DETECTED NOT DETECTED Final   Coronavirus NL63 NOT DETECTED NOT DETECTED Final   Coronavirus OC43 NOT DETECTED NOT DETECTED Final   Metapneumovirus NOT DETECTED NOT DETECTED Final   Rhinovirus / Enterovirus NOT DETECTED NOT DETECTED Final   Influenza A NOT DETECTED NOT DETECTED Final   Influenza B NOT DETECTED NOT DETECTED Final   Parainfluenza Virus 1 NOT DETECTED NOT DETECTED Final   Parainfluenza Virus 2 NOT DETECTED NOT DETECTED Final   Parainfluenza Virus 3 NOT DETECTED NOT DETECTED Final   Parainfluenza Virus 4 NOT DETECTED NOT DETECTED Final   Respiratory Syncytial Virus NOT DETECTED NOT DETECTED Final   Bordetella pertussis NOT DETECTED NOT DETECTED Final   Chlamydophila pneumoniae NOT DETECTED NOT DETECTED Final   Mycoplasma pneumoniae NOT DETECTED NOT DETECTED Final    Comment: Performed at [facility], [address] [place], [place] [address]     Time coordinating discharge: 20 minutes      SIGNED:   Edwin Dada, MD  [facility] 12/09/2018, 5:31 PM   During this encounter: Patient Isolation: Droplet + Contact HCP PPE: Surgical mask, eye protection, gown, and gloves Patient PPE: None

## 2018-12-09 NOTE — Progress Notes (Signed)
Patient was wheeled downstairs and family not there to pick up, patient wheeled back to room. Per son he will call nurse when downstairs.

## 2018-12-09 NOTE — Progress Notes (Signed)
PHARMACIST - PHYSICIAN COMMUNICATION  DR:  Loleta Books  CONCERNING: IV to Oral Route Change Policy  RECOMMENDATION: This patient is receiving Levothyroxine by the intravenous route.  Based on criteria approved by the Pharmacy and Therapeutics Committee, the intravenous medication(s) is/are being converted to the equivalent oral dose form(s).   DESCRIPTION: These criteria include:  The patient is eating (either orally or via tube) and/or has been taking other orally administered medications for a least 24 hours  The patient has no evidence of active gastrointestinal bleeding or impaired GI absorption (gastrectomy, short bowel, patient on TNA or NPO).  If you have questions about this conversion, please contact the Pharmacy Department  []   [phone] )  [facility] []   [phone] )  [facility] [x]   [phone] )  [facility] []   [phone] )  [facility] []   [phone] )  [place], [facility] 12/09/2018 12:58 PM

## 2018-12-09 NOTE — Progress Notes (Signed)
Patient discharged via wheelchair, private car with son Darrick Meigs, explained AVS and Covid agreement while assisting patient into car.

## 2018-12-09 NOTE — TOC Transition Note (Signed)
Transition of Care [facility]) - CM/SW Discharge Note   Patient Details  Name: ANIAYAH ALANIZ MRN: [id number] Date of Birth: [birth year]  Transition of Care [facility]) CM/SW Contact:  Zenon Mayo, RN Phone Number: 12/09/2018, 9:55 AM   Clinical Narrative:    Patient from motel [place]) with spouse and son.  She has [employer] which she states she has copay for her medications. She has pcp [facility], she has transportation at discharge.  She states she needs a rollator at discharge , NCM contacted Brad with [employer] he will bring the rollator to patient's room prior to discharge.     Final next level of care: Homeless Shelter Barriers to Discharge: No Barriers Identified   Patient Goals and CMS Choice Patient states their goals for this hospitalization and ongoing recovery are:: get better   Choice offered to / list presented to : NA  Discharge Placement                       Discharge Plan and Services In-house Referral: NA Discharge Planning Services: CM Consult Post Acute Care Choice: Durable Medical Equipment          DME Arranged: Walker rolling with seat DME Agency: [facility] HH Arranged: NA HH Agency: NA   Social Determinants of Health (SDOH) Interventions     Readmission Risk Interventions Readmission Risk Prevention Plan 12/09/2018  Medication Screening Complete  Transportation Screening Complete  Some recent data might be hidden

## 2018-12-10 LAB — HEPATITIS PANEL, ACUTE
HCV Ab: <0.1 s/co ratio (ref 0.0–0.9)
HEP B C IGM: NEGATIVE
Hep A IgM: NEGATIVE
Hepatitis B Surface Ag: NEGATIVE

## 2018-12-10 LAB — NOVEL CORONAVIRUS, NAA (HOSP ORDER, SEND-OUT TO REF LAB; TAT 18-24 HRS): SARS-CoV-2, NAA: NOT DETECTED

## 2018-12-11 LAB — CULTURE, BLOOD (ROUTINE X 2)
Culture: NO GROWTH
Culture: NO GROWTH
Special Requests: ADEQUATE
Special Requests: ADEQUATE

## 2018-12-19 DIAGNOSIS — G43909 Migraine, unspecified, not intractable, without status migrainosus: Secondary | ICD-10-CM | POA: Diagnosis not present

## 2018-12-19 DIAGNOSIS — K219 Gastro-esophageal reflux disease without esophagitis: Secondary | ICD-10-CM | POA: Diagnosis not present

## 2018-12-19 DIAGNOSIS — E039 Hypothyroidism, unspecified: Secondary | ICD-10-CM | POA: Diagnosis not present

## 2018-12-19 DIAGNOSIS — I208 Other forms of angina pectoris: Secondary | ICD-10-CM | POA: Diagnosis not present

## 2018-12-19 DIAGNOSIS — I10 Essential (primary) hypertension: Secondary | ICD-10-CM | POA: Diagnosis not present

## 2018-12-19 DIAGNOSIS — E785 Hyperlipidemia, unspecified: Secondary | ICD-10-CM | POA: Diagnosis not present

## 2018-12-26 ENCOUNTER — Telehealth: Payer: Self-pay

## 2018-12-26 DIAGNOSIS — M1990 Unspecified osteoarthritis, unspecified site: Secondary | ICD-10-CM

## 2018-12-26 NOTE — Telephone Encounter (Signed)
Patient needs a order for a shower chair. Patient would like it sent to [facility].

## 2018-12-26 NOTE — Telephone Encounter (Signed)
Order has been sent to [facility]

## 2018-12-27 ENCOUNTER — Encounter: Payer: Self-pay | Admitting: Family Medicine

## 2018-12-27 ENCOUNTER — Other Ambulatory Visit: Payer: Medicaid Other

## 2018-12-27 ENCOUNTER — Ambulatory Visit (INDEPENDENT_AMBULATORY_CARE_PROVIDER_SITE_OTHER): Payer: Medicaid Other | Admitting: Family Medicine

## 2018-12-27 ENCOUNTER — Other Ambulatory Visit: Payer: Self-pay

## 2018-12-27 DIAGNOSIS — E039 Hypothyroidism, unspecified: Secondary | ICD-10-CM

## 2018-12-27 DIAGNOSIS — N179 Acute kidney failure, unspecified: Secondary | ICD-10-CM

## 2018-12-27 DIAGNOSIS — K219 Gastro-esophageal reflux disease without esophagitis: Secondary | ICD-10-CM | POA: Diagnosis not present

## 2018-12-27 DIAGNOSIS — M1990 Unspecified osteoarthritis, unspecified site: Secondary | ICD-10-CM | POA: Diagnosis not present

## 2018-12-27 MED ORDER — OMEPRAZOLE 20 MG PO CPDR: 20.0000 mg | DELAYED_RELEASE_CAPSULE | Freq: Every day | ORAL | 2 refills | Status: DC

## 2018-12-27 NOTE — Progress Notes (Signed)
  Patient [name]  Virtual Visit via Telephone Note  I connected with [name] on 12/27/18 at 11:00 AM EDT by telephone and verified that I am speaking with the correct person using two identifiers.   I discussed the limitations, risks, security and privacy concerns of performing an evaluation and management service by telephone and the availability of in person appointments. I also discussed with the patient that there may be a patient responsible charge related to this service. The patient expressed understanding and agreed to proceed.   History of Present Illness: [name]  Was seen in the ED on 12/08/2018 due to malaise and flu-like symptoms. She states that she was diagnosed with UTI. Her symptoms have since resolved. She is doing well today. Reports compliance with her medications. Denies side effects of medications. Denies chest pain, SOB, dizziness or leg swelling.   Observations/Objective: Patient with regular voice tone, rate and rhythm. Speaking calmly and is in no apparent distress.    Assessment and Plan: Hypothyroidism, unspecified type - Plan: TSH  Gastroesophageal reflux disease, esophagitis presence not specified - Plan: omeprazole (PRILOSEC) 20 MG capsule  Acute renal failure, unspecified acute renal failure type ([place]) - Plan: CBC With Differential, Comprehensive metabolic panel   Patient to come in to the office today for hospital follow up lab work.    Follow Up Instructions:    I discussed the assessment and treatment plan with the patient. The patient was provided an opportunity to ask questions and all were answered. The patient agreed with the plan and demonstrated an understanding of the instructions.   The patient was advised to call back or seek an in-person evaluation if the symptoms worsen or if the condition fails to improve as anticipated.  I provided 10 minutes of non-face-to-face time  during this encounter.  [name], FNP-BC [facility] [address] [place], [place] [address] [phone]

## 2018-12-28 LAB — COMPREHENSIVE METABOLIC PANEL
ALT: 32 IU/L (ref 0–32)
AST: 31 IU/L (ref 0–40)
Albumin/Globulin Ratio: 1.6 (ref 1.2–2.2)
Albumin: 4.1 g/dL (ref 3.8–4.9)
Alkaline Phosphatase: 67 IU/L (ref 39–117)
BUN/Creatinine Ratio: 17 (ref 12–28)
BUN: 19 mg/dL (ref 8–27)
Bilirubin Total: 0.3 mg/dL (ref 0.0–1.2)
CO2: 23 mmol/L (ref 20–29)
Calcium: 9.6 mg/dL (ref 8.7–10.3)
Chloride: 101 mmol/L (ref 96–106)
Creatinine, Ser: 1.13 mg/dL — ABNORMAL HIGH (ref 0.57–1.00)
GFR calc Af Amer: 61 mL/min/{1.73_m2} (ref >59)
GFR calc non Af Amer: 53 mL/min/{1.73_m2} — ABNORMAL LOW (ref >59)
Globulin, Total: 2.5 g/dL (ref 1.5–4.5)
Glucose: 72 mg/dL (ref 65–99)
Potassium: 4.6 mmol/L (ref 3.5–5.2)
Sodium: 141 mmol/L (ref 134–144)
Total Protein: 6.6 g/dL (ref 6.0–8.5)

## 2018-12-28 LAB — CBC WITH DIFFERENTIAL
Basophils Absolute: 0 10*3/uL (ref 0.0–0.2)
Basos: 1 %
EOS (ABSOLUTE): 0.3 10*3/uL (ref 0.0–0.4)
Eos: 4 %
Hematocrit: 39.8 % (ref 34.0–46.6)
Hemoglobin: 13.4 g/dL (ref 11.1–15.9)
Immature Grans (Abs): 0 10*3/uL (ref 0.0–0.1)
Immature Granulocytes: 0 %
Lymphocytes Absolute: 3.8 10*3/uL — ABNORMAL HIGH (ref 0.7–3.1)
Lymphs: 57 %
MCH: 29.8 pg (ref 26.6–33.0)
MCHC: 33.7 g/dL (ref 31.5–35.7)
MCV: 88 fL (ref 79–97)
Monocytes Absolute: 0.6 10*3/uL (ref 0.1–0.9)
Monocytes: 9 %
Neutrophils Absolute: 1.9 10*3/uL (ref 1.4–7.0)
Neutrophils: 29 %
RBC: 4.5 x10E6/uL (ref 3.77–5.28)
RDW: 14.2 % (ref 11.7–15.4)
WBC: 6.7 10*3/uL (ref 3.4–10.8)

## 2018-12-28 LAB — TSH: TSH: 0.177 u[IU]/mL — ABNORMAL LOW (ref 0.450–4.500)

## 2018-12-30 ENCOUNTER — Telehealth: Payer: Self-pay

## 2018-12-30 MED ORDER — LEVOTHYROXINE SODIUM 50 MCG PO TABS: 50.0000 ug | ORAL_TABLET | Freq: Every day | ORAL | 3 refills | Status: DC

## 2018-12-30 NOTE — Telephone Encounter (Signed)
-----   Message from Lanae Boast, [place] sent at 12/30/2018  8:40 AM EDT ----- Please let patient know that her TSH was too low. I am going to send a script to the pharmacy for 51mcg instead of 10mcg. I sent the prescription to [facility] for her. We will repeat the labs in 6-8 weeks. The order has been placed.

## 2018-12-30 NOTE — Addendum Note (Signed)
Addended by: Genelle Bal on: 12/30/2018 08:40 AM   Modules accepted: Orders

## 2018-12-30 NOTE — Telephone Encounter (Signed)
Called and spoke with patient. Advised that TSH was low and that we need to change dose on medication. Advised that new rx for 50 mcg has been sent into pharmacy and she should take that everyday as directed. Advised that we will repeat lab work in 6 to 8 weeks and she has been sent to the front to schedule an appointment. Thanks!

## 2019-02-10 ENCOUNTER — Other Ambulatory Visit: Payer: Medicaid Other

## 2019-02-12 ENCOUNTER — Encounter (HOSPITAL_COMMUNITY): Payer: Self-pay | Admitting: Endocrinology

## 2019-03-20 DIAGNOSIS — K219 Gastro-esophageal reflux disease without esophagitis: Secondary | ICD-10-CM | POA: Diagnosis not present

## 2019-03-20 DIAGNOSIS — E039 Hypothyroidism, unspecified: Secondary | ICD-10-CM | POA: Diagnosis not present

## 2019-03-20 DIAGNOSIS — I208 Other forms of angina pectoris: Secondary | ICD-10-CM | POA: Diagnosis not present

## 2019-03-20 DIAGNOSIS — I10 Essential (primary) hypertension: Secondary | ICD-10-CM | POA: Diagnosis not present

## 2019-03-20 DIAGNOSIS — G43909 Migraine, unspecified, not intractable, without status migrainosus: Secondary | ICD-10-CM | POA: Diagnosis not present

## 2019-03-20 DIAGNOSIS — E785 Hyperlipidemia, unspecified: Secondary | ICD-10-CM | POA: Diagnosis not present

## 2019-03-28 ENCOUNTER — Ambulatory Visit (INDEPENDENT_AMBULATORY_CARE_PROVIDER_SITE_OTHER): Payer: Medicaid Other | Admitting: Family Medicine

## 2019-03-28 ENCOUNTER — Other Ambulatory Visit: Payer: Self-pay

## 2019-03-28 ENCOUNTER — Encounter: Payer: Self-pay | Admitting: Family Medicine

## 2019-03-28 VITALS — BP 103/69 | HR 69 | Temp 98.6°F | Resp 16 | Ht 59.0 in | Wt 191.0 lb

## 2019-03-28 DIAGNOSIS — G8929 Other chronic pain: Secondary | ICD-10-CM

## 2019-03-28 DIAGNOSIS — R51 Headache: Secondary | ICD-10-CM | POA: Diagnosis not present

## 2019-03-28 DIAGNOSIS — E039 Hypothyroidism, unspecified: Secondary | ICD-10-CM | POA: Diagnosis not present

## 2019-03-28 MED ORDER — BUTALBITAL-APAP-CAFFEINE 50-325-40 MG PO TABS: 1.0000 | ORAL_TABLET | Freq: Four times a day (QID) | ORAL | 2 refills | Status: DC | PRN

## 2019-03-28 NOTE — Progress Notes (Signed)
Patient [facility]   Progress Note: General Provider: Lanae Boast, FNP  SUBJECTIVE:   [name] is a 63 y.o. 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 Patient presents today for Hypothyroidism and Headache   Patient reports a history of headaches due to a head trauma several years ago. She states that she is having an in crease in migraines in the past week. Only relief is with sleeping. +photo and phonophobia with headaches.  Denies dizziness. Patient with a history of hypothyroidism. She reports compliance with medications.     Review of Systems  Constitutional: Negative.   HENT: Negative.   Eyes: Negative.   Respiratory: Negative.   Cardiovascular: Negative.   Gastrointestinal: Negative.   Genitourinary: Negative.   Musculoskeletal: Negative.   Skin: Negative.   Neurological: Positive for headaches.  Psychiatric/Behavioral: Negative.      OBJECTIVE: BP 103/69 (BP Location: Right Arm, Patient Position: Sitting, Cuff Size: Normal)   Pulse 69   Temp 98.6 F (37 C) (Oral)   Resp 16   Ht 4\' 11"  (1.499 m)   Wt 191 lb (86.6 kg)   SpO2 100%   BMI 38.58 kg/m   Wt Readings from Last 3 Encounters:  03/28/19 191 lb (86.6 kg)  12/06/18 170 lb (77.1 kg)  09/27/18 208 lb (94.3 kg)     Physical Exam Vitals signs and nursing note reviewed.  Constitutional:      General: She is not in acute distress.    Appearance: Normal appearance.  HENT:     Head: Normocephalic and  atraumatic.  Eyes:     Extraocular Movements: Extraocular movements intact.     Conjunctiva/sclera: Conjunctivae normal.     Pupils: Pupils are equal, round, and reactive to light.  Neck:     Musculoskeletal: Normal range of motion.  Cardiovascular:     Rate and Rhythm: Normal rate and regular rhythm.     Heart sounds: No murmur.  Pulmonary:     Effort: Pulmonary effort is normal.     Breath sounds: Normal breath sounds.  Musculoskeletal: Normal range of motion.  Skin:    General: Skin is warm and dry.  Neurological:     Mental Status: She is alert and oriented to person, place, and time. Mental status is at baseline.     Motor: No weakness.     Coordination: Coordination normal.     Gait: Gait normal.  Psychiatric:        Mood and Affect: Mood normal.        Behavior: Behavior normal.        Thought Content: Thought content normal.        Judgment: Judgment normal.     ASSESSMENT/PLAN:   1. Chronic nonintractable headache, unspecified headache type - butalbital-acetaminophen-caffeine (FIORICET) 50-325-40 MG tablet; Take 1 tablet by mouth every 6 (six) hours as needed for headache or migraine.  Dispense: 30 tablet; Refill: 2  2. Hypothyroidism, unspecified type - Thyroid Panel With TSH   Return in about 3 months (around 06/28/2019) for thyroid.    The patient was given clear instructions to go to ER or return to medical center  if symptoms do not improve, worsen or new problems develop. The patient verbalized understanding and agreed with plan of care.   [name], FNP-BC [facility] [address] [place], [place] [address] [phone]

## 2019-03-28 NOTE — Patient Instructions (Signed)
Hypothyroidism  Hypothyroidism is when the thyroid gland does not make enough of certain hormones (it is underactive). The thyroid gland is a small gland located in the lower front part of the neck, just in front of the windpipe (trachea). This gland makes hormones that help control how the body uses food for energy (metabolism) as well as how the heart and brain function. These hormones also play a role in keeping your bones strong. When the thyroid is underactive, it produces too little of the hormones thyroxine (T4) and triiodothyronine (T3). What are the causes? This condition may be caused by:  Hashimoto's disease. This is a disease in which the body's disease-fighting system (immune system) attacks the thyroid gland. This is the most common cause.  Viral infections.  Pregnancy.  Certain medicines.  Birth defects.  Past radiation treatments to the head or neck for cancer.  Past treatment with radioactive iodine.  Past exposure to radiation in the environment.  Past surgical removal of part or all of the thyroid.  Problems with a gland in the center of the brain (pituitary gland).  Lack of enough iodine in the diet. What increases the risk? You are more likely to develop this condition if:  You are female.  You have a family history of thyroid conditions.  You use a medicine called lithium.  You take medicines that affect the immune system (immunosuppressants). What are the signs or symptoms? Symptoms of this condition include:  Feeling as though you have no energy (lethargy).  Not being able to tolerate cold.  Weight gain that is not explained by a change in diet or exercise habits.  Lack of appetite.  Dry skin.  Coarse hair.  Menstrual irregularity.  Slowing of thought processes.  Constipation.  Sadness or depression. How is this diagnosed? This condition may be diagnosed based on:  Your symptoms, your medical history, and a physical exam.  Blood  tests. You may also have imaging tests, such as an ultrasound or MRI. How is this treated? This condition is treated with medicine that replaces the thyroid hormones that your body does not make. After you begin treatment, it may take several weeks for symptoms to go away. Follow these instructions at home:  Take over-the-counter and prescription medicines only as told by your health care provider.  If you start taking any new medicines, tell your health care provider.  Keep all follow-up visits as told by your health care provider. This is important. ? As your condition improves, your dosage of thyroid hormone medicine may change. ? You will need to have blood tests regularly so that your health care provider can monitor your condition. Contact a health care provider if:  Your symptoms do not get better with treatment.  You are taking thyroid replacement medicine and you: ? Sweat a lot. ? Have tremors. ? Feel anxious. ? Lose weight rapidly. ? Cannot tolerate heat. ? Have emotional swings. ? Have diarrhea. ? Feel weak. Get help right away if you have:  Chest pain.  An irregular heartbeat.  A rapid heartbeat.  Difficulty breathing. Summary  Hypothyroidism is when the thyroid gland does not make enough of certain hormones (it is underactive).  When the thyroid is underactive, it produces too little of the hormones thyroxine (T4) and triiodothyronine (T3).  The most common cause is Hashimoto's disease, a disease in which the body's disease-fighting system (immune system) attacks the thyroid gland. The condition can also be caused by viral infections, medicine, pregnancy, or past   radiation treatment to the head or neck.  Symptoms may include weight gain, dry skin, constipation, feeling as though you do not have energy, and not being able to tolerate cold.  This condition is treated with medicine to replace the thyroid hormones that your body does not make. This information  is not intended to replace advice given to you by your health care provider. Make sure you discuss any questions you have with your health care provider. Document Released: 08/28/2005 Document Revised: 08/10/2017 Document Reviewed: 08/08/2017 Elsevier Patient Education  2020 [employer]. Migraine Headache A migraine headache is a very strong throbbing pain on one side or both sides of your head. This type of headache can also cause other symptoms. It can last from 4 hours to 3 days. Talk with your doctor about what things may bring on (trigger) this condition. What are the causes? The exact cause of this condition is not known. This condition may be triggered or caused by:  Drinking alcohol.  Smoking.  Taking medicines, such as: ? Medicine used to treat chest pain (nitroglycerin). ? Birth control pills. ? Estrogen. ? Some blood pressure medicines.  Eating or drinking certain products.  Doing physical activity. Other things that may trigger a migraine headache include:  Having a menstrual period.  Pregnancy.  Hunger.  Stress.  Not getting enough sleep or getting too much sleep.  Weather changes.  Tiredness (fatigue). What increases the risk?  Being 50-56 years old.  Being female.  Having a family history of migraine headaches.  Being Caucasian.  Having depression or anxiety.  Being very overweight. What are the signs or symptoms?  A throbbing pain. This pain may: ? Happen in any area of the head, such as on one side or both sides. ? Make it hard to do daily activities. ? Get worse with physical activity. ? Get worse around bright lights or loud noises.  Other symptoms may include: ? Feeling sick to your stomach (nauseous). ? Vomiting. ? Dizziness. ? Being sensitive to bright lights, loud noises, or smells.  Before you get a migraine headache, you may get warning signs (an aura). An aura may include: ? Seeing flashing lights or having blind spots. ?  Seeing bright spots, halos, or zigzag lines. ? Having tunnel vision or blurred vision. ? Having numbness or a tingling feeling. ? Having trouble talking. ? Having weak muscles.  Some people have symptoms after a migraine headache (postdromal phase), such as: ? Tiredness. ? Trouble thinking (concentrating). How is this treated?  Taking medicines that: ? Relieve pain. ? Relieve the feeling of being sick to your stomach. ? Prevent migraine headaches.  Treatment may also include: ? Having acupuncture. ? Avoiding foods that bring on migraine headaches. ? Learning ways to control your body functions (biofeedback). ? Therapy to help you know and deal with negative thoughts (cognitive behavioral therapy). Follow these instructions at home: Medicines  Take over-the-counter and prescription medicines only as told by your doctor.  Ask your doctor if the medicine prescribed to you: ? Requires you to avoid driving or using heavy machinery. ? Can cause trouble pooping (constipation). You may need to take these steps to prevent or treat trouble pooping:  Drink enough fluid to keep your pee (urine) pale yellow.  Take over-the-counter or prescription medicines.  Eat foods that are high in fiber. These include beans, whole grains, and fresh fruits and vegetables.  Limit foods that are high in fat and sugar. These include fried or sweet foods.  Lifestyle  Do not drink alcohol.  Do not use any products that contain nicotine or tobacco, such as cigarettes, e-cigarettes, and chewing tobacco. If you need help quitting, ask your doctor.  Get at least 8 hours of sleep every night.  Limit and deal with stress. General instructions      Keep a journal to find out what may bring on your migraine headaches. For example, write down: ? What you eat and drink. ? How much sleep you get. ? Any change in what you eat or drink. ? Any change in your medicines.  If you have a migraine headache: ?  Avoid things that make your symptoms worse, such as bright lights. ? It may help to lie down in a dark, quiet room. ? Do not drive or use heavy machinery. ? Ask your doctor what activities are safe for you.  Keep all follow-up visits as told by your doctor. This is important. Contact a doctor if:  You get a migraine headache that is different or worse than others you have had.  You have more than 15 headache days in one month. Get help right away if:  Your migraine headache gets very bad.  Your migraine headache lasts longer than 72 hours.  You have a fever.  You have a stiff neck.  You have trouble seeing.  Your muscles feel weak or like you cannot control them.  You start to lose your balance a lot.  You start to have trouble walking.  You pass out (faint).  You have a seizure. Summary  A migraine headache is a very strong throbbing pain on one side or both sides of your head. These headaches can also cause other symptoms.  This condition may be treated with medicines and changes to your lifestyle.  Keep a journal to find out what may bring on your migraine headaches.  Contact a doctor if you get a migraine headache that is different or worse than others you have had.  Contact your doctor if you have more than 15 headache days in a month. This information is not intended to replace advice given to you by your health care provider. Make sure you discuss any questions you have with your health care provider. Document Released: 06/06/2008 Document Revised: 12/20/2018 Document Reviewed: 10/10/2018 Elsevier Patient Education  2020 [employer].

## 2019-03-29 LAB — THYROID PANEL WITH TSH
Free Thyroxine Index: 1.8 (ref 1.2–4.9)
T3 Uptake Ratio: 23 % — ABNORMAL LOW (ref 24–39)
T4, Total: 7.7 ug/dL (ref 4.5–12.0)
TSH: 1.14 u[IU]/mL (ref 0.450–4.500)

## 2019-04-07 NOTE — Progress Notes (Signed)
Your labs are stable. Continue with your current medications. Please remember to keep your follow up appointment. If you have problems, questions or concerns, please make an appointment to discuss. Thanks!

## 2019-04-08 ENCOUNTER — Telehealth: Payer: Self-pay

## 2019-04-08 NOTE — Telephone Encounter (Signed)
Called and spoke with patient, advised that labs are stable and to continue current medications. Thanks!

## 2019-04-08 NOTE — Telephone Encounter (Signed)
-----   Message from Lanae Boast, [place] sent at 04/07/2019  4:33 PM EDT ----- Your labs are stable. Continue with your current medications. Please remember to keep your follow up appointment. If you have problems, questions or concerns, please make an appointment to discuss. Thanks!

## 2019-04-23 ENCOUNTER — Other Ambulatory Visit: Payer: Self-pay | Admitting: Family Medicine

## 2019-04-23 DIAGNOSIS — G8929 Other chronic pain: Secondary | ICD-10-CM

## 2019-05-21 ENCOUNTER — Encounter (HOSPITAL_COMMUNITY): Payer: Self-pay

## 2019-05-21 ENCOUNTER — Encounter (HOSPITAL_COMMUNITY): Payer: Self-pay | Admitting: *Deleted

## 2019-06-23 ENCOUNTER — Other Ambulatory Visit: Payer: Self-pay | Admitting: Family Medicine

## 2019-06-23 DIAGNOSIS — G8929 Other chronic pain: Secondary | ICD-10-CM

## 2019-06-24 NOTE — Telephone Encounter (Signed)
[name],  Can this be refilled? Was an [name] patient, scheduled to follow up with you 06/30/19. Please advise. Thank you!

## 2019-06-30 ENCOUNTER — Ambulatory Visit: Payer: Medicaid Other | Admitting: Family Medicine

## 2019-07-03 ENCOUNTER — Other Ambulatory Visit: Payer: Self-pay | Admitting: Family Medicine

## 2019-07-03 DIAGNOSIS — G8929 Other chronic pain: Secondary | ICD-10-CM

## 2019-07-18 ENCOUNTER — Other Ambulatory Visit: Payer: Self-pay | Admitting: Family Medicine

## 2019-07-18 DIAGNOSIS — E781 Pure hyperglyceridemia: Secondary | ICD-10-CM

## 2019-08-11 DIAGNOSIS — R2689 Other abnormalities of gait and mobility: Secondary | ICD-10-CM | POA: Diagnosis not present

## 2019-09-09 DIAGNOSIS — I10 Essential (primary) hypertension: Secondary | ICD-10-CM | POA: Diagnosis not present

## 2019-09-09 DIAGNOSIS — E039 Hypothyroidism, unspecified: Secondary | ICD-10-CM | POA: Diagnosis not present

## 2019-09-09 DIAGNOSIS — G43909 Migraine, unspecified, not intractable, without status migrainosus: Secondary | ICD-10-CM | POA: Diagnosis not present

## 2019-09-09 DIAGNOSIS — F341 Dysthymic disorder: Secondary | ICD-10-CM | POA: Diagnosis not present

## 2019-09-09 DIAGNOSIS — K219 Gastro-esophageal reflux disease without esophagitis: Secondary | ICD-10-CM | POA: Diagnosis not present

## 2019-09-09 DIAGNOSIS — E785 Hyperlipidemia, unspecified: Secondary | ICD-10-CM | POA: Diagnosis not present

## 2019-09-09 DIAGNOSIS — I208 Other forms of angina pectoris: Secondary | ICD-10-CM | POA: Diagnosis not present

## 2019-09-25 ENCOUNTER — Other Ambulatory Visit: Payer: Self-pay | Admitting: Family Medicine

## 2019-09-25 DIAGNOSIS — E781 Pure hyperglyceridemia: Secondary | ICD-10-CM

## 2019-09-25 NOTE — Telephone Encounter (Signed)
Pt last saw Venora Maples, can this be refilled? Please advise.

## 2019-09-26 ENCOUNTER — Other Ambulatory Visit: Payer: Self-pay | Admitting: Family Medicine

## 2019-09-26 DIAGNOSIS — G8929 Other chronic pain: Secondary | ICD-10-CM

## 2019-09-26 DIAGNOSIS — R519 Headache, unspecified: Secondary | ICD-10-CM

## 2019-10-10 ENCOUNTER — Emergency Department (HOSPITAL_COMMUNITY): Payer: Medicaid Other

## 2019-10-10 ENCOUNTER — Inpatient Hospital Stay (HOSPITAL_COMMUNITY)
Admission: EM | Admit: 2019-10-10 | Discharge: 2019-10-12 | DRG: 177 | Disposition: A | Discharge status: Home / Self Care | Payer: Medicaid Other | Attending: Family Medicine | Admitting: Family Medicine

## 2019-10-10 ENCOUNTER — Other Ambulatory Visit: Payer: Self-pay

## 2019-10-10 DIAGNOSIS — N179 Acute kidney failure, unspecified: Secondary | ICD-10-CM | POA: Diagnosis present

## 2019-10-10 DIAGNOSIS — Z79899 Other long term (current) drug therapy: Secondary | ICD-10-CM | POA: Diagnosis not present

## 2019-10-10 DIAGNOSIS — D649 Anemia, unspecified: Secondary | ICD-10-CM | POA: Diagnosis present

## 2019-10-10 DIAGNOSIS — Z79890 Hormone replacement therapy (postmenopausal): Secondary | ICD-10-CM

## 2019-10-10 DIAGNOSIS — I959 Hypotension, unspecified: Secondary | ICD-10-CM | POA: Diagnosis present

## 2019-10-10 DIAGNOSIS — R0902 Hypoxemia: Secondary | ICD-10-CM

## 2019-10-10 DIAGNOSIS — R509 Fever, unspecified: Secondary | ICD-10-CM | POA: Diagnosis not present

## 2019-10-10 DIAGNOSIS — J45909 Unspecified asthma, uncomplicated: Secondary | ICD-10-CM | POA: Diagnosis present

## 2019-10-10 DIAGNOSIS — H5462 Unqualified visual loss, left eye, normal vision right eye: Secondary | ICD-10-CM | POA: Diagnosis present

## 2019-10-10 DIAGNOSIS — I493 Ventricular premature depolarization: Secondary | ICD-10-CM

## 2019-10-10 DIAGNOSIS — R000 Tachycardia, unspecified: Secondary | ICD-10-CM | POA: Insufficient documentation

## 2019-10-10 DIAGNOSIS — G43119 Migraine with aura, intractable, without status migrainosus: Secondary | ICD-10-CM | POA: Diagnosis present

## 2019-10-10 DIAGNOSIS — J9601 Acute respiratory failure with hypoxia: Secondary | ICD-10-CM | POA: Diagnosis not present

## 2019-10-10 DIAGNOSIS — E785 Hyperlipidemia, unspecified: Secondary | ICD-10-CM | POA: Diagnosis not present

## 2019-10-10 DIAGNOSIS — K219 Gastro-esophageal reflux disease without esophagitis: Secondary | ICD-10-CM | POA: Diagnosis present

## 2019-10-10 DIAGNOSIS — Z8673 Personal history of transient ischemic attack (TIA), and cerebral infarction without residual deficits: Secondary | ICD-10-CM

## 2019-10-10 DIAGNOSIS — G43109 Migraine with aura, not intractable, without status migrainosus: Secondary | ICD-10-CM

## 2019-10-10 DIAGNOSIS — G43909 Migraine, unspecified, not intractable, without status migrainosus: Secondary | ICD-10-CM | POA: Diagnosis not present

## 2019-10-10 DIAGNOSIS — U071 COVID-19: Principal | ICD-10-CM | POA: Diagnosis present

## 2019-10-10 DIAGNOSIS — E860 Dehydration: Secondary | ICD-10-CM | POA: Diagnosis present

## 2019-10-10 DIAGNOSIS — E2740 Unspecified adrenocortical insufficiency: Secondary | ICD-10-CM | POA: Diagnosis not present

## 2019-10-10 DIAGNOSIS — F329 Major depressive disorder, single episode, unspecified: Secondary | ICD-10-CM | POA: Diagnosis not present

## 2019-10-10 DIAGNOSIS — E039 Hypothyroidism, unspecified: Secondary | ICD-10-CM | POA: Diagnosis present

## 2019-10-10 LAB — HEPATIC FUNCTION PANEL
ALT: 29 U/L (ref 0–44)
AST: 44 U/L — ABNORMAL HIGH (ref 15–41)
Albumin: 3.5 g/dL (ref 3.5–5.0)
Alkaline Phosphatase: 56 U/L (ref 38–126)
Bilirubin, Direct: 0.1 mg/dL (ref 0.0–0.2)
Indirect Bilirubin: 0.5 mg/dL (ref 0.3–0.9)
Total Bilirubin: 0.6 mg/dL (ref 0.3–1.2)
Total Protein: 7.1 g/dL (ref 6.5–8.1)

## 2019-10-10 LAB — D-DIMER, QUANTITATIVE: D-Dimer, Quant: 1.45 ug/mL-FEU — ABNORMAL HIGH (ref 0.00–0.50)

## 2019-10-10 LAB — CBC
HCT: 48.5 % — ABNORMAL HIGH (ref 36.0–46.0)
Hemoglobin: 16 g/dL — ABNORMAL HIGH (ref 12.0–15.0)
MCH: 30 pg (ref 26.0–34.0)
MCHC: 33 g/dL (ref 30.0–36.0)
MCV: 90.8 fL (ref 80.0–100.0)
Platelets: 286 10*3/uL (ref 150–400)
RBC: 5.34 MIL/uL — ABNORMAL HIGH (ref 3.87–5.11)
RDW: 13.2 % (ref 11.5–15.5)
WBC: 5.1 10*3/uL (ref 4.0–10.5)
nRBC: 0 % (ref 0.0–0.2)

## 2019-10-10 LAB — TRIGLYCERIDES: Triglycerides: 198 mg/dL — ABNORMAL HIGH (ref <150)

## 2019-10-10 LAB — PROCALCITONIN: Procalcitonin: <0.1 ng/mL

## 2019-10-10 LAB — C-REACTIVE PROTEIN: CRP: 4.8 mg/dL — ABNORMAL HIGH (ref <1.0)

## 2019-10-10 LAB — FERRITIN: Ferritin: 331 ng/mL — ABNORMAL HIGH (ref 11–307)

## 2019-10-10 LAB — MAGNESIUM: Magnesium: 1.7 mg/dL (ref 1.7–2.4)

## 2019-10-10 LAB — TROPONIN I (HIGH SENSITIVITY): Troponin I (High Sensitivity): 7 ng/L (ref <18)

## 2019-10-10 LAB — BASIC METABOLIC PANEL
Anion gap: 15 (ref 5–15)
BUN: 18 mg/dL (ref 8–23)
CO2: 19 mmol/L — ABNORMAL LOW (ref 22–32)
Calcium: 9.9 mg/dL (ref 8.9–10.3)
Chloride: 96 mmol/L — ABNORMAL LOW (ref 98–111)
Creatinine, Ser: 1.2 mg/dL — ABNORMAL HIGH (ref 0.44–1.00)
GFR calc Af Amer: 56 mL/min — ABNORMAL LOW (ref >60)
GFR calc non Af Amer: 49 mL/min — ABNORMAL LOW (ref >60)
Glucose, Bld: 75 mg/dL (ref 70–99)
Potassium: 4.2 mmol/L (ref 3.5–5.1)
Sodium: 130 mmol/L — ABNORMAL LOW (ref 135–145)

## 2019-10-10 LAB — TSH: TSH: 0.828 u[IU]/mL (ref 0.350–4.500)

## 2019-10-10 LAB — LACTIC ACID, PLASMA: Lactic Acid, Venous: 1.4 mmol/L (ref 0.5–1.9)

## 2019-10-10 LAB — FIBRINOGEN: Fibrinogen: 410 mg/dL (ref 210–475)

## 2019-10-10 LAB — LACTATE DEHYDROGENASE: LDH: 188 U/L (ref 98–192)

## 2019-10-10 LAB — POC SARS CORONAVIRUS 2 AG -  ED: SARS Coronavirus 2 Ag: POSITIVE — AB

## 2019-10-10 LAB — HIV ANTIBODY (ROUTINE TESTING W REFLEX): HIV Screen 4th Generation wRfx: NONREACTIVE

## 2019-10-10 MED ORDER — MORPHINE SULFATE (PF) 4 MG/ML IV SOLN
4.0000 mg | Freq: Once | INTRAVENOUS | Status: AC
  Administered 2019-10-10: 4 mg via INTRAVENOUS
  Filled 2019-10-10: qty 1

## 2019-10-10 MED ORDER — PANTOPRAZOLE SODIUM 40 MG PO TBEC
40.0000 mg | DELAYED_RELEASE_TABLET | Freq: Every day | ORAL | Status: DC
  Administered 2019-10-11 – 2019-10-12 (×2): 40 mg via ORAL
  Filled 2019-10-10 (×2): qty 1

## 2019-10-10 MED ORDER — SODIUM CHLORIDE 0.9 % IV SOLN
100.0000 mg | Freq: Every day | INTRAVENOUS | Status: DC
  Administered 2019-10-11 – 2019-10-12 (×2): 100 mg via INTRAVENOUS
  Filled 2019-10-10 (×3): qty 20

## 2019-10-10 MED ORDER — MORPHINE SULFATE (PF) 4 MG/ML IV SOLN
4.0000 mg | Freq: Once | INTRAVENOUS | Status: AC
  Administered 2019-10-10: 16:00:00 4 mg via INTRAVENOUS
  Filled 2019-10-10: qty 1

## 2019-10-10 MED ORDER — HYDROCORTISONE NA SUCCINATE PF 100 MG IJ SOLR
100.0000 mg | Freq: Once | INTRAMUSCULAR | Status: AC
  Administered 2019-10-10: 100 mg via INTRAVENOUS
  Filled 2019-10-10: qty 2

## 2019-10-10 MED ORDER — METOCLOPRAMIDE HCL 5 MG/ML IJ SOLN
10.0000 mg | Freq: Once | INTRAMUSCULAR | Status: AC
  Administered 2019-10-10: 10 mg via INTRAVENOUS
  Filled 2019-10-10: qty 2

## 2019-10-10 MED ORDER — ROSUVASTATIN CALCIUM 5 MG PO TABS
10.0000 mg | ORAL_TABLET | Freq: Every day | ORAL | Status: DC
  Administered 2019-10-11 – 2019-10-12 (×2): 10 mg via ORAL
  Filled 2019-10-10 (×2): qty 2

## 2019-10-10 MED ORDER — ONDANSETRON HCL 4 MG/2ML IJ SOLN
4.0000 mg | Freq: Once | INTRAMUSCULAR | Status: AC
  Administered 2019-10-10: 4 mg via INTRAVENOUS
  Filled 2019-10-10: qty 2

## 2019-10-10 MED ORDER — SODIUM CHLORIDE 0.9 % IV BOLUS
1000.0000 mL | Freq: Once | INTRAVENOUS | Status: AC
  Administered 2019-10-10: 1000 mL via INTRAVENOUS

## 2019-10-10 MED ORDER — VENLAFAXINE HCL ER 75 MG PO CP24
150.0000 mg | ORAL_CAPSULE | Freq: Every day | ORAL | Status: DC
  Administered 2019-10-11 – 2019-10-12 (×2): 150 mg via ORAL
  Filled 2019-10-10 (×2): qty 2

## 2019-10-10 MED ORDER — LEVOTHYROXINE SODIUM 75 MCG PO TABS
75.0000 ug | ORAL_TABLET | Freq: Every day | ORAL | Status: DC
  Administered 2019-10-11 – 2019-10-12 (×2): 75 ug via ORAL
  Filled 2019-10-10 (×2): qty 1

## 2019-10-10 MED ORDER — BUTALBITAL-APAP-CAFFEINE 50-325-40 MG PO TABS
1.0000 | ORAL_TABLET | Freq: Four times a day (QID) | ORAL | Status: DC | PRN
  Administered 2019-10-11: 1 via ORAL
  Filled 2019-10-10: qty 1

## 2019-10-10 MED ORDER — DEXAMETHASONE SODIUM PHOSPHATE 10 MG/ML IJ SOLN
6.0000 mg | INTRAMUSCULAR | Status: DC
  Administered 2019-10-11 – 2019-10-12 (×2): 6 mg via INTRAVENOUS
  Filled 2019-10-10 (×2): qty 1

## 2019-10-10 MED ORDER — ENOXAPARIN SODIUM 40 MG/0.4ML ~~LOC~~ SOLN
40.0000 mg | SUBCUTANEOUS | Status: DC
  Administered 2019-10-10: 40 mg via SUBCUTANEOUS
  Filled 2019-10-10: qty 0.4

## 2019-10-10 MED ORDER — DIPHENHYDRAMINE HCL 50 MG/ML IJ SOLN: 25.0000 mg | Freq: Once | INTRAMUSCULAR | Status: DC | PRN

## 2019-10-10 MED ORDER — SODIUM CHLORIDE 0.9 % IV SOLN
200.0000 mg | Freq: Once | INTRAVENOUS | Status: AC
  Administered 2019-10-10 – 2019-10-11 (×2): 200 mg via INTRAVENOUS
  Filled 2019-10-10: qty 40

## 2019-10-10 MED ORDER — DIPHENHYDRAMINE HCL 50 MG/ML IJ SOLN
12.5000 mg | Freq: Once | INTRAMUSCULAR | Status: AC
  Administered 2019-10-10: 12.5 mg via INTRAVENOUS
  Filled 2019-10-10: qty 1

## 2019-10-10 MED ORDER — PROCHLORPERAZINE EDISYLATE 10 MG/2ML IJ SOLN
10.0000 mg | Freq: Once | INTRAMUSCULAR | Status: AC | PRN
  Administered 2019-10-11: 10 mg via INTRAVENOUS
  Filled 2019-10-10: qty 2

## 2019-10-10 MED ORDER — POLYETHYLENE GLYCOL 3350 17 G PO PACK: 17.0000 g | PACK | Freq: Every day | ORAL | Status: DC | PRN

## 2019-10-10 MED ORDER — SODIUM CHLORIDE 0.9 % IV SOLN: INTRAVENOUS | Status: AC

## 2019-10-10 NOTE — Progress Notes (Signed)
Attempted to reach son to give update, no response. Left VM to call RN station and I would be happy to call back.   Dalphine Handing, PGY-3 [facility] 10/10/2019 10:55 PM

## 2019-10-10 NOTE — ED Provider Notes (Signed)
  Physical Exam  BP 113/82   Pulse (!) 111   Temp (!) 100.8 F (38.2 C) (Oral)   Resp 15   SpO2 99%   Physical Exam  ED Course/Procedures     Procedures  MDM  Received patient in signout.  Headache with history of migraines.  Continued pain.  Had tachycardia.  There had been no temperature done but upon checking it 6 hours after arrival did have a temperature of 100.8.  We will add more blood work now.  We will also get Covid test and x-ray.  Covid has come back positive.  With hypotension and signs of dehydration fluid bolus given.  Also found to be hypoxic.  Sats down to 84 during migraine treatment and thought to be secondary to medications, however sats right around 90 at rest.  With this will require admission to the hospital.  Will discussed with unassigned medicine.       Davonna Belling, MD 10/10/19 [phone]

## 2019-10-10 NOTE — ED Notes (Signed)
+  Tele Kashena Whidby son [id number] looking for an update on the patient
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
## 2019-10-10 NOTE — ED Provider Notes (Signed)
[facility] EMERGENCY DEPARTMENT Provider Note   CSN: [id number] Arrival date & time: 10/10/19  1249     History Chief Complaint  Patient presents with  . Migraine    [name] is a 64 y.o. female.  Pt presents to the ED today with a headache.  She has a hx of migraines since a head injury (hit in the head with a 2 by 4).  She has had nausea and has not been able to eat or drink due to the severe nausea.  This headache has been going on for 2 days.         Past Medical History:  Diagnosis Date  . 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 Amblyopia   . Anemia   . Arthritis    "in my lower back"  . Asthma   . Atrial myxoma   . Blindness of left eye 1978  . Blood transfusion   . Breast cyst   . Constipation   . 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 CVA (cerebral infarction)   . Depression   . Depression   . 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 Hemorrhoids   . Hyperlipemia   . Hypothyroidism    per office note dated 03/25/2014  . Migraine   . Peptic ulcer associated with Helicobacter pylori infection   . Reflux   . Sinus headache   . Stomach ulcer    "from the hydrocortisone"  . Stroke [facility]) 1978   "piece tumor broke off & went to left eye; leaving me blind"  . Thyroid disease    pt unsure if hyper or hypo???    Patient Active Problem List   Diagnosis Date Noted  . ARF (acute renal failure) ([place]) 12/06/2018  . Fever 12/06/2018  . Acute hypoxemic respiratory failure ([place]) 12/06/2018  . Headache 11/02/2013  . Dizziness 11/02/2013  . Anemia 01/10/2013  . Chest pain, unspecified 01/10/2013  . Adjustment disorder with mixed anxiety and depressed mood 01/10/2013  . Adrenal crisis ([place]) 01/07/2013  . Nausea & vomiting 08/03/2011  . Hypotension 08/03/2011  . Adrenal insufficiency ([place]) 08/03/2011  . Hyponatremia 08/03/2011  . Hypothyroidism 08/03/2011  . Depression 08/03/2011    Past Surgical History:  Procedure Laterality Date  . ADRENAL GLAND  SURGERY  1982   on hydrocortisone  . ATRIAL MYXOMA EXCISION  1978  . BREAST BIOPSY  07/1999   ductal tissue excision & bx right; bx only left breast  . EYE SURGERY    . LEFT HEART CATHETERIZATION WITH CORONARY ANGIOGRAM N/A 10/06/2011   Procedure: LEFT HEART CATHETERIZATION WITH CORONARY ANGIOGRAM;  Surgeon: Clent Demark, MD;  Location: [facility] CATH LAB;  Service: Cardiovascular;  Laterality: N/A;  . repaired cross eyed     "as a child"  . ROTATOR CUFF REPAIR  05/2005   left  . VAGINAL HYSTERECTOMY  2000   "partial; w/left ovary"     OB History   No obstetric history on file.     Family History  Problem Relation Age of Onset  . Colon cancer Sister 72  . Acromegaly Father   . Heart disease Father        Tumor in the heart  . Stomach cancer Father   . Diabetes Sister   . Kidney disease Sister        x2  . Heart disease Sister        Tumor in the heart  . Heart disease Daughter  Tumor in the heart  . Colon polyps Neg Hx   . Esophageal cancer Neg Hx   . Gallbladder disease Neg Hx     Social History   Tobacco Use  . Smoking status: Never Smoker  . Smokeless tobacco: Never Used  Substance Use Topics  . Alcohol use: No  . Drug use: No    Home Medications Prior to Admission medications   Medication Sig Start Date End Date Taking? Authorizing Provider  fenofibrate (TRICOR) 48 MG tablet Take 1 tablet (48 mg total) by mouth daily. 09/26/19  Yes Tresa Garter, MD  butalbital-acetaminophen-caffeine (FIORICET) 50-325-40 MG tablet Take 1 tablet by mouth every 6 (six) hours as needed for headache or migraine. 04/23/19 04/22/20  Lanae Boast, FNP  hydrocortisone (CORTEF) 20 MG tablet Take 0.5-1 tablets (10-20 mg total) by mouth 2 (two) times daily. Take 1 tablet every morning and 1/2 tablet every evening Patient taking differently: Take 20 mg by mouth 2 (two) times daily.  [id number]   Delora Fuel, MD  levothyroxine (SYNTHROID) 50 MCG tablet Take 1 tablet (50 mcg total)  by mouth daily before breakfast. 12/30/18   Lanae Boast, FNP  metoprolol tartrate (LOPRESSOR) 25 MG tablet Take 1 tablet (25 mg total) by mouth daily. 12/09/18   Danford, Suann Larry, MD  omega-3 acid ethyl esters (LOVAZA) 1 g capsule Take 1 capsule (1 g total) by mouth 2 (two) times daily. 07/18/19   Tresa Garter, MD  omeprazole (PRILOSEC) 20 MG capsule Take 1 capsule (20 mg total) by mouth daily. 12/27/18   Lanae Boast, FNP  rosuvastatin (CRESTOR) 10 MG tablet Take 10 mg by mouth daily. 09/24/18   [provider]  traZODone (DESYREL) 50 MG tablet Take 0.5-1 tablets (25-50 mg total) by mouth at bedtime as needed for sleep. Patient taking differently: Take 50 mg by mouth at bedtime as needed for sleep.  09/27/18   Lanae Boast, FNP  venlafaxine XR (EFFEXOR-XR) 150 MG 24 hr capsule Take 1 capsule (150 mg total) by mouth daily. 09/27/18   Lanae Boast, FNP    Allergies    Aspirin, Ibuprofen [ibuprofen], and Penicillins  Review of Systems   Review of Systems  Gastrointestinal: Positive for nausea.  Neurological: Positive for headaches.  All other systems reviewed and are negative.   Physical Exam Updated Vital Signs BP 91/65 (BP Location: Right Arm)   Pulse (!) 112   Resp 18   SpO2 96%   Physical Exam Vitals and nursing note reviewed.  Constitutional:      Appearance: Normal appearance.  HENT:     Head: Normocephalic and atraumatic.     Right Ear: External ear normal.     Left Ear: External ear normal.     Nose: Nose normal.     Mouth/Throat:     Mouth: Mucous membranes are dry.  Eyes:     Extraocular Movements: Extraocular movements intact.     Conjunctiva/sclera: Conjunctivae normal.     Pupils: Pupils are equal, round, and reactive to light.  Cardiovascular:     Rate and Rhythm: Regular rhythm. Tachycardia present.     Pulses: Normal pulses.     Heart sounds: Normal heart sounds.  Pulmonary:     Effort: Pulmonary effort is normal.     Breath  sounds: Normal breath sounds.  Abdominal:     General: Abdomen is flat. Bowel sounds are normal.     Palpations: Abdomen is soft.  Musculoskeletal:  General: Normal range of motion.     Cervical back: Normal range of motion and neck supple.  Skin:    General: Skin is warm.     Capillary Refill: Capillary refill takes less than 2 seconds.  Neurological:     General: No focal deficit present.     Mental Status: She is alert and oriented to person, place, and time.  Psychiatric:        Mood and Affect: Mood normal.        Behavior: Behavior normal.        Thought Content: Thought content normal.        Judgment: Judgment normal.     ED Results / Procedures / Treatments   Labs (all labs ordered are listed, but only abnormal results are displayed) Labs Reviewed  BASIC METABOLIC PANEL - Abnormal; Notable for the following components:      Result Value   Sodium 130 (*)    Chloride 96 (*)    CO2 19 (*)    Creatinine, Ser 1.20 (*)    GFR calc non Af Amer 49 (*)    GFR calc Af Amer 56 (*)    All other components within normal limits  CBC - Abnormal; Notable for the following components:   RBC 5.34 (*)    Hemoglobin 16.0 (*)    HCT 48.5 (*)    All other components within normal limits  MAGNESIUM    EKG EKG Interpretation  Date/Time:  Friday October 10 2019 12:52:34 EST Ventricular Rate:  123 PR Interval:    QRS Duration: 77 QT Interval:  297 QTC Calculation: 422 R Axis:   100 Text Interpretation: Sinus tachycardia Ventricular tachycardia, unsustained Aberrant conduction of SV complex(es) Right axis deviation No significant change since last tracing Confirmed by Isla Pence [phone]) on 10/10/2019 1:37:26 PM   Radiology No results found.  Procedures Procedures (including critical care time)  Medications Ordered in ED Medications  sodium chloride 0.9 % bolus 1,000 mL (1,000 mLs Intravenous New Bag/Given 10/10/19 1328)    And  0.9 %  sodium chloride infusion  (has no administration in time range)  sodium chloride 0.9 % bolus 1,000 mL (has no administration in time range)  ondansetron (ZOFRAN) injection 4 mg (has no administration in time range)  morphine 4 MG/ML injection 4 mg (has no administration in time range)  metoCLOPramide (REGLAN) injection 10 mg (10 mg Intravenous Given 10/10/19 1319)  diphenhydrAMINE (BENADRYL) injection 12.5 mg (12.5 mg Intravenous Given 10/10/19 1324)  morphine 4 MG/ML injection 4 mg (4 mg Intravenous Given 10/10/19 1321)    ED Course  I have reviewed the triage vital signs and the nursing notes.  Pertinent labs & imaging results that were available during my care of the patient were reviewed by me and considered in my medical decision making (see chart for details).    MDM Rules/Calculators/A&P                     Pt's pain has improved, but is still there.   She is still slightly tachycardic, so I ordered a head CT and more fluids an more pain meds.   Pt is having frequent PVCs.  She said she's had them in the past.  Pt to f/u with cards.  Pt is slightly hyponatremic, likely from adrenal dysfunction for which she takes solu-cortef.   Pt signed out to Dr. Alvino Chapel at shift change.  Final Clinical Impression(s) / ED Diagnoses Final diagnoses:  Migraine with aura and without status migrainosus, not intractable  Frequent PVCs    Rx / DC Orders ED Discharge Orders    None       Isla Pence, MD 10/10/19 1541

## 2019-10-10 NOTE — ED Notes (Signed)
Patient transported to CT 

## 2019-10-10 NOTE — Discharge Instructions (Addendum)
Thank you so much for allowing [place] to be part of your care.  You were admitted to [facility] due to having a severe migraine and decrease in your oxygen levels in the setting of COVID-19 infection.  Fortunately while you are here you received some fluids, Decadron, remdesivir, and migraine medications with significant improvements.  As you continue to recover from Covid, we encourage you to stay active but not overdo this.  We recommend a 21-day isolation from 1/29 due to your Covid infection requiring hospitalization.  This would be finished by 10/31/2019.  We have discharge you home on a higher dose of steroids, after this course is finished recommend you restart your hydrocortisone home dose.  Please do not take your home hydrocortisone until you have completed the course of Decadron.  We have set you up with Covid at home, which will be a program monitoring your oxygen and checking in to see how you are doing.  If you develop any difficulty breathing, chest pain, confusion, or new onset weakness/numbness please seek medical care/ED right away.  Please make sure you follow-up with your primary care provider within the next week.    COVID-19 COVID-19 is a respiratory infection that is caused by a virus called severe acute respiratory syndrome coronavirus 2 (SARS-CoV-2). The disease is also known as coronavirus disease or novel coronavirus. In some people, the virus may not cause any symptoms. In others, it may cause a serious infection. The infection can get worse quickly and can lead to complications, such as:  Pneumonia, or infection of the lungs.  Acute respiratory distress syndrome or ARDS. This is a condition in which fluid build-up in the lungs prevents the lungs from filling with air and passing oxygen into the blood.  Acute respiratory failure. This is a condition in which there is not enough oxygen passing from the lungs to the body or when carbon dioxide is not passing from the  lungs out of the body.  Sepsis or septic shock. This is a serious bodily reaction to an infection.  Blood clotting problems.  Secondary infections due to bacteria or fungus.  Organ failure. This is when your body's organs stop working. The virus that causes COVID-19 is contagious. This means that it can spread from person to person through droplets from coughs and sneezes (respiratory secretions). What are the causes? This illness is caused by a virus. You may catch the virus by:  Breathing in droplets from an infected person. Droplets can be spread by a person breathing, speaking, singing, coughing, or sneezing.  Touching something, like a table or a doorknob, that was exposed to the virus (contaminated) and then touching your mouth, nose, or eyes. What increases the risk? Risk for infection You are more likely to be infected with this virus if you:  Are within 6 feet (2 meters) of a person with COVID-19.  Provide care for or live with a person who is infected with COVID-19.  Spend time in crowded indoor spaces or live in shared housing. Risk for serious illness You are more likely to become seriously ill from the virus if you:  Are 79 years of age or older. The higher your age, the more you are at risk for serious illness.  Live in a nursing home or long-term care facility.  Have cancer.  Have a long-term (chronic) disease such as: ? Chronic lung disease, including chronic obstructive pulmonary disease or asthma. ? A long-term disease that lowers your body's ability to  fight infection (immunocompromised). ? Heart disease, including heart failure, a condition in which the arteries that lead to the heart become narrow or blocked (coronary artery disease), a disease which makes the heart muscle thick, weak, or stiff (cardiomyopathy). ? Diabetes. ? Chronic kidney disease. ? Sickle cell disease, a condition in which red blood cells have an abnormal "sickle" shape. ? Liver  disease.  Are obese. What are the signs or symptoms? Symptoms of this condition can range from mild to severe. Symptoms may appear any time from 2 to 14 days after being exposed to the virus. They include:  A fever or chills.  A cough.  Difficulty breathing.  Headaches, body aches, or muscle aches.  Runny or stuffy (congested) nose.  A sore throat.  New loss of taste or smell. Some people may also have stomach problems, such as nausea, vomiting, or diarrhea. Other people may not have any symptoms of COVID-19. How is this diagnosed? This condition may be diagnosed based on:  Your signs and symptoms, especially if: ? You live in an area with a COVID-19 outbreak. ? You recently traveled to or from an area where the virus is common. ? You provide care for or live with a person who was diagnosed with COVID-19. ? You were exposed to a person who was diagnosed with COVID-19.  A physical exam.  Lab tests, which may include: ? Taking a sample of fluid from the back of your nose and throat (nasopharyngeal fluid), your nose, or your throat using a swab. ? A sample of mucus from your lungs (sputum). ? Blood tests.  Imaging tests, which may include, X-rays, CT scan, or ultrasound. How is this treated? At present, there is no medicine to treat COVID-19. Medicines that treat other diseases are being used on a trial basis to see if they are effective against COVID-19. Your health care provider will talk with you about ways to treat your symptoms. For most people, the infection is mild and can be managed at home with rest, fluids, and over-the-counter medicines. Treatment for a serious infection usually takes places in a hospital intensive care unit (ICU). It may include one or more of the following treatments. These treatments are given until your symptoms improve.  Receiving fluids and medicines through an IV.  Supplemental oxygen. Extra oxygen is given through a tube in the nose, a  face mask, or a hood.  Positioning you to lie on your stomach (prone position). This makes it easier for oxygen to get into the lungs.  Continuous positive airway pressure (CPAP) or bi-level positive airway pressure (BPAP) machine. This treatment uses mild air pressure to keep the airways open. A tube that is connected to a motor delivers oxygen to the body.  Ventilator. This treatment moves air into and out of the lungs by using a tube that is placed in your windpipe.  Tracheostomy. This is a procedure to create a hole in the neck so that a breathing tube can be inserted.  Extracorporeal membrane oxygenation (ECMO). This procedure gives the lungs a chance to recover by taking over the functions of the heart and lungs. It supplies oxygen to the body and removes carbon dioxide. Follow these instructions at home: Lifestyle  If you are sick, stay home except to get medical care. Your health care provider will tell you how long to stay home. Call your health care provider before you go for medical care.  Rest at home as told by your health care provider.  Do not use any products that contain nicotine or tobacco, such as cigarettes, e-cigarettes, and chewing tobacco. If you need help quitting, ask your health care provider.  Return to your normal activities as told by your health care provider. Ask your health care provider what activities are safe for you. General instructions  Take over-the-counter and prescription medicines only as told by your health care provider.  Drink enough fluid to keep your urine pale yellow.  Keep all follow-up visits as told by your health care provider. This is important. How is this prevented?  There is no vaccine to help prevent COVID-19 infection. However, there are steps you can take to protect yourself and others from this virus. To protect yourself:   Do not travel to areas where COVID-19 is a risk. The areas where COVID-19 is reported change often. To  identify high-risk areas and travel restrictions, check the CDC travel website: FatFares.com.br  If you live in, or must travel to, an area where COVID-19 is a risk, take precautions to avoid infection. ? Stay away from people who are sick. ? Wash your hands often with soap and water for 20 seconds. If soap and water are not available, use an alcohol-based hand sanitizer. ? Avoid touching your mouth, face, eyes, or nose. ? Avoid going out in public, follow guidance from your state and local health authorities. ? If you must go out in public, wear a cloth face covering or face mask. Make sure your mask covers your nose and mouth. ? Avoid crowded indoor spaces. Stay at least 6 feet (2 meters) away from others. ? Disinfect objects and surfaces that are frequently touched every day. This may include:  Counters and tables.  Doorknobs and light switches.  Sinks and faucets.  Electronics, such as phones, remote controls, keyboards, computers, and tablets. To protect others: If you have symptoms of COVID-19, take steps to prevent the virus from spreading to others.  If you think you have a COVID-19 infection, contact your health care provider right away. Tell your health care team that you think you may have a COVID-19 infection.  Stay home. Leave your house only to seek medical care. Do not use public transport.  Do not travel while you are sick.  Wash your hands often with soap and water for 20 seconds. If soap and water are not available, use alcohol-based hand sanitizer.  Stay away from other members of your household. Let healthy household members care for children and pets, if possible. If you have to care for children or pets, wash your hands often and wear a mask. If possible, stay in your own room, separate from others. Use a different bathroom.  Make sure that all people in your household wash their hands well and often.  Cough or sneeze into a tissue or your sleeve or  elbow. Do not cough or sneeze into your hand or into the air.  Wear a cloth face covering or face mask. Make sure your mask covers your nose and mouth. Where to find more information  Centers for Disease Control and Prevention: PurpleGadgets.be  World Health Organization: https://www.castaneda.info/ Contact a health care provider if:  You live in or have traveled to an area where COVID-19 is a risk and you have symptoms of the infection.  You have had contact with someone who has COVID-19 and you have symptoms of the infection. Get help right away if:  You have trouble breathing.  You have pain or pressure in your chest.  You have confusion.  You have bluish lips and fingernails.  You have difficulty waking from sleep.  You have symptoms that get worse. These symptoms may represent a serious problem that is an emergency. Do not wait to see if the symptoms will go away. Get medical help right away. Call your local emergency services (911 in the [place] Do not drive yourself to the hospital. Let the emergency medical personnel know if you think you have COVID-19. Summary  COVID-19 is a respiratory infection that is caused by a virus. It is also known as coronavirus disease or novel coronavirus. It can cause serious infections, such as pneumonia, acute respiratory distress syndrome, acute respiratory failure, or sepsis.  The virus that causes COVID-19 is contagious. This means that it can spread from person to person through droplets from breathing, speaking, singing, coughing, or sneezing.  You are more likely to develop a serious illness if you are 27 years of age or older, have a weak immune system, live in a nursing home, or have chronic disease.  There is no medicine to treat COVID-19. Your health care provider will talk with you about ways to treat your symptoms.  Take steps to protect yourself and others from infection. Wash your hands  often and disinfect objects and surfaces that are frequently touched every day. Stay away from people who are sick and wear a mask if you are sick. This information is not intended to replace advice given to you by your health care provider. Make sure you discuss any questions you have with your health care provider. Document Revised: 06/27/2019 Document Reviewed: 10/03/2018 Elsevier Patient Education  [place].

## 2019-10-10 NOTE — ED Triage Notes (Signed)
Pt BIB [facility] w/ c/o migraine starting this morning. Pt has hx of the same, took her medicine w/o relief. Reports nausea, no vomiting. Tachy 132 bpm on arrival. No fever. Pt has been unable to eat or drink for the past couple days. Pt experiencing light sensitivity.

## 2019-10-11 ENCOUNTER — Other Ambulatory Visit: Payer: Self-pay

## 2019-10-11 DIAGNOSIS — I493 Ventricular premature depolarization: Secondary | ICD-10-CM

## 2019-10-11 LAB — COMPREHENSIVE METABOLIC PANEL
ALT: 26 U/L (ref 0–44)
AST: 39 U/L (ref 15–41)
Albumin: 3.2 g/dL — ABNORMAL LOW (ref 3.5–5.0)
Alkaline Phosphatase: 49 U/L (ref 38–126)
Anion gap: 12 (ref 5–15)
BUN: 14 mg/dL (ref 8–23)
CO2: 19 mmol/L — ABNORMAL LOW (ref 22–32)
Calcium: 8.1 mg/dL — ABNORMAL LOW (ref 8.9–10.3)
Chloride: 102 mmol/L (ref 98–111)
Creatinine, Ser: 1.15 mg/dL — ABNORMAL HIGH (ref 0.44–1.00)
GFR calc Af Amer: 59 mL/min — ABNORMAL LOW (ref >60)
GFR calc non Af Amer: 51 mL/min — ABNORMAL LOW (ref >60)
Glucose, Bld: 119 mg/dL — ABNORMAL HIGH (ref 70–99)
Potassium: 4.7 mmol/L (ref 3.5–5.1)
Sodium: 133 mmol/L — ABNORMAL LOW (ref 135–145)
Total Bilirubin: 0.3 mg/dL (ref 0.3–1.2)
Total Protein: 6.2 g/dL — ABNORMAL LOW (ref 6.5–8.1)

## 2019-10-11 LAB — CBC WITH DIFFERENTIAL/PLATELET
Abs Immature Granulocytes: 0.02 10*3/uL (ref 0.00–0.07)
Basophils Absolute: 0 10*3/uL (ref 0.0–0.1)
Basophils Relative: 0 %
Eosinophils Absolute: 0 10*3/uL (ref 0.0–0.5)
Eosinophils Relative: 0 %
HCT: 42.5 % (ref 36.0–46.0)
Hemoglobin: 13.2 g/dL (ref 12.0–15.0)
Immature Granulocytes: 0 %
Lymphocytes Relative: 16 %
Lymphs Abs: 1.2 10*3/uL (ref 0.7–4.0)
MCH: 29.7 pg (ref 26.0–34.0)
MCHC: 31.1 g/dL (ref 30.0–36.0)
MCV: 95.5 fL (ref 80.0–100.0)
Monocytes Absolute: 0.2 10*3/uL (ref 0.1–1.0)
Monocytes Relative: 3 %
Neutro Abs: 6 10*3/uL (ref 1.7–7.7)
Neutrophils Relative %: 81 %
Platelets: 269 10*3/uL (ref 150–400)
RBC: 4.45 MIL/uL (ref 3.87–5.11)
RDW: 13.3 % (ref 11.5–15.5)
WBC: 7.4 10*3/uL (ref 4.0–10.5)
nRBC: 0 % (ref 0.0–0.2)

## 2019-10-11 LAB — URINALYSIS, ROUTINE W REFLEX MICROSCOPIC
Bacteria, UA: NONE SEEN
Bilirubin Urine: NEGATIVE
Glucose, UA: NEGATIVE mg/dL
Ketones, ur: NEGATIVE mg/dL
Leukocytes,Ua: NEGATIVE
Nitrite: NEGATIVE
Protein, ur: NEGATIVE mg/dL
Specific Gravity, Urine: 1.006 (ref 1.005–1.030)
pH: 5 (ref 5.0–8.0)

## 2019-10-11 LAB — ABO/RH: ABO/RH(D): A POS

## 2019-10-11 LAB — D-DIMER, QUANTITATIVE: D-Dimer, Quant: 1.47 ug/mL-FEU — ABNORMAL HIGH (ref 0.00–0.50)

## 2019-10-11 LAB — C-REACTIVE PROTEIN: CRP: 5.5 mg/dL — ABNORMAL HIGH (ref <1.0)

## 2019-10-11 MED ORDER — SODIUM CHLORIDE 0.9 % IV BOLUS
1000.0000 mL | Freq: Once | INTRAVENOUS | Status: AC
  Administered 2019-10-11: 1000 mL via INTRAVENOUS

## 2019-10-11 MED ORDER — ORAL CARE MOUTH RINSE
15.0000 mL | Freq: Two times a day (BID) | OROMUCOSAL | Status: DC
  Administered 2019-10-11 – 2019-10-12 (×4): 15 mL via OROMUCOSAL

## 2019-10-11 MED ORDER — ENOXAPARIN SODIUM 60 MG/0.6ML ~~LOC~~ SOLN
50.0000 mg | SUBCUTANEOUS | Status: DC
  Administered 2019-10-11: 50 mg via SUBCUTANEOUS
  Filled 2019-10-11: qty 0.6

## 2019-10-11 MED ORDER — FENOFIBRATE 54 MG PO TABS
54.0000 mg | ORAL_TABLET | Freq: Every day | ORAL | Status: DC
  Administered 2019-10-11 – 2019-10-12 (×2): 54 mg via ORAL
  Filled 2019-10-11 (×2): qty 1

## 2019-10-11 NOTE — Progress Notes (Signed)
Patient arrived to 5w37 from ED. Skin assessment performed with Alveria Apley RN. Skin WDL. 22G left forearm, clean, dry and intact, infusing. Patient received meal and  drink. Family member (son) updated by the er nurse before transferring to 5W. CCMD called and updated regarding patient arrival, oxygen and telemetry connected. Will continue to monitor.

## 2019-10-11 NOTE — Progress Notes (Signed)
SATURATION QUALIFICATIONS: (This note is used to comply with regulatory documentation for home oxygen)  Patient Saturations on Room Air at Rest = 100%  Patient Saturations on Room Air while Ambulating = 96%  Patient Saturations on 0 Liters of oxygen while Ambulating = 96%  Please briefly explain why patient needs home oxygen: I don't think she needs home oxygen

## 2019-10-11 NOTE — Plan of Care (Signed)
  Problem: Education: Goal: Knowledge of risk factors and measures for prevention of condition will improve Outcome: Progressing   Problem: Coping: Goal: Psychosocial and spiritual needs will be supported Outcome: Progressing   Problem: Respiratory: Goal: Will maintain a patent airway Outcome: Progressing Goal: Complications related to the disease process, condition or treatment will be avoided or minimized Outcome: Progressing   

## 2019-10-11 NOTE — Progress Notes (Signed)
After walking in [place] ft on room air, hr 101 and oxygen level 93%.

## 2019-10-11 NOTE — Care Management (Addendum)
Acknowledge consult for Home O2, however patient does not qualify. (96% RA walking) Referral faxed to [facility]

## 2019-10-11 NOTE — Discharge Summary (Signed)
[facility] Discharge Summary  Patient name: [name] Medical record number: [id number] Date of birth: 08-27-1956 Age: 64 y.o. Gender: female Date of Admission: 10/10/2019  Date of Discharge: 10/12/2019 Admitting Physician: Lind Covert, MD  Primary Care Provider: Tresa Garter, MD Consultants: None  Indication for Hospitalization: COVID positive with migraine  Discharge Diagnoses/Problem List:  Acute hypoxic respiratory failure secondary to COVID-19 infection, resolved Non-intractable migraine Acute kidney injury, resolved Chronic adrenal insufficiency on home hydrocortisone Hyperlipidemia Hypothyroidism GERD Depression High PVC burden Previous CVA  Disposition: Home  Discharge Condition: Stable  Discharge Exam:  General: Middle-aged female in no acute distress, resting comfortably HEENT: NCAT, MMM Cardiac: RRR no m/g/r Lungs: Clear bilaterally, no increased WOB satting 100% on room air Abdomen: soft Msk: Moves all extremities spontaneously  Neuro: Oriented, following commands appropriately.  Able to hold normal conversation.  EOMI, baseline lateral gaze on left eye.  Brief Hospital Course:  [name] is a 64 y.o. female presenting with intractable migraine and anorexia, found to have COVID-19 infection. PMH is significant for CVA, peptic ulcer, migraines, hypothyroidism, HLD, adrenal insufficiency, blindness in left eye, atrial myxoma, asthma.   AHRF and intractable migraine 2/2 COVID-19 Infection:  Pt presented with a 1 week h/o worsening migraines (despite home Fioricet), dry cough, and generalized weakness, found to be febrile to 100.8 and requiring 2L O2 to maintain saturations on arrival. Tested COVID positive, known exposure through her son. CXR without active cardiopulmonary disease.  Given severe migraine w/ fever, CT head obtained without acute intracranial abnormalities. Initial Covid labs: CRP 4.8,  D-dimer 1.45, ferritin 331, LDH 188, procalc <0.10, all of which trended down by discharge. Blood cultures negative x 48 hours. She was treated with 3 days of remdesivir and Decadron, which she will continue for a full 10-day course. Received a migraine cocktail with IVF, migraine resolved by 1/30.  Maximum oxygen requirement of 2L, never developed difficulty breathing. By discharge, she had remained stable on RA for >24 hours at rest and with ambulation.  Referred to COVID at home program for further monitoring.   Chronic adrenal Insufficiency:  Reports hydrocortisone 20 mg BID at home, however had not taken for several days prior to admission.  Initially hypotensive (90/60's) on admit, resolved with IVF. Received stress dose steroids while admitted (conveniently covered with Decadron 6 mg for COVID). She will continue this for an additional 8 days and then transition back to her home hydrocortisone.  Issues for Follow Up:  1. Please ensure she continues to remain stable from a respiratory standpoint on room air.  Sent home with COVID at home program that will continue monitoring patient.  2. Recommended a 21-day isolation due to requiring hospitalization for COVID, this will be completed on 10/31/2019.  3. Sent home with Decadron 6 mg, stress dose steroids/dosage for COVID, please ensure she restarts her home hydrocortisone as prescribed following completion of this course. 4. Consider follow up CBC. Hgb 11.1 at discharge, appears to have an intermittent baseline anemia.  No concern for bleeding during stay. Work-up as appropriate.  Significant Procedures: none  Significant Labs and Imaging:  Recent Labs  Lab 10/10/19 1325 10/11/19 0043 10/12/19 0332  WBC 5.1 7.4 3.6*  HGB 16.0* 13.2 11.1*  HCT 48.5* 42.5 34.2*  PLT 286 269 225   Recent Labs  Lab 10/10/19 1325 10/10/19 1325 10/10/19 1347 10/10/19 2155 10/11/19 0043 10/12/19 0332  NA 130*  --   --   --  133* 138  K 4.2   < >  --   --   4.7 4.3  CL 96*  --   --   --  102 107  CO2 19*  --   --   --  19* 23  GLUCOSE 75  --   --   --  119* 128*  BUN 18  --   --   --  14 16  CREATININE 1.20*  --   --   --  1.15* 0.87  CALCIUM 9.9  --   --   --  8.1* 8.7*  MG  --   --  1.7  --   --   --   ALKPHOS  --   --   --  56 49 37*  AST  --   --   --  44* 39 29  ALT  --   --   --  29 26 20   ALBUMIN  --   --   --  3.5 3.2* 2.9*   < > = values in this interval not displayed.   No results found.   Results/Tests Pending at Time of Discharge: Continue monitoring of blood cultures, no growth at 24 hours.  Discharge Medications:  Allergies as of 10/12/2019      Reactions   Aspirin Other (See Comments)   Can take baby aspirin (heart flutters with high doses)   Ibuprofen [ibuprofen] Nausea And Vomiting   Penicillins Nausea And Vomiting   Has patient had a PCN reaction causing immediate rash, facial/tongue/throat swelling, SOB or lightheadedness with hypotension: Yes Has patient had a PCN reaction causing severe rash involving mucus membranes or skin necrosis: No Has patient had a PCN reaction that required hospitalization No Has patient had a PCN reaction occurring within the last 10 years: No If all of the above answers are "NO", then may proceed with Cephalosporin use.      Medication List    TAKE these medications   butalbital-acetaminophen-caffeine 50-325-40 MG tablet Commonly known as: FIORICET Take 1 tablet by mouth every 6 (six) hours as needed for headache or migraine.   dexamethasone 6 MG tablet Commonly known as: DECADRON Take 1 tablet (6 mg total) by mouth daily for 8 days.   fenofibrate 48 MG tablet Commonly known as: TRICOR Take 1 tablet (48 mg total) by mouth daily.   hydrocortisone 20 MG tablet Commonly known as: CORTEF Take 0.5-1 tablets (10-20 mg total) by mouth 2 (two) times daily. Take 1 tablet every morning and 1/2 tablet every evening What changed:   how much to take  additional instructions    levothyroxine 75 MCG tablet Commonly known as: SYNTHROID Take 75 mcg by mouth daily. What changed: Another medication with the same name was removed. Continue taking this medication, and follow the directions you see here.   metoprolol tartrate 25 MG tablet Commonly known as: LOPRESSOR Take 1 tablet (25 mg total) by mouth daily.   omega-3 acid ethyl esters 1 g capsule Commonly known as: LOVAZA Take 1 capsule (1 g total) by mouth 2 (two) times daily.   omeprazole 20 MG capsule Commonly known as: PRILOSEC Take 1 capsule (20 mg total) by mouth daily.   rosuvastatin 10 MG tablet Commonly known as: CRESTOR Take 10 mg by mouth daily.   traZODone 50 MG tablet Commonly known as: DESYREL Take 0.5-1 tablets (25-50 mg total) by mouth at bedtime as needed for sleep. What changed: how much to take   venlafaxine XR  150 MG 24 hr capsule Commonly known as: EFFEXOR-XR Take 1 capsule (150 mg total) by mouth daily.       Discharge Instructions: Please refer to Patient Instructions section of EMR for full details.  Patient was counseled important signs and symptoms that should prompt return to medical care, changes in medications, dietary instructions, activity restrictions, and follow up appointments.   Follow-Up Appointments: Follow-up Information    Tresa Garter, MD. Schedule an appointment as soon as possible for a visit in 2 days.   Specialty: Internal Medicine Why: As needed Contact information: [place] [address] [phone]        [facility] Follow up.   Why: They will send a nurse to your house to check on you  Contact information: [place] [address] [phone]           Patriciaann Clan, DO 10/12/2019, 5:28 PM PGY-2, [place]

## 2019-10-11 NOTE — Progress Notes (Signed)
   10/11/19 0500  Vitals  BP (!) 80/43  MAP (mmHg) (!) 56  BP Location Right Arm  BP Method Automatic  Patient Position (if appropriate) Lying  Pulse Rate 90  Resp 15  Level of Consciousness  Level of Consciousness Alert  Oxygen Therapy  SpO2 97 %  O2 Device Nasal Cannula  O2 Flow Rate (L/min) 2 L/min  MEWS Score  MEWS Temp 0  MEWS Systolic 2  MEWS Pulse 0  MEWS RR 0  MEWS LOC 0  MEWS Score 2  MEWS Score Color Yellow   Patient alert and oriented x4. No complaints of CP, SOB, dizziness or light headed. Dr. Dorathy Kinsman aware of BP, manual BP taken with result of 90/60. New orders for fluid bolus. Will continue to monitor.

## 2019-10-11 NOTE — Progress Notes (Signed)
Family Medicine Teaching Service Daily Progress Note Intern Pager: [phone]  Patient name: [name] Medical record number: [id number] Date of birth: 09/14/1955 Age: 64 y.o. Gender: female  Primary Care Provider: Tresa Garter, MD Consultants: none Code Status: Full Code  Pt Overview and Major Events to Date:  1/29: admitted, COVID+, febrile, stress hydrocortisone 100mg , remdesivir (1/29- 10/14/19) started  1/30: decadron started (1/30- 10/20/19), 1 L of fluids and for low BP  Assessment and Plan: HARVI CULVER is a 64 y.o. female presenting with migraine and migraine. PMH is significant for CVA, peptic ulcer, migraines, hypothyroidism, HLD, adrenal insufficiency, blindness in left eye, atrial myxoma, asthma.   COVID-19 Infection  Patient admitted and febrile to 100.8. Overnight, fever subsided with temp stable at 98.6 this morning. Pt reports feeling mildly improved does report new chest pain in the center and right side of her chest that is reproducible with palpation. Respiratory stable as patient remained on 2Liters via [place] overnight. Patient states that her SOB is improved overnight, denies any chest pain with taking a deep breath. Labs changed overnight to show D-dimer 1.45>1.47(mildly increased), CRP increased to 4.8>5.5,HIV Non reactive.   -continue remdesivir (1/29-2/2) -start decadron 6mg  (1/30-2/8) -continue supplemental oxygen PRN, wean as tolerated  -patient will need to ambulate with pulse ox  -f/u blood cultures, pending  -continue IS  -continuous pulse ox  -Monitor vitals q2hours  -Prone 16 hours per day   Hypotension  Patient noted to be hypotensive overnight as low as 80/43 auto, automatic blood pressure 85 systolic while in the room for this morning's exam. -will order 1 additional liter of NS  -continue MIVFs at 131mL/hr  -Continue to monitor blood pressure with vitals -if hypotension persists, will consider CCM consult   Migraine HA  Migraine  pain improved this AM, patient rates it as a 7/10 and states that this is improved from when she first came in to the hospital.  Patient reports stable nausea with no episodes of emesis overnight. Neuro exam remains normal and patient is alert and oriented. Patient s/p 25 Benadryl, 10mg  compazine, stress dose steroid 100mg  hydrocortisone, and MIVFs in addition to fluid bolus in ED.  -continue fiorcet as needed -continue MIVFs at 125 mils per hour, photosensitivity to light has improved  AKI  Cr improved to 1.15 from 1.2 overnight with MIVF.  -continue to monitor -Patient will receive 1 additional liter bolus for low blood pressure this morning -We will monitor urine output, no recorded overnight  FEN/GI: heart healthy diet  PPx: Lovenox   Disposition: discharge pending improved HA, improved AKI and respiratory status  Subjective:  [name] reports she feels somewhat better from when she initially presented to the hospital.  Patient states that she still continues to have nausea but has not had any episodes of emesis overnight.  Patient states that her blood pressure normally runs low but is not aware of a range.  Patient reports right-sided and central chest pain that is reproducible with palpation.  She reports improved shortness of breath in improved migraine pain that is down to 7/10 in comparison to greater than 10 upon initial presentation.  Objective: Temp:  [98.6 F (37 C)-100.8 F (38.2 C)] 98.6 F (37 C) (01/30 0451) Pulse Rate:  [84-123] 84 (01/30 0636) Resp:  [12-21] 15 (01/30 0636) BP: (74-118)/(43-94) 95/63 (01/30 0651) SpO2:  [94 %-100 %] 100 % (01/30 0636) Weight:  [98.7 kg] 98.7 kg (01/30 0518)  Physical Exam: General: Elderly female lying in  bed, sleepy, easy to wake him by voice Cardiovascular: Regular rate and rhythm, no murmurs appreciated, bilateral radial pulses palpable Respiratory: Clear to auscultation bilaterally with no wheezing, no crackles, patient on  2 L via nasal cannula no retractions noted, patient resting comfortably Abdomen: Abdomen without tenderness to palpation, bowel sounds appreciated, soft Extremities: Moves all extremities with normal range of motion Neuro: Patient is alert and oriented to person, place, year, president and month.  Patient with 5 out of 5 strength and extraocular muscles intact bilaterally still with conjunctival injection in both eyes  Laboratory: Recent Labs  Lab 10/10/19 1325 10/11/19 0043  WBC 5.1 7.4  HGB 16.0* 13.2  HCT 48.5* 42.5  PLT 286 269   Recent Labs  Lab 10/10/19 1325 10/10/19 2155 10/11/19 0043  NA 130*  --  133*  K 4.2  --  4.7  CL 96*  --  102  CO2 19*  --  19*  BUN 18  --  14  CREATININE 1.20*  --  1.15*  CALCIUM 9.9  --  8.1*  PROT  --  7.1 6.2*  BILITOT  --  0.6 0.3  ALKPHOS  --  56 49  ALT  --  29 26  AST  --  44* 39  GLUCOSE 75  --  119*     Imaging/Diagnostic Tests: CT Head Wo Contrast  Result Date: 10/10/2019 CLINICAL DATA:  Migraine beginning this morning. EXAM: CT HEAD WITHOUT CONTRAST TECHNIQUE: Contiguous axial images were obtained from the base of the skull through the vertex without intravenous contrast. COMPARISON:  Jan 23, 2017 FINDINGS: Brain: No subdural, epidural, or subarachnoid hemorrhage. Cerebellum, brainstem, and basal cisterns are normal. Ventricles and sulci are unremarkable. No mass effect or midline shift. No acute cortical ischemia or infarct. Vascular: No hyperdense vessel or unexpected calcification. Skull: Normal. Negative for fracture or focal lesion. Sinuses/Orbits: Mucosal thickening is seen throughout the paranasal sinuses, particularly the ethmoid air cells, with no air-fluid levels identified. Mastoid air cells and middle ears are well aerated. Other: None. IMPRESSION: 1. No acute intracranial abnormalities. 2. Significant mucosal thickening throughout the paranasal sinuses as above with no air-fluid levels. Electronically Signed   By: Dorise Bullion III M.D   On: 10/10/2019 18:50   DG Chest Portable 1 View  Result Date: 10/10/2019 CLINICAL DATA:  Fever EXAM: PORTABLE CHEST 1 VIEW COMPARISON:  12/06/2018 FINDINGS: Post sternotomy changes. Abandoned pacer leads and numerous surgical clips in the upper abdomen. No focal airspace disease or pleural effusion. Heart size within normal limits. No pneumothorax. IMPRESSION: No active disease. Electronically Signed   By: Donavan Foil M.D.   On: 10/10/2019 19:51     Stark Klein, MD 10/11/2019, 7:37 AM PGY-1, [place] Intern pager: [phone], text pages welcome

## 2019-10-12 LAB — COMPREHENSIVE METABOLIC PANEL
ALT: 20 U/L (ref 0–44)
AST: 29 U/L (ref 15–41)
Albumin: 2.9 g/dL — ABNORMAL LOW (ref 3.5–5.0)
Alkaline Phosphatase: 37 U/L — ABNORMAL LOW (ref 38–126)
Anion gap: 8 (ref 5–15)
BUN: 16 mg/dL (ref 8–23)
CO2: 23 mmol/L (ref 22–32)
Calcium: 8.7 mg/dL — ABNORMAL LOW (ref 8.9–10.3)
Chloride: 107 mmol/L (ref 98–111)
Creatinine, Ser: 0.87 mg/dL (ref 0.44–1.00)
GFR calc Af Amer: >60 mL/min (ref >60)
GFR calc non Af Amer: >60 mL/min (ref >60)
Glucose, Bld: 128 mg/dL — ABNORMAL HIGH (ref 70–99)
Potassium: 4.3 mmol/L (ref 3.5–5.1)
Sodium: 138 mmol/L (ref 135–145)
Total Bilirubin: 0.5 mg/dL (ref 0.3–1.2)
Total Protein: 5.6 g/dL — ABNORMAL LOW (ref 6.5–8.1)

## 2019-10-12 LAB — CBC WITH DIFFERENTIAL/PLATELET
Abs Immature Granulocytes: 0.01 10*3/uL (ref 0.00–0.07)
Basophils Absolute: 0 10*3/uL (ref 0.0–0.1)
Basophils Relative: 0 %
Eosinophils Absolute: 0 10*3/uL (ref 0.0–0.5)
Eosinophils Relative: 0 %
HCT: 34.2 % — ABNORMAL LOW (ref 36.0–46.0)
Hemoglobin: 11.1 g/dL — ABNORMAL LOW (ref 12.0–15.0)
Immature Granulocytes: 0 %
Lymphocytes Relative: 42 %
Lymphs Abs: 1.5 10*3/uL (ref 0.7–4.0)
MCH: 29.6 pg (ref 26.0–34.0)
MCHC: 32.5 g/dL (ref 30.0–36.0)
MCV: 91.2 fL (ref 80.0–100.0)
Monocytes Absolute: 0.5 10*3/uL (ref 0.1–1.0)
Monocytes Relative: 14 %
Neutro Abs: 1.6 10*3/uL — ABNORMAL LOW (ref 1.7–7.7)
Neutrophils Relative %: 44 %
Platelets: 225 10*3/uL (ref 150–400)
RBC: 3.75 MIL/uL — ABNORMAL LOW (ref 3.87–5.11)
RDW: 13.3 % (ref 11.5–15.5)
WBC: 3.6 10*3/uL — ABNORMAL LOW (ref 4.0–10.5)
nRBC: 0 % (ref 0.0–0.2)

## 2019-10-12 LAB — C-REACTIVE PROTEIN: CRP: 4.2 mg/dL — ABNORMAL HIGH (ref <1.0)

## 2019-10-12 LAB — D-DIMER, QUANTITATIVE: D-Dimer, Quant: 0.66 ug/mL-FEU — ABNORMAL HIGH (ref 0.00–0.50)

## 2019-10-12 MED ORDER — DEXAMETHASONE 6 MG PO TABS: 6.0000 mg | ORAL_TABLET | Freq: Every day | ORAL | 0 refills | Status: AC

## 2019-10-12 NOTE — Evaluation (Signed)
Physical Therapy Evaluation Patient Details Name: [name] MRN: [id number] DOB: Jun 04, 1956 Today's Date: 10/12/2019   History of Present Illness  Pt is a 64 y.o. female presenting with migraines in the setting of COVID 19 infection. PMH is significant for CVA, peptic ulcer, migraines, hypothyroidism, HLD, adrenal insufficiency, blindness in left eye, atrial myxoma, asthma.   Clinical Impression  PTA pt living with husband in camper with 3 steps to enter and bed and bath on one level. Pt reports ambulation with and without RW depending on how she felt and independence with bathing, dressing and iADLs. Pt currently limited in safe mobility by generalized weakness. Pt is supervision for bed mobility, transfers and ambulation of 300 feet without AD. Pt will not require any equipment or PT services at discharge, however PT will continue to follow acutely to improve strength and endurance. Pt hopeful for d/c home today.     Follow Up Recommendations No PT follow up;Supervision for mobility/OOB    Equipment Recommendations  None recommended by PT    Recommendations for Other Services       Precautions / Restrictions Precautions Precautions: Other (comment) Precaution Comments: monitor sats Restrictions Weight Bearing Restrictions: No      Mobility  Bed Mobility Overal bed mobility: Needs Assistance Bed Mobility: Supine to Sit     Supine to sit: Supervision     General bed mobility comments: supervision for safety  Transfers Overall transfer level: Needs assistance Equipment used: None Transfers: Sit to/from Stand Sit to Stand: Supervision         General transfer comment: for lines  Ambulation/Gait Ambulation/Gait assistance: Min guard;Supervision Gait Distance (Feet): 300 Feet Assistive device: None Gait Pattern/deviations: Step-through pattern;Shuffle;Decreased step length - right;Decreased step length - left;Wide base of support Gait velocity: slowed Gait  velocity interpretation: 1.31 - 2.62 ft/sec, indicative of limited community ambulator General Gait Details: min guard progressing to supervision with waddling gait, in high guard position, no overt LoB         Balance Overall balance assessment: Mild deficits observed, not formally tested                                           Pertinent Vitals/Pain Pain Assessment: No/denies pain    Home Living Family/patient expects to be discharged to:: Private residence(camper, ) Living Arrangements: Spouse/significant other Available Help at Discharge: Family;Available 24 hours/day Type of Home: Other(Comment) Home Access: (camper )     Home Layout: One level Home Equipment: Tub bench;Walker - 2 wheels;Cane - single point      Prior Function Level of Independence: Independent         Comments: use RW when feeling weak         Extremity/Trunk Assessment   Upper Extremity Assessment Upper Extremity Assessment: Defer to OT evaluation    Lower Extremity Assessment Lower Extremity Assessment: Overall WFL for tasks assessed       Communication   Communication: No difficulties  Cognition Arousal/Alertness: Awake/alert Behavior During Therapy: WFL for tasks assessed/performed Overall Cognitive Status: Within Functional Limits for tasks assessed                                 General Comments: very pleasant      General Comments General comments (skin integrity, edema, etc.): pt completing session on  RA; SpO2 with hallway level mobility initially decreasing to approx 86-87%, with continued activity SpO2 returned to >90% and maintaining >90% for remainder of session        Assessment/Plan    PT Assessment Patient needs continued PT services  PT Problem List Decreased strength;Decreased activity tolerance;Decreased mobility;Cardiopulmonary status limiting activity       PT Treatment Interventions Gait training;Stair  training;Functional mobility training;Therapeutic activities;Therapeutic exercise;Balance training;Cognitive remediation;Patient/family education    PT Goals (Current goals can be found in the Care Plan section)  Acute Rehab PT Goals Patient Stated Goal: home when able PT Goal Formulation: With patient Time For Goal Achievement: 10/26/19 Potential to Achieve Goals: Good    Frequency Min 3X/week    AM-PAC PT "6 Clicks" Mobility  Outcome Measure Help needed turning from your back to your side while in a flat bed without using bedrails?: None Help needed moving from lying on your back to sitting on the side of a flat bed without using bedrails?: A Little Help needed moving to and from a bed to a chair (including a wheelchair)?: None Help needed standing up from a chair using your arms (e.g., wheelchair or bedside chair)?: None Help needed to walk in hospital room?: None Help needed climbing 3-5 steps with a railing? : A Little 6 Click Score: 22    End of Session Equipment Utilized During Treatment: Gait belt Activity Tolerance: Patient tolerated treatment well Patient left: in chair;with call bell/phone within reach Nurse Communication: Mobility status PT Visit Diagnosis: Other abnormalities of gait and mobility (R26.89);Muscle weakness (generalized) (M62.81)    Time: [id number] PT Time Calculation (min) (ACUTE ONLY): 32 min   Charges:   PT Evaluation $PT Eval Moderate Complexity: 1 Mod          Kodey Xue B. Migdalia Dk PT, DPT Acute Rehabilitation Services Pager [phone] Office [phone]   [place] 10/12/2019, 2:09 PM

## 2019-10-12 NOTE — Progress Notes (Signed)
[name] to be D/C'd Home per MD order.  Discussed with the patient and all questions fully answered.  VSS, Skin clean, dry and intact without evidence of skin break down, no evidence of skin tears noted. IV catheter discontinued intact. Site without signs and symptoms of complications. Dressing and pressure applied.  An After Visit Summary was printed and given to the patient. Patient received prescription.  D/c education completed with patient including follow up instructions, medication list, d/c activities limitations if indicated, with other d/c instructions as indicated by MD - patient able to verbalize understanding, all questions fully answered.   Patient instructed to return to ED, call 911, or call MD for any changes in condition.   Patient escorted via [place], and D/C home via private auto.  Jeanella Craze 10/12/2019 5:18 PM

## 2019-10-12 NOTE — Progress Notes (Addendum)
Family Medicine Teaching Service Daily Progress Note Intern Pager: [phone]  Patient name: [name] Medical record number: [id number] Date of birth: November 16, 1955 Age: 64 y.o. Gender: female  Primary Care Provider: Tresa Garter, MD Consultants: none Code Status: Full Code  Pt Overview and Major Events to Date:  1/29: admitted, COVID+, febrile, stress hydrocortisone 100mg , remdesivir (1/29- 10/14/19) started  1/30: decadron started (1/30- 10/20/19), 1 L of fluids and for low BP  Assessment and Plan: [name] is a 64 y.o. female presenting with migraines in the setting of COVID 19 infection. PMH is significant for CVA, peptic ulcer, migraines, hypothyroidism, HLD, adrenal insufficiency, blindness in left eye, atrial myxoma, asthma.   Acute hypoxic RF 2/2 COVID-19 Infection: Resolved. Remains on RA since 0800 on 1/30.  Inflammatory markers trending down. -Continue remdesivir (1/29-2/2) -Continue Decadron 6 mg (1/30-2/8) -Oxygen as needed -Ambulate with pulse ox prior to DC -Monitor BCx, NG at 24 hours -Prone as often as possible -Vitals per routine, pulse ox  Non-intractable migraine, in setting of Covid: Resolved. Neurologically intact. -Continue home Fioricet as needed -Encourage oral hydration  AKI: Resolved. CR 0.87 from 1.1 5 yesterday, improved with IVF.  -Monitor BMP  Adrenal Insufficiency  H/o Cushing's syndrome: Chronic, stable.  Receiving stress dose steroids through Decadron dosing for COVID.  Takes hydrocortisone 20 mg BID at home. -Continue Decadron as stated above -Monitor vitals                             Hyperlipidemia: Chronic, stable. -Continue home rosuvastatin 10 mg and fenofibrate 40 mg   Hypothyroidism: Chronic, stable. -Continue home levothyroxine 75 mg  GERD: Chronic, stable. -Continue home Prilosec, substituted with formulary Protonix  Depression: Chronic, stable. Will be following up with [facility]. -Continue home  medication Effexor 150 mg  High PVC burden: Chronic, stable. Takes Lopressor 25 mg at home. -Continue to hold due to softer blood pressures, may consider starting a lower dose if stable  Previous CVA: Chronic, stable. -Continue home atorvastatin  FEN/GI: heart healthy diet  PPx: Lovenox   Disposition: Likely discharge home today  Subjective:  No acute events overnight.  Has been stable on room air since yesterday morning.  Says her migraine has resolved, went away early last night.  She is eating and drinking well.  Feels almost back to her baseline, a little generally weak.  Denies any difficulty breathing, chest pain.  Is anxious to get home back to her husband.  Objective: Temp:  [97.5 F (36.4 C)-98.6 F (37 C)] 97.5 F (36.4 C) (01/31 0557) Pulse Rate:  [74-96] 74 (01/31 0557) Resp:  [11-30] 15 (01/31 0623) BP: (91-128)/(57-89) 111/72 (01/31 0623) SpO2:  [96 %-100 %] 100 % (01/31 0623) Weight:  [96.1 kg] 96.1 kg (01/31 0438)  Physical Exam: General: Middle-aged female in no acute distress, resting comfortably HEENT: NCAT, MMM Cardiac: RRR no m/g/r Lungs: Clear bilaterally, no increased WOB satting 100% on room air Abdomen: soft Msk: Moves all extremities spontaneously  Neuro: Oriented, following commands appropriately.  Able to hold normal conversation.  EOMI, baseline lateral gaze on left eye.   Laboratory: Recent Labs  Lab 10/10/19 1325 10/11/19 0043 10/12/19 0332  WBC 5.1 7.4 3.6*  HGB 16.0* 13.2 11.1*  HCT 48.5* 42.5 34.2*  PLT 286 269 225   Recent Labs  Lab 10/10/19 1325 10/10/19 2155 10/11/19 0043 10/12/19 0332  NA 130*  --  133* 138  K 4.2  --  4.7 4.3  CL 96*  --  102 107  CO2 19*  --  19* 23  BUN 18  --  14 16  CREATININE 1.20*  --  1.15* 0.87  CALCIUM 9.9  --  8.1* 8.7*  PROT  --  7.1 6.2* 5.6*  BILITOT  --  0.6 0.3 0.5  ALKPHOS  --  56 49 37*  ALT  --  29 26 20   AST  --  44* 39 29  GLUCOSE 75  --  119* 128*     Imaging/Diagnostic  Tests: No results found.   Patriciaann Clan, DO 10/12/2019, 7:49 AM PGY-2, [place] Intern pager: [phone], text pages welcome

## 2019-10-12 NOTE — Plan of Care (Signed)
  Problem: Education: Goal: Knowledge of risk factors and measures for prevention of condition will improve Outcome: Progressing   Problem: Coping: Goal: Psychosocial and spiritual needs will be supported Outcome: Progressing   Problem: Respiratory: Goal: Will maintain a patent airway Outcome: Progressing Goal: Complications related to the disease process, condition or treatment will be avoided or minimized Outcome: Progressing   

## 2019-10-12 NOTE — Evaluation (Addendum)
Occupational Therapy Evaluation Patient Details Name: [name] MRN: [id number] DOB: [birth year] Today's Date: 10/12/2019    History of Present Illness Pt is a 64 y.o. female presenting with migraines in the setting of COVID 19 infection. PMH is significant for CVA, peptic ulcer, migraines, hypothyroidism, HLD, adrenal insufficiency, blindness in left eye, atrial myxoma, asthma.    Clinical Impression   This 64 y/o female presents with the above. PTA pt reports mod independent with ADL and mobility, intermittently using RW. Pt very pleasant and willing to work with therapies this session; tolerating toileting and standing grooming ADL as well as room and hallway level mobility without AD. Overall pt completing ADL and functional mobility/transfers at supervision level. Pt completing session on RA, lowest SpO2 noted intermittently 86/87% with activity, with continued activity SpO2 returning to >90% and maintaining >90% for remainder of session. Pt will benefit from continued acute OT services to maximize her overall endurance, safety and independence with ADL and mobility. Do not anticipate pt will require follow up OT services.     Follow Up Recommendations  No OT follow up;Supervision - Intermittent    Equipment Recommendations  None recommended by OT           Precautions / Restrictions Precautions Precautions: Other (comment) Precaution Comments: monitor sats Restrictions Weight Bearing Restrictions: No      Mobility Bed Mobility               General bed mobility comments: received OOB on BSC with PT  Transfers Overall transfer level: Needs assistance Equipment used: None Transfers: Sit to/from Stand Sit to Stand: Supervision         General transfer comment: for lines    Balance Overall balance assessment: Mild deficits observed, not formally tested                                         ADL either performed or assessed with  clinical judgement   ADL Overall ADL's : Needs assistance/impaired Eating/Feeding: Independent;Sitting   Grooming: Wash/dry hands;Supervision/safety;Standing   Upper Body Bathing: Set up;Supervision/ safety;Sitting   Lower Body Bathing: Supervison/ safety;Sit to/from stand   Upper Body Dressing : Set up;Supervision/safety;Sitting   Lower Body Dressing: Supervision/safety;Sit to/from stand   Toilet Transfer: Supervision/safety;Ambulation Toilet Transfer Details (indicate cue type and reason): seated on BSC upon arrival with PT, completed room and hallway level mobility tasks at supervision level most toileting  Toileting- Clothing Manipulation and Hygiene: Supervision/safety;Sitting/lateral lean;Sit to/from stand Toileting - Clothing Manipulation Details (indicate cue type and reason): pt performing anterior and posterior pericare with setup/supervision in standing     Functional mobility during ADLs: Supervision/safety General ADL Comments: pt grossly with 2/4 DOE with activity, overall tolerating session well      Vision         Perception     Praxis      Pertinent Vitals/Pain Pain Assessment: No/denies pain     Hand Dominance     Extremity/Trunk Assessment Upper Extremity Assessment Upper Extremity Assessment: Overall WFL for tasks assessed   Lower Extremity Assessment Lower Extremity Assessment: Defer to PT evaluation       Communication Communication Communication: No difficulties   Cognition Arousal/Alertness: Awake/alert Behavior During Therapy: WFL for tasks assessed/performed Overall Cognitive Status: Within Functional Limits for tasks assessed  General Comments: very pleasant   General Comments  pt completing session on RA; SpO2 with hallway level mobility initially decreasing to approx 86-87%, with continued activity SpO2 returned to >90% and maintaining >90% for remainder of session    Exercises      Shoulder Instructions      Home Living Family/patient expects to be discharged to:: Private residence(camper, ) Living Arrangements: Spouse/significant other Available Help at Discharge: Family;Available 24 hours/day Type of Home: Other(Comment) Home Access: (camper )     Home Layout: One level     Bathroom Shower/Tub: Teacher, early years/pre: Standard Bathroom Accessibility: Yes   Home Equipment: Tub bench;Walker - 2 wheels;Cane - single point          Prior Functioning/Environment Level of Independence: Independent        Comments: use RW when feeling weak         OT Problem List: Decreased strength;Decreased activity tolerance;Impaired balance (sitting and/or standing);Cardiopulmonary status limiting activity;Obesity      OT Treatment/Interventions: Self-care/ADL training;Therapeutic exercise;Energy conservation;DME and/or AE instruction;Therapeutic activities;Patient/family education;Balance training    OT Goals(Current goals can be found in the care plan section) Acute Rehab OT Goals Patient Stated Goal: home when able OT Goal Formulation: With patient Time For Goal Achievement: 10/26/19 Potential to Achieve Goals: Good  OT Frequency: Min 2X/week   Barriers to D/C:            Co-evaluation PT/OT/SLP Co-Evaluation/Treatment: (overlap with PT, to assist with lines for hallway mobility )            AM-PAC OT "6 Clicks" Daily Activity     Outcome Measure Help from another person eating meals?: None Help from another person taking care of personal grooming?: None Help from another person toileting, which includes using toliet, bedpan, or urinal?: A Little Help from another person bathing (including washing, rinsing, drying)?: A Little Help from another person to put on and taking off regular upper body clothing?: None Help from another person to put on and taking off regular lower body clothing?: A Little 6 Click Score: 21   End of  Session Equipment Utilized During Treatment: Gait belt Nurse Communication: Mobility status  Activity Tolerance: Patient tolerated treatment well Patient left: in chair;with call bell/phone within reach  OT Visit Diagnosis: Muscle weakness (generalized) (M62.81);Other (comment)(decreased activity tolerance)                Time: [id number] OT Time Calculation (min): 23 min Charges:  OT General Charges $OT Visit: 1 Visit OT Evaluation $OT Eval Moderate Complexity: 1 Mod  Lou Cal, OT [employer] Pager [phone] Office [phone]   Raymondo Band 10/12/2019, 1:41 PM

## 2019-10-14 ENCOUNTER — Telehealth: Payer: Self-pay | Admitting: Nurse Practitioner

## 2019-10-14 ENCOUNTER — Telehealth: Payer: Self-pay | Admitting: Internal Medicine

## 2019-10-14 ENCOUNTER — Other Ambulatory Visit (HOSPITAL_COMMUNITY): Payer: Self-pay | Admitting: Nurse Practitioner

## 2019-10-14 ENCOUNTER — Telehealth (HOSPITAL_COMMUNITY): Payer: Self-pay | Admitting: Nurse Practitioner

## 2019-10-14 DIAGNOSIS — U071 COVID-19: Secondary | ICD-10-CM

## 2019-10-14 NOTE — Telephone Encounter (Addendum)
Received message from patient on infusion hotline requesting appointment for MAB treatment. I returned her call to discuss. Left message  Ms. Defelice had initially gone to er with symptoms of migraine and was admitted. She was incidentally found to be symptomatic at that time. She says she developed symptoms on 10/09/2019. She received remdesivir during hospitalization but medication was not continued outpatient and hospitalist wanted her to received MAB treatment if she qualified. Chart reviewed and qualifying criteria discussed with patient who does qualify and agree to infusion. See Orders encounter.

## 2019-10-14 NOTE — Telephone Encounter (Signed)
Called, spoke with patient. She is asking for an infusion appointment for covid. I explained that we do not do these here and give her the infusion center phone number.

## 2019-10-14 NOTE — Telephone Encounter (Signed)
Erroneous encounter

## 2019-10-14 NOTE — Progress Notes (Signed)
I connected by phone with [name] on 10/14/2019 at 11:03 AM to discuss the potential use of an new treatment for mild to moderate COVID-19 viral infection in non-hospitalized patients.  This patient is a 64 y.o. female that meets the FDA criteria for Emergency Use Authorization of bamlanivimab or casirivimab\imdevimab.  Has a (+) direct SARS-CoV-2 viral test result  Has mild or moderate COVID-19   Is ? 64 years of age and weighs ? 40 kg  Is NOT hospitalized due to COVID-19  Is NOT requiring oxygen therapy or requiring an increase in baseline oxygen flow rate due to COVID-19  Is within 10 days of symptom onset  Has at least one of the high risk factor(s) for progression to severe COVID-19 and/or hospitalization as defined in EUA.  Specific high risk criteria : BMI >/= 35, > age 35 with hx of hypertension, chronic use of immunosuppressive medication  I have spoken and communicated the following to the patient or parent/caregiver:  1. FDA has authorized the emergency use of bamlanivimab and casirivimab\imdevimab for the treatment of mild to moderate COVID-19 in adults and pediatric patients with positive results of direct SARS-CoV-2 viral testing who are 26 years of age and older weighing at least 40 kg, and who are at high risk for progressing to severe COVID-19 and/or hospitalization.  2. The significant known and potential risks and benefits of bamlanivimab and casirivimab\imdevimab, and the extent to which such potential risks and benefits are unknown.  3. Information on available alternative treatments and the risks and benefits of those alternatives, including clinical trials.  4. Patients treated with bamlanivimab and casirivimab\imdevimab should continue to self-isolate and use infection control measures (e.g., wear mask, isolate, social distance, avoid sharing personal items, clean and disinfect "high touch" surfaces, and frequent handwashing) according to CDC guidelines.    5. The patient or parent/caregiver has the option to accept or refuse bamlanivimab or casirivimab\imdevimab .  After reviewing this information with the patient, The patient agreed to proceed with receiving the bamlanimivab infusion and will be provided a copy of the Fact sheet prior to receiving the infusion.[name], [place], AGNP-C [phone] ([place])

## 2019-10-15 ENCOUNTER — Ambulatory Visit (HOSPITAL_COMMUNITY)
Admission: RE | Admit: 2019-10-15 | Discharge: 2019-10-15 | Disposition: A | Discharge status: Home / Self Care | Payer: Medicaid Other | Source: Ambulatory Visit | Attending: Pulmonary Disease | Admitting: Pulmonary Disease

## 2019-10-15 ENCOUNTER — Other Ambulatory Visit: Payer: Self-pay | Admitting: Physician Assistant

## 2019-10-15 DIAGNOSIS — U071 COVID-19: Secondary | ICD-10-CM | POA: Diagnosis not present

## 2019-10-15 DIAGNOSIS — Z23 Encounter for immunization: Secondary | ICD-10-CM | POA: Insufficient documentation

## 2019-10-15 LAB — CULTURE, BLOOD (ROUTINE X 2): Culture: NO GROWTH

## 2019-10-15 MED ORDER — ALBUTEROL SULFATE HFA 108 (90 BASE) MCG/ACT IN AERS: 2.0000 | INHALATION_SPRAY | Freq: Once | RESPIRATORY_TRACT | Status: DC | PRN

## 2019-10-15 MED ORDER — SODIUM CHLORIDE 0.9 % IV SOLN: INTRAVENOUS | Status: DC | PRN

## 2019-10-15 MED ORDER — FAMOTIDINE IN NACL 20-0.9 MG/50ML-% IV SOLN: 20.0000 mg | Freq: Once | INTRAVENOUS | Status: DC | PRN

## 2019-10-15 MED ORDER — DIPHENHYDRAMINE HCL 50 MG/ML IJ SOLN: 50.0000 mg | Freq: Once | INTRAMUSCULAR | Status: DC | PRN

## 2019-10-15 MED ORDER — METHYLPREDNISOLONE SODIUM SUCC 125 MG IJ SOLR: 125.0000 mg | Freq: Once | INTRAMUSCULAR | Status: DC | PRN

## 2019-10-15 MED ORDER — EPINEPHRINE 0.3 MG/0.3ML IJ SOAJ: 0.3000 mg | Freq: Once | INTRAMUSCULAR | Status: DC | PRN

## 2019-10-15 MED ORDER — SODIUM CHLORIDE 0.9 % IV SOLN
700.0000 mg | Freq: Once | INTRAVENOUS | Status: AC
  Administered 2019-10-15: 700 mg via INTRAVENOUS
  Filled 2019-10-15: qty 20

## 2019-10-15 NOTE — Discharge Instructions (Signed)

## 2019-10-15 NOTE — Progress Notes (Signed)
  Diagnosis: COVID-19  Physician:  Procedure: Covid Infusion Clinic Med: bamlanivimab infusion - Provided patient with bamlanimivab fact sheet for patients, parents and caregivers prior to infusion.  Complications: No immediate complications noted.  Discharge: Discharged home   Butler-Faison,Brinley Treanor D 10/15/2019

## 2019-10-16 LAB — CULTURE, BLOOD (ROUTINE X 2): Culture: NO GROWTH

## 2019-10-20 ENCOUNTER — Encounter: Payer: Self-pay | Admitting: Nurse Practitioner

## 2019-10-20 ENCOUNTER — Ambulatory Visit (INDEPENDENT_AMBULATORY_CARE_PROVIDER_SITE_OTHER): Payer: Medicaid Other | Admitting: Nurse Practitioner

## 2019-10-20 ENCOUNTER — Other Ambulatory Visit: Payer: Self-pay

## 2019-10-20 DIAGNOSIS — Z09 Encounter for follow-up examination after completed treatment for conditions other than malignant neoplasm: Secondary | ICD-10-CM

## 2019-10-20 NOTE — Patient Instructions (Signed)
COVID-19 COVID-19 is a respiratory infection that is caused by a virus called severe acute respiratory syndrome coronavirus 2 (SARS-CoV-2). The disease is also known as coronavirus disease or novel coronavirus. In some people, the virus may not cause any symptoms. In others, it may cause a serious infection. The infection can get worse quickly and can lead to complications, such as:  Pneumonia, or infection of the lungs.  Acute respiratory distress syndrome or ARDS. This is a condition in which fluid build-up in the lungs prevents the lungs from filling with air and passing oxygen into the blood.  Acute respiratory failure. This is a condition in which there is not enough oxygen passing from the lungs to the body or when carbon dioxide is not passing from the lungs out of the body.  Sepsis or septic shock. This is a serious bodily reaction to an infection.  Blood clotting problems.  Secondary infections due to bacteria or fungus.  Organ failure. This is when your body's organs stop working. The virus that causes COVID-19 is contagious. This means that it can spread from person to person through droplets from coughs and sneezes (respiratory secretions). What are the causes? This illness is caused by a virus. You may catch the virus by:  Breathing in droplets from an infected person. Droplets can be spread by a person breathing, speaking, singing, coughing, or sneezing.  Touching something, like a table or a doorknob, that was exposed to the virus (contaminated) and then touching your mouth, nose, or eyes. What increases the risk? Risk for infection You are more likely to be infected with this virus if you:  Are within 6 feet (2 meters) of a person with COVID-19.  Provide care for or live with a person who is infected with COVID-19.  Spend time in crowded indoor spaces or live in shared housing. Risk for serious illness You are more likely to become seriously ill from the virus if you:   Are 50 years of age or older. The higher your age, the more you are at risk for serious illness.  Live in a nursing home or long-term care facility.  Have cancer.  Have a long-term (chronic) disease such as: ? Chronic lung disease, including chronic obstructive pulmonary disease or asthma. ? A long-term disease that lowers your body's ability to fight infection (immunocompromised). ? Heart disease, including heart failure, a condition in which the arteries that lead to the heart become narrow or blocked (coronary artery disease), a disease which makes the heart muscle thick, weak, or stiff (cardiomyopathy). ? Diabetes. ? Chronic kidney disease. ? Sickle cell disease, a condition in which red blood cells have an abnormal "sickle" shape. ? Liver disease.  Are obese. What are the signs or symptoms? Symptoms of this condition can range from mild to severe. Symptoms may appear any time from 2 to 14 days after being exposed to the virus. They include:  A fever or chills.  A cough.  Difficulty breathing.  Headaches, body aches, or muscle aches.  Runny or stuffy (congested) nose.  A sore throat.  New loss of taste or smell. Some people may also have stomach problems, such as nausea, vomiting, or diarrhea. Other people may not have any symptoms of COVID-19. How is this diagnosed? This condition may be diagnosed based on:  Your signs and symptoms, especially if: ? You live in an area with a COVID-19 outbreak. ? You recently traveled to or from an area where the virus is common. ? You   provide care for or live with a person who was diagnosed with COVID-19. ? You were exposed to a person who was diagnosed with COVID-19.  A physical exam.  Lab tests, which may include: ? Taking a sample of fluid from the back of your nose and throat (nasopharyngeal fluid), your nose, or your throat using a swab. ? A sample of mucus from your lungs (sputum). ? Blood tests.  Imaging tests, which  may include, X-rays, CT scan, or ultrasound. How is this treated? At present, there is no medicine to treat COVID-19. Medicines that treat other diseases are being used on a trial basis to see if they are effective against COVID-19. Your health care provider will talk with you about ways to treat your symptoms. For most people, the infection is mild and can be managed at home with rest, fluids, and over-the-counter medicines. Treatment for a serious infection usually takes places in a hospital intensive care unit (ICU). It may include one or more of the following treatments. These treatments are given until your symptoms improve.  Receiving fluids and medicines through an IV.  Supplemental oxygen. Extra oxygen is given through a tube in the nose, a face mask, or a hood.  Positioning you to lie on your stomach (prone position). This makes it easier for oxygen to get into the lungs.  Continuous positive airway pressure (CPAP) or bi-level positive airway pressure (BPAP) machine. This treatment uses mild air pressure to keep the airways open. A tube that is connected to a motor delivers oxygen to the body.  Ventilator. This treatment moves air into and out of the lungs by using a tube that is placed in your windpipe.  Tracheostomy. This is a procedure to create a hole in the neck so that a breathing tube can be inserted.  Extracorporeal membrane oxygenation (ECMO). This procedure gives the lungs a chance to recover by taking over the functions of the heart and lungs. It supplies oxygen to the body and removes carbon dioxide. Follow these instructions at home: Lifestyle  If you are sick, stay home except to get medical care. Your health care provider will tell you how long to stay home. Call your health care provider before you go for medical care.  Rest at home as told by your health care provider.  Do not use any products that contain nicotine or tobacco, such as cigarettes, e-cigarettes, and  chewing tobacco. If you need help quitting, ask your health care provider.  Return to your normal activities as told by your health care provider. Ask your health care provider what activities are safe for you. General instructions  Take over-the-counter and prescription medicines only as told by your health care provider.  Drink enough fluid to keep your urine pale yellow.  Keep all follow-up visits as told by your health care provider. This is important. How is this prevented?  There is no vaccine to help prevent COVID-19 infection. However, there are steps you can take to protect yourself and others from this virus. To protect yourself:   Do not travel to areas where COVID-19 is a risk. The areas where COVID-19 is reported change often. To identify high-risk areas and travel restrictions, check the CDC travel website: wwwnc.cdc.gov/travel/notices  If you live in, or must travel to, an area where COVID-19 is a risk, take precautions to avoid infection. ? Stay away from people who are sick. ? Wash your hands often with soap and water for 20 seconds. If soap and water   are not available, use an alcohol-based hand sanitizer. ? Avoid touching your mouth, face, eyes, or nose. ? Avoid going out in public, follow guidance from your state and local health authorities. ? If you must go out in public, wear a cloth face covering or face mask. Make sure your mask covers your nose and mouth. ? Avoid crowded indoor spaces. Stay at least 6 feet (2 meters) away from others. ? Disinfect objects and surfaces that are frequently touched every day. This may include:  Counters and tables.  Doorknobs and light switches.  Sinks and faucets.  Electronics, such as phones, remote controls, keyboards, computers, and tablets. To protect others: If you have symptoms of COVID-19, take steps to prevent the virus from spreading to others.  If you think you have a COVID-19 infection, contact your health care  provider right away. Tell your health care team that you think you may have a COVID-19 infection.  Stay home. Leave your house only to seek medical care. Do not use public transport.  Do not travel while you are sick.  Wash your hands often with soap and water for 20 seconds. If soap and water are not available, use alcohol-based hand sanitizer.  Stay away from other members of your household. Let healthy household members care for children and pets, if possible. If you have to care for children or pets, wash your hands often and wear a mask. If possible, stay in your own room, separate from others. Use a different bathroom.  Make sure that all people in your household wash their hands well and often.  Cough or sneeze into a tissue or your sleeve or elbow. Do not cough or sneeze into your hand or into the air.  Wear a cloth face covering or face mask. Make sure your mask covers your nose and mouth. Where to find more information  Centers for Disease Control and Prevention: www.cdc.gov/coronavirus/2019-ncov/index.html  World Health Organization: www.who.int/health-topics/coronavirus Contact a health care provider if:  You live in or have traveled to an area where COVID-19 is a risk and you have symptoms of the infection.  You have had contact with someone who has COVID-19 and you have symptoms of the infection. Get help right away if:  You have trouble breathing.  You have pain or pressure in your chest.  You have confusion.  You have bluish lips and fingernails.  You have difficulty waking from sleep.  You have symptoms that get worse. These symptoms may represent a serious problem that is an emergency. Do not wait to see if the symptoms will go away. Get medical help right away. Call your local emergency services (911 in the [place] Do not drive yourself to the hospital. Let the emergency medical personnel know if you think you have COVID-19. Summary  COVID-19 is a  respiratory infection that is caused by a virus. It is also known as coronavirus disease or novel coronavirus. It can cause serious infections, such as pneumonia, acute respiratory distress syndrome, acute respiratory failure, or sepsis.  The virus that causes COVID-19 is contagious. This means that it can spread from person to person through droplets from breathing, speaking, singing, coughing, or sneezing.  You are more likely to develop a serious illness if you are 50 years of age or older, have a weak immune system, live in a nursing home, or have chronic disease.  There is no medicine to treat COVID-19. Your health care provider will talk with you about ways to treat your symptoms.    Take steps to protect yourself and others from infection. Wash your hands often and disinfect objects and surfaces that are frequently touched every day. Stay away from people who are sick and wear a mask if you are sick. This information is not intended to replace advice given to you by your health care provider. Make sure you discuss any questions you have with your health care provider. Document Revised: 06/27/2019 Document Reviewed: 10/03/2018 Elsevier Patient Education  2020 [employer]  

## 2019-10-20 NOTE — Progress Notes (Signed)
Virtual Visit via Telephone Note  I connected with [name] on 10/20/19 at  1:40 PM EST by telephone and verified that I am speaking with the correct person using two identifiers.   I discussed the limitations, risks, security and privacy concerns of performing an evaluation and management service by telephone and the availability of in person appointments. I also discussed with the patient that there may be a patient responsible charge related to this service. The patient expressed understanding and agreed to proceed.   History of Present Illness: She is post coronavirus.  She admits that she is doing well.  She states that her symptoms came on all of a sudden and started with a headache.  She was also experiencing tooth, neck hip and pelvic pain.  She currently Denies fever, headache, dizziness, visual changes, shortness of breath, dyspnea on exertion, chest pain, nausea, vomiting or any edema.   Observations/Objective:  Patient able to carry on full conversation without any shortness of breath  Assessment and Plan:  Post coronavirus   Follow Up Instructions: 4 weeks   I discussed the assessment and treatment plan with the patient. The patient was provided an opportunity to ask questions and all were answered. The patient agreed with the plan and demonstrated an understanding of the instructions.   The patient was advised to call back or seek an in-person evaluation if the symptoms worsen or if the condition fails to improve as anticipated.  I provided 11:27 minutes of non-face-to-face time during this encounter.   Vevelyn Francois, NP

## 2019-11-04 DIAGNOSIS — H5201 Hypermetropia, right eye: Secondary | ICD-10-CM | POA: Diagnosis not present

## 2019-11-04 DIAGNOSIS — H524 Presbyopia: Secondary | ICD-10-CM | POA: Diagnosis not present

## 2019-11-04 DIAGNOSIS — H52221 Regular astigmatism, right eye: Secondary | ICD-10-CM | POA: Diagnosis not present

## 2019-11-14 ENCOUNTER — Telehealth: Payer: Self-pay | Admitting: Nurse Practitioner

## 2019-11-14 NOTE — Telephone Encounter (Signed)
Pt was called and reminded of there appointment 

## 2019-11-17 ENCOUNTER — Ambulatory Visit: Payer: Medicaid Other | Admitting: Nurse Practitioner

## 2019-11-24 DIAGNOSIS — H5213 Myopia, bilateral: Secondary | ICD-10-CM | POA: Diagnosis not present

## 2019-12-06 ENCOUNTER — Other Ambulatory Visit: Payer: Self-pay | Admitting: Family Medicine

## 2019-12-06 DIAGNOSIS — G8929 Other chronic pain: Secondary | ICD-10-CM

## 2019-12-08 ENCOUNTER — Other Ambulatory Visit: Payer: Self-pay | Admitting: Nurse Practitioner

## 2019-12-08 ENCOUNTER — Telehealth: Payer: Self-pay

## 2019-12-08 DIAGNOSIS — G8929 Other chronic pain: Secondary | ICD-10-CM

## 2019-12-08 NOTE — Telephone Encounter (Signed)
Refill request for Fioricet. Please advise.

## 2019-12-11 DIAGNOSIS — I208 Other forms of angina pectoris: Secondary | ICD-10-CM | POA: Diagnosis not present

## 2019-12-11 DIAGNOSIS — I10 Essential (primary) hypertension: Secondary | ICD-10-CM | POA: Diagnosis not present

## 2019-12-11 DIAGNOSIS — E785 Hyperlipidemia, unspecified: Secondary | ICD-10-CM | POA: Diagnosis not present

## 2019-12-11 DIAGNOSIS — E039 Hypothyroidism, unspecified: Secondary | ICD-10-CM | POA: Diagnosis not present

## 2019-12-15 MED ORDER — BUTALBITAL-APAP-CAFFEINE 50-325-40 MG PO TABS: ORAL_TABLET | ORAL | 2 refills | Status: DC

## 2019-12-19 DIAGNOSIS — H524 Presbyopia: Secondary | ICD-10-CM | POA: Diagnosis not present

## 2020-01-12 ENCOUNTER — Other Ambulatory Visit: Payer: Self-pay | Admitting: Nurse Practitioner

## 2020-01-12 DIAGNOSIS — G8929 Other chronic pain: Secondary | ICD-10-CM

## 2020-03-13 ENCOUNTER — Other Ambulatory Visit: Payer: Self-pay | Admitting: Nurse Practitioner

## 2020-03-13 DIAGNOSIS — G8929 Other chronic pain: Secondary | ICD-10-CM

## 2020-03-17 ENCOUNTER — Other Ambulatory Visit: Payer: Self-pay | Admitting: Internal Medicine

## 2020-03-17 DIAGNOSIS — E781 Pure hyperglyceridemia: Secondary | ICD-10-CM

## 2020-03-26 ENCOUNTER — Other Ambulatory Visit: Payer: Self-pay

## 2020-03-26 ENCOUNTER — Emergency Department (HOSPITAL_COMMUNITY): Payer: Medicaid Other

## 2020-03-26 ENCOUNTER — Encounter (HOSPITAL_COMMUNITY): Payer: Self-pay

## 2020-03-26 ENCOUNTER — Emergency Department (HOSPITAL_COMMUNITY)
Admission: EM | Admit: 2020-03-26 | Discharge: 2020-03-27 | Disposition: A | Discharge status: Home / Self Care | Payer: Medicaid Other | Attending: Emergency Medicine | Admitting: Emergency Medicine

## 2020-03-26 DIAGNOSIS — R0602 Shortness of breath: Secondary | ICD-10-CM | POA: Insufficient documentation

## 2020-03-26 DIAGNOSIS — Z7982 Long term (current) use of aspirin: Secondary | ICD-10-CM | POA: Insufficient documentation

## 2020-03-26 DIAGNOSIS — J90 Pleural effusion, not elsewhere classified: Secondary | ICD-10-CM | POA: Diagnosis not present

## 2020-03-26 DIAGNOSIS — Z79899 Other long term (current) drug therapy: Secondary | ICD-10-CM | POA: Insufficient documentation

## 2020-03-26 DIAGNOSIS — R079 Chest pain, unspecified: Secondary | ICD-10-CM | POA: Diagnosis not present

## 2020-03-26 DIAGNOSIS — M25521 Pain in right elbow: Secondary | ICD-10-CM | POA: Diagnosis not present

## 2020-03-26 DIAGNOSIS — M79621 Pain in right upper arm: Secondary | ICD-10-CM | POA: Diagnosis not present

## 2020-03-26 DIAGNOSIS — Z8616 Personal history of COVID-19: Secondary | ICD-10-CM | POA: Diagnosis not present

## 2020-03-26 DIAGNOSIS — E039 Hypothyroidism, unspecified: Secondary | ICD-10-CM | POA: Diagnosis not present

## 2020-03-26 DIAGNOSIS — R112 Nausea with vomiting, unspecified: Secondary | ICD-10-CM | POA: Insufficient documentation

## 2020-03-26 DIAGNOSIS — R0902 Hypoxemia: Secondary | ICD-10-CM | POA: Diagnosis not present

## 2020-03-26 DIAGNOSIS — R61 Generalized hyperhidrosis: Secondary | ICD-10-CM | POA: Diagnosis not present

## 2020-03-26 DIAGNOSIS — R519 Headache, unspecified: Secondary | ICD-10-CM | POA: Diagnosis not present

## 2020-03-26 DIAGNOSIS — G4489 Other headache syndrome: Secondary | ICD-10-CM | POA: Diagnosis not present

## 2020-03-26 DIAGNOSIS — J45909 Unspecified asthma, uncomplicated: Secondary | ICD-10-CM | POA: Insufficient documentation

## 2020-03-26 DIAGNOSIS — I213 ST elevation (STEMI) myocardial infarction of unspecified site: Secondary | ICD-10-CM | POA: Diagnosis not present

## 2020-03-26 DIAGNOSIS — R0600 Dyspnea, unspecified: Secondary | ICD-10-CM | POA: Diagnosis not present

## 2020-03-26 DIAGNOSIS — R0789 Other chest pain: Secondary | ICD-10-CM | POA: Diagnosis not present

## 2020-03-26 LAB — BASIC METABOLIC PANEL
Anion gap: 7 (ref 5–15)
BUN: 10 mg/dL (ref 8–23)
CO2: 23 mmol/L (ref 22–32)
Calcium: 9.3 mg/dL (ref 8.9–10.3)
Chloride: 108 mmol/L (ref 98–111)
Creatinine, Ser: 1.29 mg/dL — ABNORMAL HIGH (ref 0.44–1.00)
GFR calc Af Amer: 52 mL/min — ABNORMAL LOW (ref >60)
GFR calc non Af Amer: 45 mL/min — ABNORMAL LOW (ref >60)
Glucose, Bld: 100 mg/dL — ABNORMAL HIGH (ref 70–99)
Potassium: 5 mmol/L (ref 3.5–5.1)
Sodium: 138 mmol/L (ref 135–145)

## 2020-03-26 LAB — CBC
HCT: 39.9 % (ref 36.0–46.0)
Hemoglobin: 12.7 g/dL (ref 12.0–15.0)
MCH: 29.3 pg (ref 26.0–34.0)
MCHC: 31.8 g/dL (ref 30.0–36.0)
MCV: 91.9 fL (ref 80.0–100.0)
Platelets: 320 10*3/uL (ref 150–400)
RBC: 4.34 MIL/uL (ref 3.87–5.11)
RDW: 13.2 % (ref 11.5–15.5)
WBC: 6.9 10*3/uL (ref 4.0–10.5)
nRBC: 0 % (ref 0.0–0.2)

## 2020-03-26 LAB — TROPONIN I (HIGH SENSITIVITY)
Troponin I (High Sensitivity): <2 ng/L (ref <18)
Troponin I (High Sensitivity): <2 ng/L (ref <18)

## 2020-03-26 LAB — CBG MONITORING, ED: Glucose-Capillary: 108 mg/dL — ABNORMAL HIGH (ref 70–99)

## 2020-03-26 MED ORDER — SODIUM CHLORIDE 0.9 % IV BOLUS
500.0000 mL | Freq: Once | INTRAVENOUS | Status: AC
  Administered 2020-03-26: 500 mL via INTRAVENOUS

## 2020-03-26 MED ORDER — PROCHLORPERAZINE MALEATE 5 MG PO TABS
10.0000 mg | ORAL_TABLET | Freq: Once | ORAL | Status: AC
  Administered 2020-03-26: 10 mg via ORAL
  Filled 2020-03-26: qty 2

## 2020-03-26 MED ORDER — ACETAMINOPHEN 500 MG PO TABS
1000.0000 mg | ORAL_TABLET | Freq: Once | ORAL | Status: AC
  Administered 2020-03-26: 1000 mg via ORAL
  Filled 2020-03-26: qty 2

## 2020-03-26 MED ORDER — DIPHENHYDRAMINE HCL 50 MG/ML IJ SOLN
50.0000 mg | Freq: Once | INTRAMUSCULAR | Status: AC
  Administered 2020-03-26: 50 mg via INTRAVENOUS
  Filled 2020-03-26: qty 1

## 2020-03-26 MED ORDER — MORPHINE SULFATE (PF) 4 MG/ML IV SOLN
4.0000 mg | Freq: Once | INTRAVENOUS | Status: AC
  Administered 2020-03-26: 4 mg via INTRAVENOUS
  Filled 2020-03-26: qty 1

## 2020-03-26 MED ORDER — MECLIZINE HCL 25 MG PO TABS
12.5000 mg | ORAL_TABLET | Freq: Once | ORAL | Status: AC
  Administered 2020-03-26: 12.5 mg via ORAL
  Filled 2020-03-26: qty 1

## 2020-03-26 MED ORDER — NITROGLYCERIN 0.4 MG SL SUBL
0.4000 mg | SUBLINGUAL_TABLET | SUBLINGUAL | Status: AC | PRN
  Administered 2020-03-26 (×2): 0.4 mg via SUBLINGUAL
  Filled 2020-03-26: qty 1

## 2020-03-26 NOTE — Discharge Instructions (Signed)
Your labwork was very reassuring today. I suspect your headache is a culmination of your migraines and the medication given for chest pain. Please go home, rest, and drink plenty of fluids to stay hydrated. You can take your Fioricet as needed for your headache when you get home.   You can schedule an appointment with [facility] regarding your recurrent migraine headaches.   Call you cardiologist on Monday regarding your ED visit today.   Return to the ED IMMEDIATELY for any worsening symptoms including new chest pain/shortness of breath, excessive vomiting, worsening headache, vision changes, confusion, speech changes, weakness or numbness on one side of your body, or any other new/concerning symptoms.

## 2020-03-26 NOTE — ED Triage Notes (Signed)
Pt BIB [facility] c/o of CP that started this morning but has worsen over the last hour. Pt states she has a sharp pain in her right arm that radiates to her chest causing pressure. Pt received 324 ASA and 1 nitro. 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 Pt reports she has a hx of migraines and the pain started with a migraine.

## 2020-03-26 NOTE — ED Notes (Signed)
Pt talking on room phone. I have called the lobby to see if son is present and can come back to visit.

## 2020-03-26 NOTE — ED Provider Notes (Signed)
[facility] EMERGENCY DEPARTMENT Provider Note   CSN: [id number] Arrival date & time: 03/26/20  1657     History Chief Complaint  Patient presents with  . Chest Pain  . Migraine    [name] is a 64 y.o. female with PMHx HTN, HLD, Adrenal insufficiency, CVA, CAD who presents to the ED today with complaint of sudden onset, constant, squeezing, diffuse chest pain that began around 10 AM this morning.  Patient reports late last night she began having a migraine headache.  She states this felt like a typical migraine headache.  She tried to go to sleep and ignored it however it kept her up until around 5 AM when she decided to take a Fioricet tablet.  Patient states she was able to go to sleep at that time however woke up later in the morning and reported that the headache had resolved.  Shortly afterwards she states she began having a squeezing sensation in her right arm radiating into her chest.  She states that it radiated up and did not start in her chest.  She tried to deal with it on her own however it got worse throughout the day prompting her to call EMS.  She states she also had shortness of breath, nausea, 2 episodes of nonbloody nonbilious emesis, diaphoresis.  She was given 324 mg aspirin and 1 nitroglycerin with EMS however per nursing staff her blood pressure dropped and so no more additional nitroglycerin was given.  Patient reports that she had a heart attack or stroke when she was 44 -she does have a sternal incision noted on exam.  She denies any issues since then.  Patient is a never smoker.  She does endorse family history of CAD.  She has no other complaints at this time.   The history is provided by the patient and medical records.       Past Medical History:  Diagnosis Date  . 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[employer])   . Amblyopia   . Anemia   . Arthritis    "in my lower back"  . Asthma   . Atrial myxoma   . Blindness of left eye 1978  . Blood  transfusion   . Breast cyst   . Constipation   . 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 CVA (cerebral infarction)   . Depression   . Depression   . 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 Hemorrhoids   . Hyperlipemia   . Hypothyroidism    per office note dated 03/25/2014  . Migraine   . Peptic ulcer associated with Helicobacter pylori infection   . Reflux   . Sinus headache   . Stomach ulcer    "from the hydrocortisone"  . Stroke [facility]) 1978   "piece tumor broke off & went to left eye; leaving me blind"  . Thyroid disease    pt unsure if hyper or hypo???    Patient Active Problem List   Diagnosis Date Noted  . Frequent PVCs   . COVID-19 10/10/2019  . Hypoxia   . Migraine with aura and without status migrainosus, not intractable   . Tachycardia   . ARF (acute renal failure) ([place]) 12/06/2018  . Fever 12/06/2018  . Acute hypoxemic respiratory failure ([place]) 12/06/2018  . Headache 11/02/2013  . Dizziness 11/02/2013  . Anemia 01/10/2013  . Chest pain, unspecified 01/10/2013  . Adjustment disorder with mixed anxiety and depressed mood 01/10/2013  . Adrenal crisis ([place]) 01/07/2013  . Nausea & vomiting 08/03/2011  .  Hypotension 08/03/2011  . Adrenal insufficiency ([place]) 08/03/2011  . Hyponatremia 08/03/2011  . Hypothyroidism 08/03/2011  . Depression 08/03/2011    Past Surgical History:  Procedure Laterality Date  . ADRENAL GLAND SURGERY  1982   on hydrocortisone  . ATRIAL MYXOMA EXCISION  1978  . BREAST BIOPSY  07/1999   ductal tissue excision & bx right; bx only left breast  . EYE SURGERY    . LEFT HEART CATHETERIZATION WITH CORONARY ANGIOGRAM N/A 10/06/2011   Procedure: LEFT HEART CATHETERIZATION WITH CORONARY ANGIOGRAM;  Surgeon: Clent Demark, MD;  Location: [facility] CATH LAB;  Service: Cardiovascular;  Laterality: N/A;  . repaired cross eyed     "as a child"  . ROTATOR CUFF REPAIR  05/2005   left  . VAGINAL HYSTERECTOMY  2000   "partial; w/left ovary"     OB History   No  obstetric history on file.     Family History  Problem Relation Age of Onset  . Colon cancer Sister 42  . Acromegaly Father   . Heart disease Father        Tumor in the heart  . Stomach cancer Father   . Diabetes Sister   . Kidney disease Sister        x2  . Heart disease Sister        Tumor in the heart  . Heart disease Daughter        Tumor in the heart  . Colon polyps Neg Hx   . Esophageal cancer Neg Hx   . Gallbladder disease Neg Hx     Social History   Tobacco Use  . Smoking status: Never Smoker  . Smokeless tobacco: Never Used  Vaping Use  . Vaping Use: Never used  Substance Use Topics  . Alcohol use: No  . Drug use: No    Home Medications Prior to Admission medications   Medication Sig Start Date End Date Taking? Authorizing Provider  aspirin EC 81 MG tablet Take 81 mg by mouth daily. Swallow whole.   Yes [provider]  butalbital-acetaminophen-caffeine (FIORICET) 50-325-40 MG tablet TAKE ONE TABLET BY MOUTH EVERY SIX HOURS AS NEEDED FOR HEADACHE OR migraine Patient taking differently: Take 1 tablet by mouth every 6 (six) hours as needed for headache or migraine.  12/15/19  Yes King, Diona Foley, NP  Cobalamin Combinations (NEURIVA PLUS PO) Take 1 capsule by mouth daily.   Yes [provider]  fenofibrate (TRICOR) 48 MG tablet Take 1 tablet (48 mg total) by mouth daily. 09/26/19  Yes Tresa Garter, MD  hydrocortisone (CORTEF) 20 MG tablet Take 0.5-1 tablets (10-20 mg total) by mouth 2 (two) times daily. Take 1 tablet every morning and 1/2 tablet every evening Patient taking differently: Take 20 mg by mouth See admin instructions. Take 20 mg by mouth with a meal and 10 mg at bedtime [birth year]  Yes Delora Fuel, MD  levothyroxine (SYNTHROID) 75 MCG tablet Take 75 mcg by mouth daily. 09/26/19  Yes [provider]  metoprolol tartrate (LOPRESSOR) 25 MG tablet Take 1 tablet (25 mg total) by mouth daily. 12/09/18  Yes Danford, Suann Larry,  MD  Omega 3 1000 MG CAPS Take 1,000 mg by mouth in the morning and at bedtime.   Yes [provider]  omeprazole (PRILOSEC) 20 MG capsule Take 1 capsule (20 mg total) by mouth daily. Patient taking differently: Take 20 mg by mouth 2 (two) times daily before a meal.  12/27/18  Yes Lanae Boast,  FNP  rosuvastatin (CRESTOR) 10 MG tablet Take 10 mg by mouth daily. 09/24/18  Yes [provider]  venlafaxine XR (EFFEXOR-XR) 150 MG 24 hr capsule Take 1 capsule (150 mg total) by mouth daily. 09/27/18  Yes Lanae Boast, FNP  omega-3 acid ethyl esters (LOVAZA) 1 g capsule Take 1 capsule (1 g total) by mouth 2 (two) times daily. 07/18/19   Tresa Garter, MD  traZODone (DESYREL) 50 MG tablet Take 0.5-1 tablets (25-50 mg total) by mouth at bedtime as needed for sleep. Patient taking differently: Take 50 mg by mouth at bedtime as needed for sleep.  09/27/18   Lanae Boast, FNP    Allergies    Aspirin, Ibuprofen [ibuprofen], and Penicillins  Review of Systems   Review of Systems  Constitutional: Positive for diaphoresis. Negative for chills and fever.  Eyes: Negative for visual disturbance.  Respiratory: Positive for shortness of breath. Negative for cough.   Cardiovascular: Positive for chest pain.  Gastrointestinal: Positive for nausea and vomiting.  Neurological: Positive for headaches. Negative for syncope, weakness and numbness.  All other systems reviewed and are negative.   Physical Exam Updated Vital Signs BP 95/70   Pulse 88   Temp 98 F (36.7 C) (Oral)   Resp 17   SpO2 97%   Physical Exam Vitals and nursing note reviewed.  Constitutional:      Appearance: She is not ill-appearing or diaphoretic.  HENT:     Head: Normocephalic and atraumatic.  Eyes:     Extraocular Movements: Extraocular movements intact.     Conjunctiva/sclera: Conjunctivae normal.     Pupils: Pupils are equal, round, and reactive to light.  Cardiovascular:     Rate and Rhythm: Normal  rate and regular rhythm.     Pulses:          Radial pulses are 2+ on the right side and 2+ on the left side.       Dorsalis pedis pulses are 2+ on the right side and 2+ on the left side.  Pulmonary:     Effort: Pulmonary effort is normal.     Breath sounds: Normal breath sounds. No decreased breath sounds, wheezing, rhonchi or rales.  Chest:     Chest wall: No tenderness.     Comments: Sternal incision noted Abdominal:     Palpations: Abdomen is soft.     Tenderness: There is no abdominal tenderness. There is no guarding or rebound.  Musculoskeletal:     Cervical back: Neck supple.     Right lower leg: No tenderness. No edema.     Left lower leg: No tenderness. No edema.     Comments: + diffuse TTP to R arm however appears worse to the elbow joint; ROM intact to shoulder, elbow, and wrist; no edema noted; no overlying skin changes; strength and sensation intact; 2+ radial pulse  Skin:    General: Skin is warm and dry.  Neurological:     Mental Status: She is alert.     Comments: CN 3-12 grossly intact A&O x4 GCS 15 Sensation and strength intact Coordination with finger-to-nose WNL Neg romberg, neg pronator drift     ED Results / Procedures / Treatments   Labs (all labs ordered are listed, but only abnormal results are displayed) Labs Reviewed  BASIC METABOLIC PANEL - Abnormal; Notable for the following components:      Result Value   Glucose, Bld 100 (*)    Creatinine, Ser 1.29 (*)    GFR  calc non Af Amer 45 (*)    GFR calc Af Amer 52 (*)    All other components within normal limits  CBG MONITORING, ED - Abnormal; Notable for the following components:   Glucose-Capillary 108 (*)    All other components within normal limits  CBC  TROPONIN I (HIGH SENSITIVITY)  TROPONIN I (HIGH SENSITIVITY)    EKG EKG Interpretation  Date/Time:  Friday March 26 2020 16:58:42 EDT Ventricular Rate:  81 PR Interval:    QRS Duration: 95 QT Interval:  373 QTC Calculation: 433 R  Axis:   45 Text Interpretation: Sinus rhythm Nonspecific T abnormalities, anterior leads No significant change since last tracing Confirmed by Blanchie Dessert [phone]) on 03/26/2020 5:04:31 PM   Radiology DG Elbow Complete Right  Result Date: 03/26/2020 CLINICAL DATA:  Right elbow pain.  No known injury. EXAM: RIGHT ELBOW - COMPLETE 3+ VIEW COMPARISON:  None. FINDINGS: There is no evidence of fracture, dislocation, or joint effusion. There is no evidence of arthropathy or other focal bone abnormality. Soft tissues are unremarkable. IMPRESSION: Negative radiographs of the right elbow. Electronically Signed   By: Keith Rake M.D.   On: 03/26/2020 19:57   DG Chest Portable 1 View  Result Date: 03/26/2020 CLINICAL DATA:  Chest pain, dyspnea EXAM: PORTABLE CHEST 1 VIEW COMPARISON:  10/10/2019 FINDINGS: Lung volumes are small, but are symmetric and are clear. No pneumothorax or pleural effusion. Cardiac size is within normal limits. Epicardial pacer leads again noted. Median sternotomy has been performed. Pulmonary vascularity is normal. IMPRESSION: No active disease. Electronically Signed   By: Fidela Salisbury MD   On: 03/26/2020 18:27    Procedures Procedures (including critical care time)  Medications Ordered in ED Medications  nitroGLYCERIN (NITROSTAT) SL tablet 0.4 mg (0.4 mg Sublingual Given 03/26/20 1758)  acetaminophen (TYLENOL) tablet 1,000 mg (1,000 mg Oral Given 03/26/20 1946)  prochlorperazine (COMPAZINE) tablet 10 mg (10 mg Oral Given 03/26/20 1946)  diphenhydrAMINE (BENADRYL) injection 50 mg (50 mg Intravenous Given 03/26/20 1946)  sodium chloride 0.9 % bolus 500 mL (0 mLs Intravenous Stopped 03/26/20 2124)  morphine 4 MG/ML injection 4 mg (4 mg Intravenous Given 03/26/20 2123)  meclizine (ANTIVERT) tablet 12.5 mg (12.5 mg Oral Given 03/26/20 2256)    ED Course  I have reviewed the triage vital signs and the nursing notes.  Pertinent labs & imaging results that were available  during my care of the patient were reviewed by me and considered in my medical decision making (see chart for details).    MDM Rules/Calculators/A&P                          64 year old female presents to the ED via EMS with complaint of chest pain that started around 10 AM this morning.  Reports either history of a MI or stroke at the age of 65 however patient does have a sternal incision noted.  Unable to ascertain additional history as patient is poor historian.  She does report that her pain started with a migraine headache yesterday, history of same, no worse than normal.  Began having chest pain after taking Fioricet several hours prior.  She is not currently having a headache. She has no focal neuro deficits on exam today.  She has equal pulses in her bilateral upper and bilateral lower extremities.  She was given 324 mg aspirin and 1 nitro in route, still complaining of chest pain.  Will give additional nitro at  this time and work up for ACS.   EKG with nonspecific T wave abnormalities, unchanged from previous. Chest x-ray clear. CBC without leukocytosis.  Hemoglobin stable at 12.7. BMP with creatinine 1.29, appears to be around patient's baseline.   Lab Results  Component Value Date   CREATININE 1.29 (H) 03/26/2020   CREATININE 0.87 10/12/2019   CREATININE 1.15 (H) 10/11/2019   Troponin less than 2.  On reevaluation patient states that she has no longer having chest pain however she is more concerned about the pain in her arm.  On further evaluation patient has tenderness diffusely to her right upper extremity however worse in the elbow joint.  There is no overlying skin changes and patient denies any trauma to the area.  She has equal strength and sensation in this extremity.  Will obtain an x-ray of the elbow however difficult to assess what is actually the cause of patient's pain at this time.  She states she is also now having a headache again, will provide headache  cocktail.  Repeat troponin < 2.  Xray of elbow without acute findings.   On reevaluation pt texting on her phone however states her headache is still a 10/10 despite HA cocktail incluidng fluids, tylenol, compazine, and benadryl. She reports her arm pain has resolved. I suspect her headache is likely s/2 to the nitro given earlier vs migraine. Have ordered morphine to see if this will help.   After morphine pt reports it made her nauseated and worsened her headache however now she is mostly complaining of a room spinning sensation. Son reports this typically happens when pt has a headache. Pt without documented hx of vertigo however will provide meclizine and reevaluate. Her labwork is otherwise reassuring and pt does not need admission at this time.   Pt resting comfortably in a darkened room snoring. Will discharge patient home at this time. Suspect headache s/2 NTG in the ED. Have encouraged increasing oral intake at home and following up with PCP. Pt to contact her cardiologist regarding ED visit today as well. Strict return precautions discussed with pt and son. They are both in agreement with plan and pt stable for discharge home.   This note was prepared using Dragon voice recognition software and may include unintentional dictation errors due to the inherent limitations of voice recognition software.  Final Clinical Impression(s) / ED Diagnoses Final diagnoses:  Nonspecific chest pain  Bad headache    Rx / DC Orders ED Discharge Orders    None       Eustaquio Maize, PA-C 03/26/20 2334    Blanchie Dessert, MD 03/28/20 [phone]

## 2020-03-29 ENCOUNTER — Telehealth: Payer: Self-pay | Admitting: *Deleted

## 2020-03-29 NOTE — Telephone Encounter (Signed)
Attempted to contact pt to complete transition of care assessment; left message on voicemail.  Katrice Gerron Guidotti, RN, BSN, CCRN Patient Engagement [facility] [phone]  

## 2020-03-29 NOTE — Telephone Encounter (Signed)
  Transition Care Management Follow-up Telephone Call  . Medicaid Managed Care Transition Call Status:MM [facility] Call Made  . Date of discharge and from where: 03/27/20 [facility]  . How have you been since you were released from the hospital? ok  . Any questions or concerns? Yes, BP low (pt states she has been trying to reach PCP without success;    Items Reviewed: Marland Kitchen Did the pt receive and understand the discharge instructions provided? Yes pt states  . Medications obtained and verified? Yes  . Any new allergies since your discharge? No  . Dietary orders reviewed? yes . Do you have support at home? yes Functional Questionnaire: (I = Independent and D = Dependent)  ADLs: Independent Bathing/Dressing:Independent Meal Prep: Independent Eating: Independent Maintaining continence: Independent Transferring/Ambulation: Independent Managing Meds: Independent Follow up appointments reviewed:  PCP Hospital f/u appt confirmed? No  Pt to call Dr Doreene Burke 03/29/20; address and phone number given  [facility] f/u appt confirmed? No Pt to call [facility] 03/29/20; number & address given Are transportation arrangements needed? No   If their condition worsens, is the pt aware to call PCP or go to the EmergencyDept.? yes Was the patient provided with contact information for the PCP's office or ED? yes  Was to pt encouraged to call back with questions or concerns? yes  Lenor Coffin, RN, BSN, [place] Patient [place] [phone]

## 2020-04-01 ENCOUNTER — Ambulatory Visit (INDEPENDENT_AMBULATORY_CARE_PROVIDER_SITE_OTHER): Payer: Medicaid Other | Admitting: Nurse Practitioner

## 2020-04-01 ENCOUNTER — Encounter: Payer: Self-pay | Admitting: Nurse Practitioner

## 2020-04-01 ENCOUNTER — Other Ambulatory Visit: Payer: Self-pay

## 2020-04-01 VITALS — BP 94/51 | HR 70 | Temp 96.6°F | Resp 16 | Ht 59.0 in | Wt 209.4 lb

## 2020-04-01 DIAGNOSIS — R7989 Other specified abnormal findings of blood chemistry: Secondary | ICD-10-CM | POA: Diagnosis not present

## 2020-04-01 DIAGNOSIS — G43109 Migraine with aura, not intractable, without status migrainosus: Secondary | ICD-10-CM | POA: Diagnosis not present

## 2020-04-01 DIAGNOSIS — E039 Hypothyroidism, unspecified: Secondary | ICD-10-CM | POA: Diagnosis not present

## 2020-04-01 DIAGNOSIS — Z8616 Personal history of COVID-19: Secondary | ICD-10-CM

## 2020-04-01 MED ORDER — SUMATRIPTAN SUCCINATE 25 MG PO TABS: 25.0000 mg | ORAL_TABLET | ORAL | 0 refills | Status: DC | PRN

## 2020-04-01 NOTE — Progress Notes (Signed)
[address] [place], [place]  [address] Phone:  [phone]   Fax:  [phone]   Established Patient Office Visit  Subjective:  Patient ID: [name], female    DOB: 1955-10-13  Age: 64 y.o. MRN: [id number]  CC:  Chief Complaint  Patient presents with  . Follow-up    Pt states she is here for her f/u. Pt states her migrain headache medicine  is not working. Pt states 03/26/20  she went went to [place] for a sharp pain that start head then went behind her ear. Pt states also the sharp pain traveled up her right arm.    HPI ADRIENNE Stevenson presents for follow up. 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  She is in today for ER follow-up.  She was seen in the emergency room on 03/26/2020 for nonspecific chest pain work-up was negative.  She admits that she is seeing her cardiologist prior to the visit.  She admits that she is feeling better since then.  Her main concern today is her headaches.  Headache Patient presents for evaluation of headache. Symptoms began about several months ago.  She feels like the headaches may have gotten worse since she had Covid in February 2021.  Generally, the headaches last about all day and occur every day. The headaches do not seem to be related to any time of the day. The headaches are usually sharp and are located in the front of her head that goes to the top.  The patient rates her most severe headaches a 6 on a scale from 1 to 10. Recently, the headaches have been  increasing in both severity and frequency. Work attendance or other daily activities are not affected by the headaches. Precipitating factors include: none which have been determined. The headaches are usually not preceded by an aura. Associated neurologic symptoms: dizziness, vision problems and Leg weakness right greater than left. The patient denies depression, loss of balance, speech difficulties and vomiting in the early morning. Home treatment has included fioricet with no improvement. Other history includes: migraine headaches diagnosed in the past. Family history includes no known family members with significant headaches.  She was seen by the eye doctor in May 2021 and is supposed to be wearing glasses.  She admits that she has not started wearing her glasses at this time.   Past Medical History:  Diagnosis Date  . 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 Amblyopia   . Anemia   . Arthritis    "in my lower back"  . Asthma   . Atrial myxoma   . Blindness of left eye 1978  . Blood transfusion   . Breast cyst   . Constipation   . 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 CVA (cerebral infarction)   . Depression   . Depression   . 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 Hemorrhoids   . Hyperlipemia   . Hypothyroidism    per office note dated 03/25/2014  . Migraine   . Peptic ulcer associated with Helicobacter pylori infection   . Reflux   . Sinus headache   . Stomach ulcer    "  from the hydrocortisone"  . Stroke [facility]) 1978   "piece tumor broke off & went to left eye; leaving me blind"  . Thyroid disease    pt unsure if hyper or hypo???    Past Surgical History:  Procedure Laterality Date  . ADRENAL GLAND SURGERY  1982   on hydrocortisone  . ATRIAL MYXOMA EXCISION  1978  . BREAST BIOPSY  07/1999   ductal tissue excision & bx right; bx only left breast  . EYE SURGERY    . LEFT HEART CATHETERIZATION WITH CORONARY ANGIOGRAM N/A 10/06/2011   Procedure: LEFT HEART CATHETERIZATION WITH CORONARY  ANGIOGRAM;  Surgeon: Clent Demark, MD;  Location: [facility] CATH LAB;  Service: Cardiovascular;  Laterality: N/A;  . repaired cross eyed     "as a child"  . ROTATOR CUFF REPAIR  05/2005   left  . VAGINAL HYSTERECTOMY  2000   "partial; w/left ovary"    Family History  Problem Relation Age of Onset  . Colon cancer Sister 61  . Acromegaly Father   . Heart disease Father        Tumor in the heart  . Stomach cancer Father   . Diabetes Sister   . Kidney disease Sister        x2  . Heart disease Sister        Tumor in the heart  . Heart disease Daughter        Tumor in the heart  . Colon polyps Neg Hx   . Esophageal cancer Neg Hx   . Gallbladder disease Neg Hx     Social History   Socioeconomic History  . Marital status: Married    Spouse name: Not on file  . Number of children: 2  . Years of education: Not on file  . Highest education level: Not on file  Occupational History  . Occupation: Disabled  Tobacco Use  . Smoking status: Never Smoker  . Smokeless tobacco: Never Used  Vaping Use  . Vaping Use: Never used  Substance and Sexual Activity  . Alcohol use: No  . Drug use: No  . Sexual activity: Yes  Other Topics Concern  . Not on file  Social History Narrative  . Not on file   Social Determinants of Health   Financial Resource Strain:   . Difficulty of Paying Living Expenses:   Food Insecurity:   . Worried About Charity fundraiser in the Last Year:   . Arboriculturist in the Last Year:   Transportation Needs:   . Film/video editor (Medical):   Marland Kitchen Lack of Transportation (Non-Medical):   Physical Activity:   . Days of Exercise per Week:   . Minutes of Exercise per Session:   Stress:   . Feeling of Stress :   Social Connections:   . Frequency of Communication with Friends and Family:   . Frequency of Social Gatherings with Friends and Family:   . Attends Religious Services:   . Active Member of Clubs or Organizations:   . Attends Archivist  Meetings:   Marland Kitchen Marital Status:   Intimate Partner Violence:   . Fear of Current or Ex-Partner:   . Emotionally Abused:   Marland Kitchen Physically Abused:   . Sexually Abused:     Outpatient Medications Prior to Visit  Medication Sig Dispense Refill  . aspirin EC 81 MG tablet Take 81 mg by mouth daily. Swallow whole.    . butalbital-acetaminophen-caffeine (FIORICET) 50-325-40 MG tablet  TAKE ONE TABLET BY MOUTH EVERY SIX HOURS AS NEEDED FOR HEADACHE OR migraine (Patient taking differently: Take 1 tablet by mouth every 6 (six) hours as needed for headache or migraine. ) 30 tablet 2  . Cobalamin Combinations (NEURIVA PLUS PO) Take 1 capsule by mouth daily.    . fenofibrate (TRICOR) 48 MG tablet Take 1 tablet (48 mg total) by mouth daily. 90 tablet 3  . hydrocortisone (CORTEF) 20 MG tablet Take 0.5-1 tablets (10-20 mg total) by mouth 2 (two) times daily. Take 1 tablet every morning and 1/2 tablet every evening (Patient taking differently: Take 20 mg by mouth See admin instructions. Take 20 mg by mouth with a meal and 10 mg at bedtime) 45 tablet o  . levothyroxine (SYNTHROID) 75 MCG tablet Take 75 mcg by mouth daily.    . metoprolol tartrate (LOPRESSOR) 25 MG tablet Take 1 tablet (25 mg total) by mouth daily.    Marland Kitchen omega-3 acid ethyl esters (LOVAZA) 1 g capsule Take 1 capsule (1 g total) by mouth 2 (two) times daily. 180 capsule 3  . omeprazole (PRILOSEC) 20 MG capsule Take 1 capsule (20 mg total) by mouth daily. (Patient taking differently: Take 20 mg by mouth 2 (two) times daily before a meal. ) 30 capsule 2  . rosuvastatin (CRESTOR) 10 MG tablet Take 10 mg by mouth daily.    . traZODone (DESYREL) 50 MG tablet Take 0.5-1 tablets (25-50 mg total) by mouth at bedtime as needed for sleep. (Patient taking differently: Take 50 mg by mouth at bedtime as needed for sleep. ) 30 tablet 2  . venlafaxine XR (EFFEXOR-XR) 150 MG 24 hr capsule Take 1 capsule (150 mg total) by mouth daily. 30 capsule 2  . Omega 3 1000 MG CAPS  Take 1,000 mg by mouth in the morning and at bedtime.     No facility-administered medications prior to visit.    Allergies  Allergen Reactions  . Aspirin Other (See Comments)    Can take baby aspirin (heart flutters with high doses)  . Ibuprofen [Ibuprofen] Nausea And Vomiting  . Penicillins Nausea And Vomiting    Has patient had a PCN reaction causing immediate rash, facial/tongue/throat swelling, SOB or lightheadedness with hypotension: Yes Has patient had a PCN reaction causing severe rash involving mucus membranes or skin necrosis: No Has patient had a PCN reaction that required hospitalization No Has patient had a PCN reaction occurring within the last 10 years: No If all of the above answers are "NO", then may proceed with Cephalosporin use.     ROS Review of Systems    Objective:    Physical Exam Constitutional:      General: She is not in acute distress.    Appearance: She is obese. She is not ill-appearing.  Cardiovascular:     Rate and Rhythm: Normal rate and regular rhythm.     Pulses: Normal pulses.     Heart sounds: Normal heart sounds.  Pulmonary:     Effort: Pulmonary effort is normal.     Breath sounds: Normal breath sounds.  Abdominal:     Palpations: Abdomen is soft.  Musculoskeletal:     Cervical back: Normal range of motion.  Skin:    General: Skin is warm and dry.  Neurological:     Mental Status: She is alert and oriented to person, place, and time. Mental status is at baseline.  Psychiatric:        Mood and Affect: Mood normal.  Behavior: Behavior normal.        Thought Content: Thought content normal.        Judgment: Judgment normal.     BP (!) 94/51 (BP Location: Right Arm, Patient Position: Sitting, Cuff Size: Normal)   Pulse 70   Temp (!) 96.6 F (35.9 C)   Resp 16   Ht 4\' 11"  (1.499 m)   Wt (!) 209 lb 6.4 oz (95 kg)   SpO2 99%   BMI 42.29 kg/m  Wt Readings from Last 3 Encounters:  04/01/20 (!) 209 lb 6.4 oz (95 kg)    03/26/20 175 lb (79.4 kg)  10/12/19 211 lb 13.8 oz (96.1 kg)     Health Maintenance Due  Topic Date Due  . COVID-19 Vaccine (1) Never done  . PAP SMEAR-Modifier  Never done  . MAMMOGRAM  08/16/2008  . COLONOSCOPY  11/16/2019    There are no preventive care reminders to display for this patient.  Lab Results  Component Value Date   TSH 0.247 (L) 04/01/2020   Lab Results  Component Value Date   WBC 6.9 03/26/2020   HGB 12.7 03/26/2020   HCT 39.9 03/26/2020   MCV 91.9 03/26/2020   PLT 320 03/26/2020   Lab Results  Component Value Date   NA 137 04/01/2020   K 4.5 04/01/2020   CO2 23 03/26/2020   GLUCOSE 186 (H) 04/01/2020   BUN 17 04/01/2020   CREATININE 1.13 (H) 04/01/2020   BILITOT <0.2 04/01/2020   ALKPHOS 60 04/01/2020   AST 21 04/01/2020   ALT 20 10/12/2019   PROT 6.5 04/01/2020   ALBUMIN 4.0 04/01/2020   CALCIUM 9.7 04/01/2020   ANIONGAP 7 03/26/2020   Lab Results  Component Value Date   CHOL 164 09/27/2018   Lab Results  Component Value Date   HDL 50 09/27/2018   Lab Results  Component Value Date   LDLCALC 73 09/27/2018   Lab Results  Component Value Date   TRIG 198 (H) 10/10/2019   Lab Results  Component Value Date   CHOLHDL 3.3 09/27/2018   No results found for: HGBA1C    Assessment & Plan:   Problem List Items Addressed This Visit      Cardiovascular and Mediastinum   Migraine with aura and without status migrainosus, not intractable - Primary   Relevant Medications   SUMAtriptan (IMITREX) 25 MG tablet Discontinue use of Fioricet.  Encourage patient to make sure that she is hydrating well. She has an appointment to see neurology in the near future. Recent CT scan January 2021 was negative.  Encourage patient to follow-up if symptoms persist or get worse     Endocrine   Hypothyroidism (Chronic)   Relevant Orders   TSH (Completed)   T4, free (Completed)   T3 (Completed) We will continue with current regimen    Other Visit  Diagnoses    Elevated serum creatinine       Relevant Orders   Comp. Metabolic Panel (12) (Completed) We will reevaluate serum creatinine.  Personal history of COVID-19 May recommend patient to follow-up with [facility] if symptoms persist or get worse for further evaluation      Meds ordered this encounter  Medications  . SUMAtriptan (IMITREX) 25 MG tablet    Sig: Take 1 tablet (25 mg total) by mouth every 2 (two) hours as needed for migraine. May repeat in 2 hours if headache persists or recurs.    Dispense:  10 tablet    Refill:  0    Order Specific Question:   Supervising Provider    Answer:   Tresa Garter [[id number]]    Follow-up: Return in about 3 months (around 07/02/2020).    Vevelyn Francois, NP

## 2020-04-01 NOTE — Patient Instructions (Addendum)
Migraine Headache A migraine headache is a very strong throbbing pain on one side or both sides of your head. This type of headache can also cause other symptoms. It can last from 4 hours to 3 days. Talk with your doctor about what things may bring on (trigger) this condition. What are the causes? The exact cause of this condition is not known. This condition may be triggered or caused by:  Drinking alcohol.  Smoking.  Taking medicines, such as: ? Medicine used to treat chest pain (nitroglycerin). ? Birth control pills. ? Estrogen. ? Some blood pressure medicines.  Eating or drinking certain products.  Doing physical activity. Other things that may trigger a migraine headache include:  Having a menstrual period.  Pregnancy.  Hunger.  Stress.  Not getting enough sleep or getting too much sleep.  Weather changes.  Tiredness (fatigue). What increases the risk?  Being 25-55 years old.  Being female.  Having a family history of migraine headaches.  Being Caucasian.  Having depression or anxiety.  Being very overweight. What are the signs or symptoms?  A throbbing pain. This pain may: ? Happen in any area of the head, such as on one side or both sides. ? Make it hard to do daily activities. ? Get worse with physical activity. ? Get worse around bright lights or loud noises.  Other symptoms may include: ? Feeling sick to your stomach (nauseous). ? Vomiting. ? Dizziness. ? Being sensitive to bright lights, loud noises, or smells.  Before you get a migraine headache, you may get warning signs (an aura). An aura may include: ? Seeing flashing lights or having blind spots. ? Seeing bright spots, halos, or zigzag lines. ? Having tunnel vision or blurred vision. ? Having numbness or a tingling feeling. ? Having trouble talking. ? Having weak muscles.  Some people have symptoms after a migraine headache (postdromal phase), such as: ? Tiredness. ? Trouble  thinking (concentrating). How is this treated?  Taking medicines that: ? Relieve pain. ? Relieve the feeling of being sick to your stomach. ? Prevent migraine headaches.  Treatment may also include: ? Having acupuncture. ? Avoiding foods that bring on migraine headaches. ? Learning ways to control your body functions (biofeedback). ? Therapy to help you know and deal with negative thoughts (cognitive behavioral therapy). Follow these instructions at home: Medicines  Take over-the-counter and prescription medicines only as told by your doctor.  Ask your doctor if the medicine prescribed to you: ? Requires you to avoid driving or using heavy machinery. ? Can cause trouble pooping (constipation). You may need to take these steps to prevent or treat trouble pooping:  Drink enough fluid to keep your pee (urine) pale yellow.  Take over-the-counter or prescription medicines.  Eat foods that are high in fiber. These include beans, whole grains, and fresh fruits and vegetables.  Limit foods that are high in fat and sugar. These include fried or sweet foods. Lifestyle  Do not drink alcohol.  Do not use any products that contain nicotine or tobacco, such as cigarettes, e-cigarettes, and chewing tobacco. If you need help quitting, ask your doctor.  Get at least 8 hours of sleep every night.  Limit and deal with stress. General instructions      Keep a journal to find out what may bring on your migraine headaches. For example, write down: ? What you eat and drink. ? How much sleep you get. ? Any change in what you eat or drink. ? Any   change in your medicines.  If you have a migraine headache: ? Avoid things that make your symptoms worse, such as bright lights. ? It may help to lie down in a dark, quiet room. ? Do not drive or use heavy machinery. ? Ask your doctor what activities are safe for you.  Keep all follow-up visits as told by your doctor. This is important. Contact  a doctor if:  You get a migraine headache that is different or worse than others you have had.  You have more than 15 headache days in one month. Get help right away if:  Your migraine headache gets very bad.  Your migraine headache lasts longer than 72 hours.  You have a fever.  You have a stiff neck.  You have trouble seeing.  Your muscles feel weak or like you cannot control them.  You start to lose your balance a lot.  You start to have trouble walking.  You pass out (faint).  You have a seizure. Summary  A migraine headache is a very strong throbbing pain on one side or both sides of your head. These headaches can also cause other symptoms.  This condition may be treated with medicines and changes to your lifestyle.  Keep a journal to find out what may bring on your migraine headaches.  Contact a doctor if you get a migraine headache that is different or worse than others you have had.  Contact your doctor if you have more than 15 headache days in a month. This information is not intended to replace advice given to you by your health care provider. Make sure you discuss any questions you have with your health care provider. Document Revised: 12/20/2018 Document Reviewed: 10/10/2018 Elsevier Patient Education  [place]. Sumatriptan tablets What is this medicine? SUMATRIPTAN (soo ma TRIP tan) is used to treat migraines with or without aura. An aura is a strange feeling or visual disturbance that warns you of an attack. It is not used to prevent migraines. This medicine may be used for other purposes; ask your health care provider or pharmacist if you have questions. COMMON BRAND NAME(S): Imitrex, Migraine Pack What should I tell my health care provider before I take this medicine? They need to know if you have any of these conditions:  cigarette smoker  circulation problems in fingers and toes  diabetes  heart disease  high blood pressure  high  cholesterol  history of irregular heartbeat  history of stroke  kidney disease  liver disease  stomach or intestine problems  an unusual or allergic reaction to sumatriptan, other medicines, foods, dyes, or preservatives  pregnant or trying to get pregnant  breast-feeding How should I use this medicine? Take this medicine by mouth with a glass of water. Follow the directions on the prescription label. Do not take it more often than directed. Talk to your pediatrician regarding the use of this medicine in children. Special care may be needed. Overdosage: If you think you have taken too much of this medicine contact a poison control center or emergency room at once. NOTE: This medicine is only for you. Do not share this medicine with others. What if I miss a dose? This does not apply. This medicine is not for regular use. What may interact with this medicine? Do not take this medicine with any of the following medicines:  certain medicines for migraine headache like almotriptan, eletriptan, frovatriptan, naratriptan, rizatriptan, sumatriptan, zolmitriptan  ergot alkaloids like dihydroergotamine, ergonovine, ergotamine, methylergonovine  MAOIs like Carbex, Eldepryl, Marplan, Nardil, and Parnate This medicine may also interact with the following medications:  certain medicines for depression, anxiety, or psychotic disorders This list may not describe all possible interactions. Give your health care provider a list of all the medicines, herbs, non-prescription drugs, or dietary supplements you use. Also tell them if you smoke, drink alcohol, or use illegal drugs. Some items may interact with your medicine. What should I watch for while using this medicine? Visit your healthcare professional for regular checks on your progress. Tell your healthcare professional if your symptoms do not start to get better or if they get worse. You may get drowsy or dizzy. Do not drive, use machinery,  or do anything that needs mental alertness until you know how this medicine affects you. Do not stand up or sit up quickly, especially if you are an older patient. This reduces the risk of dizzy or fainting spells. Alcohol may interfere with the effect of this medicine. Tell your healthcare professional right away if you have any change in your eyesight. If you take migraine medicines for 10 or more days a month, your migraines may get worse. Keep a diary of headache days and medicine use. Contact your healthcare professional if your migraine attacks occur more frequently. What side effects may I notice from receiving this medicine? Side effects that you should report to your doctor or health care professional as soon as possible:  allergic reactions like skin rash, itching or hives, swelling of the face, lips, or tongue  changes in vision  chest pain or chest tightness  signs and symptoms of a dangerous change in heartbeat or heart rhythm like chest pain; dizziness; fast, irregular heartbeat; palpitations; feeling faint or lightheaded; falls; breathing problems  signs and symptoms of a stroke like changes in vision; confusion; trouble speaking or understanding; severe headaches; sudden numbness or weakness of the face, arm or leg; trouble walking; dizziness; loss of balance or coordination  signs and symptoms of serotonin syndrome like irritable; confusion; diarrhea; fast or irregular heartbeat; muscle twitching; stiff muscles; trouble walking; sweating; high fever; seizures; chills; vomiting Side effects that usually do not require medical attention (report to your doctor or health care professional if they continue or are bothersome):  diarrhea  dizziness  drowsiness  dry mouth  headache  nausea, vomiting  pain, tingling, numbness in the hands or feet  stomach pain This list may not describe all possible side effects. Call your doctor for medical advice about side effects. You  may report side effects to FDA at 1-800-FDA-1088. Where should I keep my medicine? Keep out of the reach of children. Store at room temperature between 2 and 30 degrees C (36 and 86 degrees F). Throw away any unused medicine after the expiration date. NOTE: This sheet is a summary. It may not cover all possible information. If you have questions about this medicine, talk to your doctor, pharmacist, or health care provider.  2020 Elsevier/Gold Standard (2018-03-12 15:05:37)

## 2020-04-02 LAB — COMP. METABOLIC PANEL (12)
AST: 21 IU/L (ref 0–40)
Albumin/Globulin Ratio: 1.6 (ref 1.2–2.2)
Albumin: 4 g/dL (ref 3.8–4.8)
Alkaline Phosphatase: 60 IU/L (ref 48–121)
BUN/Creatinine Ratio: 15 (ref 12–28)
BUN: 17 mg/dL (ref 8–27)
Bilirubin Total: <0.2 mg/dL (ref 0.0–1.2)
Calcium: 9.7 mg/dL (ref 8.7–10.3)
Chloride: 102 mmol/L (ref 96–106)
Creatinine, Ser: 1.13 mg/dL — ABNORMAL HIGH (ref 0.57–1.00)
GFR calc Af Amer: 61 mL/min/{1.73_m2} (ref >59)
GFR calc non Af Amer: 53 mL/min/{1.73_m2} — ABNORMAL LOW (ref >59)
Globulin, Total: 2.5 g/dL (ref 1.5–4.5)
Glucose: 186 mg/dL — ABNORMAL HIGH (ref 65–99)
Potassium: 4.5 mmol/L (ref 3.5–5.2)
Sodium: 137 mmol/L (ref 134–144)
Total Protein: 6.5 g/dL (ref 6.0–8.5)

## 2020-04-02 LAB — TSH: TSH: 0.247 u[IU]/mL — ABNORMAL LOW (ref 0.450–4.500)

## 2020-04-02 LAB — T4, FREE: Free T4: 1.1 ng/dL (ref 0.82–1.77)

## 2020-04-02 LAB — T3: T3, Total: 151 ng/dL (ref 71–180)

## 2020-04-05 ENCOUNTER — Telehealth: Payer: Self-pay

## 2020-04-05 ENCOUNTER — Other Ambulatory Visit: Payer: Self-pay | Admitting: Nurse Practitioner

## 2020-04-05 MED ORDER — LEVOTHYROXINE SODIUM 75 MCG PO TABS: 75.0000 ug | ORAL_TABLET | Freq: Every day | ORAL | 2 refills | Status: DC

## 2020-04-05 NOTE — Telephone Encounter (Signed)
-----   Message from Mikayla Francois, NP sent at 04/05/2020  9:27 AM EDT ----- Blood glucose is slightly elevated we will recheck with next appointmentSerum creatinine slightly elevated will monitorTSH is slightly decreased.  If she would like to change her Synthroid to 50 mcg versus a 75 we can do that and then recheck at her next visit.  Please let me know thanks

## 2020-04-05 NOTE — Telephone Encounter (Signed)
Called and spoke with patient, advised that tsh was slightly decreased and that we can change dosage if needed. Patient does request that this be done. I have notified provider. Advised that creatinine is slightly elevated and we will continue to monitor. Patient verbalized understanding. Thanks!

## 2020-05-06 ENCOUNTER — Other Ambulatory Visit: Payer: Self-pay | Admitting: Nurse Practitioner

## 2020-05-06 DIAGNOSIS — G43109 Migraine with aura, not intractable, without status migrainosus: Secondary | ICD-10-CM

## 2020-05-24 NOTE — Progress Notes (Signed)
[facility]    Provider:  Dr Jaynee Eagles Requesting Provider: Tresa Garter, MD Primary Care Provider:  Tresa Garter, MD  CC:  Intractable migraines  HPI:  [name] is a 64 y.o. female here as requested by Tresa Garter, MD for migraines.  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 I reviewed epic notes, patient was seen in the emergency room in July of this year for migraine: She had a sudden onset constant diffuse chest pain, the night before she started having migraine headaches, she tried to go to sleep and ignore it however it kept her up around 5 AM she took a Fioricet, when she woke up up later in the morning and she reported the headache had resolved. she also reported other symptoms such as shortness of breath, nausea, emesis, diaphoresis, EMS was called, pain started with a migraine headache but then started having chest pain after taking Fioricet, when seen in the emergency room she was not have a headache and was there for more for the other symptoms.  It appears as though she was also admitted back in January for Covid positive and migraine, with acute hypoxic respiratory failure, non-intractable migraine, acute kidney injury, chronic adrenal insufficiency on home hydrocortisone, hyperlipidemia, hypothyroidism, depression and previous CVA (or MI really unclear).  Migraines started at the age of 89 after bing it in the head. Here with her son who also provides much information. The older she gets the worse she gets. Headaches start in the thr front of the head, continuous headache. Never had a sleep test. Wakes with dry mouth in the morning. Wakes up with headaches every day. Headaches every day, continuous just waxes and wanes all day, worsening 6 months ago with intractable  headaches, no trauma, nothing maes it better or worse. Starts on the right side if a migraines or on the top, pulsating, pounding throbbing, light and sound and smell, significant nausea, vomiting, movement makes it worse, it is positional worse in the morning, can wake her up at night, she is also having vision changes in her right eye movement and changing positions make it worse. 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 Had heart surgery and a stroke at the age of 23. Migraines daily as well.  Headaches daily.  Continuous headache.  No medication overuse, denies.  No aura.  Ongoing for years but worsening over the last 6 months with vision changes, nocturnal and morning headaches, positional quality.  She is tried and failed multiple medications including preventatives in acute.  We discussed her options, feel an MRI of the brain is warranted, would not hesitate to start Botox and we will initiate CGRP. No other focal neurologic deficits, associated symptoms, inciting events or modifiable factors.  Reviewed notes, labs and imaging from outside physicians, which showed:    CT head 09/2019: IMPRESSION: Personally reviewed and agree with the following: 1. No acute intracranial abnormalities. 2. Significant mucosal thickening throughout the paranasal sinuses as above with no air-fluid levels.   Review of Systems: Patient complains of symptoms per HPI as well as the following symptoms: Headaches, fatigue, snoring, insomnia, migraines, vision changes. Pertinent negatives and positives per HPI. All others negative.   Social History   Socioeconomic  History  . Marital status: Married    Spouse name: Not on file  . Number of children: 2  . Years of education: Not on file  . Highest education level: Not on file  Occupational History  . Occupation: Disabled  Tobacco Use  . Smoking status: Never  Smoker  . Smokeless tobacco: Never Used  Vaping Use  . Vaping Use: Never used  Substance and Sexual Activity  . Alcohol use: No  . Drug use: No  . Sexual activity: Yes  Other Topics Concern  . Not on file  Social History Narrative   Lives with sister in law right now   Left handed   Caffeine: none    Social Determinants of Health   Financial Resource Strain:   . Difficulty of Paying Living Expenses: Not on file  Food Insecurity:   . Worried About Charity fundraiser in the Last Year: Not on file  . Ran Out of Food in the Last Year: Not on file  Transportation Needs:   . Lack of Transportation (Medical): Not on file  . Lack of Transportation (Non-Medical): Not on file  Physical Activity:   . Days of Exercise per Week: Not on file  . Minutes of Exercise per Session: Not on file  Stress:   . Feeling of Stress : Not on file  Social Connections:   . Frequency of Communication with Friends and Family: Not on file  . Frequency of Social Gatherings with Friends and Family: Not on file  . Attends Religious Services: Not on file  . Active Member of Clubs or Organizations: Not on file  . Attends Archivist Meetings: Not on file  . Marital Status: Not on file  Intimate Partner Violence:   . Fear of Current or Ex-Partner: Not on file  . Emotionally Abused: Not on file  . Physically Abused: Not on file  . Sexually Abused: Not on file    Family History  Problem Relation Age of Onset  . Colon cancer Sister 70  . Acromegaly Father   . Heart disease Father        Tumor in the heart  . Stomach cancer Father   . Diabetes Sister   . Kidney disease Sister        x2  . Heart disease Sister        Tumor in the heart  . Heart disease Daughter        Tumor in the heart  . Colon polyps Neg Hx   . Esophageal cancer Neg Hx   . Gallbladder disease Neg Hx   . Migraines Neg Hx     Past Medical History:  Diagnosis Date  . 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[place])   . Amblyopia   .  Anemia   . Arthritis    "in my lower back"  . Asthma   . Atrial myxoma   . Blindness of left eye 1978  . Blood transfusion   . Breast cyst   . Constipation   . 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 CVA (cerebral infarction)   . Depression   . Depression   . 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[place])   . Hemorrhoids   . Hyperlipemia   . Hypothyroidism    per office note dated 03/25/2014  . Migraine   . Peptic ulcer associated with Helicobacter pylori infection   . Reflux   . Sinus headache   . Stomach ulcer    "from the hydrocortisone"  . Stroke [facility]) 1978   "  piece tumor broke off & went to left eye; leaving me blind"  . Thyroid disease    pt unsure if hyper or hypo???    Patient Active Problem List   Diagnosis Date Noted  . Chronic migraine without aura, with intractable migraine, so stated, with status migrainosus 05/25/2020  . Frequent PVCs   . COVID-19 10/10/2019  . Hypoxia   . Migraine with aura and without status migrainosus, not intractable   . Tachycardia   . ARF (acute renal failure) (HCC) 12/06/2018  . Fever 12/06/2018  . Acute hypoxemic respiratory failure (HCC) 12/06/2018  . Headache 11/02/2013  . Dizziness 11/02/2013  . Anemia 01/10/2013  . Chest pain, unspecified 01/10/2013  . Adjustment disorder with mixed anxiety and depressed mood 01/10/2013  . Adrenal crisis (HCC) 01/07/2013  . Nausea & vomiting 08/03/2011  . Hypotension 08/03/2011  . Adrenal insufficiency (HCC) 08/03/2011  . Hyponatremia 08/03/2011  . Hypothyroidism 08/03/2011  . Depression 08/03/2011    Past Surgical History:  Procedure Laterality Date  . ADRENAL GLAND SURGERY  1982   on hydrocortisone  . ATRIAL MYXOMA EXCISION  1978  . BREAST BIOPSY  07/1999   ductal tissue excision & bx right; bx only left breast  . EYE SURGERY    . LEFT HEART CATHETERIZATION WITH CORONARY ANGIOGRAM N/A 10/06/2011   Procedure: LEFT HEART CATHETERIZATION WITH CORONARY ANGIOGRAM;  Surgeon: Robynn Pane, MD;  Location:  [facility] CATH LAB;  Service: Cardiovascular;  Laterality: N/A;  . repaired cross eyed     "as a child"  . ROTATOR CUFF REPAIR  05/2005   left  . VAGINAL HYSTERECTOMY  2000   "partial; w/left ovary"    Current Outpatient Medications  Medication Sig Dispense Refill  . aspirin EC 81 MG tablet Take 81 mg by mouth daily. Swallow whole.    . Cobalamin Combinations (NEURIVA PLUS PO) Take 1 capsule by mouth daily.    . fenofibrate (TRICOR) 48 MG tablet Take 1 tablet (48 mg total) by mouth daily. 90 tablet 3  . hydrocortisone (CORTEF) 20 MG tablet Take 0.5-1 tablets (10-20 mg total) by mouth 2 (two) times daily. Take 1 tablet every morning and 1/2 tablet every evening (Patient taking differently: Take 20 mg by mouth See admin instructions. Take 20 mg by mouth with a meal and 10 mg at bedtime) 45 tablet o  . levothyroxine (SYNTHROID) 75 MCG tablet Take 1 tablet (75 mcg total) by mouth daily. 30 tablet 2  . metoprolol tartrate (LOPRESSOR) 25 MG tablet Take 1 tablet (25 mg total) by mouth daily.    . Omega 3 1000 MG CAPS Take 1,000 mg by mouth in the morning and at bedtime.    Marland Kitchen omega-3 acid ethyl esters (LOVAZA) 1 g capsule Take 1 capsule (1 g total) by mouth 2 (two) times daily. 180 capsule 3  . rosuvastatin (CRESTOR) 10 MG tablet Take 10 mg by mouth daily.    Marland Kitchen venlafaxine XR (EFFEXOR-XR) 150 MG 24 hr capsule Take 1 capsule (150 mg total) by mouth daily. 30 capsule 2  . Galcanezumab-gnlm (EMGALITY) 120 MG/ML SOAJ Inject 120 mg into the skin every 30 (thirty) days. 1.12 mL 11  . Rimegepant Sulfate (NURTEC) 75 MG TBDP Take 75 mg by mouth daily as needed. For migraines. Take as close to onset of migraine as possible. One daily maximum. 8 tablet 6   No current facility-administered medications for this visit.    Allergies as of 05/25/2020 - Review Complete 05/25/2020  Allergen Reaction  Noted  . Aspirin Other (See Comments) 10/03/2011  . Ibuprofen [ibuprofen] Nausea And Vomiting 08/02/2011  . Penicillins  Nausea And Vomiting 06/02/2011    Vitals: BP 95/62 (BP Location: Right Arm, Patient Position: Sitting, Cuff Size: Large)   Pulse 65   Ht $R'4\' 11"'EU$  (1.499 m)   Wt 202 lb (91.6 kg)   BMI 40.80 kg/m  Last Weight:  Wt Readings from Last 1 Encounters:  05/25/20 202 lb (91.6 kg)   Last Height:   Ht Readings from Last 1 Encounters:  05/25/20 $RemoveB'4\' 11"'JXbioxMt$  (1.499 m)     Physical exam: Exam: Gen: NAD, conversant, well nourised, obese, well groomed                     CV: RRR, no MRG. No Carotid Bruits. No peripheral edema, warm, nontender Eyes: Conjunctivae clear without exudates or hemorrhage  Neuro: Detailed Neurologic Exam  Speech:    Speech is normal; fluent and spontaneous with normal comprehension.  Cognition:    The patient is oriented to person, place, and time;     recent and remote memory intact;     language fluent;     normal attention, concentration,     fund of knowledge Cranial Nerves:    The pupils are equal, round, and reactive to light. The fundi are normal and spontaneous venous pulsations are present. Visual fields are full to finger confrontation. Extraocular movements are intact. Trigeminal sensation is intact and the muscles of mastication are normal. The face is symmetric. The palate elevates in the midline. Hearing intact. Voice is normal. Shoulder shrug is normal. The tongue has normal motion without fasciculations.   Coordination:    Normal finger to nose and heel to shin. Normal rapid alternating movements.   Gait:    Heel-toe and tandem gait are normal.   Motor Observation:    No asymmetry, no atrophy, and no involuntary movements noted. Tone:    Normal muscle tone.    Posture:    Posture is normal. normal erect    Strength:    Strength is V/V in the upper and lower limbs.      Sensation: intact to LT     Reflex Exam:  DTR's:    Deep tendon reflexes in the upper and lower extremities are normal bilaterally.   Toes:    The toes are downgoing  bilaterally.   Clonus:    Clonus is absent.    Assessment/Plan:  64 year old with chronic migraines; 64 y.o. female here as requested by Tresa Garter, MD for migraines.  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  MRI brain due to concerning symptoms of morning headaches, positional headaches,vision changes, swelling right eye, pain right eye, vision changes right eye  to look for space occupying mass, chiari or intracranial hypertension (pseudotumor), demyelination, tumor, vasculitis or other causes of severe headache.  Bloodwork including ESR/CRP.  Sleep evaluation/study: Never had a sleep test. Wakes with dry mouth in the morning. Wakes up with headaches every day. Headaches every day, snoring, excessive daytime fatigue, memory loss.  Start [employer]. Would not hesitate to start Botox as well.  Nurtec acutely: Triptans contraindicated in this patient due to stroke and CAD/chest pain/coronary artery spasms necessitating nitro in the ED after Fioricet   Discussed: To prevent or relieve headaches,  try the following: Cool Compress. Lie down and place a cool compress on your head.  Avoid headache triggers. If certain foods or odors seem to have triggered your migraines in the past, avoid them. A headache diary might help you identify triggers.  Include physical activity in your daily routine. Try a daily walk or other moderate aerobic exercise.  Manage stress. Find healthy ways to cope with the stressors, such as delegating tasks on your to-do list.  Practice relaxation techniques. Try deep breathing, yoga, massage and visualization.  Eat regularly. Eating regularly scheduled meals and maintaining a healthy diet might help prevent headaches. Also, drink plenty of  fluids.  Follow a regular sleep schedule. Sleep deprivation might contribute to headaches Consider biofeedback. With this mind-body technique, you learn to control certain bodily functions -- such as muscle tension, heart rate and blood pressure -- to prevent headaches or reduce headache pain.    Proceed to emergency room if you experience new or worsening symptoms or symptoms do not resolve, if you have new neurologic symptoms or if headache is severe, or for any concerning symptom.   Provided education and documentation from American headache Society toolbox including articles on: chronic migraine medication overuse headache, chronic migraines, prevention of migraines, behavioral and other nonpharmacologic treatments for headache.    Orders Placed This Encounter  Procedures  . MR BRAIN W WO CONTRAST  . Basic Metabolic Panel  . Sedimentation rate  . C-reactive protein  . Ambulatory referral to Sleep Studies   Meds ordered this encounter  Medications  . Galcanezumab-gnlm (EMGALITY) 120 MG/ML SOAJ    Sig: Inject 120 mg into the skin every 30 (thirty) days.    Dispense:  1.12 mL    Refill:  11  . Rimegepant Sulfate (NURTEC) 75 MG TBDP    Sig: Take 75 mg by mouth daily as needed. For migraines. Take as close to onset of migraine as possible. One daily maximum.    Dispense:  8 tablet    Refill:  6    8 or max allowed by insurance. Triptans contraindicated in this patient due to stroke    Cc: Tresa Garter, MD,    Sarina Ill, MD  [facility] [address] [place], [place] [address]  Phone [phone] Fax [phone]

## 2020-05-25 ENCOUNTER — Ambulatory Visit: Payer: Medicaid Other | Admitting: Neurology

## 2020-05-25 ENCOUNTER — Encounter: Payer: Self-pay | Admitting: Neurology

## 2020-05-25 VITALS — BP 95/62 | HR 65 | Ht 59.0 in | Wt 202.0 lb

## 2020-05-25 DIAGNOSIS — G4719 Other hypersomnia: Secondary | ICD-10-CM

## 2020-05-25 DIAGNOSIS — G8929 Other chronic pain: Secondary | ICD-10-CM | POA: Diagnosis not present

## 2020-05-25 DIAGNOSIS — R0683 Snoring: Secondary | ICD-10-CM | POA: Diagnosis not present

## 2020-05-25 DIAGNOSIS — G43711 Chronic migraine without aura, intractable, with status migrainosus: Secondary | ICD-10-CM

## 2020-05-25 DIAGNOSIS — H5450 Low vision, one eye, unspecified eye: Secondary | ICD-10-CM | POA: Diagnosis not present

## 2020-05-25 DIAGNOSIS — H5461 Unqualified visual loss, right eye, normal vision left eye: Secondary | ICD-10-CM | POA: Diagnosis not present

## 2020-05-25 DIAGNOSIS — H5440 Blindness, one eye, unspecified eye: Secondary | ICD-10-CM

## 2020-05-25 DIAGNOSIS — R51 Headache with orthostatic component, not elsewhere classified: Secondary | ICD-10-CM

## 2020-05-25 DIAGNOSIS — R519 Headache, unspecified: Secondary | ICD-10-CM

## 2020-05-25 MED ORDER — EMGALITY 120 MG/ML ~~LOC~~ SOAJ: 120.0000 mg | SUBCUTANEOUS | 11 refills | Status: DC

## 2020-05-25 MED ORDER — NURTEC 75 MG PO TBDP: 75.0000 mg | ORAL_TABLET | Freq: Every day | ORAL | 6 refills | Status: DC | PRN

## 2020-05-25 NOTE — Patient Instructions (Signed)
Bloodowrk today Start [employer] Take Nurtec once daily as needed for headache MRI of the brain (will call) Sleep study (will call)  Rimegepant oral dissolving tablet What is this medicine? RIMEGEPANT (ri ME je pant) is used to treat migraine headaches with or without aura. An aura is a strange feeling or visual disturbance that warns you of an attack. It is not used to prevent migraines. This medicine may be used for other purposes; ask your health care provider or pharmacist if you have questions. COMMON BRAND NAME(S): NURTEC ODT What should I tell my health care provider before I take this medicine? They need to know if you have any of these conditions:  kidney disease  liver disease  an unusual or allergic reaction to rimegepant, other medicines, foods, dyes, or preservatives  pregnant or trying to get pregnant  breast-feeding How should I use this medicine? Take the medicine by mouth. Follow the directions on the prescription label. Leave the tablet in the sealed blister pack until you are ready to take it. With dry hands, open the blister and gently remove the tablet. If the tablet breaks or crumbles, throw it away and take a new tablet out of the blister pack. Place the tablet in the mouth and allow it to dissolve, and then swallow. Do not cut, crush, or chew this medicine. You do not need water to take this medicine. Talk to your pediatrician about the use of this medicine in children. Special care may be needed. Overdosage: If you think you have taken too much of this medicine contact a poison control center or emergency room at once. NOTE: This medicine is only for you. Do not share this medicine with others. What if I miss a dose? This does not apply. This medicine is not for regular use. What may interact with this medicine? This medicine may interact with the following medications:  certain medicines for fungal infections like fluconazole,  itraconazole  rifampin This list may not describe all possible interactions. Give your health care provider a list of all the medicines, herbs, non-prescription drugs, or dietary supplements you use. Also tell them if you smoke, drink alcohol, or use illegal drugs. Some items may interact with your medicine. What should I watch for while using this medicine? Visit your health care professional for regular checks on your progress. Tell your health care professional if your symptoms do not start to get better or if they get worse. What side effects may I notice from receiving this medicine? Side effects that you should report to your doctor or health care professional as soon as possible:  allergic reactions like skin rash, itching or hives; swelling of the face, lips, or tongue Side effects that usually do not require medical attention (report these to your doctor or health care professional if they continue or are bothersome):  nausea This list may not describe all possible side effects. Call your doctor for medical advice about side effects. You may report side effects to FDA at 1-800-FDA-1088. Where should I keep my medicine? Keep out of the reach of children. Store at room temperature between 15 and 30 degrees C (59 and 86 degrees F). Throw away any unused medicine after the expiration date. NOTE: This sheet is a summary. It may not cover all possible information. If you have questions about this medicine, talk to your doctor, pharmacist, or health care provider.  2020 Elsevier/Gold Standard (2018-11-11 00:21:31) Galcanezumab injection What is this medicine? GALCANEZUMAB (gal ka NEZ ue  mab) is used to prevent migraines and treat cluster headaches. This medicine may be used for other purposes; ask your health care provider or pharmacist if you have questions. COMMON BRAND NAME(S): Emgality What should I tell my health care provider before I take this medicine? They need to know if you  have any of these conditions:  an unusual or allergic reaction to galcanezumab, other medicines, foods, dyes, or preservatives  pregnant or trying to get pregnant  breast-feeding How should I use this medicine? This medicine is for injection under the skin. You will be taught how to prepare and give this medicine. Use exactly as directed. Take your medicine at regular intervals. Do not take your medicine more often than directed. It is important that you put your used needles and syringes in a special sharps container. Do not put them in a trash can. If you do not have a sharps container, call your pharmacist or healthcare provider to get one. Talk to your pediatrician regarding the use of this medicine in children. Special care may be needed. Overdosage: If you think you have taken too much of this medicine contact a poison control center or emergency room at once. NOTE: This medicine is only for you. Do not share this medicine with others. What if I miss a dose? If you miss a dose, take it as soon as you can. If it is almost time for your next dose, take only that dose. Do not take double or extra doses. What may interact with this medicine? Interactions are not expected. This list may not describe all possible interactions. Give your health care provider a list of all the medicines, herbs, non-prescription drugs, or dietary supplements you use. Also tell them if you smoke, drink alcohol, or use illegal drugs. Some items may interact with your medicine. What should I watch for while using this medicine? Tell your doctor or healthcare professional if your symptoms do not start to get better or if they get worse. What side effects may I notice from receiving this medicine? Side effects that you should report to your doctor or health care professional as soon as possible:  allergic reactions like skin rash, itching or hives, swelling of the face, lips, or tongue Side effects that usually do  not require medical attention (report these to your doctor or health care professional if they continue or are bothersome):  pain, redness, or irritation at site where injected This list may not describe all possible side effects. Call your doctor for medical advice about side effects. You may report side effects to FDA at 1-800-FDA-1088. Where should I keep my medicine? Keep out of the reach of children. You will be instructed on how to store this medicine. Throw away any unused medicine after the expiration date on the label. NOTE: This sheet is a summary. It may not cover all possible information. If you have questions about this medicine, talk to your doctor, pharmacist, or health care provider.  2020 Elsevier/Gold Standard (2018-02-13 12:03:23)

## 2020-05-26 ENCOUNTER — Telehealth: Payer: Self-pay | Admitting: Neurology

## 2020-05-26 LAB — BASIC METABOLIC PANEL
BUN/Creatinine Ratio: 16 (ref 12–28)
BUN: 14 mg/dL (ref 8–27)
CO2: 27 mmol/L (ref 20–29)
Calcium: 10.1 mg/dL (ref 8.7–10.3)
Chloride: 102 mmol/L (ref 96–106)
Creatinine, Ser: 0.87 mg/dL (ref 0.57–1.00)
GFR calc Af Amer: 83 mL/min/{1.73_m2} (ref >59)
GFR calc non Af Amer: 72 mL/min/{1.73_m2} (ref >59)
Glucose: 90 mg/dL (ref 65–99)
Potassium: 5 mmol/L (ref 3.5–5.2)
Sodium: 140 mmol/L (ref 134–144)

## 2020-05-26 LAB — C-REACTIVE PROTEIN: CRP: 3 mg/L (ref 0–10)

## 2020-05-26 LAB — SEDIMENTATION RATE: Sed Rate: 5 mm/hr (ref 0–40)

## 2020-05-26 NOTE — Telephone Encounter (Signed)
-----   Message from Melvenia Beam, MD sent at 05/26/2020 11:40 AM EDT ----- Labs are normal thanks

## 2020-05-26 NOTE — Telephone Encounter (Signed)
mcd healthy blue pending  

## 2020-05-26 NOTE — Telephone Encounter (Signed)
Called the patient and advised her of the normal lab result. She verbalized understanding and had no further questions.

## 2020-05-27 NOTE — Telephone Encounter (Signed)
Checked status it is still pending.  

## 2020-05-31 ENCOUNTER — Telehealth: Payer: Self-pay | Admitting: *Deleted

## 2020-05-31 NOTE — Telephone Encounter (Signed)
Emgality 120 mg PA completed on Cover My Meds. Key: [id number]. Awaiting determination from [facility] w/ Medicaid.

## 2020-05-31 NOTE — Telephone Encounter (Signed)
Completed Nurtec PA on Cover My Meds. Key: [id number]. Awaiting determination from [facility] w/ Medicaid.

## 2020-06-01 NOTE — Telephone Encounter (Signed)
mcd healthy blue [name]: [id number] (exp. 05/26/20 to 06/25/20) order sent to GI. They will reach out to the patient to schedule.

## 2020-06-02 ENCOUNTER — Other Ambulatory Visit: Payer: Self-pay | Admitting: Nurse Practitioner

## 2020-06-02 DIAGNOSIS — G43109 Migraine with aura, not intractable, without status migrainosus: Secondary | ICD-10-CM

## 2020-06-07 ENCOUNTER — Other Ambulatory Visit: Payer: Self-pay | Admitting: Nurse Practitioner

## 2020-06-07 DIAGNOSIS — G43109 Migraine with aura, not intractable, without status migrainosus: Secondary | ICD-10-CM

## 2020-06-07 NOTE — Telephone Encounter (Signed)
Please see refill request.

## 2020-06-10 NOTE — Telephone Encounter (Signed)
Per Cover My Meds, PA Case: [id number], Status: Approved, Coverage Starts on: 05/31/2020 12:00:00 AM, Coverage Ends on: 08/29/2020 12:00:00 AM.  I spoke with the pt's pharmacy and advised them of the approval. They stated the pt had already picked this up. They could not fill Nurtec d/t insurance denial.

## 2020-06-23 ENCOUNTER — Other Ambulatory Visit: Payer: Medicaid Other

## 2020-07-05 ENCOUNTER — Ambulatory Visit: Payer: Medicaid Other | Admitting: Nurse Practitioner

## 2020-07-07 NOTE — Telephone Encounter (Signed)
Nurtec appeal done on cover my meds. Key: [id number]. Awaiting healthy blue medicaid determination.

## 2020-07-07 NOTE — Telephone Encounter (Addendum)
We received a letter from [facility]. Nurtec has been denied due to patient having too many headaches per month. Dr Jaynee Eagles aware. Pt has follow up in the next week or so w/ NP.

## 2020-07-08 ENCOUNTER — Institutional Professional Consult (permissible substitution): Payer: Medicaid Other | Admitting: Neurology

## 2020-07-13 NOTE — Telephone Encounter (Signed)
I tried to reach the pt at [phone] and the phone rang several times but then I received a message saying call could not be completed. I called the pt's son Harrell Gave (on [place]) at [phone] and asked for pt to call me back. He will pass along the message to pt.

## 2020-07-13 NOTE — Telephone Encounter (Signed)
Cicely,RN@ [employer] has called asking for a consent letter stating pt has given consent for Dr Jaynee Eagles to appeal her Nurtec.  Cicely,RN states this is time sensitive and needs the letter is needed by 11-10 Cicely,RN can be called at [phone] fax#[phone] please reference Case ID#[id number]

## 2020-07-13 NOTE — Telephone Encounter (Signed)
Cicely RN called again today to ask for additional information such as pt's office note to support Nurtec appeal. She also told me that the patient only has to give verbal consent to provider to do appeal and that verbal consent can be obtained by the nurse. I advised I am waiting on the patient to call me back, then we can send in the additional information and documentation of pt's consent all at once. Lunette Stands stated the additional information needs to include the Case ID (listed below) and faxed to [phone].

## 2020-07-14 ENCOUNTER — Encounter: Payer: Self-pay | Admitting: *Deleted

## 2020-07-14 NOTE — Telephone Encounter (Signed)
Appeal letter written, signed by Dr Jaynee Eagles, and includes documentation of verbal consent from patient. Appeal letter and office note faxed to [facility] [place] appeals dept @ [phone]. Both office note and appeal letter include case ID of [id number]. Received a receipt of confirmation.

## 2020-07-14 NOTE — Telephone Encounter (Signed)
I spoke with the patient (confirmed name & DOB) and explained to her that Medicaid has denied the Nurtec but that we would like to appeal it with her verbal consent. The patient was happy to do this and gave me verbal consent for our office to appeal the Nurtec with Medicaid on her behalf. I let her know we would keep her informed when we hear back from insurance. Pt confirmed she has already started on the Emgality injections. She verbalized appreciation for the call.

## 2020-07-20 ENCOUNTER — Ambulatory Visit: Payer: Medicaid Other | Admitting: Adult Health

## 2020-07-27 ENCOUNTER — Other Ambulatory Visit: Payer: Self-pay | Admitting: Internal Medicine

## 2020-07-27 DIAGNOSIS — E781 Pure hyperglyceridemia: Secondary | ICD-10-CM

## 2020-08-06 ENCOUNTER — Other Ambulatory Visit: Payer: Self-pay | Admitting: Nurse Practitioner

## 2020-08-09 NOTE — Telephone Encounter (Signed)
Refill request

## 2020-08-23 ENCOUNTER — Other Ambulatory Visit: Payer: Self-pay | Admitting: Nurse Practitioner

## 2020-08-23 DIAGNOSIS — G43109 Migraine with aura, not intractable, without status migrainosus: Secondary | ICD-10-CM

## 2020-08-25 NOTE — Telephone Encounter (Signed)
I called [employer] PA dept at [phone] and LVM on secure line requesting call back for peer to peer. Left office number and hours in message.

## 2020-08-25 NOTE — Telephone Encounter (Signed)
I called [employer] PA dept due to receiving another Nurtec PA from pharmacy and not hearing back from [facility]. I was told that the appeal was denied. The only other option is a peer-to-peer and discuss clinical information which was already sent to the PA department. The number is [phone].

## 2020-08-30 NOTE — Telephone Encounter (Signed)
Received a call from [facility]. Unfortunately the peer to peer cannot be accepted because there is only a 30 day window. We were not aware of this since we did not previously receive a letter stating the appeal was denied. The suggestion was made to do a PA again. I was also told the appeal was denied because it states the patient has too many headaches per month.

## 2020-08-31 NOTE — Telephone Encounter (Signed)
I called the pt. She stated that since she started the [facility] she has not had any headaches. She did use the Nurtec samples and they worked for her. She doesn't have any left. Patient is very pleased with the injection. Nurtec PA completed. Key: [id number]. Awaiting determination from [facility]. Patient cannot use triptans when she has an acute migraine headache so we are requesting Nurtec.

## 2020-08-31 NOTE — Telephone Encounter (Addendum)
PA Case: [id number], Status: Approved, Coverage Starts on: 08/31/2020 12:00:00 AM, Coverage Ends on: 08/31/2021 12:00:00 AM. Faxed approval notice to [employer]. Received a receipt of confirmation. Also called pt and advised Nurtec approved.

## 2020-09-07 ENCOUNTER — Emergency Department (HOSPITAL_COMMUNITY): Payer: Medicaid Other

## 2020-09-07 ENCOUNTER — Emergency Department (HOSPITAL_COMMUNITY)
Admission: EM | Admit: 2020-09-07 | Discharge: 2020-09-07 | Disposition: A | Discharge status: Home / Self Care | Payer: Medicaid Other | Attending: Emergency Medicine | Admitting: Emergency Medicine

## 2020-09-07 ENCOUNTER — Encounter (HOSPITAL_COMMUNITY): Payer: Self-pay | Admitting: Emergency Medicine

## 2020-09-07 DIAGNOSIS — Z8616 Personal history of COVID-19: Secondary | ICD-10-CM | POA: Insufficient documentation

## 2020-09-07 DIAGNOSIS — E039 Hypothyroidism, unspecified: Secondary | ICD-10-CM | POA: Insufficient documentation

## 2020-09-07 DIAGNOSIS — I959 Hypotension, unspecified: Secondary | ICD-10-CM | POA: Diagnosis not present

## 2020-09-07 DIAGNOSIS — R000 Tachycardia, unspecified: Secondary | ICD-10-CM | POA: Insufficient documentation

## 2020-09-07 DIAGNOSIS — R001 Bradycardia, unspecified: Secondary | ICD-10-CM | POA: Diagnosis not present

## 2020-09-07 DIAGNOSIS — Z7982 Long term (current) use of aspirin: Secondary | ICD-10-CM | POA: Diagnosis not present

## 2020-09-07 DIAGNOSIS — U071 COVID-19: Secondary | ICD-10-CM | POA: Diagnosis not present

## 2020-09-07 DIAGNOSIS — Z79899 Other long term (current) drug therapy: Secondary | ICD-10-CM | POA: Insufficient documentation

## 2020-09-07 DIAGNOSIS — J45909 Unspecified asthma, uncomplicated: Secondary | ICD-10-CM | POA: Insufficient documentation

## 2020-09-07 DIAGNOSIS — R509 Fever, unspecified: Secondary | ICD-10-CM | POA: Diagnosis not present

## 2020-09-07 DIAGNOSIS — R059 Cough, unspecified: Secondary | ICD-10-CM | POA: Diagnosis not present

## 2020-09-07 DIAGNOSIS — R1111 Vomiting without nausea: Secondary | ICD-10-CM | POA: Diagnosis not present

## 2020-09-07 DIAGNOSIS — R0902 Hypoxemia: Secondary | ICD-10-CM | POA: Diagnosis not present

## 2020-09-07 LAB — COMPREHENSIVE METABOLIC PANEL
ALT: 21 U/L (ref 0–44)
AST: 28 U/L (ref 15–41)
Albumin: 4 g/dL (ref 3.5–5.0)
Alkaline Phosphatase: 54 U/L (ref 38–126)
Anion gap: 13 (ref 5–15)
BUN: 13 mg/dL (ref 8–23)
CO2: 22 mmol/L (ref 22–32)
Calcium: 9.5 mg/dL (ref 8.9–10.3)
Chloride: 100 mmol/L (ref 98–111)
Creatinine, Ser: 1.02 mg/dL — ABNORMAL HIGH (ref 0.44–1.00)
GFR, Estimated: >60 mL/min (ref >60)
Glucose, Bld: 104 mg/dL — ABNORMAL HIGH (ref 70–99)
Potassium: 3.8 mmol/L (ref 3.5–5.1)
Sodium: 135 mmol/L (ref 135–145)
Total Bilirubin: 0.5 mg/dL (ref 0.3–1.2)
Total Protein: 7.3 g/dL (ref 6.5–8.1)

## 2020-09-07 LAB — LACTIC ACID, PLASMA: Lactic Acid, Venous: 1.4 mmol/L (ref 0.5–1.9)

## 2020-09-07 LAB — CBC WITH DIFFERENTIAL/PLATELET
Abs Immature Granulocytes: 0.02 10*3/uL (ref 0.00–0.07)
Basophils Absolute: 0 10*3/uL (ref 0.0–0.1)
Basophils Relative: 1 %
Eosinophils Absolute: 0.3 10*3/uL (ref 0.0–0.5)
Eosinophils Relative: 5 %
HCT: 44.3 % (ref 36.0–46.0)
Hemoglobin: 14 g/dL (ref 12.0–15.0)
Immature Granulocytes: 0 %
Lymphocytes Relative: 22 %
Lymphs Abs: 1.3 10*3/uL (ref 0.7–4.0)
MCH: 29.1 pg (ref 26.0–34.0)
MCHC: 31.6 g/dL (ref 30.0–36.0)
MCV: 92.1 fL (ref 80.0–100.0)
Monocytes Absolute: 1 10*3/uL (ref 0.1–1.0)
Monocytes Relative: 16 %
Neutro Abs: 3.4 10*3/uL (ref 1.7–7.7)
Neutrophils Relative %: 56 %
Platelets: 302 10*3/uL (ref 150–400)
RBC: 4.81 MIL/uL (ref 3.87–5.11)
RDW: 14 % (ref 11.5–15.5)
WBC: 6 10*3/uL (ref 4.0–10.5)
nRBC: 0 % (ref 0.0–0.2)

## 2020-09-07 LAB — RESP PANEL BY RT-PCR (FLU A&B, COVID) ARPGX2
Influenza A by PCR: NEGATIVE
Influenza B by PCR: NEGATIVE
SARS Coronavirus 2 by RT PCR: POSITIVE — AB

## 2020-09-07 MED ORDER — ACETAMINOPHEN 325 MG PO TABS
650.0000 mg | ORAL_TABLET | Freq: Once | ORAL | Status: AC
  Administered 2020-09-07: 20:00:00 650 mg via ORAL
  Filled 2020-09-07: qty 2

## 2020-09-07 MED ORDER — ACETAMINOPHEN 325 MG PO TABS
650.0000 mg | ORAL_TABLET | Freq: Once | ORAL | Status: AC | PRN
  Administered 2020-09-07: 650 mg via ORAL
  Filled 2020-09-07: qty 2

## 2020-09-07 NOTE — Discharge Instructions (Addendum)
Home to quarantine. If you would like to discuss treatment with the antibody infusion, call the clinic to discuss. This is a time sensitive treatment and needs to be done before day 7 of symptoms. Take Tylenol as directed for fevers and body aches. Take Mucinex as needed as directed for symptom relief.

## 2020-09-07 NOTE — ED Provider Notes (Signed)
[facility] EMERGENCY DEPARTMENT Provider Note   CSN: [id number] Arrival date & time: 09/07/20  1405     History Chief Complaint  Patient presents with  . Fever  . Vomiting    [name] is a 64 y.o. female.  64 year old female presents with 2 days of cough, chest congestion, body aches, nausea, vomiting, fevers, chills. No known sick contacts, not vaccinated against [place].   [name] was evaluated in Emergency Department on 09/07/2020 for the symptoms described in the history of present illness. She was evaluated in the context of the global COVID-19 pandemic, which necessitated consideration that the patient might be at risk for infection with the SARS-CoV-2 virus that causes COVID-19. Institutional protocols and algorithms that pertain to the evaluation of patients at risk for COVID-19 are in a state of rapid change based on information released by regulatory bodies including the CDC and federal and state organizations. These policies and algorithms were followed during the patient's care in the ED.         Past Medical History:  Diagnosis Date  . 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 Amblyopia   . Anemia   . Arthritis    "in my lower back"  . Asthma   . Atrial myxoma   . Blindness of left eye 1978  . Blood transfusion   . Breast cyst   . Constipation   . 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 CVA (cerebral infarction)   . Depression   . Depression   . 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 Hemorrhoids   . Hyperlipemia   . Hypothyroidism    per office note dated 03/25/2014  . Migraine   . Peptic ulcer associated with Helicobacter pylori infection   . Reflux   . Sinus headache   . Stomach ulcer    "from the hydrocortisone"  . Stroke [facility]) 1978   "piece tumor broke off & went to left eye; leaving me blind"  . Thyroid disease    pt unsure if hyper or hypo???    Patient Active Problem List   Diagnosis Date Noted  . Chronic migraine  without aura, with intractable migraine, so stated, with status migrainosus 05/25/2020  . Frequent PVCs   . COVID-19 10/10/2019  . Hypoxia   . Migraine with aura and without status migrainosus, not intractable   . Tachycardia   . ARF (acute renal failure) ([place]) 12/06/2018  . Fever 12/06/2018  . Acute hypoxemic respiratory failure ([place]) 12/06/2018  . Headache 11/02/2013  . Dizziness 11/02/2013  . Anemia 01/10/2013  . Chest pain, unspecified 01/10/2013  . Adjustment disorder with mixed anxiety and depressed mood 01/10/2013  . Adrenal crisis ([place]) 01/07/2013  . Nausea & vomiting 08/03/2011  . Hypotension 08/03/2011  . Adrenal insufficiency ([place]) 08/03/2011  . Hyponatremia 08/03/2011  . Hypothyroidism 08/03/2011  . Depression 08/03/2011    Past Surgical History:  Procedure Laterality Date  . ADRENAL GLAND SURGERY  1982   on hydrocortisone  . ATRIAL MYXOMA EXCISION  1978  . BREAST BIOPSY  07/1999   ductal tissue excision & bx right; bx only left breast  . EYE SURGERY    . LEFT HEART CATHETERIZATION WITH CORONARY ANGIOGRAM N/A 10/06/2011   Procedure: LEFT HEART CATHETERIZATION WITH CORONARY ANGIOGRAM;  Surgeon: Clent Demark, MD;  Location: [facility] CATH LAB;  Service: Cardiovascular;  Laterality: N/A;  . repaired cross eyed     "as a child"  . ROTATOR CUFF REPAIR  05/2005   left  . VAGINAL HYSTERECTOMY  2000   "partial; w/left ovary"     OB History   No obstetric history on file.     Family History  Problem Relation Age of Onset  . Colon cancer Sister 35  . Acromegaly Father   . Heart disease Father        Tumor in the heart  . Stomach cancer Father   . Diabetes Sister   . Kidney disease Sister        x2  . Heart disease Sister        Tumor in the heart  . Heart disease Daughter        Tumor in the heart  . Colon polyps Neg Hx   . Esophageal cancer Neg Hx   . Gallbladder disease Neg Hx   . Migraines Neg Hx     Social History   Tobacco Use  . Smoking status:  Never Smoker  . Smokeless tobacco: Never Used  Vaping Use  . Vaping Use: Never used  Substance Use Topics  . Alcohol use: No  . Drug use: No    Home Medications Prior to Admission medications   Medication Sig Start Date End Date Taking? Authorizing Provider  aspirin EC 81 MG tablet Take 81 mg by mouth daily. Swallow whole.    [provider]  Cobalamin Combinations (NEURIVA PLUS PO) Take 1 capsule by mouth daily.    [provider]  fenofibrate (TRICOR) 48 MG tablet Take 1 tablet (48 mg total) by mouth daily. 07/28/20   Vevelyn Francois, NP  Galcanezumab-gnlm (EMGALITY) 120 MG/ML SOAJ Inject 120 mg into the skin every 30 (thirty) days. 05/25/20   Melvenia Beam, MD  hydrocortisone (CORTEF) 20 MG tablet Take 0.5-1 tablets (10-20 mg total) by mouth 2 (two) times daily. Take 1 tablet every morning and 1/2 tablet every evening Patient taking differently: Take 20 mg by mouth See admin instructions. Take 20 mg by mouth with a meal and 10 mg at bedtime [id number]   Delora Fuel, MD  levothyroxine (SYNTHROID) 75 MCG tablet Take 1 tablet (75 mcg total) by mouth daily. 08/09/20 11/07/20  Vevelyn Francois, NP  metoprolol tartrate (LOPRESSOR) 25 MG tablet Take 1 tablet (25 mg total) by mouth daily. 12/09/18   Danford, Suann Larry, MD  Omega 3 1000 MG CAPS Take 1,000 mg by mouth in the morning and at bedtime.    [provider]  omega-3 acid ethyl esters (LOVAZA) 1 g capsule Take 1 capsule (1 g total) by mouth 2 (two) times daily. 07/18/19   Tresa Garter, MD  Rimegepant Sulfate (NURTEC) 75 MG TBDP Take 75 mg by mouth daily as needed. For migraines. Take as close to onset of migraine as possible. One daily maximum. 05/25/20   Melvenia Beam, MD  rosuvastatin (CRESTOR) 10 MG tablet Take 10 mg by mouth daily. 09/24/18   [provider]  venlafaxine XR (EFFEXOR-XR) 150 MG 24 hr capsule Take 1 capsule (150 mg total) by mouth daily. 09/27/18   Lanae Boast, FNP     Allergies    Aspirin, Ibuprofen [ibuprofen], and Penicillins  Review of Systems   Review of Systems  Constitutional: Positive for chills and fever.  HENT: Positive for congestion.   Respiratory: Positive for cough. Negative for shortness of breath.   Cardiovascular: Negative for chest pain.  Gastrointestinal: Positive for nausea and vomiting. Negative for abdominal pain, constipation and diarrhea.  Genitourinary: Negative for  dysuria.  Musculoskeletal: Positive for arthralgias and myalgias.  Skin: Negative for rash and wound.  Neurological: Positive for weakness.  Hematological: Negative for adenopathy.  Psychiatric/Behavioral: Negative for confusion.  All other systems reviewed and are negative.   Physical Exam Updated Vital Signs BP (!) 109/58   Pulse (!) 120   Temp (!) 100.8 F (38.2 C) (Oral) Comment: RN notified  Resp 18   SpO2 90%   Physical Exam Vitals and nursing note reviewed.  Constitutional:      General: She is not in acute distress.    Appearance: She is well-developed and well-nourished. She is not diaphoretic.  HENT:     Head: Normocephalic and atraumatic.     Mouth/Throat:     Mouth: Mucous membranes are moist.  Cardiovascular:     Rate and Rhythm: Regular rhythm. Tachycardia present.     Pulses: Normal pulses.     Heart sounds: Normal heart sounds.  Pulmonary:     Effort: Pulmonary effort is normal.     Breath sounds: Normal breath sounds.  Abdominal:     Palpations: Abdomen is soft.     Tenderness: There is no abdominal tenderness.  Musculoskeletal:     Cervical back: Neck supple.  Lymphadenopathy:     Cervical: No cervical adenopathy.  Skin:    General: Skin is warm and dry.     Findings: No erythema or rash.  Neurological:     Mental Status: She is alert and oriented to person, place, and time.  Psychiatric:        Mood and Affect: Mood and affect normal.        Behavior: Behavior normal.     ED Results / Procedures / Treatments    Labs (all labs ordered are listed, but only abnormal results are displayed) Labs Reviewed  RESP PANEL BY RT-PCR (FLU A&B, COVID) ARPGX2 - Abnormal; Notable for the following components:      Result Value   SARS Coronavirus 2 by RT PCR POSITIVE (*)    All other components within normal limits  COMPREHENSIVE METABOLIC PANEL - Abnormal; Notable for the following components:   Glucose, Bld 104 (*)    Creatinine, Ser 1.02 (*)    All other components within normal limits  LACTIC ACID, PLASMA  CBC WITH DIFFERENTIAL/PLATELET  URINALYSIS, ROUTINE W REFLEX MICROSCOPIC    EKG EKG Interpretation  Date/Time:  Tuesday September 07 2020 19:53:47 EST Ventricular Rate:  116 PR Interval:    QRS Duration: 81 QT Interval:  295 QTC Calculation: 410 R Axis:   65 Text Interpretation: Sinus tachycardia Ventricular premature complex Aberrant complex Borderline T abnormalities, anterior leads SINCE LAST TRACING HEART RATE HAS INCREASED Confirmed by Malvin Johns [phone]) on 09/07/2020 8:01:07 PM   Radiology DG Chest 2 View  Result Date: 09/07/2020 CLINICAL DATA:  Cough and fever. EXAM: CHEST - 2 VIEW COMPARISON:  Single-view of the chest 03/26/2020. PA and lateral chest 04/27/2017. FINDINGS: Epicardial pacing leads and median sternotomy wires are unchanged. Lung volumes are somewhat low but the lungs are clear. Heart size is normal. No pneumothorax or pleural fluid. No acute or focal bony abnormality. Multiple surgical clips in the upper abdomen noted. IMPRESSION: No acute disease. Electronically Signed   By: Inge Rise M.D.   On: 09/07/2020 15:17    Procedures Procedures (including critical care time)  Medications Ordered in ED Medications  acetaminophen (TYLENOL) tablet 650 mg (650 mg Oral Given 09/07/20 1427)  acetaminophen (TYLENOL) tablet 650 mg (650 mg  Oral Given 09/07/20 1954)    ED Course  I have reviewed the triage vital signs and the nursing notes.  Pertinent labs & imaging  results that were available during my care of the patient were reviewed by me and considered in my medical decision making (see chart for details).  Clinical Course as of 09/07/20 2106  Tue Sep 07, 2020  1918 64 yo female with COVID like symptoms as above, on exam, appears to feel unwell and is generally uncooperative with exam. Patient is tachycardic around 110, febrile with temp of 101 oral at time of exam. She was given Tylenol on arrival 5 hours ago for a temp of 103.3. Labs reassuring with normal CBC, CMP, lactic acid. Patient is positive for COVID, negative for flu, CXR unremarkable. Plan is to give second dose of tylenol and discharge to follow up with PCP. Will refer to MAB clinic to discuss antibody infusion.  [LM]    Clinical Course User Index [LM] Alden Hipp   MDM Rules/Calculators/A&P                          Final Clinical Impression(s) / ED Diagnoses Final diagnoses:  COVID-19    Rx / DC Orders ED Discharge Orders    None       Jeannie Fend, PA-C 09/07/20 2106    Rolan Bucco, MD 09/07/20 2308

## 2020-09-07 NOTE — ED Triage Notes (Signed)
Pt arrives via [facility] with c/o fever, chills, fatigue. Pt also having bigeminy on  Monitor, received 250cc NS, 4mg  zofran en route. Temp 101. cbg 103. HR 110.

## 2020-09-08 ENCOUNTER — Other Ambulatory Visit: Payer: Self-pay | Admitting: Nurse Practitioner

## 2020-09-08 ENCOUNTER — Telehealth: Payer: Self-pay | Admitting: Nurse Practitioner

## 2020-09-08 ENCOUNTER — Telehealth: Payer: Self-pay

## 2020-09-08 DIAGNOSIS — U071 COVID-19: Secondary | ICD-10-CM

## 2020-09-08 NOTE — Progress Notes (Signed)
I connected by phone with patient to discuss the potential use of a new treatment for mild to moderate COVID-19 viral infection in non-hospitalized patients.   This patient is a that meets the FDA criteria for Emergency Use Authorization of COVID monoclonal antibody  1. Has a (+) direct SARS-CoV-2 viral test result 2. Has mild or moderate COVID-19  3. Is NOT hospitalized due to COVID-19 4. Is within 7 days of symptom onset 5. Has at least one of the high risk factor(s) for progression to severe COVID-19 and/or hospitalization as defined in EUA. ? Specific high risk criteria : HTN, cardiovascular disease, history of embolic stroke, unvaccinated     I have spoken and communicated the following to the patient or parent/caregiver regarding COVID monoclonal antibody treatment:   1. FDA has authorized the emergency use for the treatment of high risk post-exposure prophylaxis for COVID19.    2. The significant known and potential risks and benefits of COVID monoclonal antibody, and the extent to which such potential risks and benefits are unknown.    3. Patients treated with COVID monoclonal antibody should continue to      self-isolate and use infection control measures (e.g., wear mask, isolate, social distance, avoid sharing personal items, clean and disinfect "high touch" surfaces, and frequent handwashing) according to CDC guidelines.     After reviewing this information with the patient, The patient agreed to proceed with receiving monoclonal antibody infusion and will be provided a copy of the Fact sheet prior to receiving the infusion. Ocie Bob, NP

## 2020-09-08 NOTE — Telephone Encounter (Signed)
Message left on patient's voicemail today to contact office with concerns.

## 2020-09-08 NOTE — Telephone Encounter (Signed)
Transition Care Management Unsuccessful Follow-up Telephone Call  Date of discharge and from where:  09/07/2020 [facility]  Attempts:  1st Attempt  Reason for unsuccessful TCM follow-up call:  Left voice message

## 2020-09-08 NOTE — Telephone Encounter (Signed)
Son Cristal Deer [phone] called with pt present to urgently schedule MAB infusions per ED discharge instructions. Given infusion center ph#. Sent message to infusion center to expect call for scheduling.

## 2020-09-08 NOTE — Telephone Encounter (Signed)
Transition Care Management Follow-up Telephone Call Date of discharge and from where: 09/07/2020 [facility]  How have you been since you were released from the hospital? Still having the same symptoms.   Any questions or concerns? No  Items Reviewed:  Did the pt receive and understand the discharge instructions provided? Yes   Medications obtained and verified? No medications were given.   Other? No   Any new allergies since your discharge? No   Dietary orders reviewed? Yes  Do you have support at home? Yes    Functional Questionnaire: (I = Independent and D = Dependent) ADLs: I  Bathing/Dressing- I  Meal Prep- I  Eating- I  Maintaining continence- I  Transferring/Ambulation- I  Managing Meds- I  Follow up appointments reviewed:   PCP Hospital f/u appt confirmed? No  Patient will follow up with PCP after quarantine.   Specialist Hospital f/u appt confirmed? No    Are transportation arrangements needed? No   If their condition worsens, is the pt aware to call PCP or go to the Emergency Dept.? Yes  Was the patient provided with contact information for the PCP's office or ED? Yes  Was to pt encouraged to call back with questions or concerns? Yes  Patient was provided with Monoclonal Antibody Infusion hotline number.

## 2020-09-09 ENCOUNTER — Ambulatory Visit (HOSPITAL_COMMUNITY)
Admission: RE | Admit: 2020-09-09 | Discharge: 2020-09-09 | Disposition: A | Discharge status: Home / Self Care | Payer: Medicaid Other | Source: Ambulatory Visit | Attending: Pulmonary Disease | Admitting: Pulmonary Disease

## 2020-09-09 DIAGNOSIS — U071 COVID-19: Secondary | ICD-10-CM | POA: Insufficient documentation

## 2020-09-09 MED ORDER — EPINEPHRINE 0.3 MG/0.3ML IJ SOAJ: 0.3000 mg | Freq: Once | INTRAMUSCULAR | Status: DC | PRN

## 2020-09-09 MED ORDER — METHYLPREDNISOLONE SODIUM SUCC 125 MG IJ SOLR: 125.0000 mg | Freq: Once | INTRAMUSCULAR | Status: DC | PRN

## 2020-09-09 MED ORDER — FAMOTIDINE IN NACL 20-0.9 MG/50ML-% IV SOLN: 20.0000 mg | Freq: Once | INTRAVENOUS | Status: DC | PRN

## 2020-09-09 MED ORDER — SODIUM CHLORIDE 0.9 % IV SOLN: Freq: Once | INTRAVENOUS | Status: AC

## 2020-09-09 MED ORDER — ALBUTEROL SULFATE HFA 108 (90 BASE) MCG/ACT IN AERS: 2.0000 | INHALATION_SPRAY | Freq: Once | RESPIRATORY_TRACT | Status: DC | PRN

## 2020-09-09 MED ORDER — DIPHENHYDRAMINE HCL 50 MG/ML IJ SOLN: 50.0000 mg | Freq: Once | INTRAMUSCULAR | Status: DC | PRN

## 2020-09-09 MED ORDER — SODIUM CHLORIDE 0.9 % IV SOLN: INTRAVENOUS | Status: DC | PRN

## 2020-09-09 NOTE — Progress Notes (Signed)
Patient reviewed Fact Sheet for Patients, Parents, and Caregivers for Emergency Use Authorization (EUA) of bamlanivimab and etesevimab for the Treatment of Coronavirus. Patient also reviewed and is agreeable to the estimated cost of treatment. Patient is agreeable to proceed.   

## 2020-09-09 NOTE — Progress Notes (Signed)
  Diagnosis: COVID-19  Physician:dr wright  Procedure: Covid Infusion Clinic Med: casirivimab\imdevimab infusion - Provided patient with casirivimab\imdevimab fact sheet for patients, parents and caregivers prior to infusion.  Complications: No immediate complications noted.  Discharge: Discharged home   [name] S Cristoval Teall 09/09/2020

## 2020-09-09 NOTE — Discharge Instructions (Signed)
COVID-19 COVID-19 is a respiratory infection that is caused by a virus called severe acute respiratory syndrome coronavirus 2 (SARS-CoV-2). The disease is also known as coronavirus disease or novel coronavirus. In some people, the virus may not cause any symptoms. In others, it may cause a serious infection. The infection can get worse quickly and can lead to complications, such as:  Pneumonia, or infection of the lungs.  Acute respiratory distress syndrome or ARDS. This is a condition in which fluid build-up in the lungs prevents the lungs from filling with air and passing oxygen into the blood.  Acute respiratory failure. This is a condition in which there is not enough oxygen passing from the lungs to the body or when carbon dioxide is not passing from the lungs out of the body.  Sepsis or septic shock. This is a serious bodily reaction to an infection.  Blood clotting problems.  Secondary infections due to bacteria or fungus.  Organ failure. This is when your body's organs stop working. The virus that causes COVID-19 is contagious. This means that it can spread from person to person through droplets from coughs and sneezes (respiratory secretions). What are the causes? This illness is caused by a virus. You may catch the virus by:  Breathing in droplets from an infected person. Droplets can be spread by a person breathing, speaking, singing, coughing, or sneezing.  Touching something, like a table or a doorknob, that was exposed to the virus (contaminated) and then touching your mouth, nose, or eyes. What increases the risk? Risk for infection You are more likely to be infected with this virus if you:  Are within 6 feet (2 meters) of a person with COVID-19.  Provide care for or live with a person who is infected with COVID-19.  Spend time in crowded indoor spaces or live in shared housing. Risk for serious illness You are more likely to become seriously ill from the virus if  you:  Are 50 years of age or older. The higher your age, the more you are at risk for serious illness.  Live in a nursing home or long-term care facility.  Have cancer.  Have a long-term (chronic) disease such as: ? Chronic lung disease, including chronic obstructive pulmonary disease or asthma. ? A long-term disease that lowers your body's ability to fight infection (immunocompromised). ? Heart disease, including heart failure, a condition in which the arteries that lead to the heart become narrow or blocked (coronary artery disease), a disease which makes the heart muscle thick, weak, or stiff (cardiomyopathy). ? Diabetes. ? Chronic kidney disease. ? Sickle cell disease, a condition in which red blood cells have an abnormal "sickle" shape. ? Liver disease.  Are obese. What are the signs or symptoms? Symptoms of this condition can range from mild to severe. Symptoms may appear any time from 2 to 14 days after being exposed to the virus. They include:  A fever or chills.  A cough.  Difficulty breathing.  Headaches, body aches, or muscle aches.  Runny or stuffy (congested) nose.  A sore throat.  New loss of taste or smell. Some people may also have stomach problems, such as nausea, vomiting, or diarrhea. Other people may not have any symptoms of COVID-19. How is this diagnosed? This condition may be diagnosed based on:  Your signs and symptoms, especially if: ? You live in an area with a COVID-19 outbreak. ? You recently traveled to or from an area where the virus is common. ? You   provide care for or live with a person who was diagnosed with COVID-19. ? You were exposed to a person who was diagnosed with COVID-19.  A physical exam.  Lab tests, which may include: ? Taking a sample of fluid from the back of your nose and throat (nasopharyngeal fluid), your nose, or your throat using a swab. ? A sample of mucus from your lungs (sputum). ? Blood tests.  Imaging tests,  which may include, X-rays, CT scan, or ultrasound. How is this treated? At present, there is no medicine to treat COVID-19. Medicines that treat other diseases are being used on a trial basis to see if they are effective against COVID-19. Your health care provider will talk with you about ways to treat your symptoms. For most people, the infection is mild and can be managed at home with rest, fluids, and over-the-counter medicines. Treatment for a serious infection usually takes places in a hospital intensive care unit (ICU). It may include one or more of the following treatments. These treatments are given until your symptoms improve.  Receiving fluids and medicines through an IV.  Supplemental oxygen. Extra oxygen is given through a tube in the nose, a face mask, or a hood.  Positioning you to lie on your stomach (prone position). This makes it easier for oxygen to get into the lungs.  Continuous positive airway pressure (CPAP) or bi-level positive airway pressure (BPAP) machine. This treatment uses mild air pressure to keep the airways open. A tube that is connected to a motor delivers oxygen to the body.  Ventilator. This treatment moves air into and out of the lungs by using a tube that is placed in your windpipe.  Tracheostomy. This is a procedure to create a hole in the neck so that a breathing tube can be inserted.  Extracorporeal membrane oxygenation (ECMO). This procedure gives the lungs a chance to recover by taking over the functions of the heart and lungs. It supplies oxygen to the body and removes carbon dioxide. Follow these instructions at home: Lifestyle  If you are sick, stay home except to get medical care. Your health care provider will tell you how long to stay home. Call your health care provider before you go for medical care.  Rest at home as told by your health care provider.  Do not use any products that contain nicotine or tobacco, such as cigarettes,  e-cigarettes, and chewing tobacco. If you need help quitting, ask your health care provider.  Return to your normal activities as told by your health care provider. Ask your health care provider what activities are safe for you. General instructions  Take over-the-counter and prescription medicines only as told by your health care provider.  Drink enough fluid to keep your urine pale yellow.  Keep all follow-up visits as told by your health care provider. This is important. How is this prevented?  There is no vaccine to help prevent COVID-19 infection. However, there are steps you can take to protect yourself and others from this virus. To protect yourself:   Do not travel to areas where COVID-19 is a risk. The areas where COVID-19 is reported change often. To identify high-risk areas and travel restrictions, check the CDC travel website: wwwnc.cdc.gov/travel/notices  If you live in, or must travel to, an area where COVID-19 is a risk, take precautions to avoid infection. ? Stay away from people who are sick. ? Wash your hands often with soap and water for 20 seconds. If soap and water   are not available, use an alcohol-based hand sanitizer. ? Avoid touching your mouth, face, eyes, or nose. ? Avoid going out in public, follow guidance from your state and local health authorities. ? If you must go out in public, wear a cloth face covering or face mask. Make sure your mask covers your nose and mouth. ? Avoid crowded indoor spaces. Stay at least 6 feet (2 meters) away from others. ? Disinfect objects and surfaces that are frequently touched every day. This may include:  Counters and tables.  Doorknobs and light switches.  Sinks and faucets.  Electronics, such as phones, remote controls, keyboards, computers, and tablets. To protect others: If you have symptoms of COVID-19, take steps to prevent the virus from spreading to others.  If you think you have a COVID-19 infection, contact  your health care provider right away. Tell your health care team that you think you may have a COVID-19 infection.  Stay home. Leave your house only to seek medical care. Do not use public transport.  Do not travel while you are sick.  Wash your hands often with soap and water for 20 seconds. If soap and water are not available, use alcohol-based hand sanitizer.  Stay away from other members of your household. Let healthy household members care for children and pets, if possible. If you have to care for children or pets, wash your hands often and wear a mask. If possible, stay in your own room, separate from others. Use a different bathroom.  Make sure that all people in your household wash their hands well and often.  Cough or sneeze into a tissue or your sleeve or elbow. Do not cough or sneeze into your hand or into the air.  Wear a cloth face covering or face mask. Make sure your mask covers your nose and mouth. Where to find more information  Centers for Disease Control and Prevention: www.cdc.gov/coronavirus/2019-ncov/index.html  World Health Organization: www.who.int/health-topics/coronavirus Contact a health care provider if:  You live in or have traveled to an area where COVID-19 is a risk and you have symptoms of the infection.  You have had contact with someone who has COVID-19 and you have symptoms of the infection. Get help right away if:  You have trouble breathing.  You have pain or pressure in your chest.  You have confusion.  You have bluish lips and fingernails.  You have difficulty waking from sleep.  You have symptoms that get worse. These symptoms may represent a serious problem that is an emergency. Do not wait to see if the symptoms will go away. Get medical help right away. Call your local emergency services (911 in the [place] Do not drive yourself to the hospital. Let the emergency medical personnel know if you think you have  COVID-19. Summary  COVID-19 is a respiratory infection that is caused by a virus. It is also known as coronavirus disease or novel coronavirus. It can cause serious infections, such as pneumonia, acute respiratory distress syndrome, acute respiratory failure, or sepsis.  The virus that causes COVID-19 is contagious. This means that it can spread from person to person through droplets from breathing, speaking, singing, coughing, or sneezing.  You are more likely to develop a serious illness if you are 50 years of age or older, have a weak immune system, live in a nursing home, or have chronic disease.  There is no medicine to treat COVID-19. Your health care provider will talk with you about ways to treat your symptoms.    Take steps to protect yourself and others from infection. Wash your hands often and disinfect objects and surfaces that are frequently touched every day. Stay away from people who are sick and wear a mask if you are sick. This information is not intended to replace advice given to you by your health care provider. Make sure you discuss any questions you have with your health care provider. Document Revised: 06/27/2019 Document Reviewed: 10/03/2018 Elsevier Patient Education  2020 [employer]. What types of side effects do monoclonal antibody drugs cause?  Common side effects  In general, the more common side effects caused by monoclonal antibody drugs include: . Allergic reactions, such as hives or itching . Flu-like signs and symptoms, including chills, fatigue, fever, and muscle aches and pains . Nausea, vomiting . Diarrhea . Skin rashes . Low blood pressure   The CDC is recommending patients who receive monoclonal antibody treatments wait at least 90 days before being vaccinated.  Currently, there are no data on the safety and efficacy of mRNA COVID-19 vaccines in persons who received monoclonal antibodies or convalescent plasma as part of COVID-19 treatment. Based  on the estimated half-life of such therapies as well as evidence suggesting that reinfection is uncommon in the 90 days after initial infection, vaccination should be deferred for at least 90 days, as a precautionary measure until additional information becomes available, to avoid interference of the antibody treatment with vaccine-induced immune responses. If you have any questions or concerns after the infusion please call the [facility] Provider on call at [phone]. This number is ONLY intended for your use regarding questions or concerns about the infusion post-treatment side-effects.  Please do not provide this number to others for use. For return to work notes please contact your primary care provider.   If someone you know is interested in receiving treatment please have them call the COVID hotline at [phone].

## 2020-09-17 ENCOUNTER — Telehealth: Payer: Medicaid Other | Admitting: Nurse Practitioner

## 2020-09-17 NOTE — Telephone Encounter (Signed)
[facility] called pt [phone] for HFU & scheduling appt at clinic [phone]). LVM for return call.

## 2020-09-21 NOTE — Telephone Encounter (Signed)
Completed renewal PA on Cover My Meds. Key: [id number]. Approved right away. PA Case: [id number], Status: Approved, Coverage Starts on: 09/21/2020 12:00:00 AM, Coverage Ends on: 09/21/2021 12:00:00 AM. Faxed notice to pharmacy. Received a receipt of confirmation.

## 2020-09-24 ENCOUNTER — Other Ambulatory Visit: Payer: Self-pay | Admitting: Internal Medicine

## 2020-09-24 DIAGNOSIS — E781 Pure hyperglyceridemia: Secondary | ICD-10-CM

## 2020-09-24 NOTE — Telephone Encounter (Signed)
Refill that came to the [facility].   [facility] does not do refills for this provider.

## 2020-11-01 ENCOUNTER — Other Ambulatory Visit: Payer: Self-pay | Admitting: Nurse Practitioner

## 2020-11-01 NOTE — Telephone Encounter (Signed)
Please see refill request.

## 2020-11-02 DIAGNOSIS — Z0271 Encounter for disability determination: Secondary | ICD-10-CM

## 2020-11-03 DIAGNOSIS — E785 Hyperlipidemia, unspecified: Secondary | ICD-10-CM | POA: Diagnosis not present

## 2020-11-03 DIAGNOSIS — I208 Other forms of angina pectoris: Secondary | ICD-10-CM | POA: Diagnosis not present

## 2020-11-03 DIAGNOSIS — I10 Essential (primary) hypertension: Secondary | ICD-10-CM | POA: Diagnosis not present

## 2020-11-08 ENCOUNTER — Encounter: Payer: Self-pay | Admitting: Nurse Practitioner

## 2020-11-08 ENCOUNTER — Telehealth (INDEPENDENT_AMBULATORY_CARE_PROVIDER_SITE_OTHER): Payer: Medicaid Other | Admitting: Nurse Practitioner

## 2020-11-08 DIAGNOSIS — H5789 Other specified disorders of eye and adnexa: Secondary | ICD-10-CM | POA: Diagnosis not present

## 2020-11-08 DIAGNOSIS — R000 Tachycardia, unspecified: Secondary | ICD-10-CM | POA: Diagnosis not present

## 2020-11-08 DIAGNOSIS — F4323 Adjustment disorder with mixed anxiety and depressed mood: Secondary | ICD-10-CM | POA: Diagnosis not present

## 2020-11-08 DIAGNOSIS — E039 Hypothyroidism, unspecified: Secondary | ICD-10-CM

## 2020-11-08 DIAGNOSIS — E785 Hyperlipidemia, unspecified: Secondary | ICD-10-CM | POA: Diagnosis not present

## 2020-11-08 DIAGNOSIS — G4700 Insomnia, unspecified: Secondary | ICD-10-CM

## 2020-11-08 MED ORDER — MELATONIN 10 MG PO TABS: 1.0000 | ORAL_TABLET | Freq: Every day | ORAL | 3 refills | Status: AC

## 2020-11-08 NOTE — Progress Notes (Signed)
   [address], [place]  [address] Phone:  [phone]   Fax:  [phone] Virtual Visit via Telephone Note  I connected with [name] on 11/08/20 at  3:20 PM EST by telephone and verified that I am speaking with the correct person using two identifiers.   I discussed the limitations, risks, security and privacy concerns of performing an evaluation and management service by telephone and the availability of in person appointments. I also discussed with the patient that there may be a patient responsible charge related to this service. The patient expressed understanding and agreed to proceed.  Patient home Provider Office  History of Present Illness:  Subjective:     [name] is a 65 y.o. female who complains of insomnia. Onset was several months ago. Patient describes symptoms as difficulty falling asleep. Paient has not attempted self treatment. Associated symptoms include: depression and frequent nighttime urination this is controlled. Patient denies anxiety, fatigue, leg cramps, restless legs and snoring. Symptoms have stabilized.   Review of Systems  Eyes:       Swelling in right eye since COVID last year [place] any vision changes Left eye complete blindness  Neurological: Positive for headaches (migraines).       Sleep problems     Observations/Objective: Virtual phone visit no exam   Assessment and Plan: Assessment  Primary Diagnosis & Pertinent Problem List: The primary encounter diagnosis was Insomnia, unspecified type. Diagnoses of Hypothyroidism, unspecified type, Tachycardia, Adjustment disorder with mixed anxiety and depressed mood, Hyperlipidemia, unspecified hyperlipidemia type, and Eye swelling, right were also pertinent to this visit.  Visit Diagnosis: 1. Insomnia, unspecified type  Worsening  Melatonin 10 mg QHs  Will follow up in one month We discussed at length sleep hygiene measures including  regular sleep schedule, optimal sleep environment, and relaxing presleep rituals. Avoid daytime naps. Avoid drinking large amounts of water at night.  Recommended daily exercise    2. Hypothyroidism, unspecified type  Stable will complete labs   3. Tachycardia  Stable  4. Adjustment disorder with mixed anxiety and depressed mood  Stable continues on Effexor 150 mg   5. Hyperlipidemia, unspecified hyperlipidemia type  Labs to be completed at follow up visit.  6. Eye swelling, right   Persistent Encouraged pt to call her provider for evaluation especially with her history     Follow Up Instructions: FU 4 weeks   I discussed the assessment and treatment plan with the patient. The patient was provided an opportunity to ask questions and all were answered. The patient agreed with the plan and demonstrated an understanding of the instructions.   The patient was advised to call back or seek an in-person evaluation if the symptoms worsen or if the condition fails to improve as anticipated.  I provided 15 minutes of video- face-to-face time during this encounter.   Vevelyn Francois, NP

## 2020-11-08 NOTE — Patient Instructions (Signed)

## 2020-11-10 DIAGNOSIS — E039 Hypothyroidism, unspecified: Secondary | ICD-10-CM | POA: Diagnosis not present

## 2020-11-10 DIAGNOSIS — I10 Essential (primary) hypertension: Secondary | ICD-10-CM | POA: Diagnosis not present

## 2020-11-10 DIAGNOSIS — E785 Hyperlipidemia, unspecified: Secondary | ICD-10-CM | POA: Diagnosis not present

## 2020-11-19 ENCOUNTER — Ambulatory Visit: Payer: Medicaid Other | Admitting: Nurse Practitioner

## 2020-11-19 NOTE — Progress Notes (Deleted)
Patient no-showed today's appointment; provider notified for review of record, attempted to reach patient no answer.

## 2020-11-24 ENCOUNTER — Other Ambulatory Visit: Payer: Self-pay | Admitting: Nurse Practitioner

## 2020-11-25 ENCOUNTER — Ambulatory Visit (INDEPENDENT_AMBULATORY_CARE_PROVIDER_SITE_OTHER): Payer: Medicaid Other | Admitting: Nurse Practitioner

## 2020-11-25 ENCOUNTER — Encounter: Payer: Self-pay | Admitting: Nurse Practitioner

## 2020-11-25 ENCOUNTER — Other Ambulatory Visit: Payer: Self-pay

## 2020-11-25 VITALS — BP 133/86 | HR 120 | Temp 96.4°F | Ht 59.0 in | Wt 202.2 lb

## 2020-11-25 DIAGNOSIS — F4323 Adjustment disorder with mixed anxiety and depressed mood: Secondary | ICD-10-CM

## 2020-11-25 DIAGNOSIS — R000 Tachycardia, unspecified: Secondary | ICD-10-CM

## 2020-11-25 DIAGNOSIS — E039 Hypothyroidism, unspecified: Secondary | ICD-10-CM

## 2020-11-25 DIAGNOSIS — H5789 Other specified disorders of eye and adnexa: Secondary | ICD-10-CM | POA: Diagnosis not present

## 2020-11-25 DIAGNOSIS — E781 Pure hyperglyceridemia: Secondary | ICD-10-CM | POA: Diagnosis not present

## 2020-11-25 DIAGNOSIS — R5383 Other fatigue: Secondary | ICD-10-CM | POA: Diagnosis not present

## 2020-11-25 DIAGNOSIS — E785 Hyperlipidemia, unspecified: Secondary | ICD-10-CM | POA: Diagnosis not present

## 2020-11-25 MED ORDER — FENOFIBRATE 48 MG PO TABS: 48.0000 mg | ORAL_TABLET | Freq: Every day | ORAL | 3 refills | Status: DC

## 2020-11-25 MED ORDER — VENLAFAXINE HCL ER 150 MG PO CP24
150.0000 mg | ORAL_CAPSULE | Freq: Every day | ORAL | 11 refills | Status: DC
Start: 2020-11-25 — End: 2021-12-02

## 2020-11-25 MED ORDER — LEVOTHYROXINE SODIUM 75 MCG PO TABS: 75.0000 ug | ORAL_TABLET | Freq: Every day | ORAL | 11 refills | Status: DC

## 2020-11-25 MED ORDER — ROSUVASTATIN CALCIUM 10 MG PO TABS: 10.0000 mg | ORAL_TABLET | Freq: Every day | ORAL | 11 refills | Status: DC

## 2020-11-25 MED ORDER — OMEGA-3-ACID ETHYL ESTERS 1 G PO CAPS: 1.0000 g | ORAL_CAPSULE | Freq: Two times a day (BID) | ORAL | 11 refills | Status: DC

## 2020-11-25 NOTE — Progress Notes (Signed)
[place], [place]  [address] Phone:  [phone]   Fax:  [phone]    Established Patient Office Visit  Subjective:  Patient ID: [name], female    DOB: 03-15-1956  Age: 65 y.o. MRN: [id number]  CC:  Chief Complaint  Patient presents with  . Follow-up    Swelling up right eye notice since [name] had covid about 1 year ago, having some drainage ,blurred vision , tender to touch     HPI [name] presents for follow up. 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   Bump Under Skin Her subcutaneous nodule is located over the DERM LESION LOCATION: eye. This has been present for 2 year(s). There has been discharge and swelling. [name] does not have a history of epidermal inclusion cysts. [name] does not have a history of lipomas. [name] feels like [name] this has been going post COVID. [name] denies any changes in her vision however [name] only has sight in the right eye. Her last eye exam was in 2021. [name] admits that [name] got new glasses. [name] admtis that this was not seen or treated by the eye doctor. [name] denies any additional symptoms.  Hypothyroidism [name] also presents for follow up of hypothyroidism. Current symptoms: none . Patient denies change in energy level, diarrhea, heat / cold intolerance, nervousness, palpitations and weight changes. Symptoms have stabilized.   Past Medical History:  Diagnosis Date  . 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 Amblyopia   . Anemia   . Arthritis    "in my lower  back"  . Asthma   . Atrial myxoma   . Blindness of left eye 1978  . Blood transfusion   . Breast cyst   . Constipation   . 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 CVA (cerebral infarction)   . Depression   . Depression   . 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 Hemorrhoids   . Hyperlipemia   . Hypothyroidism    per office note dated 03/25/2014  . Migraine   . Peptic ulcer associated with Helicobacter pylori infection   . Reflux   . Sinus headache   . Stomach ulcer    "from the hydrocortisone"  . Stroke [facility]) 1978   "piece tumor broke off & went to left eye; leaving me blind"  . Thyroid disease    pt unsure if hyper or hypo???    Past Surgical History:  Procedure Laterality Date  . ADRENAL GLAND SURGERY  1982   on hydrocortisone  . ATRIAL MYXOMA EXCISION  1978  . BREAST BIOPSY  07/1999   ductal tissue excision & bx right; bx only left breast  . EYE SURGERY    . LEFT HEART CATHETERIZATION WITH CORONARY ANGIOGRAM N/A 10/06/2011   Procedure: LEFT HEART CATHETERIZATION WITH CORONARY ANGIOGRAM;  Surgeon: Clent Demark, MD;  Location: [facility] CATH LAB;  Service: Cardiovascular;  Laterality: N/A;  . repaired cross eyed     "as a child"  . ROTATOR CUFF REPAIR  05/2005   left  . VAGINAL HYSTERECTOMY  2000   "partial; w/left ovary"    Family History  Problem Relation Age  of Onset  . Colon cancer Sister 57  . Acromegaly Father   . Heart disease Father        Tumor in the heart  . Stomach cancer Father   . Diabetes Sister   . Kidney disease Sister        x2  . Heart disease Sister        Tumor in the heart  . Heart disease Daughter        Tumor in the heart  . Colon polyps Neg Hx   . Esophageal cancer Neg Hx   . Gallbladder disease Neg Hx   . Migraines Neg Hx     Social History   Socioeconomic History  . Marital status: Married    Spouse name: Not on file  . Number of children: 2  . Years of education: Not on file  . Highest education level: Not on file  Occupational History   . Occupation: Disabled  Tobacco Use  . Smoking status: Never Smoker  . Smokeless tobacco: Never Used  Vaping Use  . Vaping Use: Never used  Substance and Sexual Activity  . Alcohol use: No  . Drug use: No  . Sexual activity: Yes  Other Topics Concern  . Not on file  Social History Narrative   Lives with sister in law right now   Left handed   Caffeine: none    Social Determinants of Health   Financial Resource Strain: Not on file  Food Insecurity: Not on file  Transportation Needs: Not on file  Physical Activity: Not on file  Stress: Not on file  Social Connections: Not on file  Intimate Partner Violence: Not on file    Outpatient Medications Prior to Visit  Medication Sig Dispense Refill  . aspirin EC 81 MG tablet Take 81 mg by mouth daily. Swallow whole.    . Cobalamin Combinations (NEURIVA PLUS PO) Take 1 capsule by mouth daily.    . Galcanezumab-gnlm (EMGALITY) 120 MG/ML SOAJ Inject 120 mg into the skin every 30 (thirty) days. 1.12 mL 11  . hydrocortisone (CORTEF) 20 MG tablet Take 0.5-1 tablets (10-20 mg total) by mouth 2 (two) times daily. Take 1 tablet every morning and 1/2 tablet every evening (Patient taking differently: Take 20 mg by mouth See admin instructions. Take 20 mg by mouth with a meal and 10 mg at bedtime) 45 tablet o  . Melatonin 10 MG TABS Take 1 tablet by mouth at bedtime. 90 tablet 3  . metoprolol tartrate (LOPRESSOR) 25 MG tablet Take 1 tablet (25 mg total) by mouth daily.    . Rimegepant Sulfate (NURTEC) 75 MG TBDP Take 75 mg by mouth daily as needed. For migraines. Take as close to onset of migraine as possible. One daily maximum. 8 tablet 6  . fenofibrate (TRICOR) 48 MG tablet Take 1 tablet (48 mg total) by mouth daily. 90 tablet 3  . levothyroxine (SYNTHROID) 75 MCG tablet Take 1 tablet (75 mcg total) by mouth daily. 30 tablet 2  . omega-3 acid ethyl esters (LOVAZA) 1 g capsule Take 1 capsule (1 g total) by mouth 2 (two) times daily. 120 capsule 3   . rosuvastatin (CRESTOR) 10 MG tablet Take 10 mg by mouth daily.    Marland Kitchen venlafaxine XR (EFFEXOR-XR) 150 MG 24 hr capsule Take 1 capsule (150 mg total) by mouth daily. 30 capsule 2  . omeprazole (PRILOSEC) 20 MG capsule Take 20 mg by mouth 2 (two) times daily.     No  facility-administered medications prior to visit.    Allergies  Allergen Reactions  . Aspirin Other (See Comments)    Can take baby aspirin (heart flutters with high doses)  . Ibuprofen [Ibuprofen] Nausea And Vomiting  . Penicillins Nausea And Vomiting    Has patient had a PCN reaction causing immediate rash, facial/tongue/throat swelling, SOB or lightheadedness with hypotension: Yes Has patient had a PCN reaction causing severe rash involving mucus membranes or skin necrosis: No Has patient had a PCN reaction that required hospitalization No Has patient had a PCN reaction occurring within the last 10 years: No If all of the above answers are "NO", then may proceed with Cephalosporin use.     ROS Review of Systems  Constitutional: Positive for fatigue.  Eyes: Positive for discharge, redness, itching and visual disturbance (left eye blindness). Negative for photophobia and pain.  Cardiovascular: Negative for chest pain.      Objective:    Physical Exam Constitutional:      Appearance: [name] is obese.  HENT:     Head: Normocephalic and atraumatic.  Eyes:     Comments: Palpable cyst to the right lower eye area  Cardiovascular:     Rate and Rhythm: Tachycardia present. Rhythm irregular.     Pulses: Normal pulses.     Heart sounds: Normal heart sounds.  Pulmonary:     Effort: Pulmonary effort is normal.     Breath sounds: Normal breath sounds.  Abdominal:     Palpations: Abdomen is soft.  Musculoskeletal:        General: Normal range of motion.     Cervical back: Normal range of motion.  Skin:    General: Skin is warm.     Capillary Refill: Capillary refill takes less than 2 seconds.  Neurological:      General: No focal deficit present.     Mental Status: [name] is alert and oriented to person, place, and time.  Psychiatric:        Mood and Affect: Mood normal.        Behavior: Behavior normal.        Thought Content: Thought content normal.        Judgment: Judgment normal.     BP 133/86 (BP Location: Left Arm, Patient Position: Sitting, Cuff Size: Normal)   Pulse (!) 120   Temp (!) 96.4 F (35.8 C) (Temporal)   Ht 4\' 11"  (1.499 m)   Wt 202 lb 3.2 oz (91.7 kg)   SpO2 97%   BMI 40.84 kg/m  Wt Readings from Last 3 Encounters:  11/25/20 202 lb 3.2 oz (91.7 kg)  05/25/20 202 lb (91.6 kg)  04/01/20 (!) 209 lb 6.4 oz (95 kg)     Health Maintenance Due  Topic Date Due  . PAP SMEAR-Modifier  Never done  . MAMMOGRAM  08/16/2008  . COLONOSCOPY (Pts 45-49yrs Insurance coverage will need to be confirmed)  11/16/2019    There are no preventive care reminders to display for this patient.  Lab Results  Component Value Date   TSH 0.247 (L) 04/01/2020   Lab Results  Component Value Date   WBC 6.0 09/07/2020   HGB 14.0 09/07/2020   HCT 44.3 09/07/2020   MCV 92.1 09/07/2020   PLT 302 09/07/2020   Lab Results  Component Value Date   NA 135 09/07/2020   K 3.8 09/07/2020   CO2 22 09/07/2020   GLUCOSE 104 (H) 09/07/2020   BUN 13 09/07/2020   CREATININE 1.02 (H) 09/07/2020  BILITOT 0.5 09/07/2020   ALKPHOS 54 09/07/2020   AST 28 09/07/2020   ALT 21 09/07/2020   PROT 7.3 09/07/2020   ALBUMIN 4.0 09/07/2020   CALCIUM 9.5 09/07/2020   ANIONGAP 13 09/07/2020   Lab Results  Component Value Date   CHOL 164 09/27/2018   Lab Results  Component Value Date   HDL 50 09/27/2018   Lab Results  Component Value Date   LDLCALC 73 09/27/2018   Lab Results  Component Value Date   TRIG 198 (H) 10/10/2019   Lab Results  Component Value Date   CHOLHDL 3.3 09/27/2018   No results found for: HGBA1C    Assessment & Plan:   Problem List Items Addressed This Visit       Endocrine   Hypothyroidism - Primary (Chronic) Stable however heart rate is increased labs pending   Relevant Medications   levothyroxine (SYNTHROID) 75 MCG tablet   Other Relevant Orders   TSH (Completed)   T3 (Completed)   T4, free (Completed)     Other   Tachycardia Persistent we will continue to evaluate possible cardiology referral   Adjustment disorder with mixed anxiety and depressed mood   Relevant Medications   venlafaxine XR (EFFEXOR-XR) 150 MG 24 hr capsule    Other Visit Diagnoses    Hyperlipidemia, unspecified hyperlipidemia type       Relevant Medications   omega-3 acid ethyl esters (LOVAZA) 1 g capsule   rosuvastatin (CRESTOR) 10 MG tablet   fenofibrate (TRICOR) 48 MG tablet   Eye swelling, right     Persistent encourage patient to call ophthalmology to make follow-up appointment   Hypertriglyceridemia     Stable condition on monitoring labs pending   Relevant Medications   omega-3 acid ethyl esters (LOVAZA) 1 g capsule   rosuvastatin (CRESTOR) 10 MG tablet   fenofibrate (TRICOR) 48 MG tablet   Fatigue, unspecified type Persistent additional laboratory evaluation   Relevant Orders   CBC with Differential/Platelet (Completed)   Comp. Metabolic Panel (12) (Completed)      Meds ordered this encounter  Medications  . omega-3 acid ethyl esters (LOVAZA) 1 g capsule    Sig: Take 1 capsule (1 g total) by mouth 2 (two) times daily.    Dispense:  60 capsule    Refill:  11    Order Specific Question:   Supervising Provider    Answer:   Tresa Garter [id number]  . venlafaxine XR (EFFEXOR-XR) 150 MG 24 hr capsule    Sig: Take 1 capsule (150 mg total) by mouth daily.    Dispense:  30 capsule    Refill:  11    Order Specific Question:   Supervising Provider    Answer:   Tresa Garter [id number]  . rosuvastatin (CRESTOR) 10 MG tablet    Sig: Take 1 tablet (10 mg total) by mouth daily.    Dispense:  30 tablet    Refill:  11    Order Specific  Question:   Supervising Provider    Answer:   Tresa Garter [id number]  . levothyroxine (SYNTHROID) 75 MCG tablet    Sig: Take 1 tablet (75 mcg total) by mouth daily.    Dispense:  30 tablet    Refill:  11    Order Specific Question:   Supervising Provider    Answer:   Tresa Garter [id number]  . fenofibrate (TRICOR) 48 MG tablet    Sig: Take 1 tablet (48 mg total) by mouth daily.  Dispense:  90 tablet    Refill:  3    Order Specific Question:   Supervising Provider    Answer:   Tresa Garter [id number]    Follow-up: Return in about 6 months (around 05/28/2021).    Vevelyn Francois, NP

## 2020-11-26 LAB — COMP. METABOLIC PANEL (12)
AST: 25 IU/L (ref 0–40)
Albumin/Globulin Ratio: 1.5 (ref 1.2–2.2)
Albumin: 4.4 g/dL (ref 3.8–4.8)
Alkaline Phosphatase: 70 IU/L (ref 44–121)
BUN/Creatinine Ratio: 17 (ref 12–28)
BUN: 16 mg/dL (ref 8–27)
Bilirubin Total: 0.3 mg/dL (ref 0.0–1.2)
Calcium: 10.2 mg/dL (ref 8.7–10.3)
Chloride: 98 mmol/L (ref 96–106)
Creatinine, Ser: 0.93 mg/dL (ref 0.57–1.00)
Globulin, Total: 2.9 g/dL (ref 1.5–4.5)
Glucose: 122 mg/dL — ABNORMAL HIGH (ref 65–99)
Potassium: 4.5 mmol/L (ref 3.5–5.2)
Sodium: 137 mmol/L (ref 134–144)
Total Protein: 7.3 g/dL (ref 6.0–8.5)
eGFR: 69 mL/min/{1.73_m2} (ref >59)

## 2020-11-26 LAB — CBC WITH DIFFERENTIAL/PLATELET
Basophils Absolute: 0 10*3/uL (ref 0.0–0.2)
Basos: 1 %
EOS (ABSOLUTE): 0.4 10*3/uL (ref 0.0–0.4)
Eos: 7 %
Hematocrit: 44.8 % (ref 34.0–46.6)
Hemoglobin: 14.9 g/dL (ref 11.1–15.9)
Immature Grans (Abs): 0 10*3/uL (ref 0.0–0.1)
Immature Granulocytes: 0 %
Lymphocytes Absolute: 2.7 10*3/uL (ref 0.7–3.1)
Lymphs: 47 %
MCH: 29.8 pg (ref 26.6–33.0)
MCHC: 33.3 g/dL (ref 31.5–35.7)
MCV: 90 fL (ref 79–97)
Monocytes Absolute: 0.5 10*3/uL (ref 0.1–0.9)
Monocytes: 9 %
Neutrophils Absolute: 2 10*3/uL (ref 1.4–7.0)
Neutrophils: 36 %
Platelets: 258 10*3/uL (ref 150–450)
RBC: 5 x10E6/uL (ref 3.77–5.28)
RDW: 13.3 % (ref 11.7–15.4)
WBC: 5.6 10*3/uL (ref 3.4–10.8)

## 2020-11-26 LAB — T3: T3, Total: 137 ng/dL (ref 71–180)

## 2020-11-26 LAB — TSH: TSH: 0.436 u[IU]/mL — ABNORMAL LOW (ref 0.450–4.500)

## 2020-11-26 LAB — T4, FREE: Free T4: 1.54 ng/dL (ref 0.82–1.77)

## 2020-12-01 ENCOUNTER — Other Ambulatory Visit: Payer: Self-pay | Admitting: Nurse Practitioner

## 2020-12-01 ENCOUNTER — Telehealth: Payer: Self-pay

## 2020-12-01 DIAGNOSIS — R5383 Other fatigue: Secondary | ICD-10-CM

## 2020-12-01 DIAGNOSIS — R000 Tachycardia, unspecified: Secondary | ICD-10-CM

## 2020-12-01 DIAGNOSIS — I499 Cardiac arrhythmia, unspecified: Secondary | ICD-10-CM

## 2020-12-01 NOTE — Progress Notes (Signed)
   [place], [place]  [address] Phone:  [phone]   Fax:  [phone]  Patient complaining of fatigue and has been arrhythmia.   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) (1978), and Thyroid disease.    Overall labs are stable.  Cardiology referral placed for further evaluation

## 2020-12-01 NOTE — Telephone Encounter (Signed)
-----   Message from Vevelyn Francois, NP sent at 12/01/2020 11:54 AM EDT ----- Call Ms. Vohs and make her aware that overall her labs are stable.  She did complain of fatigue at her last visit and her heart rate was elevated.  I want to refer her to cardiology for evaluation.  Thanks

## 2020-12-01 NOTE — Telephone Encounter (Signed)
Left message for patient to call office regarding labs and recommendations.  Referral placed.

## 2020-12-04 ENCOUNTER — Observation Stay (HOSPITAL_COMMUNITY)
Admission: EM | Admit: 2020-12-04 | Discharge: 2020-12-05 | Disposition: A | Discharge status: Home / Self Care | Payer: Medicaid Other | Attending: Cardiology | Admitting: Cardiology

## 2020-12-04 ENCOUNTER — Emergency Department (HOSPITAL_COMMUNITY): Payer: Medicaid Other

## 2020-12-04 ENCOUNTER — Other Ambulatory Visit: Payer: Self-pay

## 2020-12-04 ENCOUNTER — Encounter (HOSPITAL_COMMUNITY): Payer: Self-pay

## 2020-12-04 DIAGNOSIS — R079 Chest pain, unspecified: Secondary | ICD-10-CM | POA: Diagnosis not present

## 2020-12-04 DIAGNOSIS — R000 Tachycardia, unspecified: Secondary | ICD-10-CM | POA: Diagnosis not present

## 2020-12-04 DIAGNOSIS — E039 Hypothyroidism, unspecified: Secondary | ICD-10-CM | POA: Diagnosis not present

## 2020-12-04 DIAGNOSIS — I209 Angina pectoris, unspecified: Principal | ICD-10-CM | POA: Insufficient documentation

## 2020-12-04 DIAGNOSIS — I10 Essential (primary) hypertension: Secondary | ICD-10-CM | POA: Insufficient documentation

## 2020-12-04 DIAGNOSIS — R071 Chest pain on breathing: Secondary | ICD-10-CM | POA: Diagnosis not present

## 2020-12-04 DIAGNOSIS — U071 COVID-19: Secondary | ICD-10-CM | POA: Diagnosis not present

## 2020-12-04 DIAGNOSIS — Z8673 Personal history of transient ischemic attack (TIA), and cerebral infarction without residual deficits: Secondary | ICD-10-CM | POA: Diagnosis not present

## 2020-12-04 DIAGNOSIS — Z79899 Other long term (current) drug therapy: Secondary | ICD-10-CM | POA: Insufficient documentation

## 2020-12-04 DIAGNOSIS — Z886 Allergy status to analgesic agent status: Secondary | ICD-10-CM | POA: Diagnosis not present

## 2020-12-04 DIAGNOSIS — R0902 Hypoxemia: Secondary | ICD-10-CM | POA: Diagnosis not present

## 2020-12-04 DIAGNOSIS — J45909 Unspecified asthma, uncomplicated: Secondary | ICD-10-CM | POA: Insufficient documentation

## 2020-12-04 DIAGNOSIS — R0789 Other chest pain: Secondary | ICD-10-CM | POA: Diagnosis not present

## 2020-12-04 DIAGNOSIS — Z8616 Personal history of COVID-19: Secondary | ICD-10-CM | POA: Insufficient documentation

## 2020-12-04 DIAGNOSIS — Z7982 Long term (current) use of aspirin: Secondary | ICD-10-CM | POA: Diagnosis not present

## 2020-12-04 LAB — BASIC METABOLIC PANEL
Anion gap: 7 (ref 5–15)
BUN: 12 mg/dL (ref 8–23)
CO2: 25 mmol/L (ref 22–32)
Calcium: 9.7 mg/dL (ref 8.9–10.3)
Chloride: 103 mmol/L (ref 98–111)
Creatinine, Ser: 0.92 mg/dL (ref 0.44–1.00)
GFR, Estimated: >60 mL/min (ref >60)
Glucose, Bld: 102 mg/dL — ABNORMAL HIGH (ref 70–99)
Potassium: 4.4 mmol/L (ref 3.5–5.1)
Sodium: 135 mmol/L (ref 135–145)

## 2020-12-04 LAB — CBC
HCT: 44.3 % (ref 36.0–46.0)
Hemoglobin: 14.3 g/dL (ref 12.0–15.0)
MCH: 29.9 pg (ref 26.0–34.0)
MCHC: 32.3 g/dL (ref 30.0–36.0)
MCV: 92.5 fL (ref 80.0–100.0)
Platelets: 342 10*3/uL (ref 150–400)
RBC: 4.79 MIL/uL (ref 3.87–5.11)
RDW: 13.2 % (ref 11.5–15.5)
WBC: 8.2 10*3/uL (ref 4.0–10.5)
nRBC: 0 % (ref 0.0–0.2)

## 2020-12-04 LAB — HEPARIN LEVEL (UNFRACTIONATED): Heparin Unfractionated: <0.1 IU/mL — ABNORMAL LOW (ref 0.30–0.70)

## 2020-12-04 LAB — TSH: TSH: 0.186 u[IU]/mL — ABNORMAL LOW (ref 0.350–4.500)

## 2020-12-04 LAB — TROPONIN I (HIGH SENSITIVITY)
Troponin I (High Sensitivity): 3 ng/L (ref <18)
Troponin I (High Sensitivity): <2 ng/L (ref <18)
Troponin I (High Sensitivity): <2 ng/L (ref <18)
Troponin I (High Sensitivity): <2 ng/L (ref <18)

## 2020-12-04 LAB — MAGNESIUM: Magnesium: 1.9 mg/dL (ref 1.7–2.4)

## 2020-12-04 LAB — HIV ANTIBODY (ROUTINE TESTING W REFLEX): HIV Screen 4th Generation wRfx: NONREACTIVE

## 2020-12-04 MED ORDER — METOPROLOL TARTRATE 12.5 MG HALF TABLET
12.5000 mg | ORAL_TABLET | Freq: Two times a day (BID) | ORAL | Status: DC
  Administered 2020-12-04 – 2020-12-05 (×2): 12.5 mg via ORAL
  Filled 2020-12-04 (×2): qty 1

## 2020-12-04 MED ORDER — HYDROCORTISONE 10 MG PO TABS
20.0000 mg | ORAL_TABLET | Freq: Every day | ORAL | Status: DC
  Administered 2020-12-05: 20 mg via ORAL
  Filled 2020-12-04: qty 2

## 2020-12-04 MED ORDER — ASPIRIN EC 81 MG PO TBEC
81.0000 mg | DELAYED_RELEASE_TABLET | Freq: Every day | ORAL | Status: DC
  Administered 2020-12-05: 81 mg via ORAL
  Filled 2020-12-04: qty 1

## 2020-12-04 MED ORDER — VENLAFAXINE HCL ER 75 MG PO CP24
150.0000 mg | ORAL_CAPSULE | Freq: Every day | ORAL | Status: DC
  Administered 2020-12-05: 150 mg via ORAL
  Filled 2020-12-04: qty 2

## 2020-12-04 MED ORDER — NITROGLYCERIN 0.4 MG SL SUBL: 0.4000 mg | SUBLINGUAL_TABLET | SUBLINGUAL | Status: DC | PRN

## 2020-12-04 MED ORDER — ASPIRIN 81 MG PO CHEW: 324.0000 mg | CHEWABLE_TABLET | ORAL | Status: DC

## 2020-12-04 MED ORDER — HEPARIN (PORCINE) 25000 UT/250ML-% IV SOLN
1100.0000 [IU]/h | INTRAVENOUS | Status: DC
  Administered 2020-12-04: 800 [IU]/h via INTRAVENOUS
  Filled 2020-12-04: qty 250

## 2020-12-04 MED ORDER — HYDROCORTISONE 10 MG PO TABS
10.0000 mg | ORAL_TABLET | Freq: Every day | ORAL | Status: DC
  Administered 2020-12-04: 10 mg via ORAL
  Filled 2020-12-04: qty 1

## 2020-12-04 MED ORDER — HEPARIN BOLUS VIA INFUSION
4000.0000 [IU] | Freq: Once | INTRAVENOUS | Status: AC
  Administered 2020-12-04: 4000 [IU] via INTRAVENOUS
  Filled 2020-12-04: qty 4000

## 2020-12-04 MED ORDER — NITROGLYCERIN 2 % TD OINT
0.5000 [in_us] | TOPICAL_OINTMENT | Freq: Once | TRANSDERMAL | Status: AC
  Administered 2020-12-04: 0.5 [in_us] via TOPICAL
  Filled 2020-12-04: qty 1

## 2020-12-04 MED ORDER — LEVOTHYROXINE SODIUM 75 MCG PO TABS
75.0000 ug | ORAL_TABLET | Freq: Every day | ORAL | Status: DC
  Administered 2020-12-05: 75 ug via ORAL
  Filled 2020-12-04: qty 1

## 2020-12-04 MED ORDER — ONDANSETRON HCL 4 MG/2ML IJ SOLN: 4.0000 mg | Freq: Four times a day (QID) | INTRAMUSCULAR | Status: DC | PRN

## 2020-12-04 MED ORDER — ACETAMINOPHEN 325 MG PO TABS
650.0000 mg | ORAL_TABLET | ORAL | Status: DC | PRN
  Filled 2020-12-04: qty 2

## 2020-12-04 MED ORDER — SODIUM CHLORIDE 0.9 % IV SOLN: INTRAVENOUS | Status: DC

## 2020-12-04 MED ORDER — SODIUM CHLORIDE 0.9% FLUSH: 3.0000 mL | Freq: Once | INTRAVENOUS | Status: DC

## 2020-12-04 MED ORDER — NITROGLYCERIN 2 % TD OINT
0.5000 [in_us] | TOPICAL_OINTMENT | Freq: Three times a day (TID) | TRANSDERMAL | Status: DC
  Administered 2020-12-04 – 2020-12-05 (×2): 0.5 [in_us] via TOPICAL
  Filled 2020-12-04: qty 30

## 2020-12-04 MED ORDER — NEURIVA PLUS PO CAPS: ORAL_CAPSULE | Freq: Every day | ORAL | Status: DC

## 2020-12-04 MED ORDER — ATORVASTATIN CALCIUM 80 MG PO TABS
80.0000 mg | ORAL_TABLET | Freq: Every day | ORAL | Status: DC
  Administered 2020-12-05: 80 mg via ORAL
  Filled 2020-12-04: qty 1

## 2020-12-04 MED ORDER — PANTOPRAZOLE SODIUM 40 MG PO TBEC
40.0000 mg | DELAYED_RELEASE_TABLET | Freq: Every day | ORAL | Status: DC
  Administered 2020-12-05: 40 mg via ORAL
  Filled 2020-12-04: qty 1

## 2020-12-04 MED ORDER — MELATONIN 5 MG PO TABS
10.0000 mg | ORAL_TABLET | Freq: Every day | ORAL | Status: DC
  Administered 2020-12-04: 10 mg via ORAL
  Filled 2020-12-04 (×2): qty 2

## 2020-12-04 MED ORDER — ASPIRIN 300 MG RE SUPP: 300.0000 mg | RECTAL | Status: DC

## 2020-12-04 NOTE — ED Notes (Signed)
Notified phlebotomy for blood work due to difficult access.

## 2020-12-04 NOTE — ED Notes (Signed)
Attempted 2 times for IV access. Unsuccessful. Will notify secondary RN to attempt IV placement.

## 2020-12-04 NOTE — H&P (Signed)
[name] is an 65 y.o. female.   Chief Complaint: Retrosternal chest pain radiating to left arm HPI: Patient is 65 year old female with past medical history significant for hypertension, hyperlipidemia, hypothyroidism, adrenal insufficiency on chronic steroids, blindness left eye, CVA, depression, history of atrial myxoma excision in 1970s, came to ER by EMS complaining of retrosternal chest pain described as pressure grade 8/10 radiating to left arm associated with diaphoresis which woke her up early this morning patient received aspirin with partial relief states chest pain comes and goes now occasionally increases with deep breathing.  Patient denies any cough fever chills sore throat.  Denies any nausea vomiting.  Denies palpitation lightheadedness or syncope.  Denies PND orthopnea leg swelling.  EKG done in the ED showed normal sinus rhythm with minor T wave inversion in anterolateral leads high-sensitivity troponin I has been negative.  Denies any recent cardiac work-up.  Past Medical History:  Diagnosis Date  . 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 Amblyopia   . Anemia   . Arthritis    "in my lower back"  . Asthma   . Atrial myxoma   . Blindness of left eye 1978  . Blood transfusion   . Breast cyst   . Constipation   . 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 CVA (cerebral infarction)   . Depression   . Depression   . 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 Hemorrhoids   . Hyperlipemia   . Hypothyroidism    per office note dated 03/25/2014  . Migraine   . Peptic ulcer associated with Helicobacter pylori infection   . Reflux   . Sinus headache   . Stomach ulcer    "from the hydrocortisone"  . Stroke [facility]) 1978   "piece tumor broke off & went to left eye; leaving me blind"  . Thyroid disease    pt unsure if hyper or hypo???    Past Surgical History:  Procedure Laterality Date  . ADRENAL GLAND SURGERY  1982   on hydrocortisone  . ATRIAL MYXOMA EXCISION  1978  . BREAST BIOPSY   07/1999   ductal tissue excision & bx right; bx only left breast  . EYE SURGERY    . LEFT HEART CATHETERIZATION WITH CORONARY ANGIOGRAM N/A 10/06/2011   Procedure: LEFT HEART CATHETERIZATION WITH CORONARY ANGIOGRAM;  Surgeon: Clent Demark, MD;  Location: [facility] CATH LAB;  Service: Cardiovascular;  Laterality: N/A;  . repaired cross eyed     "as a child"  . ROTATOR CUFF REPAIR  05/2005   left  . VAGINAL HYSTERECTOMY  2000   "partial; w/left ovary"    Family History  Problem Relation Age of Onset  . Colon cancer Sister 39  . Acromegaly Father   . Heart disease Father        Tumor in the heart  . Stomach cancer Father   . Diabetes Sister   . Kidney disease Sister        x2  . Heart disease Sister        Tumor in the heart  . Heart disease Daughter        Tumor in the heart  . Colon polyps Neg Hx   . Esophageal cancer Neg Hx   . Gallbladder disease Neg Hx   . Migraines Neg Hx    Social History:  reports that she has never smoked. She has never used smokeless tobacco. She reports that she does not drink alcohol and does not use drugs.  Allergies:  Allergies  Allergen Reactions  . Aspirin Other (See Comments)    Can take baby aspirin (heart flutters with high doses)  . Ibuprofen [Ibuprofen] Nausea And Vomiting  . Penicillins Nausea And Vomiting    Has patient had a PCN reaction causing immediate rash, facial/tongue/throat swelling, SOB or lightheadedness with hypotension: Yes Has patient had a PCN reaction causing severe rash involving mucus membranes or skin necrosis: No Has patient had a PCN reaction that required hospitalization No Has patient had a PCN reaction occurring within the last 10 years: No If all of the above answers are "NO", then may proceed with Cephalosporin use.     (Not in a hospital admission)   Results for orders placed or performed during the hospital encounter of 12/04/20 (from the past 48 hour(s))  Basic metabolic panel     Status: Abnormal    Collection Time: 12/04/20  3:00 PM  Result Value Ref Range   Sodium 135 135 - 145 mmol/L   Potassium 4.4 3.5 - 5.1 mmol/L   Chloride 103 98 - 111 mmol/L   CO2 25 22 - 32 mmol/L   Glucose, Bld 102 (H) 70 - 99 mg/dL    Comment: Glucose reference range applies only to samples taken after fasting for at least 8 hours.   BUN 12 8 - 23 mg/dL   Creatinine, Ser 0.92 0.44 - 1.00 mg/dL   Calcium 9.7 8.9 - 10.3 mg/dL   GFR, Estimated >60 >60 mL/min    Comment: (NOTE) Calculated using the CKD-EPI Creatinine Equation (2021)    Anion gap 7 5 - 15    Comment: Performed at [facility] [address], [place], [place] [address]  CBC     Status: None   Collection Time: 12/04/20  3:00 PM  Result Value Ref Range   WBC 8.2 4.0 - 10.5 K/uL   RBC 4.79 3.87 - 5.11 MIL/uL   Hemoglobin 14.3 12.0 - 15.0 g/dL   HCT 44.3 36.0 - 46.0 %   MCV 92.5 80.0 - 100.0 fL   MCH 29.9 26.0 - 34.0 pg   MCHC 32.3 30.0 - 36.0 g/dL   RDW 13.2 11.5 - 15.5 %   Platelets 342 150 - 400 K/uL   nRBC 0.0 0.0 - 0.2 %    Comment: Performed at [facility], [place] [address] [place], [place] [address]  Troponin I (High Sensitivity)     Status: None   Collection Time: 12/04/20  3:00 PM  Result Value Ref Range   Troponin I (High Sensitivity) 3 <18 ng/L    Comment: (NOTE) Elevated high sensitivity troponin I (hsTnI) values and significant  changes across serial measurements may suggest ACS but many other  chronic and acute conditions are known to elevate hsTnI results.  Refer to the "Links" section for chest pain algorithms and additional  guidance. Performed at [facility], [place] [address], [place], [place] [address]    DG Chest 2 View  Result Date: 12/04/2020 CLINICAL DATA:  Chest pain radiating down LEFT arm beginning this morning, history of stroke, Cushing syndrome EXAM: CHEST - 2 VIEW COMPARISON:  09/07/2020 FINDINGS: Epicardial pacing leads present. Normal heart size post median sternotomy. Mediastinal  contours and pulmonary vascularity normal. Lungs clear. Mild chronic central peribronchial thickening. No infiltrate, pleural effusion or pneumothorax. Bones demineralized. IMPRESSION: Mild bronchitic changes without infiltrate. Electronically Signed   By: Lavonia Dana M.D.   On: 12/04/2020 14:37    Review of Systems  Constitutional: Positive for diaphoresis. Negative for chills and fever.  HENT: Negative for sore throat.   Eyes: Negative for pain.  Respiratory: Negative for shortness of breath and wheezing.   Cardiovascular: Positive for chest pain. Negative for palpitations and leg swelling.  Gastrointestinal: Negative for abdominal distention and abdominal pain.  Endocrine: Negative for cold intolerance.  Genitourinary: Negative for difficulty urinating.  Neurological: Negative for dizziness.    Blood pressure 106/72, pulse 78, temperature 98.6 F (37 C), temperature source Oral, resp. rate (!) 24, height 5' (1.524 m), weight 93 kg, SpO2 99 %. Physical Exam Constitutional:      Appearance: She is well-developed.  HENT:     Head: Normocephalic and atraumatic.  Neck:     Vascular: No JVD.  Cardiovascular:     Rate and Rhythm: Normal rate and regular rhythm.     Heart sounds: Murmur heard.   Systolic (Soft systolic murmur noted no S3 gallop) murmur is present.   Pulmonary:     Breath sounds: Normal breath sounds. No wheezing, rhonchi or rales.  Musculoskeletal:     Cervical back: Normal range of motion and neck supple.     Right lower leg: No tenderness. No edema.     Left lower leg: No tenderness. No edema.  Skin:    General: Skin is warm and dry.  Neurological:     General: No focal deficit present.     Mental Status: She is alert.      Assessment/Plan New onset angina rule out MI With some features atypical for angina. Hypertension Hyperlipidemia Adrenal insufficiency GERD Depression History of CVA History of peptic ulcer disease Hypothyroidism History of  atrial myxoma status post resection in 1978 Plan as per orders  Charolette Forward, MD 12/04/2020, 5:20 PM

## 2020-12-04 NOTE — ED Triage Notes (Signed)
Pt bib [facility] with chest pain at 0800 this morning with radiation down L arm after helping lift her husband. Received 324 mg ASA. Denies n/v.

## 2020-12-04 NOTE — Progress Notes (Signed)
[place] for Heparin Indication: chest pain/ACS  Allergies  Allergen Reactions  . Aspirin Other (See Comments)    Can take baby aspirin (heart flutters with high doses)  . Ibuprofen [Ibuprofen] Nausea And Vomiting  . Penicillins Nausea And Vomiting    Has patient had a PCN reaction causing immediate rash, facial/tongue/throat swelling, SOB or lightheadedness with hypotension: Yes Has patient had a PCN reaction causing severe rash involving mucus membranes or skin necrosis: No Has patient had a PCN reaction that required hospitalization No Has patient had a PCN reaction occurring within the last 10 years: No If all of the above answers are "NO", then may proceed with Cephalosporin use.     Patient Measurements: Height: 5' (152.4 cm) Weight: 93 kg (205 lb) IBW/kg (Calculated) : 45.5 Heparin Dosing Weight: 67.7 kg  Vital Signs: Temp: 98.6 F (37 C) (03/26 1410) Temp Source: Oral (03/26 1410) BP: 106/72 (03/26 1530) Pulse Rate: 78 (03/26 1530)  Labs: Recent Labs    12/04/20 1500  HGB 14.3  HCT 44.3  PLT 342  CREATININE 0.92  TROPONINIHS 3    Estimated Creatinine Clearance: 64.6 mL/min (by C-G formula based on SCr of 0.92 mg/dL).   Medical History: Past Medical History:  Diagnosis Date  . 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 Amblyopia   . Anemia   . Arthritis    "in my lower back"  . Asthma   . Atrial myxoma   . Blindness of left eye 1978  . Blood transfusion   . Breast cyst   . Constipation   . 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 CVA (cerebral infarction)   . Depression   . Depression   . 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[place])   . Hemorrhoids   . Hyperlipemia   . Hypothyroidism    per office note dated 03/25/2014  . Migraine   . Peptic ulcer associated with Helicobacter pylori infection   . Reflux   . Sinus headache   . Stomach ulcer    "from the hydrocortisone"  . Stroke [facility]) 1978   "piece tumor broke off & went to left eye;  leaving me blind"  . Thyroid disease    pt unsure if hyper or hypo???    Medications:  Scheduled:  . heparin  4,000 Units Intravenous Once  . nitroGLYCERIN  0.5 inch Topical Once  . sodium chloride flush  3 mL Intravenous Once    Assessment: Patient is a 26 yof that is being admitted for chest pain. Trop is normal at this time. Pharmacy has been asked to dose heparin at this time for ACS/cp. Goal of Therapy:  Heparin level 0.3-0.7 units/ml Monitor platelets by anticoagulation protocol: Yes   Plan:  - Heparin bolus 4000 units IV x 1 dose - Heparin drip @ 800 units/hr - Heparin level in ~ 6 hours  - Monitor patient for s/s of bleeding and CBC while on heparin   Duanne Limerick PharmD. BCPS  12/04/2020,4:38 PM

## 2020-12-04 NOTE — ED Provider Notes (Signed)
  Face-to-face evaluation   History: She presents for evaluation chest pain which she states woke her up from sleep this morning.  After a bit of time she called EMS who transferred her here.  She is given aspirin during transport, no other medications.  She states she has a cardiologist and has previously had a stress test but no cardiac interventions or diagnostic evaluation beyond a stress test.  She denies recent illnesses including fever, vomiting, dizziness or paresthesia.  She states she is taking her usual medication as directed  Physical exam: Alert, obese female who appears comfortable.  She states she has 9/10 chest pain at the time of evaluation.  Abdomen soft and nontender.  Heart regular rate and rhythm.  Normal pulse right radius.  Medical screening examination/treatment/procedure(s) were conducted as a shared visit with non-physician practitioner(s) and myself.  I personally evaluated the patient during the encounter    Daleen Bo, MD 12/04/20 [phone]

## 2020-12-04 NOTE — ED Notes (Signed)
Iv team  Here atempting an iv

## 2020-12-04 NOTE — ED Notes (Signed)
The pt is c/o chest pain 10/10  Asking for food and drink

## 2020-12-04 NOTE — ED Notes (Signed)
Report called to rn on 3e 

## 2020-12-04 NOTE — ED Provider Notes (Addendum)
[facility] EMERGENCY DEPARTMENT Provider Note   CSN: [id number] Arrival date & time: 12/04/20  1400     History Chief Complaint  Patient presents with  . Chest Pain    [name] is a 65 y.o. female.  HPI   65 year old female with a history of adrenal hypofunction, amblyopia, anemia, asthma, atrial myxoma, left eye blindness, Cushing syndrome, CVA, depression, glucocorticoid deficiency, hemorrhoids, hyperlipidemia, hypothyroidism, migraines, peptic ulcer associated H. pylori, GERD, thyroid disease who presents to the emergency department today for evaluation of chest pain.  States pain woke her up at 7:50 AM this morning.  Pain located to the mid/left side of the chest and radiates to the left upper extremity.  Does not radiate to the back or jaw.  Reports associated diaphoresis but denies any nausea, vomiting, shortness of breath.  Currently rates pain 9.5/10.  Denies exacerbating or alleviating factors.  Called EMS and received ASA prior to arrival.  Denies any other symptoms at this time.  Past Medical History:  Diagnosis Date  . 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 Amblyopia   . Anemia   . Arthritis    "in my lower back"  . Asthma   . Atrial myxoma   . Blindness of left eye 1978  . Blood transfusion   . Breast cyst   . Constipation   . 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 CVA (cerebral infarction)   . Depression   . Depression   . 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[place])   . Hemorrhoids   . Hyperlipemia   . Hypothyroidism    per office note dated 03/25/2014  . Migraine   . Peptic ulcer associated with Helicobacter pylori infection   . Reflux   . Sinus headache   . Stomach ulcer    "from the hydrocortisone"  . Stroke [facility]) 1978   "piece tumor broke off & went to left eye; leaving me blind"  . Thyroid disease    pt unsure if hyper or hypo???    Patient Active Problem List   Diagnosis Date Noted  . Chronic migraine without aura, with intractable migraine,  so stated, with status migrainosus 05/25/2020  . Frequent PVCs   . COVID-19 10/10/2019  . Hypoxia   . Migraine with aura and without status migrainosus, not intractable   . Tachycardia   . ARF (acute renal failure) ([place]) 12/06/2018  . Fever 12/06/2018  . Acute hypoxemic respiratory failure ([place]) 12/06/2018  . Headache 11/02/2013  . Dizziness 11/02/2013  . Anemia 01/10/2013  . Chest pain, unspecified 01/10/2013  . Adjustment disorder with mixed anxiety and depressed mood 01/10/2013  . Adrenal crisis ([place]) 01/07/2013  . Nausea & vomiting 08/03/2011  . Hypotension 08/03/2011  . Adrenal insufficiency ([place]) 08/03/2011  . Hyponatremia 08/03/2011  . Hypothyroidism 08/03/2011  . Depression 08/03/2011    Past Surgical History:  Procedure Laterality Date  . ADRENAL GLAND SURGERY  1982   on hydrocortisone  . ATRIAL MYXOMA EXCISION  1978  . BREAST BIOPSY  07/1999   ductal tissue excision & bx right; bx only left breast  . EYE SURGERY    . LEFT HEART CATHETERIZATION WITH CORONARY ANGIOGRAM N/A 10/06/2011   Procedure: LEFT HEART CATHETERIZATION WITH CORONARY ANGIOGRAM;  Surgeon: Clent Demark, MD;  Location: [facility] CATH LAB;  Service: Cardiovascular;  Laterality: N/A;  . repaired cross eyed     "as a child"  . ROTATOR CUFF REPAIR  05/2005   left  . VAGINAL HYSTERECTOMY  2000   "  partial; w/left ovary"     OB History   No obstetric history on file.     Family History  Problem Relation Age of Onset  . Colon cancer Sister 13  . Acromegaly Father   . Heart disease Father        Tumor in the heart  . Stomach cancer Father   . Diabetes Sister   . Kidney disease Sister        x2  . Heart disease Sister        Tumor in the heart  . Heart disease Daughter        Tumor in the heart  . Colon polyps Neg Hx   . Esophageal cancer Neg Hx   . Gallbladder disease Neg Hx   . Migraines Neg Hx     Social History   Tobacco Use  . Smoking status: Never Smoker  . Smokeless tobacco: Never  Used  Vaping Use  . Vaping Use: Never used  Substance Use Topics  . Alcohol use: No  . Drug use: No    Home Medications Prior to Admission medications   Medication Sig Start Date End Date Taking? Authorizing Provider  aspirin EC 81 MG tablet Take 81 mg by mouth daily. Swallow whole.    [provider]  Cobalamin Combinations (NEURIVA PLUS PO) Take 1 capsule by mouth daily.    [provider]  fenofibrate (TRICOR) 48 MG tablet Take 1 tablet (48 mg total) by mouth daily. 11/25/20   Vevelyn Francois, NP  Galcanezumab-gnlm (EMGALITY) 120 MG/ML SOAJ Inject 120 mg into the skin every 30 (thirty) days. 05/25/20   Melvenia Beam, MD  hydrocortisone (CORTEF) 20 MG tablet Take 0.5-1 tablets (10-20 mg total) by mouth 2 (two) times daily. Take 1 tablet every morning and 1/2 tablet every evening Patient taking differently: Take 20 mg by mouth See admin instructions. Take 20 mg by mouth with a meal and 10 mg at bedtime 2/50/53   Delora Fuel, MD  levothyroxine (SYNTHROID) 75 MCG tablet Take 1 tablet (75 mcg total) by mouth daily. 11/25/20 11/25/21  Vevelyn Francois, NP  Melatonin 10 MG TABS Take 1 tablet by mouth at bedtime. 11/08/20 11/08/21  Vevelyn Francois, NP  metoprolol tartrate (LOPRESSOR) 25 MG tablet Take 1 tablet (25 mg total) by mouth daily. 12/09/18   Danford, Suann Larry, MD  omega-3 acid ethyl esters (LOVAZA) 1 g capsule Take 1 capsule (1 g total) by mouth 2 (two) times daily. 11/25/20 11/25/21  Vevelyn Francois, NP  omeprazole (PRILOSEC) 20 MG capsule Take 20 mg by mouth 2 (two) times daily. 11/15/20   [provider]  Rimegepant Sulfate (NURTEC) 75 MG TBDP Take 75 mg by mouth daily as needed. For migraines. Take as close to onset of migraine as possible. One daily maximum. 05/25/20   Melvenia Beam, MD  rosuvastatin (CRESTOR) 10 MG tablet Take 1 tablet (10 mg total) by mouth daily. 11/25/20 11/25/21  Vevelyn Francois, NP  venlafaxine XR (EFFEXOR-XR) 150 MG 24 hr capsule Take 1  capsule (150 mg total) by mouth daily. 11/25/20 11/25/21  Vevelyn Francois, NP    Allergies    Aspirin, Ibuprofen [ibuprofen], and Penicillins  Review of Systems   Review of Systems  Constitutional: Positive for diaphoresis. Negative for fever.  HENT: Negative for ear pain and sore throat.   Eyes: Negative for pain and visual disturbance.  Respiratory: Negative for cough and shortness of breath.   Cardiovascular:  Positive for chest pain.  Gastrointestinal: Negative for abdominal pain, constipation, diarrhea, nausea and vomiting.  Genitourinary: Negative for dysuria and hematuria.  Musculoskeletal: Negative for back pain.  Skin: Negative for rash.  Neurological: Negative for headaches.  All other systems reviewed and are negative.   Physical Exam Updated Vital Signs BP 106/72   Pulse 78   Temp 98.6 F (37 C) (Oral)   Resp (!) 24   Ht 5' (1.524 m)   Wt 93 kg   SpO2 99%   BMI 40.04 kg/m   Physical Exam Vitals and nursing note reviewed.  Constitutional:      General: She is not in acute distress.    Appearance: She is well-developed.  HENT:     Head: Normocephalic and atraumatic.  Eyes:     Conjunctiva/sclera: Conjunctivae normal.  Cardiovascular:     Rate and Rhythm: Normal rate and regular rhythm.     Pulses:          Radial pulses are 2+ on the right side.       Posterior tibial pulses are 2+ on the right side and 2+ on the left side.     Heart sounds: No murmur heard.   Pulmonary:     Effort: Pulmonary effort is normal. No respiratory distress.     Breath sounds: Normal breath sounds. No decreased breath sounds, wheezing, rhonchi or rales.  Abdominal:     General: Bowel sounds are normal.     Palpations: Abdomen is soft.     Tenderness: There is no abdominal tenderness. There is no guarding or rebound.  Musculoskeletal:     Cervical back: Neck supple.     Right lower leg: No edema.     Left lower leg: No edema.  Skin:    General: Skin is warm and dry.   Neurological:     Mental Status: She is alert.     ED Results / Procedures / Treatments   Labs (all labs ordered are listed, but only abnormal results are displayed) Labs Reviewed  BASIC METABOLIC PANEL - Abnormal; Notable for the following components:      Result Value   Glucose, Bld 102 (*)    All other components within normal limits  CBC  TROPONIN I (HIGH SENSITIVITY)  TROPONIN I (HIGH SENSITIVITY)    EKG EKG Interpretation  Date/Time:  Saturday December 04 2020 14:03:07 EDT Ventricular Rate:  79 PR Interval:    QRS Duration: 77 QT Interval:  456 QTC Calculation: 523 R Axis:   58 Text Interpretation: Sinus rhythm Nonspecific T abnormalities, anterior leads Prolonged QT interval Since last tracing QT has lengthened and T wave abnormality is new Otherwise no significant change Confirmed by Daleen Bo [phone]) on 12/04/2020 2:28:52 PM   Radiology DG Chest 2 View  Result Date: 12/04/2020 CLINICAL DATA:  Chest pain radiating down LEFT arm beginning this morning, history of stroke, Cushing syndrome EXAM: CHEST - 2 VIEW COMPARISON:  09/07/2020 FINDINGS: Epicardial pacing leads present. Normal heart size post median sternotomy. Mediastinal contours and pulmonary vascularity normal. Lungs clear. Mild chronic central peribronchial thickening. No infiltrate, pleural effusion or pneumothorax. Bones demineralized. IMPRESSION: Mild bronchitic changes without infiltrate. Electronically Signed   By: Lavonia Dana M.D.   On: 12/04/2020 14:37    Procedures Procedures   CRITICAL CARE Performed by: Rodney Booze   Total critical care time: 33 minutes  Critical care time was exclusive of separately billable procedures and treating other patients.  Critical care was  necessary to treat or prevent imminent or life-threatening deterioration.  Critical care was time spent personally by me on the following activities: development of treatment plan with patient and/or surrogate as well as  nursing, discussions with consultants, evaluation of patient's response to treatment, examination of patient, obtaining history from patient or surrogate, ordering and performing treatments and interventions, ordering and review of laboratory studies, ordering and review of radiographic studies, pulse oximetry and re-evaluation of patient's condition.   Medications Ordered in ED Medications  sodium chloride flush (NS) 0.9 % injection 3 mL (3 mLs Intravenous Not Given 12/04/20 1429)  nitroGLYCERIN (NITROGLYN) 2 % ointment 0.5 inch (has no administration in time range)    ED Course  I have reviewed the triage vital signs and the nursing notes.  Pertinent labs & imaging results that were available during my care of the patient were reviewed by me and considered in my medical decision making (see chart for details).    MDM Rules/Calculators/A&P                          65 y/o F presenting to the ED today for eval of chest pain  Reviewed/interpreted labs CBC unremarkable BMP unremarkable Trop neg, delta trop pending at the time of admission   EKG - Sinus rhythm Nonspecific T abnormalities, anterior leads Prolonged QT interval Since last tracing QT has lengthened and T wave abnormality is new Otherwise no significant change  CXR - Mild bronchitic changes without infiltrate.  4:27 PM CONSULT with Dr. Terrence Dupont who recommends heparin, ntg paste, and admit. He will admit the patient.   Final Clinical Impression(s) / ED Diagnoses Final diagnoses:  Chest pain, unspecified type    Rx / DC Orders ED Discharge Orders    None       Rodney Booze, PA-C 12/04/20 1629    Couture, Cortni S, PA-C 12/04/20 1629    Daleen Bo, MD 12/04/20 [phone]

## 2020-12-05 ENCOUNTER — Encounter (HOSPITAL_COMMUNITY): Payer: Self-pay | Admitting: Cardiology

## 2020-12-05 LAB — LIPID PANEL
Cholesterol: 99 mg/dL (ref 0–200)
HDL: 37 mg/dL — ABNORMAL LOW (ref >40)
LDL Cholesterol: 40 mg/dL (ref 0–99)
Total CHOL/HDL Ratio: 2.7 RATIO
Triglycerides: 111 mg/dL (ref <150)
VLDL: 22 mg/dL (ref 0–40)

## 2020-12-05 LAB — BASIC METABOLIC PANEL
Anion gap: 9 (ref 5–15)
BUN: 13 mg/dL (ref 8–23)
CO2: 25 mmol/L (ref 22–32)
Calcium: 9.3 mg/dL (ref 8.9–10.3)
Chloride: 100 mmol/L (ref 98–111)
Creatinine, Ser: 1.01 mg/dL — ABNORMAL HIGH (ref 0.44–1.00)
GFR, Estimated: >60 mL/min (ref >60)
Glucose, Bld: 98 mg/dL (ref 70–99)
Potassium: 4.3 mmol/L (ref 3.5–5.1)
Sodium: 134 mmol/L — ABNORMAL LOW (ref 135–145)

## 2020-12-05 LAB — CBC
HCT: 38.8 % (ref 36.0–46.0)
Hemoglobin: 12.8 g/dL (ref 12.0–15.0)
MCH: 30 pg (ref 26.0–34.0)
MCHC: 33 g/dL (ref 30.0–36.0)
MCV: 90.9 fL (ref 80.0–100.0)
Platelets: 336 10*3/uL (ref 150–400)
RBC: 4.27 MIL/uL (ref 3.87–5.11)
RDW: 13.5 % (ref 11.5–15.5)
WBC: 9.8 10*3/uL (ref 4.0–10.5)
nRBC: 0 % (ref 0.0–0.2)

## 2020-12-05 LAB — HEPARIN LEVEL (UNFRACTIONATED)
Heparin Unfractionated: <0.1 IU/mL — ABNORMAL LOW (ref 0.30–0.70)
Heparin Unfractionated: <0.1 IU/mL — ABNORMAL LOW (ref 0.30–0.70)

## 2020-12-05 LAB — SARS CORONAVIRUS 2 (TAT 6-24 HRS): SARS Coronavirus 2: POSITIVE — AB

## 2020-12-05 MED ORDER — NITROGLYCERIN 0.4 MG SL SUBL: 0.4000 mg | SUBLINGUAL_TABLET | SUBLINGUAL | 12 refills | Status: DC | PRN

## 2020-12-05 MED ORDER — HEPARIN BOLUS VIA INFUSION
2000.0000 [IU] | Freq: Once | INTRAVENOUS | Status: AC
  Administered 2020-12-05: 2000 [IU] via INTRAVENOUS
  Filled 2020-12-05: qty 2000

## 2020-12-05 NOTE — Discharge Instructions (Signed)
Angina  Angina is discomfort in the chest, neck, arm, jaw, or back. The discomfort is caused by a lack of blood in the middle layer of the heart wall (myocardium). There are four types of angina:  Stable angina. This is triggered by vigorous activity or exercise. It goes away when you rest or take medicines that treat angina. This is diagnosed if you have had the symptom for more than 2 months.  Unstable angina. This is a warning sign and can lead to a heart attack. This is a medical emergency. Symptoms come at rest and last a long time.  Microvascular angina. This affects the small coronary arteries. Symptoms include chest pain, feeling tired, and being short of breath. The symptoms can last a long time or short time.  Prinzmetal or variant angina. This is caused by a spasm of the arteries that go to your heart. What are the causes? This condition is usually caused by atherosclerosis. This is the buildup of fat and cholesterol (plaque) in your arteries. The plaque may narrow or block the artery. Other causes of angina include:  Sudden spasms of the muscles of the arteries in the heart.  Small artery disease (microvascular dysfunction).  Problems with any of your heart valves.  A tear in an artery in your heart (coronary artery dissection).  Weakness of the heart muscle (cardiomyopathy). What increases the risk? You are more likely to develop this condition if you have:  High cholesterol.  High blood pressure.  Diabetes.  A family history of heart disease.  A sedentary lifestyle, or a lifestyle in which you do not exercise enough.  Depression.  Had radiation treatment to the left side of your chest. Other risk factors include:  Using tobacco.  Being obese.  Eating a diet high in saturated fats.  Being exposed to high stress or triggers of stress.  Using drugs, such as cocaine. Women have a greater risk for angina if they:  Are older than age 55.  Have gone  through menopause. What are the signs or symptoms? Common symptoms of this condition in both men and women may include:  Chest pain, which may: ? Feel like a crushing or squeezing in the chest, or a tightness, pressure, fullness, or heaviness in the chest. ? Last for more than a few minutes, or stop and come back over a few minutes.  Pain in the neck, arm, jaw, or back.  Unexplained heartburn or indigestion.  Shortness of breath.  Nausea.  Sudden cold sweats. Women and people with diabetes may have unusual (atypical) symptoms, such as:  Fatigue.  Unexplained feelings of nervousness or anxiety.  Unexplained weakness.  Dizziness or fainting. How is this diagnosed? This condition may be diagnosed based on:  Your symptoms and medical history.  Electrocardiogram (ECG) to measure the electrical activity in your heart.  Blood tests.  Stress test to look for signs of blockage when your heart is stressed.  CT angiogram to examine your heart and the blood flow to it.  Coronary angiogram to check for arterial blockage.  Echocardiogram (ultrasound) to assess the strength of your heartbeat. How is this treated? Angina may be treated with:  Medicines to: ? Prevent blood clots and heart attack. ? Relax blood vessels and improve blood flow to the heart. ? Reduce blood pressure, improve heart pumping, and relax blood vessels spasms. ? Reduce cholesterol and help treat atherosclerosis.  A procedure to widen a narrowed or blocked coronary artery (angioplasty). A mesh tube (stent) may   be placed in a coronary artery to keep it open.  Surgery to allow blood to go around a blocked artery (coronary artery bypass surgery). Follow these instructions at home: Medicines  Take over-the-counter and prescription medicines only as told by your health care provider.  Do not take the following medicines unless your health care provider approves: ? NSAIDs, such as ibuprofen or  naproxen. ? Vitamin supplements that contain vitamin A, vitamin E, or both. ? Hormone replacement therapy that contains estrogen with or without progestin. Eating and drinking  Eat a heart-healthy diet. This includes plenty of fresh fruits and vegetables, whole grains, low-fat (lean) protein, and low-fat dairy products.  Follow instructions from your health care provider about eating or drinking restrictions.   Activity  Follow an exercise program approved by your health care provider.  Consider joining a cardiac rehabilitation program.  Take a break when you feel fatigued. Plan rest periods in your daily activities. Lifestyle  Do not use any products that contain nicotine or tobacco. These products include cigarettes, chewing tobacco, and vaping devices, such as e-cigarettes. If you need help quitting, ask your health care provider.  If your health care provider says you can drink alcohol: ? Limit how much you have to:  0-1 drink a day for women who are not pregnant.  0-2 drinks a day for men. ? Be aware of how much alcohol is in your drink. In the [place] one drink equals one 12 oz bottle of beer (355 mL), one 5 oz glass of wine (148 mL), or one 1 oz glass of hard liquor (44 mL).   General instructions  Maintain a healthy weight.  Learn to manage stress.  Keep your vaccinations up to date. Get the flu (influenza) vaccine every year.  Talk to your health care provider if you feel depressed. Take a depression screening test to see if you are at risk for depression.  Work with your health care provider to manage other health conditions, such as hypertension or diabetes.  Keep all follow-up visits. This is important. Get help right away if:  You have pain in your chest, neck, arm, jaw, or back, and the pain: ? Lasts more than a few minutes. ? Is recurring. ? Is not relieved by taking medicines under the tongue (sublingual nitroglycerin). ? Increases in intensity or  frequency.  You have a lot of sweating without cause.  You have unexplained: ? Heartburn or indigestion. ? Shortness of breath or difficulty breathing. ? Nausea or vomiting. ? Fatigue. ? Feelings of nervousness or anxiety. ? Weakness.  You have sudden light-headedness or dizziness.  You faint. These symptoms may represent a serious problem that is an emergency. Do not wait to see if the symptoms will go away. Get medical help right away. Call your local emergency services (911 in the [place] Do not drive yourself to the hospital. Summary  Angina is discomfort in the chest, neck, arm, jaw, or back that is caused by a lack of blood in the arteries of the heart wall.  There are many symptoms of angina. They include chest pain, unexplained heartburn or indigestion, sudden cold sweats, and fatigue.  Angina may be treated with lifestyle changes, medicines, or surgery.  Symptoms of angina may represent an emergency. Get medical help right away. Call your local emergency services (911 in the [place] Do not drive yourself to the hospital. This information is not intended to replace advice given to you by your health care provider. Make sure you   discuss any questions you have with your health care provider. Document Revised: 02/20/2020 Document Reviewed: 02/20/2020 Elsevier Patient Education  2021 Elsevier Inc.  

## 2020-12-05 NOTE — Progress Notes (Signed)
Patient complaining of chest pain 7/10, alert, smiling, talkative and laughing.  Vital signs stable, not diaphoretic, does not appear to be in acute distress.  Reported to cardiologist rounding on floor, Dr. Prudencio Burly.  12 lead EKG performed and reviewed by MD, no acute changes noted by MD.  Troponins negative so far.  Will continue with ordered medications and plan of care, and monitor closely.

## 2020-12-05 NOTE — Progress Notes (Signed)
  [name]  GNF:[id number]  DOB: 1956-05-09  DOA: 12/04/2020  PCP: Tresa Garter, MD   Requesting physician: Terrence Dupont - cardiology  Reason for consultation: Admitted with CP, mild EKG changes.  COVID positive.   History of Present Illness: [name] is an 65 y.o. female with PMH of hypothyroidism; HLD; Cushing's syndrome with adrenal insufficiency; and L eye blindness who presented yesterday with CP.  She tested positive for COVID-19 on 3/27.  She previously tested positive on 12/28 and so is within the 3 month window where repeat testing is actually not recommended.  She is asymptomatic and here with concern for ACS.  Additional evaluation/treatment does not appear to be indicated regarding COVID at this time and she does not appear to need precautions at this time.   Carlyon Shadow, M.D.

## 2020-12-05 NOTE — Progress Notes (Signed)
Dear Doctor: This patient has been identified as a candidate for PICC line for the following reason (s): multiple (2) PIVs in less than 24 hours. If you agree, please write an order for the indicated device.   Thank you for supporting the early vascular access assessment program.

## 2020-12-05 NOTE — Progress Notes (Signed)
[place] for Heparin Indication: chest pain/ACS  Allergies  Allergen Reactions  . Aspirin Other (See Comments)    Can take baby aspirin (heart flutters with high doses)  . Ibuprofen [Ibuprofen] Nausea And Vomiting  . Penicillins Nausea And Vomiting    Has patient had a PCN reaction causing immediate rash, facial/tongue/throat swelling, SOB or lightheadedness with hypotension: Yes Has patient had a PCN reaction causing severe rash involving mucus membranes or skin necrosis: No Has patient had a PCN reaction that required hospitalization No Has patient had a PCN reaction occurring within the last 10 years: No If all of the above answers are "NO", then may proceed with Cephalosporin use.     Patient Measurements: Height: 5' (152.4 cm) Weight: 90.7 kg (199 lb 14.4 oz) IBW/kg (Calculated) : 45.5 Heparin Dosing Weight: 67.7 kg  Vital Signs: Temp: 98.1 F (36.7 C) (03/27 0347) Temp Source: Oral (03/27 0347) BP: 118/66 (03/27 0347) Pulse Rate: 88 (03/27 0347)  Labs: Recent Labs    12/04/20 1500 12/04/20 1842 12/04/20 1949 12/04/20 2141 12/04/20 2239 12/05/20 0251 12/05/20 0409 12/05/20 0803  HGB 14.3  --   --   --   --  12.8  --   --   HCT 44.3  --   --   --   --  38.8  --   --   PLT 342  --   --   --   --  336  --   --   HEPARINUNFRC  --   --   --   --  <0.10*  --  <0.10* <0.10*  CREATININE 0.92  --   --   --   --  1.01*  --   --   TROPONINIHS 3 <2 <2 <2  --   --   --   --     Estimated Creatinine Clearance: 58 mL/min (A) (by C-G formula based on SCr of 1.01 mg/dL (H)).  Assessment: 65 y.o. female with chest pain for heparin. No AC PTA. Pharmacy has been consulted to dose heparin.  Heparin level continues to be subtherapeutic at <0.10 on 1100 units/hr despite re-bolus and rate increase. CBC stable/WNL. Per RN, many issues with infusion overnight due to pain at IV site. IV has been replaced and infusion has been running without issue  for 3-4 hours.    Goal of Therapy:  Heparin level 0.3-0.7 units/ml Monitor platelets by anticoagulation protocol: Yes   Plan:  Heparin 2000 units IV bolus, continue heparin 1100 units/hr Heparin level in 6-8 hours Daily heparin level, CBC  Romilda Garret, PharmD PGY1 Acute Care Pharmacy Resident 12/05/2020 9:01 AM  Please check [email or web address] for unit specific pharmacy phone numbers.

## 2020-12-05 NOTE — Plan of Care (Signed)

## 2020-12-05 NOTE — Discharge Summary (Signed)
Discharge summary dictated on 12/05/2020 dictation number is [id number]

## 2020-12-05 NOTE — Progress Notes (Signed)
[place] for Heparin Indication: chest pain/ACS  Allergies  Allergen Reactions  . Aspirin Other (See Comments)    Can take baby aspirin (heart flutters with high doses)  . Ibuprofen [Ibuprofen] Nausea And Vomiting  . Penicillins Nausea And Vomiting    Has patient had a PCN reaction causing immediate rash, facial/tongue/throat swelling, SOB or lightheadedness with hypotension: Yes Has patient had a PCN reaction causing severe rash involving mucus membranes or skin necrosis: No Has patient had a PCN reaction that required hospitalization No Has patient had a PCN reaction occurring within the last 10 years: No If all of the above answers are "NO", then may proceed with Cephalosporin use.     Patient Measurements: Height: 5' (152.4 cm) Weight: 93 kg (205 lb) IBW/kg (Calculated) : 45.5 Heparin Dosing Weight: 67.7 kg  Vital Signs: Temp: 97.8 F (36.6 C) (03/26 1900) Temp Source: Oral (03/26 1900) BP: 105/66 (03/26 2005) Pulse Rate: 93 (03/26 2005)  Labs: Recent Labs    12/04/20 1500 12/04/20 1842 12/04/20 1949 12/04/20 2141 12/04/20 2239  HGB 14.3  --   --   --   --   HCT 44.3  --   --   --   --   PLT 342  --   --   --   --   HEPARINUNFRC  --   --   --   --  <0.10*  CREATININE 0.92  --   --   --   --   TROPONINIHS 3 <2 <2 <2  --     Estimated Creatinine Clearance: 64.6 mL/min (by C-G formula based on SCr of 0.92 mg/dL).  Assessment: 65 y.o. female with chest pain for heparin   Goal of Therapy:  Heparin level 0.3-0.7 units/ml Monitor platelets by anticoagulation protocol: Yes   Plan:  Heparin 2000 units IV bolus, then increase heparin 1100 units/hr Check heparin level in 8 hours.   Rya Rausch, Bronson Curb PharmD. BCPS  12/05/2020,12:02 AM

## 2020-12-06 ENCOUNTER — Telehealth: Payer: Self-pay | Admitting: *Deleted

## 2020-12-06 NOTE — Discharge Summary (Signed)
[name], [name] MEDICAL RECORD NO: [id number] ACCOUNT NO: [id number] DATE OF BIRTH: 02-Jul-1956 FACILITY: [facility] LOCATION: [facility] PHYSICIAN: Allegra Lai. Terrence Dupont, MD  Discharge Summary   DATE OF DISCHARGE: 12/05/2020   ADMITTING DIAGNOSES:   1.  New onset angina, rule out MI with some features, atypical for angina, rule out myocardial infarction. 2.  Hypertension. 3.  Hyperlipidemia. 4.  Adrenal insufficiency. 5.  Gastroesophageal reflux disease. 6.  Depression. 7.  History of cerebrovascular accident. 8.  History of peptic ulcer disease. 9.  Hypothyroidism. 10.  Migraine headache. 11.  History of atrial myxoma, status post resection in 1978.  DISCHARGE DIAGNOSES:   1.  Stable angina myocardial infarction ruled out.   2.  Personal history of COVID-19 infection approximately 3 months ago. 3.  COVID test antibody positive because of prior infection approximately 2-1/2 to 3 months ago.  The patient is asymptomatic. 4.  Hypertension. 5.  Hyperlipidemia. 6.  Adrenal insufficiency. 7.  Gastroesophageal reflux disease. 8.  Depression. 9.  History of cerebrovascular accident. 10.  History of peptic ulcer disease. 11.  Hypothyroidism. 12.  History of migraine headache. 13.  History of atrial myxoma, status post resection in 1978.   DISCHARGE MEDICATIONS:  Discharge home medications are the patient has been advised to continue same home medications, i.e. 1.  Aspirin 81 mg 1 tablet daily. 2.  Fenofibrate 48 mg daily. 3.  Levothyroxine 75 mcg daily. 4.  Melatonin 10 mg daily as needed. 5.  Metoprolol tartrate 25 mg 1 tablet twice daily. 6.  Neuriva plus 1 capsule daily. 7.  Omeprazole 40 mg daily. 8.  Lovaza 1 capsule two times daily. 9.  Crestor 10 mg daily. 10.  Effexor XR 1 capsule 150 mg daily. 11.  Hydrocortisone 20 mg in a.m. and half tablet in p.m. as before.  12.  Nurtec 75 mg daily as needed for migraine. 13.  Emgality 120 mg per mL every 30 days as before. 14.   Nitrostat 0.4 mg sublingual use as directed.  CONDITION AT DISCHARGE:  Stable.  FOLLOWUP:  With me in 1 week.  We will arrange for a nuclear stress test as outpatient.  The patient has been advised to call 911 if she gets anginal chest pain at home requiring sublingual nitro x3 without relief.  CONDITION AT DISCHARGE:  Stable.  BRIEF HISTORY AND HOSPITAL COURSE:  The patient is a 65 year old female with past medical history significant for hypertension, hyperlipidemia, hypothyroidism, adrenal insufficiency, on chronic steroids, blindness left eye, CVA, depression, history of  atrial myxoma, resection in 1978, GERD, history of peptic ulcer disease, migraine headaches, she came to the ER by EMS complaining of retrosternal chest pain described as pressure, rated at 8/10, radiating to left arm associated with diaphoresis, which  woke her up this morning.  The patient received aspirin with partial relief of chest pain.  States chest pain comes and goes, now occasionally increases with deep breathing.  The patient denies any cough, fever or chills or sore throat.  Denies any  nausea, vomiting.  Denies palpitation, lightheadedness or syncope.  Denies PND, orthopnea, leg swelling.  EKG done in the ED showed normal sinus rhythm with minor T-wave inversion in anterolateral leads.  High-sensitivity troponin I has been negative.   The patient denies any recent cardiac workup.  The patient also had of note COVID-19 infection approximately two to three months ago, her COVID test is positive, but the patient is asymptomatic.    PHYSICAL EXAMINATION: GENERAL:  She was  alert, awake, oriented x3.  Blood pressure is 106/72, pulse 78.  She was afebrile. HEENT:  Conjunctivae are pink. NECK:  Supple, no JVD, no bruit. LUNGS:  Clear to auscultation without rhonchi, or rales. CARDIOVASCULAR:  S1, S2 was normal.  There was soft systolic murmur.  No S3 gallop. ABDOMEN:  Soft, bowel sounds present, nontender. EXTREMITIES:   There is no clubbing, cyanosis or edema.  PERTINENT LABORATORY DATA:  Sodium was 137, potassium 4.5, BUN 16, creatinine 0.93, glucose was 122, fasting repeat blood sugar was 98.  Her four sets of high sensitivity troponin I were negative 3, less than 2, less than 2 and less than 2, hemoglobin  was 14.9, hematocrit 44.8, white count of 5.6.  Her cholesterol was 99, HDL 39, LDL was 40, triglycerides 111.  Her TSH was slightly low 0.436.  EKG showed normal sinus rhythm with minor T-wave inversion in anterolateral leads.  BRIEF HOSPITAL COURSE:  The patient was admitted to telemetry unit.  MI was ruled out by serial enzymes and EKG.  The patient did not have any episodes of chest pain during the hospital stay, the patient's COVID test was positive, although the patient is  asymptomatic, had COVID infection 3 months ago.  Stress test could not be done today.  Discussed with the patient at length regarding scheduling the nuclear stress test as outpatient to which the patient agrees.  Reviewed the old records from the  office.  Also, the patient had EKG done in the past, which showed similar T-wave inversion in the anterolateral leads as they are present now also, so there were no new acute EKG changes, also 4 sets of troponin I has been negative.  The patient has no  chest pain during the hospital stay.  DISCHARGE INSTRUCTIONS:  The patient will be discharged home on above medications and will be followed up in my office in one week.  We will arrange for a nuclear stress test as outpatient. In the interim if she gets recurrent chest pain the patient has  been advised to take nitro.  If pain does not get resolved after 3 sublingual nitro, should call EMS.   SUJ D: 12/05/2020 12:46:16 pm T: 12/06/2020 2:27:00 am  JOB: [id number]/ [id number]

## 2020-12-06 NOTE — Telephone Encounter (Signed)
Transition Care Management Unsuccessful Follow-up Telephone Call  Date of discharge and from where:  12/05/2020 [facility]  Attempts:  1st Attempt  Reason for unsuccessful TCM follow-up call:  Unable to leave message

## 2020-12-07 NOTE — Telephone Encounter (Signed)
Transition Care Management Unsuccessful Follow-up Telephone Call  Date of discharge and from where:  12/05/2020 [facility]  Attempts:  2nd Attempt  Reason for unsuccessful TCM follow-up call:  Unable to leave message

## 2020-12-08 NOTE — Telephone Encounter (Signed)
Transition Care Management Follow-up Telephone Call  Date of discharge and from where: 12/05/2020 from [facility]  How have you been since you were released from the hospital? Pt stated that she is feeling much better.   Any questions or concerns? No  Items Reviewed:  Did the pt receive and understand the discharge instructions provided? Yes   Medications obtained and verified? Yes   Other? No   Any new allergies since your discharge? No   Dietary orders reviewed? n/a  Do you have support at home? Yes   Functional Questionnaire: (I = Independent and D = Dependent) ADLs: I  Bathing/Dressing- I  Meal Prep- I  Eating- I  Maintaining continence- I  Transferring/Ambulation- I  Managing Meds- I  Follow up appointments reviewed:   PCP Hospital f/u appt confirmed? No    Specialist Hospital f/u appt confirmed? Yes  Scheduled to see Cardiology on 01/04/2021 @ 10:40am.  Are transportation arrangements needed? No   If their condition worsens, is the pt aware to call PCP or go to the Emergency Dept.? Yes  Was the patient provided with contact information for the PCP's office or ED? Yes  Was to pt encouraged to call back with questions or concerns? Yes

## 2021-01-04 ENCOUNTER — Ambulatory Visit (INDEPENDENT_AMBULATORY_CARE_PROVIDER_SITE_OTHER): Payer: Medicaid Other | Admitting: Internal Medicine

## 2021-01-04 ENCOUNTER — Other Ambulatory Visit: Payer: Self-pay

## 2021-01-04 ENCOUNTER — Telehealth: Payer: Self-pay

## 2021-01-04 ENCOUNTER — Encounter: Payer: Self-pay | Admitting: Internal Medicine

## 2021-01-04 ENCOUNTER — Encounter: Payer: Self-pay | Admitting: Radiology

## 2021-01-04 VITALS — BP 118/80 | HR 63 | Ht 60.0 in | Wt 210.0 lb

## 2021-01-04 DIAGNOSIS — Z86018 Personal history of other benign neoplasm: Secondary | ICD-10-CM | POA: Diagnosis not present

## 2021-01-04 DIAGNOSIS — R072 Precordial pain: Secondary | ICD-10-CM

## 2021-01-04 DIAGNOSIS — R002 Palpitations: Secondary | ICD-10-CM | POA: Diagnosis not present

## 2021-01-04 NOTE — Patient Instructions (Addendum)
Medication Instructions:  2 HOURS PRIOR TO CARDIAC CTA TAKE MORNING DOSE OF METOPROLOL TARTRATE, 25mg .   *If you need a refill on your cardiac medications before your next appointment, please call your pharmacy*   Lab Work: BMet 1 week prior to cardiac CTA.   If you have labs (blood work) drawn today and your tests are completely normal, you will receive your results only by: Marland Kitchen MyChart Message (if you have MyChart) OR . A paper copy in the mail If you have any lab test that is abnormal or we need to change your treatment, we will call you to review the results.   Testing/Procedures: Your physician has requested that you have an echocardiogram. Echocardiography is a painless test that uses sound waves to create images of your heart. It provides your doctor with information about the size and shape of your heart and how well your heart's chambers and valves are working. This procedure takes approximately one hour. There are no restrictions for this procedure.  Your physician has requested that you have cardiac CT. Cardiac computed tomography (CT) is a painless test that uses an x-ray machine to take clear, detailed pictures of your heart. For further information please visit [email or web address]. Please follow instruction sheet as given.  Your physician has recommended that you wear an event monitor. Event monitors are medical devices that record the heart's electrical activity. Doctors most often [place] these monitors to diagnose arrhythmias. Arrhythmias are problems with the speed or rhythm of the heartbeat. The monitor is a small, portable device. You can wear one while you do your normal daily activities. This is usually used to diagnose what is causing palpitations/syncope (passing out).   Follow-Up: At [facility], you and your health needs are our priority.  As part of our continuing mission to provide you with exceptional heart care, we have created designated Provider Care Teams.  These  Care Teams include your primary Cardiologist (physician) and Advanced Practice Providers (APPs -  Physician Assistants and Nurse Practitioners) who all work together to provide you with the care you need, when you need it.  We recommend signing up for the patient portal called "MyChart".  Sign up information is provided on this After Visit Summary.  MyChart is used to connect with patients for Virtual Visits (Telemedicine).  Patients are able to view lab/test results, encounter notes, upcoming appointments, etc.  Non-urgent messages can be sent to your provider as well.   To learn more about what you can do with MyChart, go to [email or web address].    Your next appointment:   6-8 week(s)  The format for your next appointment:   In Person or Virtual  Provider:   Cherlynn Kaiser, MD   Other Instructions Your cardiac CT will be scheduled at one of the below locations:   [facility] [address] [place], [place] [address] [phone]  If scheduled at [facility], please arrive at the [facility] main entrance (entrance A) of [facility] 30 minutes prior to test start time. Proceed to the [facility] Radiology Department (first floor) to check-in and test prep.   Please follow these instructions carefully (unless otherwise directed):  On the Night Before the Test: . Be sure to Drink plenty of water. . Do not consume any caffeinated/decaffeinated beverages or chocolate 12 hours prior to your test. . Do not take any antihistamines 12 hours prior to your test.  On the Day of the Test: . Drink plenty of water until  1 hour prior to the test. . Do not eat any food 4 hours prior to the test. . You may take your regular medications prior to the test.  . Take morning dose of metoprolol (Lopressor) 25mg  two hours prior to test. . HOLD Furosemide/Hydrochlorothiazide morning of the test. . FEMALES- please wear underwire-free bra if available       After  the Test: . Drink plenty of water. . After receiving IV contrast, you may experience a mild flushed feeling. This is normal. . On occasion, you may experience a mild rash up to 24 hours after the test. This is not dangerous. If this occurs, you can take Benadryl 25 mg and increase your fluid intake. . If you experience trouble breathing, this can be serious. If it is severe call 911 IMMEDIATELY. If it is mild, please call our office. . If you take any of these medications: Glipizide/Metformin, Avandament, Glucavance, please do not take 48 hours after completing test unless otherwise instructed.   Once we have confirmed authorization from your insurance company, we will call you to set up a date and time for your test. Based on how quickly your insurance processes prior authorizations requests, please allow up to 4 weeks to be contacted for scheduling your Cardiac CT appointment. Be advised that routine Cardiac CT appointments could be scheduled as many as 8 weeks after your provider has ordered it.  For non-scheduling related questions, please contact the cardiac imaging nurse navigator should you have any questions/concerns: Marchia Bond, Cardiac Imaging Nurse Navigator Gordy Clement, Cardiac Imaging Nurse Navigator [facility] Direct Office Dial: [phone]   For scheduling needs, including cancellations and rescheduling, please call [place], [phone].   [employer] Cardiac Event Monitor Instructions Your physician has requested you wear your cardiac event monitor for 30 days, (1-30). [employer] may call or text to confirm a shipping address. The monitor will be sent to a land address via UPS. [employer] will not ship a monitor to a PO BOX. It typically takes 3-5 days to receive your monitor after it has been enrolled. [employer] will assist with USPS tracking if your package is delayed. The telephone number for [employer] is [phone]. Once you have  received your monitor, please review the enclosed instructions. Instruction tutorials can also be viewed under help and settings on the enclosed cell phone. Your monitor has already been registered assigning a specific monitor serial # to you.  Applying the monitor Remove cell phone from case and turn it on. The cell phone works as Dealer and needs to be within [employer] of you at all times. The cell phone will need to be charged on a daily basis. We recommend you plug the cell phone into the enclosed charger at your bedside table every night.  Monitor batteries: You will receive two monitor batteries labelled #1 and #2. These are your recorders. Plug battery #2 onto the second connection on the enclosed charger. Keep one battery on the charger at all times. This will keep the monitor battery deactivated. It will also keep it fully charged for when you need to switch your monitor batteries. A small light will be blinking on the battery emblem when it is charging. The light on the battery emblem will remain on when the battery is fully charged.  Open package of a Monitor strip. Insert battery #1 into black hood on strip and gently squeeze monitor battery onto connection as indicated in instruction booklet. Set aside while preparing skin.  Choose location for your strip, vertical or horizontal, as indicated in the instruction booklet. Shave to remove all hair from location. There cannot be any lotions, oils, powders, or colognes on skin where monitor is to be applied. Wipe skin clean with enclosed Saline wipe. Dry skin completely.  Peel paper labeled #1 off the back of the Monitor strip exposing the adhesive. Place the monitor on the chest in the vertical or horizontal position shown in the instruction booklet. One arrow on the monitor strip must be pointing upward. Carefully remove paper labeled #2, attaching remainder of strip to your skin. Try not to create any folds or wrinkles in  the strip as you apply it.  Firmly press and release the circle in the center of the monitor battery. You will hear a small beep. This is turning the monitor battery on. The heart emblem on the monitor battery will light up every 5 seconds if the monitor battery in turned on and connected to the patient securely. Do not push and hold the circle down as this turns the monitor battery off. The cell phone will locate the monitor battery. A screen will appear on the cell phone checking the connection of your monitor strip. This may read poor connection initially but change to good connection within the next minute. Once your monitor accepts the connection you will hear a series of 3 beeps followed by a climbing crescendo of beeps. A screen will appear on the cell phone showing the two monitor strip placement options. Touch the picture that demonstrates where you applied the monitor strip.  Your monitor strip and battery are waterproof. You are able to shower, bathe, or swim with the monitor on. They just ask you do not submerge deeper than 3 feet underwater. We recommend removing the monitor if you are swimming in a lake, river, or ocean.  Your monitor battery will need to be switched to a fully charged monitor battery approximately once a week. The cell phone will alert you of an action which needs to be made.  On the cell phone, tap for details to reveal connection status, monitor battery status, and cell phone battery status. The green dots indicates your monitor is in good status. A red dot indicates there is something that needs your attention.  To record a symptom, click the circle on the monitor battery. In 30-60 seconds a list of symptoms will appear on the cell phone. Select your symptom and tap save. Your monitor will record a sustained or significant arrhythmia regardless of you clicking the button. Some patients do not feel the heart rhythm irregularities. [employer] will notify us  of any serious or critical events.  Refer to instruction booklet for instructions on switching batteries, changing strips, the Do not disturb or Pause features, or any additional questions.  Call [employer] at [phone], to confirm your monitor is transmitting and record your baseline. They will answer any questions you may have regarding the monitor instructions at that time.  Returning the monitor to [place] all equipment back into blue box. Peel off strip of paper to expose adhesive and close box securely. There is a prepaid UPS shipping label on this box. Drop in a UPS drop box, or at a UPS facility like [employer]. You may also contact [employer] to arrange UPS to pick up monitor package at your home.

## 2021-01-04 NOTE — Telephone Encounter (Signed)
Please call SON  [name] [phone]  Wanted to know why his mom was referred to Cardiologist

## 2021-01-04 NOTE — Telephone Encounter (Signed)
Called and spoke with patient son , notified him  pt her last ov , his mother was feeling tried and  tachycardia, this was an on going issues. Pt need to reevaluated , pt son was happy that he was sent there

## 2021-01-04 NOTE — Progress Notes (Signed)
Enrolled patient for a 30 day [employer] Event Monitor to be mailed to patients home  

## 2021-01-04 NOTE — Progress Notes (Signed)
Cardiology Office Note:    Date:  01/04/2021   ID:  [name], DOB July 19, 1956, MRN [id number]  PCP:  Tresa Garter, MD  Cardiologist:  No primary care provider on file.  Electrophysiologist:  None    Referring MD: Vevelyn Francois, NP   Chief Complaint/Reason for Referral: Palpitations, dizziness, chest pain  History of Present Illness:    [name] is a 65 y.o. 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  Recent hospitalization for chest pain radiating to the left arm concerning for cardiac chest pain. She ruled out for MI. Discharged with plan for outpatient follow up. She presents with her son who provides some additional history.   Irregular heart beats - after first episode of covid 1 year ago. Then again earlier this year. Dizzy and lightheaded with with palpitations. Racing heart. Same chest pain sensation with palpitations.   Admitted to the hospital in march ruled out for MI. Started nitro SL, about every 4 days she has to take nitro for chest pain and usually 1-2 will relieve chest pain. Feels like something sitting on chest. With rest or with exertion.    Past Medical History:  Diagnosis Date  . 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[place])   . Amblyopia   . Anemia   . Arthritis    "in my lower back"  . Asthma   . Atrial myxoma   . Blindness of left eye 1978  . Blood transfusion   . Breast cyst   . Constipation   . 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 CVA (cerebral infarction)   . Depression   . Depression   . 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[place])   . Hemorrhoids   . Hyperlipemia   . Hypothyroidism    per office note dated 03/25/2014  . Migraine   . Peptic ulcer associated with Helicobacter pylori infection   . Reflux   . Stroke [facility]) 1978   "piece tumor broke off & went to left  eye; leaving me blind"    Past Surgical History:  Procedure Laterality Date  . ADRENAL GLAND SURGERY  1982   on hydrocortisone  . ATRIAL MYXOMA EXCISION  1978  . BREAST BIOPSY  07/1999   ductal tissue excision & bx right; bx only left breast  . EYE SURGERY    . LEFT HEART CATHETERIZATION WITH CORONARY ANGIOGRAM N/A 10/06/2011   Procedure: LEFT HEART CATHETERIZATION WITH CORONARY ANGIOGRAM;  Surgeon: Clent Demark, MD;  [facility] C[facility] LAB;  Service: Cardiovascular;  Laterality: N/A;  . repaired cross eyed     "as a child"  . ROTATOR CUFF REPAIR  05/2005   left  . VAGINAL HYSTERECTOMY  2000   "partial; w/left ovary"    Current Medications: No outpatient medications have been marked as taking for the 01/04/21 encounter (Appointment) with Elouise Munroe, MD.     Allergies:   Aspirin, Ibuprofen [ibuprofen], and Penicillins   Social History   Tobacco Use  . Smoking status: Never Smoker  . Smokeless tobacco: Never Used  Vaping Use  . Vaping Use: Never used  Substance Use Topics  . Alcohol use: No  . Drug use: No     Family History: The patient's family history includes Acromegaly in her father; Colon cancer (age of onset: 58) in her sister; Diabetes in her sister; Heart disease in her  daughter, father, and sister; Kidney disease in her sister; Stomach cancer in her father. There is no history of Colon polyps, Esophageal cancer, Gallbladder disease, or Migraines.  ROS:   Please see the history of present illness.    All other systems reviewed and are negative.  EKGs/Labs/Other Studies Reviewed:    The following studies were reviewed today:  EKG:  NSR, nonspecific T wave abnormality.   Recent Labs: 09/07/2020: ALT 21 12/04/2020: Magnesium 1.9; TSH 0.186 12/05/2020: BUN 13; Creatinine, Ser 1.01; Hemoglobin 12.8; Platelets 336; Potassium 4.3; Sodium 134  Recent Lipid Panel    Component Value Date/Time   CHOL 99 12/05/2020 0251   CHOL 164 09/27/2018 1048   TRIG  111 12/05/2020 0251   HDL 37 (L) 12/05/2020 0251   HDL 50 09/27/2018 1048   CHOLHDL 2.7 12/05/2020 0251   VLDL 22 12/05/2020 0251   LDLCALC 40 12/05/2020 0251   LDLCALC 73 09/27/2018 1048    Physical Exam:    VS:  BP 118/80   Pulse 63   Ht 5' (1.524 m)   Wt 210 lb (95.3 kg)   SpO2 97%   BMI 41.01 kg/m     Wt Readings from Last 5 Encounters:  12/05/20 199 lb 14.4 oz (90.7 kg)  11/25/20 202 lb 3.2 oz (91.7 kg)  05/25/20 202 lb (91.6 kg)  04/01/20 (!) 209 lb 6.4 oz (95 kg)  03/26/20 175 lb (79.4 kg)    Constitutional: No acute distress Eyes: sclera non-icteric, normal conjunctiva and lids ENMT: normal dentition, moist mucous membranes Cardiovascular: irregular rhythm, normal rate, no murmurs. S1 and S2 normal. Radial pulses normal bilaterally. No jugular venous distention.  Respiratory: clear to auscultation bilaterally GI : normal bowel sounds, soft and nontender. No distention.   MSK: extremities warm, well perfused. No edema.  NEURO: grossly nonfocal exam, moves all extremities. PSYCH: alert and oriented x 3, normal mood and affect.   ASSESSMENT:    1. Precordial pain   2. Palpitations   3. History of atrial myxoma    PLAN:    Precordial pain Palpitations  - Plan: EKG [id number], Basic metabolic panel, CT CORONARY MORPH W/CTA COR, Cardiac event monitor - her symptoms represent typical angina, but also coorelate with palpitations. Will obtain both a cardiac monitor as well as a CCTA. This will help [place] also assess her atria post operatively.  History of atrial myxoma - Plan: ECHOCARDIOGRAM COMPLETE She has not recently had a follow echocardiogram for atrial myxoma, which can recur. No obstructive symptoms at this time, plan for echocardiogram to assess.   Total time of encounter: 45 minutes total time of encounter, including 30 minutes spent in face-to-face patient care on the date of this encounter. This time includes coordination of care and counseling regarding above  mentioned problem list. Remainder of non-face-to-face time involved reviewing chart documents/testing relevant to the patient encounter and documentation in the medical record. I have independently reviewed documentation from referring provider.   Cherlynn Kaiser, MD, [facility]   Medication Adjustments/Labs and Tests Ordered: Current medicines are reviewed at length with the patient today.  Concerns regarding medicines are outlined above.   Orders Placed This Encounter  Procedures  . CT CORONARY MORPH W/CTA COR W/SCORE W/CA W/CM &/OR WO/CM  . CT CORONARY FRACTIONAL FLOW RESERVE DATA PREP  . CT CORONARY FRACTIONAL FLOW RESERVE FLUID ANALYSIS  . Basic metabolic panel  . Cardiac event monitor  . EKG 12-Lead  . ECHOCARDIOGRAM COMPLETE  No orders of the defined types were placed in this encounter.   Patient Instructions  Medication Instructions:  2 HOURS PRIOR TO CARDIAC CTA TAKE MORNING DOSE OF METOPROLOL TARTRATE, 25mg .   *If you need a refill on your cardiac medications before your next appointment, please call your pharmacy*   Lab Work: BMet 1 week prior to cardiac CTA.   If you have labs (blood work) drawn today and your tests are completely normal, you will receive your results only by: Marland Kitchen MyChart Message (if you have MyChart) OR . A paper copy in the mail If you have any lab test that is abnormal or we need to change your treatment, we will call you to review the results.   Testing/Procedures: Your physician has requested that you have an echocardiogram. Echocardiography is a painless test that uses sound waves to create images of your heart. It provides your doctor with information about the size and shape of your heart and how well your heart's chambers and valves are working. This procedure takes approximately one hour. There are no restrictions for this procedure.  Your physician has requested that you have cardiac CT. Cardiac computed  tomography (CT) is a painless test that uses an x-ray machine to take clear, detailed pictures of your heart. For further information please visit [email or web address]. Please follow instruction sheet as given.  Your physician has recommended that you wear an event monitor. Event monitors are medical devices that record the heart's electrical activity. Doctors most often [place] these monitors to diagnose arrhythmias. Arrhythmias are problems with the speed or rhythm of the heartbeat. The monitor is a small, portable device. You can wear one while you do your normal daily activities. This is usually used to diagnose what is causing palpitations/syncope (passing out).   Follow-Up: At [facility], you and your health needs are our priority.  As part of our continuing mission to provide you with exceptional heart care, we have created designated Provider Care Teams.  These Care Teams include your primary Cardiologist (physician) and Advanced Practice Providers (APPs -  Physician Assistants and Nurse Practitioners) who all work together to provide you with the care you need, when you need it.  We recommend signing up for the patient portal called "MyChart".  Sign up information is provided on this After Visit Summary.  MyChart is used to connect with patients for Virtual Visits (Telemedicine).  Patients are able to view lab/test results, encounter notes, upcoming appointments, etc.  Non-urgent messages can be sent to your provider as well.   To learn more about what you can do with MyChart, go to [email or web address].    Your next appointment:   6-8 week(s)  The format for your next appointment:   In Person or Virtual  Provider:   Cherlynn Kaiser, MD   Other Instructions Your cardiac CT will be scheduled at one of the below locations:   [facility] [address] [place], [place] [address] [phone]  If scheduled at [facility], please arrive at the [facility] main  entrance (entrance A) of [facility] 30 minutes prior to test start time. Proceed to the [facility] Radiology Department (first floor) to check-in and test prep.   Please follow these instructions carefully (unless otherwise directed):  On the Night Before the Test: . Be sure to Drink plenty of water. . Do not consume any caffeinated/decaffeinated beverages or chocolate 12 hours prior to your test. . Do not take any antihistamines 12 hours  prior to your test.  On the Day of the Test: . Drink plenty of water until 1 hour prior to the test. . Do not eat any food 4 hours prior to the test. . You may take your regular medications prior to the test.  . Take morning dose of metoprolol (Lopressor) 25mg  two hours prior to test. . HOLD Furosemide/Hydrochlorothiazide morning of the test. . FEMALES- please wear underwire-free bra if available       After the Test: . Drink plenty of water. . After receiving IV contrast, you may experience a mild flushed feeling. This is normal. . On occasion, you may experience a mild rash up to 24 hours after the test. This is not dangerous. If this occurs, you can take Benadryl 25 mg and increase your fluid intake. . If you experience trouble breathing, this can be serious. If it is severe call 911 IMMEDIATELY. If it is mild, please call our office. . If you take any of these medications: Glipizide/Metformin, Avandament, Glucavance, please do not take 48 hours after completing test unless otherwise instructed.   Once we have confirmed authorization from your insurance company, we will call you to set up a date and time for your test. Based on how quickly your insurance processes prior authorizations requests, please allow up to 4 weeks to be contacted for scheduling your Cardiac CT appointment. Be advised that routine Cardiac CT appointments could be scheduled as many as 8 weeks after your provider has ordered it.  For non-scheduling related questions,  please contact the cardiac imaging nurse navigator should you have any questions/concerns: Marchia Bond, Cardiac Imaging Nurse Navigator Gordy Clement, Cardiac Imaging Nurse Navigator [facility] Direct Office Dial: [phone]   For scheduling needs, including cancellations and rescheduling, please call [place], [phone].   [employer] Cardiac Event Monitor Instructions Your physician has requested you wear your cardiac event monitor for 30 days, (1-30). [employer] may call or text to confirm a shipping address. The monitor will be sent to a land address via UPS. [employer] will not ship a monitor to a PO BOX. It typically takes 3-5 days to receive your monitor after it has been enrolled. [employer] will assist with USPS tracking if your package is delayed. The telephone number for [employer] is [phone]. Once you have received your monitor, please review the enclosed instructions. Instruction tutorials can also be viewed under help and settings on the enclosed cell phone. Your monitor has already been registered assigning a specific monitor serial # to you.  Applying the monitor Remove cell phone from case and turn it on. The cell phone works as Dealer and needs to be within [employer] of you at all times. The cell phone will need to be charged on a daily basis. We recommend you plug the cell phone into the enclosed charger at your bedside table every night.  Monitor batteries: You will receive two monitor batteries labelled #1 and #2. These are your recorders. Plug battery #2 onto the second connection on the enclosed charger. Keep one battery on the charger at all times. This will keep the monitor battery deactivated. It will also keep it fully charged for when you need to switch your monitor batteries. A small light will be blinking on the battery emblem when it is charging. The light on the battery emblem will remain on when the battery  is fully charged.  Open package of a Monitor strip. Insert battery #1 into black hood on strip  and gently squeeze monitor battery onto connection as indicated in instruction booklet. Set aside while preparing skin.  Choose location for your strip, vertical or horizontal, as indicated in the instruction booklet. Shave to remove all hair from location. There cannot be any lotions, oils, powders, or colognes on skin where monitor is to be applied. Wipe skin clean with enclosed Saline wipe. Dry skin completely.  Peel paper labeled #1 off the back of the Monitor strip exposing the adhesive. Place the monitor on the chest in the vertical or horizontal position shown in the instruction booklet. One arrow on the monitor strip must be pointing upward. Carefully remove paper labeled #2, attaching remainder of strip to your skin. Try not to create any folds or wrinkles in the strip as you apply it.  Firmly press and release the circle in the center of the monitor battery. You will hear a small beep. This is turning the monitor battery on. The heart emblem on the monitor battery will light up every 5 seconds if the monitor battery in turned on and connected to the patient securely. Do not push and hold the circle down as this turns the monitor battery off. The cell phone will locate the monitor battery. A screen will appear on the cell phone checking the connection of your monitor strip. This may read poor connection initially but change to good connection within the next minute. Once your monitor accepts the connection you will hear a series of 3 beeps followed by a climbing crescendo of beeps. A screen will appear on the cell phone showing the two monitor strip placement options. Touch the picture that demonstrates where you applied the monitor strip.  Your monitor strip and battery are waterproof. You are able to shower, bathe, or swim with the monitor on. They just ask you do not submerge deeper than  3 feet underwater. We recommend removing the monitor if you are swimming in a lake, river, or ocean.  Your monitor battery will need to be switched to a fully charged monitor battery approximately once a week. The cell phone will alert you of an action which needs to be made.  On the cell phone, tap for details to reveal connection status, monitor battery status, and cell phone battery status. The green dots indicates your monitor is in good status. A red dot indicates there is something that needs your attention.  To record a symptom, click the circle on the monitor battery. In 30-60 seconds a list of symptoms will appear on the cell phone. Select your symptom and tap save. Your monitor will record a sustained or significant arrhythmia regardless of you clicking the button. Some patients do not feel the heart rhythm irregularities. [employer] will notify us of any serious or critical events.  Refer to instruction booklet for instructions on switching batteries, changing strips, the Do not disturb or Pause features, or any additional questions.  Call [employer] at [phone], to confirm your monitor is transmitting and record your baseline. They will answer any questions you may have regarding the monitor instructions at that time.  Returning the monitor to [facility] all equipment back into blue box. Peel off strip of paper to expose adhesive and close box securely. There is a prepaid UPS shipping label on this box. Drop in a UPS drop box, or at a UPS facility like [employer]. You may also contact [employer] to arrange UPS to pick up monitor package at your home.

## 2021-01-08 ENCOUNTER — Ambulatory Visit (INDEPENDENT_AMBULATORY_CARE_PROVIDER_SITE_OTHER): Payer: Medicaid Other

## 2021-01-08 DIAGNOSIS — R072 Precordial pain: Secondary | ICD-10-CM | POA: Diagnosis not present

## 2021-01-08 DIAGNOSIS — R002 Palpitations: Secondary | ICD-10-CM | POA: Diagnosis not present

## 2021-01-17 ENCOUNTER — Other Ambulatory Visit: Payer: Self-pay | Admitting: Optometrist

## 2021-01-17 DIAGNOSIS — H02843 Edema of right eye, unspecified eyelid: Secondary | ICD-10-CM

## 2021-01-25 ENCOUNTER — Other Ambulatory Visit: Payer: Self-pay | Admitting: Optometrist

## 2021-01-25 ENCOUNTER — Other Ambulatory Visit: Payer: Self-pay | Admitting: Nurse Practitioner

## 2021-01-25 DIAGNOSIS — H02843 Edema of right eye, unspecified eyelid: Secondary | ICD-10-CM

## 2021-01-30 ENCOUNTER — Other Ambulatory Visit: Payer: Medicaid Other

## 2021-02-02 DIAGNOSIS — R002 Palpitations: Secondary | ICD-10-CM | POA: Diagnosis not present

## 2021-02-02 DIAGNOSIS — R072 Precordial pain: Secondary | ICD-10-CM | POA: Diagnosis not present

## 2021-02-02 LAB — BASIC METABOLIC PANEL
BUN/Creatinine Ratio: 14 (ref 12–28)
BUN: 14 mg/dL (ref 8–27)
CO2: 23 mmol/L (ref 20–29)
Calcium: 10.2 mg/dL (ref 8.7–10.3)
Chloride: 94 mmol/L — ABNORMAL LOW (ref 96–106)
Creatinine, Ser: 1 mg/dL (ref 0.57–1.00)
Glucose: 110 mg/dL — ABNORMAL HIGH (ref 65–99)
Potassium: 5.6 mmol/L — ABNORMAL HIGH (ref 3.5–5.2)
Sodium: 135 mmol/L (ref 134–144)
eGFR: 64 mL/min/{1.73_m2} (ref >59)

## 2021-02-04 ENCOUNTER — Ambulatory Visit (HOSPITAL_COMMUNITY): Payer: Medicaid Other | Attending: Cardiology

## 2021-02-04 ENCOUNTER — Other Ambulatory Visit: Payer: Self-pay

## 2021-02-04 DIAGNOSIS — Z86018 Personal history of other benign neoplasm: Secondary | ICD-10-CM

## 2021-02-04 DIAGNOSIS — R002 Palpitations: Secondary | ICD-10-CM | POA: Diagnosis not present

## 2021-02-04 LAB — ECHOCARDIOGRAM COMPLETE
Area-P 1/2: 3.63 cm2
S' Lateral: 3 cm

## 2021-02-04 MED ORDER — PERFLUTREN LIPID MICROSPHERE
3.0000 mL | INTRAVENOUS | Status: AC | PRN
  Administered 2021-02-04: 3 mL via INTRAVENOUS

## 2021-02-06 ENCOUNTER — Other Ambulatory Visit: Payer: Medicaid Other

## 2021-02-08 ENCOUNTER — Other Ambulatory Visit: Payer: Self-pay

## 2021-02-08 DIAGNOSIS — E875 Hyperkalemia: Secondary | ICD-10-CM

## 2021-02-18 ENCOUNTER — Other Ambulatory Visit: Payer: Self-pay

## 2021-02-18 ENCOUNTER — Ambulatory Visit (INDEPENDENT_AMBULATORY_CARE_PROVIDER_SITE_OTHER): Payer: Medicaid Other | Admitting: Internal Medicine

## 2021-02-18 ENCOUNTER — Encounter: Payer: Self-pay | Admitting: Internal Medicine

## 2021-02-18 VITALS — BP 114/67 | HR 85 | Ht 60.0 in | Wt 202.0 lb

## 2021-02-18 DIAGNOSIS — I493 Ventricular premature depolarization: Secondary | ICD-10-CM | POA: Diagnosis not present

## 2021-02-18 DIAGNOSIS — R072 Precordial pain: Secondary | ICD-10-CM | POA: Diagnosis not present

## 2021-02-18 DIAGNOSIS — R002 Palpitations: Secondary | ICD-10-CM | POA: Diagnosis not present

## 2021-02-18 DIAGNOSIS — I472 Ventricular tachycardia, unspecified: Secondary | ICD-10-CM

## 2021-02-18 DIAGNOSIS — R55 Syncope and collapse: Secondary | ICD-10-CM | POA: Diagnosis not present

## 2021-02-18 DIAGNOSIS — Z86018 Personal history of other benign neoplasm: Secondary | ICD-10-CM | POA: Diagnosis not present

## 2021-02-18 MED ORDER — METOPROLOL TARTRATE 25 MG PO TABS: 25.0000 mg | ORAL_TABLET | Freq: Two times a day (BID) | ORAL | 3 refills | Status: DC

## 2021-02-18 NOTE — Progress Notes (Signed)
Cardiology Office Note:    Date:  02/18/2021   ID:  [name], DOB [birth year], MRN [id number]  PCP:  Vevelyn Francois, NP  Cardiologist:  None  Electrophysiologist:  None    Referring MD: Tresa Garter, MD   Chief Complaint/Reason for Referral: Palpitations, dizziness, chest pain  History of Present Illness:    [name] is a 65 y.o. 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  Recent hospitalization for chest pain radiating to the left arm concerning for cardiac chest pain. She ruled out for MI. Discharged with plan for outpatient follow up. She presents with her son who provides some additional history.   02/18/21: Has had syncope in past. Very frequent NSVT. Continues to have palpitations and chest pain. CCTA is upcoming. Echo showed limited image quality but no definite recurrence of myxoma and grossly normal biventricular size and function. Majority of visit spent discussing management and workup of ectopy and prior syncope.    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[place])    Hemorrhoids    Hyperlipemia    Hypothyroidism    per office note dated 03/25/2014   Migraine    Peptic ulcer associated with Helicobacter pylori infection    Reflux    Stroke ([place]) 1978   "piece tumor broke off & went to left eye; leaving me blind"    Past Surgical History:  Procedure Laterality Date   ADRENAL GLAND SURGERY  1982   on hydrocortisone   [place]  07/1999   ductal tissue excision & bx right; bx only left breast   EYE SURGERY     LEFT HEART CATHETERIZATION WITH CORONARY ANGIOGRAM N/A 10/06/2011   Procedure: LEFT HEART CATHETERIZATION WITH CORONARY ANGIOGRAM;  Surgeon: Clent Demark, MD;  Location: [facility];  Service: Cardiovascular;  Laterality: N/A;   repaired cross eyed     "as a child"   ROTATOR CUFF REPAIR  05/2005   left   VAGINAL HYSTERECTOMY  2000   "partial; w/left ovary"    Current Medications: Current Meds  Medication Sig   aspirin EC 81 MG tablet Take 81 mg by mouth daily. Swallow whole.   Cobalamin Combinations (NEURIVA PLUS PO) Take 1 capsule by mouth daily.   fenofibrate (TRICOR) 48 MG tablet Take 1 tablet (48 mg total) by mouth daily.   Galcanezumab-gnlm (EMGALITY) 120 MG/ML SOAJ Inject 120 mg into the skin every 30 (thirty) days.   hydrocortisone (CORTEF) 20 MG tablet Take 0.5-1 tablets (10-20 mg total) by mouth 2 (two) times daily. Take 1 tablet every morning and 1/2 tablet every evening (Patient taking differently: Take 20 mg by mouth See admin instructions. Take 20 mg by mouth with a meal and 10 mg at bedtime)   levothyroxine (SYNTHROID) 75 MCG tablet Take 1 tablet (75 mcg total) by mouth daily.  Melatonin 10 MG TABS Take 1 tablet by mouth at bedtime.   metoprolol tartrate (LOPRESSOR) 25 MG tablet Take 1 tablet (25 mg total) by mouth daily.   nitroGLYCERIN (NITROSTAT) 0.4 MG SL tablet Place 1 tablet (0.4 mg total) under the tongue every 5 (five) minutes x 3 doses as needed for chest pain.   omega-3 acid ethyl esters (LOVAZA) 1 g capsule Take 1 capsule (1 g total) by mouth 2 (two) times daily.   omeprazole (PRILOSEC) 20 MG capsule Take 20 mg by mouth 2 (two) times daily.   Rimegepant Sulfate (NURTEC) 75 MG TBDP Take 75 mg by mouth daily as needed. For migraines. Take as close to onset of migraine as possible. One daily maximum. (Patient taking differently: Take 75 mg by mouth daily as  needed (migraine). For migraines. Take as close to onset of migraine as possible. One daily maximum.)   rosuvastatin (CRESTOR) 10 MG tablet Take 1 tablet (10 mg total) by mouth daily.   venlafaxine XR (EFFEXOR-XR) 150 MG 24 hr capsule Take 1 capsule (150 mg total) by mouth daily.     Allergies:   Aspirin, Ibuprofen [ibuprofen], and Penicillins   Social History   Tobacco Use   Smoking status: Never   Smokeless tobacco: Never  Vaping Use   Vaping Use: Never used  Substance Use Topics   Alcohol use: No   Drug use: No     Family History: The patient's family history includes Acromegaly in her father; Colon cancer (age of onset: 75) in her sister; Diabetes in her sister; Heart disease in her daughter, father, and sister; Kidney disease in her sister; Stomach cancer in her father. There is no history of Colon polyps, Esophageal cancer, Gallbladder disease, or Migraines.  ROS:   Please see the history of present illness.    All other systems reviewed and are negative.  EKGs/Labs/Other Studies Reviewed:    The following studies were reviewed today:  EKG:  NSR, nonspecific T wave abnormality.   Recent Labs: 09/07/2020: ALT 21 12/04/2020: Magnesium 1.9; TSH 0.186 12/05/2020: Hemoglobin 12.8; Platelets 336 02/02/2021: BUN 14; Creatinine, Ser 1.00; Potassium 5.6; Sodium 135  Recent Lipid Panel    Component Value Date/Time   CHOL 99 12/05/2020 0251   CHOL 164 09/27/2018 1048   TRIG 111 12/05/2020 0251   HDL 37 (L) 12/05/2020 0251   HDL 50 09/27/2018 1048   CHOLHDL 2.7 12/05/2020 0251   VLDL 22 12/05/2020 0251   LDLCALC 40 12/05/2020 0251   LDLCALC 73 09/27/2018 1048    Physical Exam:    VS:  BP 114/67   Pulse 85   Ht 5' (1.524 m)   Wt 202 lb (91.6 kg)   SpO2 99%   BMI 39.45 kg/m     Wt Readings from Last 5 Encounters:  02/18/21 202 lb (91.6 kg)  01/04/21 210 lb (95.3 kg)  12/05/20 199 lb 14.4 oz (90.7 kg)  11/25/20 202 lb 3.2 oz (91.7 kg)  05/25/20 202 lb (91.6 kg)     Constitutional: No acute distress Eyes: sclera non-icteric, normal conjunctiva and lids ENMT: normal dentition, moist mucous membranes Cardiovascular: irregular rhythm, normal rate, no murmurs. S1 and S2 normal. Radial pulses normal bilaterally. No jugular venous distention.  Respiratory: clear to auscultation bilaterally GI : normal bowel sounds, soft and nontender. No distention.   MSK: extremities warm, well perfused. No edema.  NEURO: grossly nonfocal exam, moves all extremities. PSYCH: alert and oriented x 3, normal mood and affect.  ASSESSMENT:    1. Ventricular tachycardia ([place])   2. Syncope and collapse   3. Frequent PVCs   4. Precordial pain   5. Palpitations   6. History of atrial myxoma     PLAN:    Ventricular tachycardia ([place]) - Plan: metoprolol tartrate (LOPRESSOR) 25 MG tablet, Ambulatory referral to Cardiac Electrophysiology Syncope and collapse - Plan: metoprolol tartrate (LOPRESSOR) 25 MG tablet, Ambulatory referral to Cardiac Electrophysiology Frequent PVCs Palpitations - I'm quite concerned about the degree of ectopy she has and etiology as well as history of multiple syncopal episodes. Need to uptitrate beta blocker. She is only taking metoprolol 1 x a day, will increase to correct dosage of 2 x daily and will refer to EP. Needs ischemic workup, will perform CCTA which is upcoming. We discussed possible need for cardiac MRI as well for cardiomyopathies as etiology for PVCs. Fortunately PVC burden is <20% and EF is grossly normal. I am more concerned about the frequency of NSVT. Discussed referral to EP for possible medication vs ablative therapy, at their discretion.   Precordial pain - CCTA upcoming.   History of atrial myxoma - no recurrence by echo but this will be better seen on CCTA. No definite features of Carney complex in patient, but has a strong family history of atrial myxomas and likely is genetic.   RTC after CCTA and after EP referral.    Total time of encounter: 30 minutes total time of encounter, including 25 minutes spent in face-to-face patient care on the date of this encounter. This time includes coordination of care and counseling regarding above mentioned problem list. Remainder of non-face-to-face time involved reviewing chart documents/testing relevant to the patient encounter and documentation in the medical record. I have independently reviewed documentation from referring provider.   Cherlynn Kaiser, MD, [facility]   Medication Adjustments/Labs and Tests Ordered: Current medicines are reviewed at length with the patient today.  Concerns regarding medicines are outlined above.   Orders Placed This Encounter  Procedures   Ambulatory referral to Cardiac Electrophysiology     Meds ordered this encounter  Medications   metoprolol tartrate (LOPRESSOR) 25 MG tablet    Sig: Take 1 tablet (25 mg total) by mouth 2 (two) times daily.    Dispense:  180 tablet    Refill:  3     Patient Instructions  Medication Instructions:   Increase Metoprolol to 25 mg twice a day  On the morning of your Heart Test 6/23 Take Metoprolol 4 tablets ( 100 mg ) 2 hours before test  *If you need a refill on your cardiac medications before your next appointment, please call your pharmacy*   Lab Work: None ordered   Testing/Procedures: None ordered   Follow-Up: At [facility], you and your health needs are our priority.  As part of our continuing mission to provide you with exceptional heart care, we have created designated Provider Care Teams.  These Care Teams include your primary Cardiologist (physician) and Advanced Practice Providers (APPs -  Physician Assistants and Nurse Practitioners) who all work together to provide you with the care you need, when you need it.  We recommend signing up for the patient portal called "MyChart".  Sign up information is provided on this After Visit Summary.   MyChart is used to connect with patients for Virtual Visits (Telemedicine).  Patients are able to view lab/test results, encounter notes, upcoming appointments, etc.  Non-urgent messages can be sent to your  provider as well.   To learn more about what you can do with MyChart, go to [email or web address].    Your next appointment:  Wednesday 03/09/21 at 9:00 am   The format for your next appointment: Office   Provider: Dr.Saide Lanuza    Dr.Taylor    [place]    Tuesday 6/28 at 2:30 pm

## 2021-02-18 NOTE — Patient Instructions (Addendum)
Medication Instructions:   Increase Metoprolol to 25 mg twice a day  On the morning of your Heart Test 6/23 Take Metoprolol 4 tablets ( 100 mg ) 2 hours before test  *If you need a refill on your cardiac medications before your next appointment, please call your pharmacy*   Lab Work: None ordered   Testing/Procedures: None ordered   Follow-Up: At [facility], you and your health needs are our priority.  As part of our continuing mission to provide you with exceptional heart care, we have created designated Provider Care Teams.  These Care Teams include your primary Cardiologist (physician) and Advanced Practice Providers (APPs -  Physician Assistants and Nurse Practitioners) who all work together to provide you with the care you need, when you need it.  We recommend signing up for the patient portal called "MyChart".  Sign up information is provided on this After Visit Summary.  MyChart is used to connect with patients for Virtual Visits (Telemedicine).  Patients are able to view lab/test results, encounter notes, upcoming appointments, etc.  Non-urgent messages can be sent to your provider as well.   To learn more about what you can do with MyChart, go to [email or web address].    Your next appointment:  Wednesday 03/09/21 at 9:00 am   The format for your next appointment: Office   Provider: Dr.Acharya    Dr.Taylor    [place]    Tuesday 6/28 at 2:30 pm

## 2021-02-19 ENCOUNTER — Encounter (HOSPITAL_COMMUNITY): Payer: Self-pay | Admitting: Emergency Medicine

## 2021-02-19 ENCOUNTER — Emergency Department (HOSPITAL_COMMUNITY)
Admission: EM | Admit: 2021-02-19 | Discharge: 2021-02-20 | Disposition: A | Discharge status: Home / Self Care | Payer: Medicaid Other | Attending: Emergency Medicine | Admitting: Emergency Medicine

## 2021-02-19 ENCOUNTER — Emergency Department (HOSPITAL_COMMUNITY): Payer: Medicaid Other

## 2021-02-19 DIAGNOSIS — Z955 Presence of coronary angioplasty implant and graft: Secondary | ICD-10-CM | POA: Insufficient documentation

## 2021-02-19 DIAGNOSIS — Z7982 Long term (current) use of aspirin: Secondary | ICD-10-CM | POA: Diagnosis not present

## 2021-02-19 DIAGNOSIS — I959 Hypotension, unspecified: Secondary | ICD-10-CM | POA: Diagnosis not present

## 2021-02-19 DIAGNOSIS — R0602 Shortness of breath: Secondary | ICD-10-CM | POA: Diagnosis not present

## 2021-02-19 DIAGNOSIS — R0789 Other chest pain: Secondary | ICD-10-CM | POA: Diagnosis not present

## 2021-02-19 DIAGNOSIS — I10 Essential (primary) hypertension: Secondary | ICD-10-CM | POA: Insufficient documentation

## 2021-02-19 DIAGNOSIS — E039 Hypothyroidism, unspecified: Secondary | ICD-10-CM | POA: Diagnosis not present

## 2021-02-19 DIAGNOSIS — R072 Precordial pain: Secondary | ICD-10-CM | POA: Diagnosis not present

## 2021-02-19 DIAGNOSIS — R079 Chest pain, unspecified: Secondary | ICD-10-CM | POA: Diagnosis not present

## 2021-02-19 DIAGNOSIS — Z79899 Other long term (current) drug therapy: Secondary | ICD-10-CM | POA: Diagnosis not present

## 2021-02-19 DIAGNOSIS — Z8616 Personal history of COVID-19: Secondary | ICD-10-CM | POA: Insufficient documentation

## 2021-02-19 DIAGNOSIS — R0902 Hypoxemia: Secondary | ICD-10-CM | POA: Diagnosis not present

## 2021-02-19 DIAGNOSIS — J45909 Unspecified asthma, uncomplicated: Secondary | ICD-10-CM | POA: Insufficient documentation

## 2021-02-19 LAB — CBC WITH DIFFERENTIAL/PLATELET
Abs Immature Granulocytes: 0.01 10*3/uL (ref 0.00–0.07)
Basophils Absolute: 0 10*3/uL (ref 0.0–0.1)
Basophils Relative: 0 %
Eosinophils Absolute: 0.2 10*3/uL (ref 0.0–0.5)
Eosinophils Relative: 3 %
HCT: 41.6 % (ref 36.0–46.0)
Hemoglobin: 13.4 g/dL (ref 12.0–15.0)
Immature Granulocytes: 0 %
Lymphocytes Relative: 29 %
Lymphs Abs: 1.8 10*3/uL (ref 0.7–4.0)
MCH: 29.6 pg (ref 26.0–34.0)
MCHC: 32.2 g/dL (ref 30.0–36.0)
MCV: 92 fL (ref 80.0–100.0)
Monocytes Absolute: 0.4 10*3/uL (ref 0.1–1.0)
Monocytes Relative: 7 %
Neutro Abs: 3.8 10*3/uL (ref 1.7–7.7)
Neutrophils Relative %: 61 %
Platelets: 375 10*3/uL (ref 150–400)
RBC: 4.52 MIL/uL (ref 3.87–5.11)
RDW: 13.5 % (ref 11.5–15.5)
WBC: 6.3 10*3/uL (ref 4.0–10.5)
nRBC: 0 % (ref 0.0–0.2)

## 2021-02-19 LAB — TROPONIN I (HIGH SENSITIVITY)
Troponin I (High Sensitivity): 3 ng/L (ref <18)
Troponin I (High Sensitivity): 2 ng/L (ref <18)

## 2021-02-19 LAB — BASIC METABOLIC PANEL
Anion gap: 8 (ref 5–15)
BUN: 17 mg/dL (ref 8–23)
CO2: 23 mmol/L (ref 22–32)
Calcium: 10 mg/dL (ref 8.9–10.3)
Chloride: 103 mmol/L (ref 98–111)
Creatinine, Ser: 0.97 mg/dL (ref 0.44–1.00)
GFR, Estimated: >60 mL/min (ref >60)
Glucose, Bld: 111 mg/dL — ABNORMAL HIGH (ref 70–99)
Potassium: 5 mmol/L (ref 3.5–5.1)
Sodium: 134 mmol/L — ABNORMAL LOW (ref 135–145)

## 2021-02-19 MED ORDER — KETOROLAC TROMETHAMINE 30 MG/ML IJ SOLN
30.0000 mg | Freq: Once | INTRAMUSCULAR | Status: AC
  Administered 2021-02-20: 30 mg via INTRAVENOUS
  Filled 2021-02-19: qty 1

## 2021-02-19 NOTE — ED Provider Notes (Signed)
Emergency Medicine Provider Triage Evaluation Note  [name] , a 65 y.o. female  was evaluated in triage.  Pt complains of chest pain woke her up from sleep center radiates to right arm CP now 9/10. Worse with breathing and palpation. Took nitro/ASA with some relief. Cardiac history..  Review of Systems  Positive: CP  Negative: Nausea vomiting abdominal pain  Physical Exam  BP 115/71   Pulse 77   Temp 98.7 F (37.1 C) (Oral)   Resp 18   Ht 5' (1.524 m)   Wt 91.6 kg   SpO2 99%   BMI 39.45 kg/m  Gen:   Awake, no distress   Resp:  Normal effort  MSK:   Moves extremities without difficulty  Cardiac: TTP chest wall    Medical Decision Making  Medically screening exam initiated at 2:20 PM.  Appropriate orders placed.   Patient made aware this encounter is a triage and screening encounter and no beds are immediately available at this time in the ER.  Patient was informed that the remainder of the evaluation will be completed by another provider.  Patient made aware triage orders have been placed and patient will be placed in the waiting room while work up is initiated and until a room becomes available. Patient encouraged to await a formal ER encounter with a clinician.  Patient made aware that exiting the department prior to formal encounter with an ER clinician and completion of the work-up is considered leaving against medical advice.  At that time there is no guarantee that there are no emergency medical conditions present and patient assumes risks of leaving including worsening condition, permanent disability and death. Patient verbalizes understanding.     Mikayla Feil, PA-C 02/19/21 1421    Mikayla Belling, MD 02/19/21 1939

## 2021-02-19 NOTE — ED Provider Notes (Signed)
[facility] EMERGENCY DEPARTMENT Provider Note   CSN: [id number] Arrival date & time: 02/19/21  1410     History Chief Complaint  Patient presents with   Chest Pain    [name] is a 65 y.o. female.  Patient is a 65 year old female with past medical history of atrial myxoma excision at the age of 38, hyperlipidemia, asthma.  Patient presenting today for evaluation of chest pain.  She describes a 2-day history of sharp pain to the center of her chest that radiates down her right arm.  This pain began in the absence of any injury or trauma.  The pain is sharp in nature with no shortness of breath, nausea, or diaphoresis.  Pain is worse with inspiration and palpation, and is alleviated with rest.  Patient denies other cardiac history, but appears to have had a heart cath in 2013 that was unremarkable.  The history is provided by the patient.  Chest Pain Pain location:  Substernal area Pain quality: sharp   Pain radiates to:  R arm Pain severity:  Moderate Onset quality:  Sudden Duration:  2 days Timing:  Constant Progression:  Unchanged Chronicity:  New Worsened by:  Certain positions, deep breathing and movement (Palpation)     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HCC)    Hemorrhoids    Hyperlipemia    Hypothyroidism    per office note dated 03/25/2014   Migraine    Peptic ulcer associated with Helicobacter pylori infection    Reflux    Stroke ([place]) 1978   "piece tumor broke off & went to left eye; leaving me blind"    Patient Active Problem List   Diagnosis Date Noted   New-onset angina ([place]) 12/04/2020   Chronic migraine  without aura, with intractable migraine, so stated, with status migrainosus 05/25/2020   Frequent PVCs    COVID-19 10/10/2019   Hypoxia    Migraine with aura and without status migrainosus, not intractable    Tachycardia    ARF (acute renal failure) ([place]) 12/06/2018   Fever 12/06/2018   Acute hypoxemic respiratory failure ([place]) 12/06/2018   Headache 11/02/2013   Dizziness 11/02/2013   Anemia 01/10/2013   Chest pain, unspecified 01/10/2013   Adjustment disorder with mixed anxiety and depressed mood 01/10/2013   Adrenal crisis ([place]) 01/07/2013   Nausea & vomiting 08/03/2011   Hypotension 08/03/2011   Adrenal insufficiency ([place]) 08/03/2011   Hyponatremia 08/03/2011   Hypothyroidism 08/03/2011   Depression 08/03/2011    Past Surgical History:  Procedure Laterality Date   ADRENAL GLAND SURGERY  1982   on hydrocortisone   ATRIAL MYXOMA EXCISION  1978   BREAST BIOPSY  07/1999   ductal tissue excision & bx right; bx only left breast   EYE SURGERY     LEFT HEART CATHETERIZATION WITH CORONARY ANGIOGRAM N/A 10/06/2011   Procedure: LEFT HEART CATHETERIZATION WITH CORONARY ANGIOGRAM;  Surgeon: Clent Demark, MD;  Location: [facility];  Service: Cardiovascular;  Laterality: N/A;   repaired cross eyed     "  as a child"   ROTATOR CUFF REPAIR  05/2005   left   VAGINAL HYSTERECTOMY  2000   "partial; w/left ovary"     OB History   No obstetric history on file.     Family History  Problem Relation Age of Onset   Colon cancer Sister 35   Acromegaly Father    Heart disease Father        Tumor in the heart   Stomach cancer Father    Diabetes Sister    Kidney disease Sister        x2   Heart disease Sister        Tumor in the heart   Heart disease Daughter        Tumor in the heart   Colon polyps Neg Hx    Esophageal cancer Neg Hx    Gallbladder disease Neg Hx    Migraines Neg Hx     Social History   Tobacco Use   Smoking status: Never   Smokeless tobacco: Never  Vaping  Use   Vaping Use: Never used  Substance Use Topics   Alcohol use: No   Drug use: No    Home Medications Prior to Admission medications   Medication Sig Start Date End Date Taking? Authorizing Provider  aspirin EC 81 MG tablet Take 81 mg by mouth daily. Swallow whole.    [provider]  Cobalamin Combinations (NEURIVA PLUS PO) Take 1 capsule by mouth daily.    [provider]  fenofibrate (TRICOR) 48 MG tablet Take 1 tablet (48 mg total) by mouth daily. 11/25/20   Vevelyn Francois, NP  Galcanezumab-gnlm (EMGALITY) 120 MG/ML SOAJ Inject 120 mg into the skin every 30 (thirty) days. 05/25/20   Melvenia Beam, MD  hydrocortisone (CORTEF) 20 MG tablet Take 0.5-1 tablets (10-20 mg total) by mouth 2 (two) times daily. Take 1 tablet every morning and 1/2 tablet every evening Patient taking differently: Take 20 mg by mouth See admin instructions. Take 20 mg by mouth with a meal and 10 mg at bedtime 12/28/60   Delora Fuel, MD  levothyroxine (SYNTHROID) 75 MCG tablet Take 1 tablet (75 mcg total) by mouth daily. 01/25/21 01/25/22  Vevelyn Francois, NP  Melatonin 10 MG TABS Take 1 tablet by mouth at bedtime. 11/08/20 11/08/21  Vevelyn Francois, NP  metoprolol tartrate (LOPRESSOR) 25 MG tablet Take 1 tablet (25 mg total) by mouth 2 (two) times daily. 02/18/21 05/19/21  Elouise Munroe, MD  nitroGLYCERIN (NITROSTAT) 0.4 MG SL tablet Place 1 tablet (0.4 mg total) under the tongue every 5 (five) minutes x 3 doses as needed for chest pain. 12/05/20   Charolette Forward, MD  omega-3 acid ethyl esters (LOVAZA) 1 g capsule Take 1 capsule (1 g total) by mouth 2 (two) times daily. 11/25/20 11/25/21  Vevelyn Francois, NP  omeprazole (PRILOSEC) 20 MG capsule Take 20 mg by mouth 2 (two) times daily. 11/15/20   [provider]  Rimegepant Sulfate (NURTEC) 75 MG TBDP Take 75 mg by mouth daily as needed. For migraines. Take as close to onset of migraine as possible. One daily maximum. Patient taking differently:  Take 75 mg by mouth daily as needed (migraine). For migraines. Take as close to onset of migraine as possible. One daily maximum. 05/25/20   Melvenia Beam, MD  rosuvastatin (CRESTOR) 10 MG tablet Take 1 tablet (10 mg total) by mouth daily. 11/25/20 11/25/21  Vevelyn Francois, NP  venlafaxine  XR (EFFEXOR-XR) 150 MG 24 hr capsule Take 1 capsule (150 mg total) by mouth daily. 11/25/20 11/25/21  Vevelyn Francois, NP    Allergies    Aspirin, Ibuprofen [ibuprofen], and Penicillins  Review of Systems   Review of Systems  Cardiovascular:  Positive for chest pain.  All other systems reviewed and are negative.  Physical Exam Updated Vital Signs BP 121/81   Pulse 88   Temp 97.7 F (36.5 C) (Oral)   Resp 17   Ht 5' (1.524 m)   Wt 91.6 kg   SpO2 97%   BMI 39.45 kg/m   Physical Exam Vitals and nursing note reviewed.  Constitutional:      General: She is not in acute distress.    Appearance: She is well-developed. She is not diaphoretic.  HENT:     Head: Normocephalic and atraumatic.  Cardiovascular:     Rate and Rhythm: Normal rate and regular rhythm.     Heart sounds: No murmur heard.   No friction rub. No gallop.  Pulmonary:     Effort: Pulmonary effort is normal. No respiratory distress.     Breath sounds: Normal breath sounds. No wheezing.  Abdominal:     General: Bowel sounds are normal. There is no distension.     Palpations: Abdomen is soft.     Tenderness: There is no abdominal tenderness.  Musculoskeletal:        General: Normal range of motion.     Cervical back: Normal range of motion and neck supple.     Right lower leg: No tenderness. No edema.     Left lower leg: No tenderness. No edema.     Comments: There is no leg swelling or calf tenderness.  Bevelyn Buckles' sign is absent bilaterally.  Skin:    General: Skin is warm and dry.  Neurological:     General: No focal deficit present.     Mental Status: She is alert and oriented to person, place, and time.    ED Results /  Procedures / Treatments   Labs (all labs ordered are listed, but only abnormal results are displayed) Labs Reviewed  BASIC METABOLIC PANEL - Abnormal; Notable for the following components:      Result Value   Sodium 134 (*)    Glucose, Bld 111 (*)    All other components within normal limits  CBC WITH DIFFERENTIAL/PLATELET  TROPONIN I (HIGH SENSITIVITY)  TROPONIN I (HIGH SENSITIVITY)    EKG EKG Interpretation  Date/Time:  Saturday February 19 2021 14:21:23 EDT Ventricular Rate:  77 PR Interval:  160 QRS Duration: 84 QT Interval:  382 QTC Calculation: 432 R Axis:   41 Text Interpretation: Normal sinus rhythm Normal ECG Confirmed by Veryl Speak [phone]) on 02/19/2021 11:06:01 PM  Radiology DG Chest Port 1 View  Result Date: 02/19/2021 CLINICAL DATA:  Chest pain and shortness of breath. EXAM: PORTABLE CHEST 1 VIEW COMPARISON:  12/04/2020 FINDINGS: The cardiac silhouette, mediastinal and hilar contours are within normal limits and stable. Stable surgical changes from cardiac surgery. Stable remote epicardial pacer wires. No acute pulmonary findings. No pleural effusions. No pulmonary lesions. IMPRESSION: No acute cardiopulmonary findings. Electronically Signed   By: Marijo Sanes M.D.   On: 02/19/2021 14:46    Procedures Procedures   Medications Ordered in ED Medications  ketorolac (TORADOL) 30 MG/ML injection 30 mg (has no administration in time range)    ED Course  I have reviewed the triage vital signs and the nursing notes.  Pertinent labs & imaging results that were available during my care of the patient were reviewed by me and considered in my medical decision making (see chart for details).    MDM Rules/Calculators/A&P  Patient is a 65 year old female presenting here with complaints of chest discomfort.  This started yesterday morning.  Her pain is intermittent and worse when she moves, breathes, or presses on her chest.  Symptoms are atypical for cardiac pain.  Patient  has an unchanged EKG and troponin is negative x2 greater than 24 hours after the onset of symptoms.  Her pain is easily reproducible with palpation of the anterior chest wall and I suspect a musculoskeletal etiology.  Patient has been given Toradol here in the ER and I believe is appropriate for discharge with rest, Tylenol, and follow-up with her cardiologist if symptoms persist.  Final Clinical Impression(s) / ED Diagnoses Final diagnoses:  None    Rx / DC Orders ED Discharge Orders     None        Veryl Speak, MD 02/20/21 [phone]

## 2021-02-19 NOTE — ED Triage Notes (Signed)
Pt arrives via EMS with generalized sharp CP since midnight. Pt took 1 nitro and 324 ASA Worse with deep breath.

## 2021-02-20 NOTE — Discharge Instructions (Addendum)
Rest.  Take Tylenol 1000 mg every 6 hours as needed for pain.  Follow-up with primary doctor if symptoms are not improving in the next few days, and return to the ER if symptoms significantly worsen or change.

## 2021-02-21 ENCOUNTER — Telehealth: Payer: Self-pay | Admitting: *Deleted

## 2021-02-21 NOTE — Telephone Encounter (Signed)
Transition Care Management Unsuccessful Follow-up Telephone Call  Date of discharge and from where:  02/20/2021 [name]  Attempts:  1st Attempt  Reason for unsuccessful TCM follow-up call:  No answer/busy

## 2021-02-22 NOTE — Telephone Encounter (Signed)
Transition Care Management Unsuccessful Follow-up Telephone Call  Date of discharge and from where:  [facility]/20/[name]  Attempts:  2nd Attempt  Reason for unsuccessful TCM follow-up call:  No answer/busy

## 2021-02-23 NOTE — Telephone Encounter (Signed)
Transition Care Management Unsuccessful Follow-up Telephone Call  Date of discharge and from where:  [facility]/20/[name]  Attempts:  3rd Attempt  Reason for unsuccessful TCM follow-up call:  No answer/busy

## 2021-03-02 ENCOUNTER — Telehealth (HOSPITAL_COMMUNITY): Payer: Self-pay | Admitting: *Deleted

## 2021-03-02 NOTE — Telephone Encounter (Signed)
Reaching out to patient to offer assistance regarding upcoming cardiac imaging study; pt verbalizes understanding of appt date/time, parking situation and where to check in, pre-test NPO status and medications ordered, and verified current allergies; name and call back number provided for further questions should they arise  Gordy Clement RN Navigator Cardiac Imaging [facility] [phone] office [phone] cell  Pt to take 100mg  metoprolol tartrate 2 hours prior to cardiac CT.

## 2021-03-02 NOTE — Telephone Encounter (Signed)
Attempted to call patient regarding upcoming cardiac CT appointment. °Left message on voicemail with name and callback number ° °Chandra Feger RN Navigator Cardiac Imaging °[facility] °[phone] Office °[phone] Cell ° °

## 2021-03-03 ENCOUNTER — Ambulatory Visit (HOSPITAL_COMMUNITY)
Admission: RE | Admit: 2021-03-03 | Discharge: 2021-03-03 | Disposition: A | Discharge status: Home / Self Care | Payer: Medicaid Other | Source: Ambulatory Visit | Attending: Internal Medicine | Admitting: Internal Medicine

## 2021-03-03 ENCOUNTER — Other Ambulatory Visit: Payer: Self-pay

## 2021-03-03 DIAGNOSIS — R002 Palpitations: Secondary | ICD-10-CM | POA: Diagnosis present

## 2021-03-03 DIAGNOSIS — R072 Precordial pain: Secondary | ICD-10-CM | POA: Diagnosis not present

## 2021-03-03 MED ORDER — NITROGLYCERIN 0.4 MG SL SUBL
SUBLINGUAL_TABLET | SUBLINGUAL | Status: AC
  Administered 2021-03-03: 0.8 mg via SUBLINGUAL
  Filled 2021-03-03: qty 2

## 2021-03-03 MED ORDER — NITROGLYCERIN 0.4 MG SL SUBL: 0.8000 mg | SUBLINGUAL_TABLET | Freq: Once | SUBLINGUAL | Status: AC

## 2021-03-03 MED ORDER — IOHEXOL 350 MG/ML SOLN
95.0000 mL | Freq: Once | INTRAVENOUS | Status: AC | PRN
  Administered 2021-03-03: 95 mL via INTRAVENOUS

## 2021-03-03 NOTE — Progress Notes (Signed)
   03/03/21 1326  Vital Signs  ECG Heart Rate 66  BP 101/61  MAP (mmHg) 74  BP Location Right Arm  BP Method Automatic  Patient Position (if appropriate) Sitting  MEWS Score  MEWS Temp 0  MEWS Systolic 0  MEWS Pulse 0  MEWS RR 0  MEWS LOC 0  MEWS Score 0  MEWS Score Color Green    Patient cardiac CT imaging completed. Patient with nausea post scan, provided light snack and ginger ale post procedure. Vitals signs stable. Able to discharge after snack with son, discharged to private vehicle in stable condition.

## 2021-03-08 ENCOUNTER — Ambulatory Visit (INDEPENDENT_AMBULATORY_CARE_PROVIDER_SITE_OTHER): Payer: Medicaid Other | Admitting: Internal Medicine

## 2021-03-08 ENCOUNTER — Encounter: Payer: Self-pay | Admitting: Internal Medicine

## 2021-03-08 ENCOUNTER — Other Ambulatory Visit: Payer: Self-pay

## 2021-03-08 ENCOUNTER — Telehealth: Payer: Self-pay | Admitting: Internal Medicine

## 2021-03-08 VITALS — BP 96/52 | HR 83 | Ht 62.0 in | Wt 204.4 lb

## 2021-03-08 DIAGNOSIS — I472 Ventricular tachycardia: Secondary | ICD-10-CM | POA: Diagnosis not present

## 2021-03-08 DIAGNOSIS — I493 Ventricular premature depolarization: Secondary | ICD-10-CM | POA: Diagnosis not present

## 2021-03-08 DIAGNOSIS — I4729 Other ventricular tachycardia: Secondary | ICD-10-CM | POA: Insufficient documentation

## 2021-03-08 MED ORDER — FLECAINIDE ACETATE 50 MG PO TABS: 50.0000 mg | ORAL_TABLET | Freq: Two times a day (BID) | ORAL | 11 refills | Status: DC

## 2021-03-08 NOTE — Patient Instructions (Addendum)
Medication Instructions:  Your physician has recommended you make the following change in your medication:    START taking flecainide 50 mg-  Take one tablet by mouth twice a day   Labwork: None ordered.  Testing/Procedures: None ordered.  Follow-Up:  You will come to the [facility] office on March 21, 2021 at 11:00 am for an EKG  Your physician wants you to follow-up in: 6 months with Cristopher Peru, MD or one of the following Advanced Practice Providers on your designated Care Team:   Tommye Standard, [place] Legrand Como "Jonni Sanger" Chalmers Cater, [place]   Any Other Special Instructions Will Be Listed Below (If Applicable).  If you need a refill on your cardiac medications before your next appointment, please call your pharmacy.   Flecainide Tablets What is this medication? FLECAINIDE (FLEK a nide) prevents and treats a fast or irregular heartbeat (arrhythmia). It is often used to treat a type of arrhythmia known as AFib (atrial fibrillation). It works by slowing down overactive electric signals in the heart, which stabilizes your heart rhythm. It belongs to a group ofmedications called antiarrhythmics. This medicine may be used for other purposes; ask your health care provider orpharmacist if you have questions. COMMON BRAND NAME(S): Tambocor What should I tell my care team before I take this medication? They need to know if you have any of these conditions: Abnormal levels of potassium in the blood Heart disease including heart rhythm and heart rate problems Kidney or liver disease Recent heart attack An unusual or allergic reaction to flecainide, local anesthetics, other medications, foods, dyes, or preservatives Pregnant or trying to get pregnant Breast-feeding How should I use this medication? Take this medication by mouth with a glass of water. Follow the directions on the prescription label. You can take this medication with or without food. Take your doses at regular intervals. Do not take your  medication more often than directed. Do not stop taking this medication suddenly. This may cause serious, heart-related side effects. If your care team wants you to stop the medication,the dose may be slowly lowered over time to avoid any side effects. Talk to your care team regarding the use of this medication in children. While this medication may be prescribed for children as young as 1 year of age forselected conditions, precautions do apply. Overdosage: If you think you have taken too much of this medicine contact apoison control center or emergency room at once. NOTE: This medicine is only for you. Do not share this medicine with others. What if I miss a dose? If you miss a dose, take it as soon as you can. If it is almost time for yournext dose, take only that dose. Do not take double or extra doses. What may interact with this medication? Do not take this medication with any of the following: Amoxapine Arsenic trioxide Certain antibiotics like clarithromycin, erythromycin, gatifloxacin, gemifloxacin, levofloxacin, moxifloxacin, sparfloxacin, or troleandomycin Certain antidepressants called tricyclic antidepressants like amitriptyline, imipramine, or nortriptyline Certain medications to control heart rhythm like disopyramide, encainide, moricizine, procainamide, propafenone, and quinidine Cisapride Delavirdine Droperidol Haloperidol [place]orn Imatinib Levomethadyl Maprotiline Medications for malaria like chloroquine and halofantrine Pentamidine Phenothiazines like chlorpromazine, mesoridazine, prochlorperazine, thioridazine Pimozide Quinine Ranolazine Ritonavir Sertindole This medication may also interact with the following: Cimetidine Dofetilide Medications for angina or high blood pressure Medications to control heart rhythm like amiodarone and digoxin Ziprasidone This list may not describe all possible interactions. Give your health care provider a list of all the  medicines, herbs, non-prescription drugs, or  dietary supplements you use. Also tell them if you smoke, drink alcohol, or use illegaldrugs. Some items may interact with your medicine. What should I watch for while using this medication? Visit your care team for regular checks on your progress. Because your condition and the use of this medication carries some risk, it is a good idea to carry an identification card, necklace or bracelet with details of yourcondition, medications, and care team. Check your blood pressure and pulse rate regularly. Ask your care team what your blood pressure and pulse rate should be, and when you should contact them. Your care team also may schedule regular blood tests and electrocardiograms tocheck your progress. You may get drowsy or dizzy. Do not drive, use machinery, or do anything that needs mental alertness until you know how this medication affects you. Do not stand or sit up quickly, especially if you are an older patient. This reduces the risk of dizzy or fainting spells. Alcohol can make you more dizzy, increaseflushing and rapid heartbeats. Avoid alcoholic drinks. What side effects may I notice from receiving this medication? Side effects that you should report to your care team as soon as possible: Allergic reactions-skin rash, itching, hives, swelling of the face, lips, tongue, or throat Heart failure-shortness of breath, swelling of the ankles, feet, or hands, sudden weight gain, unusual weakness or fatigue Heart rhythm changes-fast or irregular heartbeat, dizziness, feeling faint or lightheaded, chest pain, trouble breathing Liver injury-right upper belly pain, loss of appetite, nausea, light-colored stool, dark yellow or brown urine, yellowing skin or eyes, unusual weakness or fatigue Side effects that usually do not require medical attention (report to your careteam if they continue or are bothersome): Blurry  vision Constipation Dizziness Fatigue Headache Nausea Tremors or shaking This list may not describe all possible side effects. Call your doctor for medical advice about side effects. You may report side effects to FDA at1-800-FDA-1088. Where should I keep my medication? Keep out of the reach of children and pets. Store at room temperature between 15 and 30 degrees C (59 and 86 degrees F). Protect from light. Keep container tightly closed. Throw away any unusedmedication after the expiration date. NOTE: This sheet is a summary. It may not cover all possible information. If you have questions about this medicine, talk to your doctor, pharmacist, orhealth care provider.  2022 Elsevier/Gold Standard (2020-09-30 12:17:39)

## 2021-03-08 NOTE — Progress Notes (Signed)
[name] is referred by Dr. Santa Lighter for evaluation of PVC"s and NSVT. She is a pleasant 65 yo woman with a h/o HTN, dyslipidemia and remote atrial myxoma s/p resection. She note palpitations. She has not angina. She does have a h/o atypical chest pain. As mentioned, her PVC burden is about 15%. She tells me she gets dizzy when her heart races but has not passed out with this.  Allergies  Allergen Reactions   Aspirin Other (See Comments)    Can take baby aspirin (heart flutters with high doses)   Ibuprofen [Ibuprofen] Nausea And Vomiting   Penicillins Nausea And Vomiting    Has patient had a PCN reaction causing immediate rash, facial/tongue/throat swelling, SOB or lightheadedness with hypotension: Yes Has patient had a PCN reaction causing severe rash involving mucus membranes or skin necrosis: No Has patient had a PCN reaction that required hospitalization No Has patient had a PCN reaction occurring within the last 10 years: No If all of the above answers are "NO", then may proceed with Cephalosporin use.      Current Outpatient Medications  Medication Sig Dispense Refill   aspirin EC 81 MG tablet Take 81 mg by mouth daily. Swallow whole.     Cobalamin Combinations (NEURIVA PLUS PO) Take 1 capsule by mouth daily.     fenofibrate (TRICOR) 48 MG tablet Take 1 tablet (48 mg total) by mouth daily. 90 tablet 3   Galcanezumab-gnlm (EMGALITY) 120 MG/ML SOAJ Inject 120 mg into the skin every 30 (thirty) days. 1.12 mL 11   hydrocortisone (CORTEF) 20 MG tablet Take 0.5-1 tablets (10-20 mg total) by mouth 2 (two) times daily. Take 1 tablet every morning and 1/2 tablet every evening 45 tablet o   levothyroxine (SYNTHROID) 75 MCG tablet Take 1 tablet (75 mcg total) by mouth daily. 30 tablet 2   Melatonin 10 MG TABS Take 1 tablet by mouth at bedtime. 90 tablet 3   metoprolol tartrate (LOPRESSOR) 25 MG tablet Take 1 tablet (25 mg total) by mouth 2 (two) times daily. 180 tablet 3    nitroGLYCERIN (NITROSTAT) 0.4 MG SL tablet Place 1 tablet (0.4 mg total) under the tongue every 5 (five) minutes x 3 doses as needed for chest pain. 25 tablet 12   omega-3 acid ethyl esters (LOVAZA) 1 g capsule Take 1 capsule (1 g total) by mouth 2 (two) times daily. 60 capsule 11   omeprazole (PRILOSEC) 20 MG capsule Take 20 mg by mouth 2 (two) times daily.     Rimegepant Sulfate (NURTEC) 75 MG TBDP Take 75 mg by mouth daily as needed. For migraines. Take as close to onset of migraine as possible. One daily maximum. (Patient taking differently: Take 75 mg by mouth daily as needed (migraine). For migraines. Take as close to onset of migraine as possible. One daily maximum.) 8 tablet 6   rosuvastatin (CRESTOR) 10 MG tablet Take 1 tablet (10 mg total) by mouth daily. 30 tablet 11   Tetrahydrozoline-Zn Sulfate (VISINE-AC OP) Apply 1 drop to eye daily as needed (dry eyes).     venlafaxine XR (EFFEXOR-XR) 150 MG 24 hr capsule Take 1 capsule (150 mg total) by mouth daily. 30 capsule 11   No current facility-administered medications for this visit.     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   Past Surgical History:  Procedure Laterality Date   ADRENAL GLAND SURGERY  1982   on hydrocortisone   ATRIAL MYXOMA EXCISION  1978   BREAST BIOPSY  07/1999    ductal tissue excision & bx right; bx only left breast   EYE SURGERY     LEFT HEART CATHETERIZATION WITH CORONARY ANGIOGRAM N/A 10/06/2011   Procedure: LEFT HEART CATHETERIZATION WITH CORONARY ANGIOGRAM;  Surgeon: Clent Demark, MD;  Location: [facility];  Service: Cardiovascular;  Laterality: N/A;   repaired cross eyed     "as a child"   ROTATOR CUFF REPAIR  05/2005   left   VAGINAL HYSTERECTOMY  2000   "partial; w/left ovary"     Family History  Problem Relation Age of Onset   Colon cancer Sister 64   Acromegaly Father    Heart disease Father        Tumor in the heart   Stomach cancer Father    Diabetes Sister    Kidney disease Sister        x2   Heart disease Sister        Tumor in the heart   Heart disease Daughter        Tumor in the heart   Colon polyps Neg Hx    Esophageal cancer Neg Hx    Gallbladder disease Neg Hx    Migraines Neg Hx      Social History   Socioeconomic History   Marital status: Married    Spouse name: Not on file   Number of children: 2   Years of education: Not on file   Highest education level: Not on file  Occupational History   Occupation: Disabled  Tobacco Use   Smoking status: Never   Smokeless tobacco: Never  Vaping Use   Vaping Use: Never used  Substance and Sexual Activity   Alcohol use: No   Drug use: No   Sexual activity: Yes  Other Topics Concern   Not on file  Social History Narrative   Lives with sister in law right now   Left handed   Caffeine: none    Social Determinants of Health   Financial Resource Strain: Not on file  Food Insecurity: Not on file  Transportation Needs: Not on file  Physical Activity: Not on file  Stress: Not on file  Social Connections: Not on file  Intimate Partner Violence: Not on file     BP (!) 96/52   Pulse 83   Ht 5\' 2"  (1.575 m)   Wt 204 lb 6.4 oz (92.7 kg)   SpO2 96%   BMI 37.39 kg/m   Physical Exam:  Well appearing NAD HEENT: Unremarkable Neck:  No JVD, no  thyromegally Lymphatics:  No adenopathy Back:  No CVA tenderness Lungs:  Clear with no wheezes HEART:  Regular rate rhythm, no murmurs, no rubs, no clicks Abd:  soft, positive bowel sounds, no organomegally, no rebound, no guarding Ext:  2 plus pulses, no edema, no cyanosis, no clubbing Skin:  No rashes no nodules Neuro:  CN II through XII intact, motor grossly intact  EKG - NSR  Assess/Plan:  Symptomatic NSVT/PVC' s- I discussed the treatment options including watchful waiting vs initiation of flecainide. She will continue her metoprolol as well. She is symptomatic and would like to start/try the flecainide. We will have her return in 2 weeks for an ECG. Atypical chest pain - she is pending a CT scan. Unless she has obstructive CAD, I would continue the flecainide.  Syncope - she does not give me much detail on this. None recently.   Carleene Overlie Jo-Anne Kluth,MD

## 2021-03-08 NOTE — Telephone Encounter (Signed)
Elouise Munroe, MD  03/07/2021  9:15 AM EDT      Normal coronary CTA, this is great news. No sign of recurrent myxoma either.    If she wants, we can push out our appt on Wednesday and let her go see EP on Thursday first. I can see her back in 4-6 weeks since it seems like the mostimportant issue at this point is to review the NSVT with EP.     Spoke to patient, aware of results and verbalized understanding.     Appt with EP today.  Cancelled appt with Dr. Margaretann Loveless and will need to be rescheduled 4-6 weeks out.

## 2021-03-08 NOTE — Telephone Encounter (Signed)
Patient returning call for CT results. 

## 2021-03-09 ENCOUNTER — Ambulatory Visit: Payer: Medicaid Other | Admitting: Internal Medicine

## 2021-03-21 ENCOUNTER — Ambulatory Visit: Payer: Medicaid Other

## 2021-03-23 ENCOUNTER — Other Ambulatory Visit: Payer: Self-pay | Admitting: Optometrist

## 2021-03-23 DIAGNOSIS — H02843 Edema of right eye, unspecified eyelid: Secondary | ICD-10-CM

## 2021-03-30 ENCOUNTER — Institutional Professional Consult (permissible substitution): Payer: Medicaid Other | Admitting: Cardiology

## 2021-04-10 ENCOUNTER — Other Ambulatory Visit: Payer: Self-pay

## 2021-04-10 ENCOUNTER — Ambulatory Visit
Admission: RE | Admit: 2021-04-10 | Discharge: 2021-04-10 | Disposition: A | Discharge status: Home / Self Care | Payer: Medicaid Other | Source: Ambulatory Visit | Attending: Optometrist | Admitting: Optometrist

## 2021-04-10 DIAGNOSIS — H0589 Other disorders of orbit: Secondary | ICD-10-CM | POA: Diagnosis not present

## 2021-04-10 DIAGNOSIS — H02843 Edema of right eye, unspecified eyelid: Secondary | ICD-10-CM

## 2021-04-10 MED ORDER — GADOBENATE DIMEGLUMINE 529 MG/ML IV SOLN
19.0000 mL | Freq: Once | INTRAVENOUS | Status: AC | PRN
  Administered 2021-04-10: 19 mL via INTRAVENOUS

## 2021-04-19 ENCOUNTER — Other Ambulatory Visit: Payer: Self-pay | Admitting: Neurology

## 2021-04-19 ENCOUNTER — Other Ambulatory Visit: Payer: Self-pay | Admitting: Nurse Practitioner

## 2021-04-28 DIAGNOSIS — H0589 Other disorders of orbit: Secondary | ICD-10-CM | POA: Diagnosis not present

## 2021-04-30 ENCOUNTER — Emergency Department (HOSPITAL_COMMUNITY)
Admission: EM | Admit: 2021-04-30 | Discharge: 2021-04-30 | Disposition: A | Discharge status: Home / Self Care | Payer: Medicaid Other | Attending: Emergency Medicine | Admitting: Emergency Medicine

## 2021-04-30 ENCOUNTER — Encounter (HOSPITAL_COMMUNITY): Payer: Self-pay | Admitting: *Deleted

## 2021-04-30 ENCOUNTER — Other Ambulatory Visit: Payer: Self-pay

## 2021-04-30 DIAGNOSIS — M79602 Pain in left arm: Secondary | ICD-10-CM | POA: Insufficient documentation

## 2021-04-30 DIAGNOSIS — M549 Dorsalgia, unspecified: Secondary | ICD-10-CM | POA: Insufficient documentation

## 2021-04-30 DIAGNOSIS — Z79899 Other long term (current) drug therapy: Secondary | ICD-10-CM | POA: Diagnosis not present

## 2021-04-30 DIAGNOSIS — M79609 Pain in unspecified limb: Secondary | ICD-10-CM

## 2021-04-30 DIAGNOSIS — R0902 Hypoxemia: Secondary | ICD-10-CM | POA: Diagnosis not present

## 2021-04-30 DIAGNOSIS — M545 Low back pain, unspecified: Secondary | ICD-10-CM | POA: Diagnosis not present

## 2021-04-30 DIAGNOSIS — E039 Hypothyroidism, unspecified: Secondary | ICD-10-CM | POA: Insufficient documentation

## 2021-04-30 DIAGNOSIS — J45909 Unspecified asthma, uncomplicated: Secondary | ICD-10-CM | POA: Insufficient documentation

## 2021-04-30 DIAGNOSIS — Z8616 Personal history of COVID-19: Secondary | ICD-10-CM | POA: Insufficient documentation

## 2021-04-30 DIAGNOSIS — M79604 Pain in right leg: Secondary | ICD-10-CM | POA: Diagnosis not present

## 2021-04-30 DIAGNOSIS — M79601 Pain in right arm: Secondary | ICD-10-CM | POA: Insufficient documentation

## 2021-04-30 MED ORDER — HYDROMORPHONE HCL 1 MG/ML IJ SOLN
1.0000 mg | Freq: Once | INTRAMUSCULAR | Status: AC
  Administered 2021-04-30: 1 mg via INTRAMUSCULAR
  Filled 2021-04-30: qty 1

## 2021-04-30 MED ORDER — HYDROCODONE-ACETAMINOPHEN 5-325 MG PO TABS: 1.0000 | ORAL_TABLET | Freq: Four times a day (QID) | ORAL | 0 refills | Status: DC | PRN

## 2021-04-30 MED ORDER — METHYLPREDNISOLONE 4 MG PO TBPK: ORAL_TABLET | ORAL | 0 refills | Status: DC

## 2021-04-30 MED ORDER — METHOCARBAMOL 500 MG PO TABS
750.0000 mg | ORAL_TABLET | Freq: Once | ORAL | Status: AC
  Administered 2021-04-30: 750 mg via ORAL
  Filled 2021-04-30: qty 2

## 2021-04-30 MED ORDER — METHOCARBAMOL 500 MG PO TABS: 500.0000 mg | ORAL_TABLET | Freq: Four times a day (QID) | ORAL | 0 refills | Status: DC

## 2021-04-30 MED ORDER — DEXAMETHASONE SODIUM PHOSPHATE 10 MG/ML IJ SOLN
10.0000 mg | Freq: Once | INTRAMUSCULAR | Status: AC
  Administered 2021-04-30: 10 mg via INTRAMUSCULAR
  Filled 2021-04-30: qty 1

## 2021-04-30 NOTE — ED Provider Notes (Signed)
[facility] EMERGENCY DEPARTMENT Provider Note   CSN: [id number] Arrival date & time: 04/30/21  1106     History Chief Complaint  Patient presents with   Back Pain    [name] is a 66 y.o. female.  HPI Patient reports yesterday her husband had fallen or was falling and she was trying to help him and pull him up.  She reports that she got a sharp pain in her back and felt something pull.  She reports it was pretty painful at that time but overnight it became much worse and she started to get a lot of pain radiating throughout her back but also radiating into her right arm and leg.  She reports they are not numb and they do not feel weak but is just so painful to move that it makes it worse.  She has not tried anything for relief at home.  He denies any chest pain or shortness of breath.  No abdominal pain.  No bowel or bladder dysfunction.  She reports a while after she started with all that pain she also got a general headache.  Patient reports is not unusual for her to get headaches, she reports she gets migraines and this headache does not seem atypical.    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[place])    Hemorrhoids    Hyperlipemia    Hypothyroidism    per office note dated 03/25/2014   Migraine    Peptic ulcer associated with Helicobacter pylori infection    Reflux    Stroke ([place]) 1978   "piece tumor broke off & went to left eye; leaving me blind"    Patient Active Problem List   Diagnosis Date Noted   NSVT (nonsustained ventricular tachycardia) ([place]) 03/08/2021   New-onset angina ([place]) 12/04/2020   Chronic migraine without aura,  with intractable migraine, so stated, with status migrainosus 05/25/2020   Frequent PVCs    COVID-19 10/10/2019   Hypoxia    Migraine with aura and without status migrainosus, not intractable    Tachycardia    ARF (acute renal failure) ([place]) 12/06/2018   Fever 12/06/2018   Acute hypoxemic respiratory failure ([place]) 12/06/2018   Headache 11/02/2013   Dizziness 11/02/2013   Anemia 01/10/2013   Chest pain, unspecified 01/10/2013   Adjustment disorder with mixed anxiety and depressed mood 01/10/2013   Adrenal crisis ([place]) 01/07/2013   Nausea & vomiting 08/03/2011   Hypotension 08/03/2011   Adrenal insufficiency ([place]) 08/03/2011   Hyponatremia 08/03/2011   Hypothyroidism 08/03/2011   Depression 08/03/2011    Past Surgical History:  Procedure Laterality Date   ADRENAL GLAND SURGERY  1982   on hydrocortisone   ATRIAL MYXOMA EXCISION  1978   BREAST BIOPSY  07/1999   ductal tissue excision & bx right; bx only left breast   EYE SURGERY     LEFT HEART CATHETERIZATION WITH CORONARY ANGIOGRAM N/A 10/06/2011   Procedure: LEFT HEART CATHETERIZATION WITH CORONARY ANGIOGRAM;  Surgeon: Clent Demark, MD;  Location: [facility]ATH [facility];  Service: Cardiovascular;  Laterality: N/A;   repaired cross eyed     "as a child"   ROTATOR CUFF REPAIR  05/2005   left   VAGINAL HYSTERECTOMY  2000   "partial; w/left ovary"     OB History   No obstetric history on file.     Family History  Problem Relation Age of Onset   Colon cancer Sister 59   Acromegaly Father    Heart disease Father        Tumor in the heart   Stomach cancer Father    Diabetes Sister    Kidney disease Sister        x2   Heart disease Sister        Tumor in the heart   Heart disease Daughter        Tumor in the heart   Colon polyps Neg Hx    Esophageal cancer Neg Hx    Gallbladder disease Neg Hx    Migraines Neg Hx     Social History   Tobacco Use   Smoking status: Never   Smokeless tobacco: Never  Vaping Use   Vaping  Use: Never used  Substance Use Topics   Alcohol use: No   Drug use: No    Home Medications Prior to Admission medications   Medication Sig Start Date End Date Taking? Authorizing Provider  HYDROcodone-acetaminophen (NORCO/VICODIN) 5-325 MG tablet Take 1-2 tablets by mouth every 6 (six) hours as needed for moderate pain or severe pain. 04/30/21  Yes Charlesetta Shanks, MD  methocarbamol (ROBAXIN) 500 MG tablet Take 1 tablet (500 mg total) by mouth 4 (four) times daily. 04/30/21  Yes Charlesetta Shanks, MD  methylPREDNISolone (MEDROL DOSEPAK) 4 MG TBPK tablet Take per Dosepak instructions.  Start 8/22 am 04/30/21  Yes Charlesetta Shanks, MD  aspirin EC 81 MG tablet Take 81 mg by mouth daily. Swallow whole.    [provider]  Cobalamin Combinations (NEURIVA PLUS PO) Take 1 capsule by mouth daily.    [provider]  fenofibrate (TRICOR) 48 MG tablet Take 1 tablet (48 mg total) by mouth daily. 11/25/20   Vevelyn Francois, NP  flecainide (TAMBOCOR) 50 MG tablet Take 1 tablet (50 mg total) by mouth 2 (two) times daily. 03/08/21   Evans Lance, MD  Galcanezumab-gnlm [facility]) 120 MG/ML SOAJ Inject 120 mg into the skin every 30 (thirty) days. 05/25/20   Melvenia Beam, MD  hydrocortisone (CORTEF) 20 MG tablet Take 0.5-1 tablets (10-20 mg total) by mouth 2 (two) times daily. Take 1 tablet every morning and 1/2 tablet every evening [id number]   Delora Fuel, MD  levothyroxine (SYNTHROID) 75 MCG tablet Take 1 tablet (75 mcg total) by mouth daily. 04/19/21 04/19/22  Vevelyn Francois, NP  Melatonin 10 MG TABS Take 1 tablet by mouth at bedtime. 11/08/20 11/08/21  Vevelyn Francois, NP  metoprolol tartrate (LOPRESSOR) 25 MG tablet Take 1 tablet (25 mg total) by mouth 2 (two) times daily. 02/18/21 05/19/21  Elouise Munroe, MD  nitroGLYCERIN (NITROSTAT) 0.4 MG SL tablet Place 1 tablet (0.4 mg total) under the tongue every 5 (five) minutes x 3 doses as needed for chest pain. 12/05/20   Charolette Forward, MD  omega-3  acid ethyl esters (LOVAZA) 1 g capsule Take 1 capsule (1 g total) by mouth 2 (two) times daily. 11/25/20 11/25/21  Vevelyn Francois, NP  omeprazole (PRILOSEC) 20 MG capsule Take 20 mg by mouth 2 (two) times daily. 11/15/20  [provider]  Rimegepant Sulfate (NURTEC) 75 MG TBDP Take 75 mg by mouth daily as needed. For migraines. Take as close to onset of migraine as possible. One daily maximum. Patient taking differently: Take 75 mg by mouth daily as needed (migraine). For migraines. Take as close to onset of migraine as possible. One daily maximum. 05/25/20   Melvenia Beam, MD  rosuvastatin (CRESTOR) 10 MG tablet Take 1 tablet (10 mg total) by mouth daily. 11/25/20 11/25/21  Vevelyn Francois, NP  Tetrahydrozoline-Zn Sulfate (VISINE-AC OP) Apply 1 drop to eye daily as needed (dry eyes).    [provider]  venlafaxine XR (EFFEXOR-XR) 150 MG 24 hr capsule Take 1 capsule (150 mg total) by mouth daily. 11/25/20 11/25/21  Vevelyn Francois, NP    Allergies    Aspirin, Ibuprofen [ibuprofen], and Penicillins  Review of Systems   Review of Systems 10 systems reviewed and negative except as per HPI Physical Exam Updated Vital Signs BP 107/68 (BP Location: Right Arm)   Pulse 92   Temp 98.4 F (36.9 C) (Oral)   Resp 15   Ht 5' (1.524 m)   Wt 95.3 kg   SpO2 97%   BMI 41.01 kg/m   Physical Exam Constitutional:      Comments: Patient is alert and nontoxic.  She does appear to be in severe pain.  She is lying on her left side no respiratory distress  HENT:     Head: Normocephalic and atraumatic.     Mouth/Throat:     Pharynx: Oropharynx is clear.  Cardiovascular:     Rate and Rhythm: Normal rate and regular rhythm.  Pulmonary:     Effort: Pulmonary effort is normal.     Breath sounds: Normal breath sounds.  Abdominal:     General: There is no distension.     Palpations: Abdomen is soft.     Tenderness: There is no abdominal tenderness. There is no guarding.  Musculoskeletal:      Cervical back: Neck supple.     Comments: Patient endorses discomfort to palpation essentially throughout the entirety of her back.  She endorses discomfort from her trapezius bilateral down the paraspinous area and specifically tender in the right SI joint region.  There is no focal localization for bony point tenderness.  Patient's extremities are symmetric.  Radial pulses 2+ and pedal pulses 2+.  Sensation intact to light touch x4 extremities patient does not notice any difference between light touch.  Motor strength 5\5 upper and lower extremities  Skin:    General: Skin is warm and dry.  Neurological:     General: No focal deficit present.     Mental Status: She is oriented to person, place, and time.     Motor: No weakness.     Coordination: Coordination normal.    ED Results / Procedures / Treatments   Labs (all labs ordered are listed, but only abnormal results are displayed) Labs Reviewed - No data to display  EKG None  Radiology No results found.  Procedures Procedures   Medications Ordered in ED Medications  HYDROmorphone (DILAUDID) injection 1 mg (1 mg Intramuscular Given 04/30/21 1314)  dexamethasone (DECADRON) injection 10 mg (10 mg Intramuscular Given 04/30/21 1314)  methocarbamol (ROBAXIN) tablet 750 mg (750 mg Oral Given 04/30/21 1313)    ED Course  I have reviewed the triage vital signs and the nursing notes.  Pertinent labs & imaging results that were available during my care of the patient were  reviewed by me and considered in my medical decision making (see chart for details).    MDM Rules/Calculators/A&P                           Patient had an injury from trying to pull her husband up.  She also is experiencing pain in both the right arm and leg.  However there is no weakness present.  No localization within the back for the neck or focus of pain.  Patient is not diabetic.  She was treated with Decadron Dilaudid and Robaxin.  With reassessment she  had significant improvement in pain and is now able to lie on her back without difficulty.  We were able to reassess for extremity strength and she does not have any pain with range of motion of the extremities and intact motor strength.  Return precautions reviewed for weakness numbness or other concerning symptoms Final Clinical Impression(s) / ED Diagnoses Final diagnoses:  Other acute back pain  Acute extremity pain    Rx / DC Orders ED Discharge Orders          Ordered    HYDROcodone-acetaminophen (NORCO/VICODIN) 5-325 MG tablet  Every 6 hours PRN        04/30/21 1519    methylPREDNISolone (MEDROL DOSEPAK) 4 MG TBPK tablet        04/30/21 1519    methocarbamol (ROBAXIN) 500 MG tablet  4 times daily        04/30/21 1519             Charlesetta Shanks, MD 04/30/21 1529

## 2021-04-30 NOTE — Discharge Instructions (Addendum)
1.  You were given a steroid shot called Decadron in the emergency department.  You are to start your steroid pack called methylprednisolone (Medrol Dosepak) 8\22 in the AM.  You can take Vicodin tablets for additional pain control.  You may take 1-2 every 6 hours.  If you tend to get constipated, take an over-the-counter stool softener such as Colace to avoid constipation from narcotic pain (like Vicodin) medications.  Robaxin is a muscle relaxer.  You may take this every 6 hours as needed. 2.  Schedule a follow-up appointment for recheck with your doctor within the next 3 to 5 days 3.  Return to the emergency department if you develop any numbness weakness or dysfunction of your arm or leg, other concerning symptoms such as chest pain abdominal pain or fevers.

## 2021-04-30 NOTE — ED Triage Notes (Signed)
Patient presents to ed via [facility] states she was trying to help her husband off the floor 3 days ago and flet something pull in her back. C/o numbness and pain in right leg and right arm. Denies incont. Bialteral pedal and radial pulses present. A/O x 4c/o increased pain with walking

## 2021-05-02 ENCOUNTER — Telehealth: Payer: Self-pay

## 2021-05-02 NOTE — Telephone Encounter (Signed)
Transition Care Management Unsuccessful Follow-up Telephone Call  Date of discharge and from where:  04/30/2021-[facility]   Attempts:  1st Attempt  Reason for unsuccessful TCM follow-up call:  Left voice message

## 2021-05-03 ENCOUNTER — Telehealth: Payer: Self-pay | Admitting: Internal Medicine

## 2021-05-03 NOTE — Telephone Encounter (Signed)
Left message for pt to call back and schedule an appt for pre op clearance. Appt can be with Dr. Margaretann Loveless or any APP.

## 2021-05-03 NOTE — Telephone Encounter (Signed)
Transition Care Management Unsuccessful Follow-up Telephone Call  Date of discharge and from where:  04/30/2021 from [facility]  Attempts:  2nd Attempt  Reason for unsuccessful TCM follow-up call:  Left voice message

## 2021-05-03 NOTE — Telephone Encounter (Signed)
   [facility] Pre-operative Risk Assessment    Request for surgical clearance:  What type of surgery is being performed? Right Orbital Mass Excision  When is this surgery scheduled? TBD  What type of clearance is required (medical clearance vs. Pharmacy clearance to hold med vs. Both)? Both  Are there any medications that need to be held prior to surgery and how long?Aspirin 7-10 days before  Practice name and name of physician performing surgery?  Dr. Juanda Crumble Rice/ [facility] in [place]  What is your office phone number [phone]   7.   What is your office fax number [phone]  8.   Anesthesia type (None, local, MAC, general) ? General   FYI pt is completely Blind in her left and surgery is on the right eye   Larina Bras 05/03/2021, 4:07 PM  _________________________________________________________________   (provider comments below)

## 2021-05-03 NOTE — Telephone Encounter (Signed)
   Name: CRYSTALROSE WASHER  DOB: [birth year]  MRN: [id number]  Primary Cardiologist: Dr. Margaretann Loveless (primary), Dr. Lovena Le (EP)  Chart reviewed as part of pre-operative protocol coverage. Because of Meshell C Westergren's past medical history and time since last visit, she will require a follow-up visit in order to better assess preoperative cardiovascular risk.  Chart reviewed as part of pre-operative protocol coverage. Patient was contacted 05/03/2021 in reference to pre-operative risk assessment for pending surgery as outlined below.  JERMANY WAN was last seen 02/18/21 by Dr. Margaretann Loveless and 03/08/21 by Dr. 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 Main issue in recent notes has been related to syncope, VT, PVCs. Cor CT 03/03/21 showed no evidence of CAD. 2D echo 02/04/21 EF normal without evidence of recurrent myxoma. At last OV with EP, she was started on flecainide and was supposed to return for nurse visit with EKG but was a no show.  Given need for flecainide follow-up and need for clearance for general anesthesia, recommend return for OV.  Pre-op covering staff: - Please schedule appointment and call patient to inform them.  - Please contact requesting surgeon's office via preferred method (i.e, phone, fax) to inform them of need for appointment prior to surgery.  This message will also be routed to primary cardiologist for input on holding ASPIRIN for 7-10 days as requested below so that this information is available to the clearing provider at time of patient's appointment. Dr. Margaretann Loveless, please review and comment but do not need to route - it will simply be available at time of follow-up OV for provider/APP to reference.  Charlie Pitter, PA-C  05/03/2021, 5:14 PM

## 2021-05-04 NOTE — Telephone Encounter (Signed)
Called and spoke with patient and patients son (okay per DPR), made patient an appointment to see Dr. Margaretann Loveless on Friday 8/26 at 8:20am for Pre-op clearance. Advised patient and patients son to call back to office with any issues, questions, or concerns. Patient verbalized understanding.

## 2021-05-04 NOTE — Telephone Encounter (Signed)
Transition Care Management Unsuccessful Follow-up Telephone Call  Date of discharge and from where:  04/30/2021-[facility]   Attempts:  3rd Attempt  Reason for unsuccessful TCM follow-up call:  Left voice message

## 2021-05-06 ENCOUNTER — Other Ambulatory Visit: Payer: Self-pay

## 2021-05-06 ENCOUNTER — Ambulatory Visit: Payer: Medicaid Other | Admitting: Internal Medicine

## 2021-05-06 VITALS — BP 114/72 | HR 68 | Ht 60.0 in | Wt 202.4 lb

## 2021-05-06 DIAGNOSIS — Z86018 Personal history of other benign neoplasm: Secondary | ICD-10-CM

## 2021-05-06 DIAGNOSIS — R55 Syncope and collapse: Secondary | ICD-10-CM | POA: Diagnosis not present

## 2021-05-06 DIAGNOSIS — E785 Hyperlipidemia, unspecified: Secondary | ICD-10-CM

## 2021-05-06 DIAGNOSIS — I472 Ventricular tachycardia, unspecified: Secondary | ICD-10-CM

## 2021-05-06 DIAGNOSIS — Z01810 Encounter for preprocedural cardiovascular examination: Secondary | ICD-10-CM

## 2021-05-06 DIAGNOSIS — R002 Palpitations: Secondary | ICD-10-CM | POA: Diagnosis not present

## 2021-05-06 DIAGNOSIS — I493 Ventricular premature depolarization: Secondary | ICD-10-CM

## 2021-05-06 DIAGNOSIS — I4729 Other ventricular tachycardia: Secondary | ICD-10-CM

## 2021-05-06 DIAGNOSIS — I10 Essential (primary) hypertension: Secondary | ICD-10-CM | POA: Diagnosis not present

## 2021-05-06 DIAGNOSIS — R072 Precordial pain: Secondary | ICD-10-CM

## 2021-05-06 NOTE — Progress Notes (Signed)
Cardiology Office Note:    Date:  05/06/2021   ID:  Olivya, Jim Dec 29, 1955, MRN [id number]  PCP:  Vevelyn Francois, NP  Cardiologist:  None  Electrophysiologist:  None   Referring MD: Vevelyn Francois, NP   Chief Complaint/Reason for Referral: Follow up, preoperative cardiovascular risk assessment.   History of Present Illness:    BERKLEY ROHLIK is a 65 y.o. 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  Recent hospitalization for chest pain radiating to the left arm concerning for cardiac chest pain. She ruled out for MI. Discharged with plan for outpatient follow up. She presents with her son who provides some additional history.   We reviewed the results from her coronary CTA which showed no epicardial coronary artery disease.  She has no coronary calcifications.  No recurrence of myxoma.  For very frequent NSVT I referred her to electrophysiology.  Dr. Lovena Le placed her on flecainide 50 mg twice daily.  She feels "back to her normal self".  Her symptoms of chest pain and palpitations have improved greatly.  She was due for follow-up for flecainide monitoring but does not recall having an appointment to attend.  We completed an EKG today showing normal QRS duration.  This coupled with no epicardial coronary artery disease is somewhat reassuring in the setting of use of flecainide.  I would like for her to follow-up with electrophysiology for drug monitoring.  No interval syncope.  Echocardiogram showed grossly normal biventricular size and function.  I have independently reviewed the images from her coronary CTA demonstrating retained epicardial pacing wires and probable LV apical myocardial defect secondary to likely LV access/vent from prior surgery.  The patient denies chest pain, chest  pressure, dyspnea at rest or with exertion, palpitations, PND, orthopnea, or leg swelling. Denies cough, fever, chills. Denies nausea, vomiting. Denies syncope or presyncope. Denies dizziness or lightheadedness.   She is due to undergo right orbital mass excision at [facility] with Dr. Benjamine Mola.  On exam there is a soft tissue swelling underneath the right eye, this is the area of planned surgery.  She tells me she will have general anesthesia.  We discussed cardiovascular risk factors including prior cardiac surgery with myxoma, prior stroke, hypertension, hyperlipidemia which contribute to risk.  She is now able to complete 4 METS without chest discomfort since starting flecainide.    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[place])    Hemorrhoids    Hyperlipemia    Hypothyroidism    per office note dated 03/25/2014   Migraine    Peptic ulcer associated with Helicobacter pylori infection    Reflux    Stroke ([place]) 1978   "piece tumor broke off & went to left eye; leaving me blind"    Past Surgical History:  Procedure Laterality Date   ADRENAL GLAND SURGERY  1982   on hydrocortisone   ATRIAL MYXOMA  EXCISION  1978   BREAST BIOPSY  07/1999   ductal tissue excision & bx right; bx only left breast   EYE SURGERY     LEFT HEART CATHETERIZATION WITH CORONARY ANGIOGRAM N/A 10/06/2011   Procedure: LEFT HEART CATHETERIZATION WITH CORONARY ANGIOGRAM;  Surgeon: Clent Demark, MD;  Location: [facility];  Service: Cardiovascular;  Laterality: N/A;   repaired cross eyed     "as a child"   ROTATOR CUFF REPAIR  05/2005   left   VAGINAL HYSTERECTOMY  2000   "partial; w/left ovary"     Current Medications: Current Meds  Medication Sig   aspirin EC 81 MG tablet Take 81 mg by mouth daily. Swallow whole.   Cobalamin Combinations (NEURIVA PLUS PO) Take 1 capsule by mouth daily.   fenofibrate (TRICOR) 48 MG tablet Take 1 tablet (48 mg total) by mouth daily.   flecainide (TAMBOCOR) 50 MG tablet Take 1 tablet (50 mg total) by mouth 2 (two) times daily.   Galcanezumab-gnlm (EMGALITY) 120 MG/ML SOAJ Inject 120 mg into the skin every 30 (thirty) days.   HYDROcodone-acetaminophen (NORCO/VICODIN) 5-325 MG tablet Take 1-2 tablets by mouth every 6 (six) hours as needed for moderate pain or severe pain.   hydrocortisone (CORTEF) 20 MG tablet Take 0.5-1 tablets (10-20 mg total) by mouth 2 (two) times daily. Take 1 tablet every morning and 1/2 tablet every evening   levothyroxine (SYNTHROID) 75 MCG tablet Take 1 tablet (75 mcg total) by mouth daily.   Melatonin 10 MG TABS Take 1 tablet by mouth at bedtime.   methocarbamol (ROBAXIN) 500 MG tablet Take 1 tablet (500 mg total) by mouth 4 (four) times daily.   methylPREDNISolone (MEDROL DOSEPAK) 4 MG TBPK tablet Take per Dosepak instructions.  Start 8/22 am   metoprolol tartrate (LOPRESSOR) 25 MG tablet Take 1 tablet (25 mg total) by mouth 2 (two) times daily.   nitroGLYCERIN (NITROSTAT) 0.4 MG SL tablet Place 1 tablet (0.4 mg total) under the tongue every 5 (five) minutes x 3 doses as needed for chest pain.   omega-3 acid ethyl esters (LOVAZA) 1 g capsule Take 1 capsule (1 g total) by mouth 2 (two) times daily.   omeprazole (PRILOSEC) 20 MG capsule Take 20 mg by mouth 2 (two) times daily.   Rimegepant Sulfate (NURTEC) 75 MG TBDP Take 75 mg by mouth daily as needed. For migraines. Take as close to onset of migraine as possible. One daily maximum. (Patient taking differently: Take 75 mg by mouth daily as needed (migraine). For migraines. Take as close to onset of migraine as possible. One daily maximum.)   rosuvastatin (CRESTOR) 10 MG tablet  Take 1 tablet (10 mg total) by mouth daily.   venlafaxine XR (EFFEXOR-XR) 150 MG 24 hr capsule Take 1 capsule (150 mg total) by mouth daily.     Allergies:   Aspirin, Ibuprofen [ibuprofen], and Penicillins   Social History   Tobacco Use   Smoking status: Never   Smokeless tobacco: Never  Vaping Use   Vaping Use: Never used  Substance Use Topics   Alcohol use: No   Drug use: No     Family History: The patient's family history includes Acromegaly in her father; Colon cancer (age of onset: 74) in her sister; Diabetes in her sister; Heart disease in her daughter, father, and sister; Kidney disease in her sister; Stomach cancer in her father. There is no history of Colon polyps, Esophageal cancer, Gallbladder disease, or Migraines.  ROS:  Please see the history of present illness.    All other systems reviewed and are negative.  EKGs/Labs/Other Studies Reviewed:    The following studies were reviewed today:  EKG:  SR with one PVC, nonspecific T wave abnl, QRS 86 ms, Qtc 404 msec  I have independently reviewed the images from CTA coronary 03/03/21.  Recent Labs: 09/07/2020: ALT 21 12/04/2020: Magnesium 1.9; TSH 0.186 02/19/2021: BUN 17; Creatinine, Ser 0.97; Hemoglobin 13.4; Platelets 375; Potassium 5.0; Sodium 134  Recent Lipid Panel    Component Value Date/Time   CHOL 99 12/05/2020 0251   CHOL 164 09/27/2018 1048   TRIG 111 12/05/2020 0251   HDL 37 (L) 12/05/2020 0251   HDL 50 09/27/2018 1048   CHOLHDL 2.7 12/05/2020 0251   VLDL 22 12/05/2020 0251   LDLCALC 40 12/05/2020 0251   LDLCALC 73 09/27/2018 1048    Physical Exam:    VS:  BP 114/72 (BP Location: Right Arm, Patient Position: Sitting, Cuff Size: Large)   Pulse 68   Ht 5' (1.524 m)   Wt 202 lb 6.4 oz (91.8 kg)   SpO2 100%   BMI 39.53 kg/m     Wt Readings from Last 5 Encounters:  05/06/21 202 lb 6.4 oz (91.8 kg)  04/30/21 210 lb (95.3 kg)  03/08/21 204 lb 6.4 oz (92.7 kg)  02/19/21 202 lb (91.6 kg)   02/18/21 202 lb (91.6 kg)    Constitutional: No acute distress Eyes: sclera non-icteric, normal conjunctiva and lids ENMT: normal dentition, moist mucous membranes Cardiovascular: regular rhythm, normal rate, no murmurs. S1 and S2 normal. Radial pulses normal bilaterally. No jugular venous distention.  Respiratory: clear to auscultation bilaterally GI : normal bowel sounds, soft and nontender. No distention.   MSK: extremities warm, well perfused. No edema.  NEURO: grossly nonfocal exam, moves all extremities. PSYCH: alert and oriented x 3, normal mood and affect.   ASSESSMENT:    1. Frequent PVCs   2. NSVT (nonsustained ventricular tachycardia) ([place])   3. Ventricular tachycardia (HCC)   4. Palpitations   5. Syncope and collapse   6. Precordial pain   7. History of atrial myxoma   8. Primary hypertension   9. Hyperlipidemia, unspecified hyperlipidemia type   10. Preoperative cardiovascular examination    PLAN:    Frequent PVCs - Plan: EKG 12-Lead NSVT (nonsustained ventricular tachycardia) (HCC) Ventricular tachycardia (HCC) Palpitations Syncope and collapse -Symptoms are greatly improved on flecainide 50 mg twice daily.  Recommend EP follow-up as was planned previously, ECG reassuring today.  Precordial pain-no coronary artery calcifications or coronary artery disease on recent CCTA.  Symptoms have improved.  History of atrial myxoma-no recurrence, no strokelike symptoms.  Primary hypertension-blood pressure well controlled today on metoprolol 25 mg twice daily.  Hyperlipidemia, unspecified hyperlipidemia type-lipids optimized on rosuvastatin 10 mg daily.  Preoperative cardiovascular examination  The patient is intermediate risk for intermediate risk procedure due to the need for general anesthesia.  Risk is felt to be nonmodifiable and contributed to by factors noted above.  No further cardiovascular testing is required prior to the procedure.  If this level of risk is  acceptable to the patient and surgical team, the patient should be considered optimized from a cardiovascular standpoint.  Patient understands risk stratification and is willing to proceed.  Given no findings of coronary artery disease, patient can stop ASA 81 mg daily prior to surgery 7 days and there is no strong indication to resume afterward.  We discussed this in shared  decision-making with her son in the room as well and we are all in agreement.  Total time of encounter: 30 minutes total time of encounter, including 20 minutes spent in face-to-face patient care on the date of this encounter. This time includes coordination of care and counseling regarding above mentioned problem list. Remainder of non-face-to-face time involved reviewing chart documents/testing relevant to the patient encounter and documentation in the medical record. I have independently reviewed documentation from referring provider.   Cherlynn Kaiser, MD, [place]   Shared Decision Making/Informed Consent:       Medication Adjustments/Labs and Tests Ordered: Current medicines are reviewed at length with the patient today.  Concerns regarding medicines are outlined above.   Orders Placed This Encounter  Procedures   EKG 12-Lead    No orders of the defined types were placed in this encounter.   Patient Instructions  Medication Instructions:  No Changes In Medications at this time.  *If you need a refill on your cardiac medications before your next appointment, please call your pharmacy*  Follow-Up: At [facility], you and your health needs are our priority.  As part of our continuing mission to provide you with exceptional heart care, we have created designated Provider Care Teams.  These Care Teams include your primary Cardiologist (physician) and Advanced Practice Providers (APPs -  Physician Assistants and Nurse Practitioners) who all work together to provide you with the care you  need, when you need it.  We recommend signing up for the patient portal called "MyChart".  Sign up information is provided on this After Visit Summary.  MyChart is used to connect with patients for Virtual Visits (Telemedicine).  Patients are able to view lab/test results, encounter notes, upcoming appointments, etc.  Non-urgent messages can be sent to your provider as well.   To learn more about what you can do with MyChart, go to [email or web address].    Your next appointment:   7 month(s)  The format for your next appointment:   In Person  Provider:   Cherlynn Kaiser, MD

## 2021-05-06 NOTE — Patient Instructions (Signed)
Medication Instructions:  No Changes In Medications at this time.  *If you need a refill on your cardiac medications before your next appointment, please call your pharmacy*  Follow-Up: At [facility], you and your health needs are our priority.  As part of our continuing mission to provide you with exceptional heart care, we have created designated Provider Care Teams.  These Care Teams include your primary Cardiologist (physician) and Advanced Practice Providers (APPs -  Physician Assistants and Nurse Practitioners) who all work together to provide you with the care you need, when you need it.  We recommend signing up for the patient portal called "MyChart".  Sign up information is provided on this After Visit Summary.  MyChart is used to connect with patients for Virtual Visits (Telemedicine).  Patients are able to view lab/test results, encounter notes, upcoming appointments, etc.  Non-urgent messages can be sent to your provider as well.   To learn more about what you can do with MyChart, go to [email or web address].    Your next appointment:   7 month(s)  The format for your next appointment:   In Person  Provider:   Cherlynn Kaiser, MD

## 2021-05-12 DIAGNOSIS — H0589 Other disorders of orbit: Secondary | ICD-10-CM | POA: Insufficient documentation

## 2021-05-19 ENCOUNTER — Other Ambulatory Visit: Payer: Self-pay | Admitting: Neurology

## 2021-05-20 ENCOUNTER — Telehealth: Payer: Self-pay | Admitting: Internal Medicine

## 2021-05-20 ENCOUNTER — Other Ambulatory Visit: Payer: Self-pay

## 2021-05-20 DIAGNOSIS — I472 Ventricular tachycardia, unspecified: Secondary | ICD-10-CM

## 2021-05-20 DIAGNOSIS — R55 Syncope and collapse: Secondary | ICD-10-CM

## 2021-05-20 DIAGNOSIS — E781 Pure hyperglyceridemia: Secondary | ICD-10-CM

## 2021-05-20 MED ORDER — FLECAINIDE ACETATE 50 MG PO TABS: 50.0000 mg | ORAL_TABLET | Freq: Two times a day (BID) | ORAL | 11 refills | Status: DC

## 2021-05-20 MED ORDER — FENOFIBRATE 48 MG PO TABS: 48.0000 mg | ORAL_TABLET | Freq: Every day | ORAL | 3 refills | Status: DC

## 2021-05-20 MED ORDER — METOPROLOL TARTRATE 25 MG PO TABS: 25.0000 mg | ORAL_TABLET | Freq: Two times a day (BID) | ORAL | 3 refills | Status: DC

## 2021-05-20 NOTE — Telephone Encounter (Signed)
*  STAT* If patient is at the pharmacy, call can be transferred to refill team.   1. Which medications need to be refilled? (please list name of each medication and dose if known)  fenofibrate (TRICOR) 48 MG tablet  levothyroxine (SYNTHROID) 75 MCG tablet  HYDROcodone-acetaminophen (NORCO/VICODIN) 5-325 MG tablet flecainide (TAMBOCOR) 50 MG tablet metoprolol tartrate (LOPRESSOR) 25 MG tablet (Expired)  2. Which pharmacy/location (including street and city if local pharmacy) is medication to be sent to? [facility], [place]  3. Do they need a 30 day or 90 day supply?  90 day supply

## 2021-05-23 ENCOUNTER — Telehealth: Payer: Self-pay | Admitting: Internal Medicine

## 2021-05-23 NOTE — Telephone Encounter (Signed)
I faxed Dr. Delphina Cahill note containing clearance.

## 2021-05-23 NOTE — Telephone Encounter (Signed)
   [facility] Pre-operative Risk Assessment    Patient Name: [name]  DOB: 1956/03/28 MRN: [id number]  HEARTCARE STAFF:  - IMPORTANT!!!!!! Under Visit Info/Reason for Call, type in Other and utilize the format Clearance MM/DD/YY or Clearance TBD. Do not use dashes or single digits. - Please review there is not already an duplicate clearance open for this procedure. - If request is for dental extraction, please clarify the # of teeth to be extracted. - If the patient is currently at the dentist's office, call Pre-Op Callback Staff (MA/nurse) to input urgent request.  - If the patient is not currently in the dentist office, please route to the Pre-Op pool.  Request for surgical clearance:  What type of surgery is being performed? Orbital Mass Removed  When is this surgery scheduled? 07-27-21  What type of clearance is required (medical clearance vs. Pharmacy clearance to hold med vs. Both)? Both  Are there any medications that need to be held prior to surgery and how long? Aspirin  Practice name and name of physician performing surgery? Dr Magda Kiel  What is the office phone number? [phone]   7.   What is the office fax number? [phone]  8.   Anesthesia type (None, local, MAC, general) ? General   Glyn Ade 05/23/2021, 9:25 AM  _________________________________________________________________   (provider comments below)

## 2021-05-30 ENCOUNTER — Encounter: Payer: Self-pay | Admitting: Nurse Practitioner

## 2021-05-30 ENCOUNTER — Other Ambulatory Visit: Payer: Self-pay | Admitting: Neurology

## 2021-05-30 ENCOUNTER — Ambulatory Visit (INDEPENDENT_AMBULATORY_CARE_PROVIDER_SITE_OTHER): Payer: Medicaid Other | Admitting: Nurse Practitioner

## 2021-05-30 ENCOUNTER — Encounter: Payer: Self-pay | Admitting: Internal Medicine

## 2021-05-30 ENCOUNTER — Other Ambulatory Visit: Payer: Self-pay

## 2021-05-30 VITALS — BP 108/64 | HR 71 | Temp 97.2°F | Ht 60.0 in | Wt 204.6 lb

## 2021-05-30 DIAGNOSIS — E785 Hyperlipidemia, unspecified: Secondary | ICD-10-CM | POA: Diagnosis not present

## 2021-05-30 DIAGNOSIS — I472 Ventricular tachycardia: Secondary | ICD-10-CM

## 2021-05-30 DIAGNOSIS — Z0000 Encounter for general adult medical examination without abnormal findings: Secondary | ICD-10-CM | POA: Diagnosis not present

## 2021-05-30 DIAGNOSIS — E039 Hypothyroidism, unspecified: Secondary | ICD-10-CM

## 2021-05-30 DIAGNOSIS — R82998 Other abnormal findings in urine: Secondary | ICD-10-CM | POA: Diagnosis not present

## 2021-05-30 DIAGNOSIS — I4729 Other ventricular tachycardia: Secondary | ICD-10-CM

## 2021-05-30 DIAGNOSIS — Z1211 Encounter for screening for malignant neoplasm of colon: Secondary | ICD-10-CM

## 2021-05-30 DIAGNOSIS — Z23 Encounter for immunization: Secondary | ICD-10-CM

## 2021-05-30 LAB — POCT URINALYSIS DIP (CLINITEK)
Glucose, UA: NEGATIVE mg/dL
Ketones, POC UA: NEGATIVE mg/dL
Nitrite, UA: NEGATIVE
POC PROTEIN,UA: NEGATIVE
Spec Grav, UA: >=1.03 — AB (ref 1.010–1.025)
Urobilinogen, UA: 1 E.U./dL
pH, UA: 5.5 (ref 5.0–8.0)

## 2021-05-30 NOTE — Progress Notes (Signed)
[address] [place], [place]  [id number] Phone:  [phone]   Fax:  [phone]   Established Patient Office Visit  Subjective:  Patient ID: [name], female    DOB: [birth year]  Age: 65 y.o. MRN: [id number]  CC:  Chief Complaint  Patient presents with   Follow-up    Pt is here today for her follow up visit. Pt states that she fel two days ago and fell on her face and scraped the left side of her forehead. Pt states that she passed out but not sure what caused it. Pt stated she did go to the ED to be seen.    HPI [name] presents for follow up. 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) (1978).   Hypothyroidism Patient presents for evaluation of thyroid function. She denies any current symptoms. The symptoms are mild.  The problem has been stable.  Previous thyroid studies include TSH, T3 uptake, and total T4.   She is following up with cardiology. She has a long standing cardiac history. She reports that she is  doing well. There has been some changes in her medication regimen. Dr. Lovena Le placed her on flecainide 50 mg twice daily.    She reports removal of a cyst from her right eye in November.   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HCC)    CVA (cerebral infarction)    Depression    Depression    Glucocorticoid  deficiency ([place])    Hemorrhoids    Hyperlipemia    Hypothyroidism    per office note dated 03/25/2014   Migraine    Peptic ulcer associated with Helicobacter pylori infection    Reflux    Stroke ([place]) 1978   "piece tumor broke off & went to left eye; leaving me blind"    Past Surgical History:  Procedure Laterality Date   ADRENAL GLAND SURGERY  1982   on hydrocortisone   [place]  07/1999   ductal tissue excision & bx right; bx only left breast   EYE SURGERY     LEFT HEART CATHETERIZATION WITH CORONARY ANGIOGRAM N/A 10/06/2011   Procedure: LEFT HEART CATHETERIZATION WITH CORONARY ANGIOGRAM;  Surgeon: Clent Demark, MD;  Location: [facility];  Service: Cardiovascular;  Laterality: N/A;   repaired cross eyed     "as a child"   ROTATOR CUFF REPAIR  05/2005   left   VAGINAL HYSTERECTOMY  2000   "partial; w/left ovary"    Family History  Problem Relation Age of Onset   Colon cancer Sister 79   Acromegaly Father    Heart disease Father        Tumor in the heart   Stomach cancer Father    Diabetes Sister    Kidney disease Sister  x2   Heart disease Sister        Tumor in the heart   Heart disease Daughter        Tumor in the heart   Colon polyps Neg Hx    Esophageal cancer Neg Hx    Gallbladder disease Neg Hx    Migraines Neg Hx     Social History   Socioeconomic History   Marital status: Married    Spouse name: Not on file   Number of children: 2   Years of education: Not on file   Highest education level: Not on file  Occupational History   Occupation: Disabled  Tobacco Use   Smoking status: Never   Smokeless tobacco: Never  Vaping Use   Vaping Use: Never used  Substance and Sexual Activity   Alcohol use: No   Drug use: No   Sexual activity: Not Currently  Other Topics Concern   Not on file  Social History Narrative   Lives with sister in law right now   Left handed   Caffeine: none    Social  Determinants of Health   Financial Resource Strain: Not on file  Food Insecurity: Not on file  Transportation Needs: Not on file  Physical Activity: Not on file  Stress: Not on file  Social Connections: Not on file  Intimate Partner Violence: Not on file    Outpatient Medications Prior to Visit  Medication Sig Dispense Refill   Cobalamin Combinations (NEURIVA PLUS PO) Take 1 capsule by mouth daily.     fenofibrate (TRICOR) 48 MG tablet Take 1 tablet (48 mg total) by mouth daily. 90 tablet 3   flecainide (TAMBOCOR) 50 MG tablet Take 1 tablet (50 mg total) by mouth 2 (two) times daily. 60 tablet 11   Galcanezumab-gnlm (EMGALITY) 120 MG/ML SOAJ Inject 120 mg into the skin every 30 (thirty) days. 1.12 mL 11   HYDROcodone-acetaminophen (NORCO/VICODIN) 5-325 MG tablet Take 1-2 tablets by mouth every 6 (six) hours as needed for moderate pain or severe pain. 20 tablet 0   hydrocortisone (CORTEF) 20 MG tablet Take 0.5-1 tablets (10-20 mg total) by mouth 2 (two) times daily. Take 1 tablet every morning and 1/2 tablet every evening 45 tablet o   levothyroxine (SYNTHROID) 75 MCG tablet Take 1 tablet (75 mcg total) by mouth daily. 30 tablet 2   Melatonin 10 MG TABS Take 1 tablet by mouth at bedtime. 90 tablet 3   methocarbamol (ROBAXIN) 500 MG tablet Take 1 tablet (500 mg total) by mouth 4 (four) times daily. 30 tablet 0   methylPREDNISolone (MEDROL DOSEPAK) 4 MG TBPK tablet Take per Dosepak instructions.  Start 8/22 am 21 tablet 0   nitroGLYCERIN (NITROSTAT) 0.4 MG SL tablet Place 1 tablet (0.4 mg total) under the tongue every 5 (five) minutes x 3 doses as needed for chest pain. 25 tablet 12   omega-3 acid ethyl esters (LOVAZA) 1 g capsule Take 1 capsule (1 g total) by mouth 2 (two) times daily. 60 capsule 11   omeprazole (PRILOSEC) 20 MG capsule Take 20 mg by mouth 2 (two) times daily.     Rimegepant Sulfate (NURTEC) 75 MG TBDP Take 75 mg by mouth daily as needed. For migraines. Take as close to onset  of migraine as possible. One daily maximum. (Patient taking differently: Take 75 mg by mouth daily as needed (migraine). For migraines. Take as close to onset of migraine as possible. One daily maximum.) 8 tablet 6   rosuvastatin (CRESTOR)  10 MG tablet Take 1 tablet (10 mg total) by mouth daily. 30 tablet 11   venlafaxine XR (EFFEXOR-XR) 150 MG 24 hr capsule Take 1 capsule (150 mg total) by mouth daily. 30 capsule 11   aspirin EC 81 MG tablet Take 81 mg by mouth daily. Swallow whole. (Patient not taking: Reported on 05/30/2021)     metoprolol tartrate (LOPRESSOR) 25 MG tablet Take 1 tablet (25 mg total) by mouth 2 (two) times daily. 180 tablet 3   No facility-administered medications prior to visit.    Allergies  Allergen Reactions   Aspirin Other (See Comments)    Can take baby aspirin (heart flutters with high doses)   Ibuprofen [Ibuprofen] Nausea And Vomiting   Penicillins Nausea And Vomiting    Has patient had a PCN reaction causing immediate rash, facial/tongue/throat swelling, SOB or lightheadedness with hypotension: Yes Has patient had a PCN reaction causing severe rash involving mucus membranes or skin necrosis: No Has patient had a PCN reaction that required hospitalization No Has patient had a PCN reaction occurring within the last 10 years: No If all of the above answers are "NO", then may proceed with Cephalosporin use.     ROS Review of Systems    Objective:    Physical Exam Constitutional:      Appearance: She is obese.  HENT:     Head: Normocephalic and atraumatic.     Nose: Nose normal.     Mouth/Throat:     Mouth: Mucous membranes are moist.  Eyes:     Comments: Right orbital cyst  Cardiovascular:     Rate and Rhythm: Normal rate and regular rhythm.     Pulses: Normal pulses.     Heart sounds: Normal heart sounds.  Pulmonary:     Effort: Pulmonary effort is normal.     Breath sounds: Normal breath sounds.  Abdominal:     Palpations: Abdomen is soft.   Musculoskeletal:        General: Normal range of motion.     Cervical back: Normal range of motion.  Skin:    General: Skin is warm and dry.     Capillary Refill: Capillary refill takes less than 2 seconds.  Neurological:     General: No focal deficit present.     Mental Status: She is alert and oriented to person, place, and time.  Psychiatric:        Mood and Affect: Mood normal.        Thought Content: Thought content normal.        Judgment: Judgment normal.    BP 108/64   Pulse 71   Temp (!) 97.2 F (36.2 C)   Ht 5' (1.524 m)   Wt 204 lb 9.6 oz (92.8 kg)   SpO2 99%   BMI 39.96 kg/m  Wt Readings from Last 3 Encounters:  05/30/21 204 lb 9.6 oz (92.8 kg)  05/06/21 202 lb 6.4 oz (91.8 kg)  04/30/21 210 lb (95.3 kg)     Health Maintenance Due  Topic Date Due   PAP SMEAR-Modifier  Never done   MAMMOGRAM  08/16/2008   COLONOSCOPY (Pts 45-28yr Insurance coverage will need to be confirmed)  11/16/2019    There are no preventive care reminders to display for this patient.  Lab Results  Component Value Date   TSH 0.186 (L) 12/04/2020   Lab Results  Component Value Date   WBC 6.3 02/19/2021   HGB 13.4 02/19/2021   HCT 41.6 02/19/2021  MCV 92.0 02/19/2021   PLT 375 02/19/2021   Lab Results  Component Value Date   NA 134 (L) 02/19/2021   K 5.0 02/19/2021   CO2 23 02/19/2021   GLUCOSE 111 (H) 02/19/2021   BUN 17 02/19/2021   CREATININE 0.97 02/19/2021   BILITOT 0.3 11/25/2020   ALKPHOS 70 11/25/2020   AST 25 11/25/2020   ALT 21 09/07/2020   PROT 7.3 11/25/2020   ALBUMIN 4.4 11/25/2020   CALCIUM 10.0 02/19/2021   ANIONGAP 8 02/19/2021   EGFR 64 02/02/2021   Lab Results  Component Value Date   CHOL 99 12/05/2020   Lab Results  Component Value Date   HDL 37 (L) 12/05/2020   Lab Results  Component Value Date   LDLCALC 40 12/05/2020   Lab Results  Component Value Date   TRIG 111 12/05/2020   Lab Results  Component Value Date   CHOLHDL 2.7  12/05/2020   No results found for: HGBA1C    Assessment & Plan:   Problem List Items Addressed This Visit       Cardiovascular and Mediastinum   NSVT (nonsustained ventricular tachycardia) (HCC) Stable with current regimen; Metoprolol and Flecainide  Continue to follow up with cardiology as scheduled     Endocrine   Hypothyroidism - Primary (Chronic) Persistent labs pending for dose change   Relevant Orders   TSH   T3   T4, free   Other Visit Diagnoses     Healthcare maintenance       Relevant Orders   POCT URINALYSIS DIP (CLINITEK) (Completed)   Need for influenza vaccination       Screening for colon cancer       Relevant Orders   Ambulatory referral to Gastroenterology   Hyperlipidemia, unspecified hyperlipidemia type     Persistent labs Continue on Rosuvastatin fenofibrate and Lovaza   Relevant Orders   Comp. Metabolic Panel (12)   Lipid panel   Urine white blood cells increased       Relevant Orders   Urine Culture       No orders of the defined types were placed in this encounter.   Follow-up: Return in about 2 months (around 07/30/2021) for [place] [[id number]].    Vevelyn Francois, NP

## 2021-05-31 LAB — COMP. METABOLIC PANEL (12)
AST: 19 IU/L (ref 0–40)
Albumin/Globulin Ratio: 1.7 (ref 1.2–2.2)
Albumin: 4.6 g/dL (ref 3.8–4.8)
Alkaline Phosphatase: 76 IU/L (ref 44–121)
BUN/Creatinine Ratio: 17 (ref 12–28)
BUN: 22 mg/dL (ref 8–27)
Bilirubin Total: 0.3 mg/dL (ref 0.0–1.2)
Calcium: 10.2 mg/dL (ref 8.7–10.3)
Chloride: 100 mmol/L (ref 96–106)
Creatinine, Ser: 1.31 mg/dL — ABNORMAL HIGH (ref 0.57–1.00)
Globulin, Total: 2.7 g/dL (ref 1.5–4.5)
Glucose: 123 mg/dL — ABNORMAL HIGH (ref 65–99)
Potassium: 4.6 mmol/L (ref 3.5–5.2)
Sodium: 139 mmol/L (ref 134–144)
Total Protein: 7.3 g/dL (ref 6.0–8.5)
eGFR: 46 mL/min/{1.73_m2} — ABNORMAL LOW (ref >59)

## 2021-05-31 LAB — T4, FREE: Free T4: 1.54 ng/dL (ref 0.82–1.77)

## 2021-05-31 LAB — LIPID PANEL
Chol/HDL Ratio: 3 ratio (ref 0.0–4.4)
Cholesterol, Total: 138 mg/dL (ref 100–199)
HDL: 46 mg/dL (ref >39)
LDL Chol Calc (NIH): 69 mg/dL (ref 0–99)
Triglycerides: 129 mg/dL (ref 0–149)
VLDL Cholesterol Cal: 23 mg/dL (ref 5–40)

## 2021-05-31 LAB — TSH: TSH: 0.947 u[IU]/mL (ref 0.450–4.500)

## 2021-05-31 LAB — T3: T3, Total: 137 ng/dL (ref 71–180)

## 2021-06-24 NOTE — Telephone Encounter (Signed)
Preoperative team please contact requesting office and resend Dr. Delphina Cahill cardiac clearance note to the below fax number.  Thank you for your help.  I will remove request from the preoperative pool.  Jossie Ng. Debar Plate NP-C    06/24/2021, 1:02 PM [address] Office [phone] Fax [phone]

## 2021-06-24 NOTE — Telephone Encounter (Signed)
Jana Half with Dr. Marveen Reeks office is following up. She states they still have not received our faxed recommendation and she would like to know if someone can send it again.  Fax#: [phone]

## 2021-06-24 NOTE — Telephone Encounter (Signed)
Notes have been re-faxed today as well to requesting office; fax # [phone] for Dr. Magda Kiel.

## 2021-06-27 ENCOUNTER — Other Ambulatory Visit: Payer: Self-pay | Admitting: Neurology

## 2021-06-27 NOTE — Telephone Encounter (Signed)
Jana Half, [facility] from Dr. Juanda Crumble Rice's office called and was inquiring about the clearance for this pt. I stated to her that the clearance had no fax number and was stated that their office can see the notes in [place]. I stated that our office had faxed clearance x 3 to Dr. Magda Kiel and the fax # [phone]. PAC states she is not sure why she cannot see the clearance in Epic. She asked if I could put in a phone note. I stated there is a phone note from 05/23/21 clearance  07/27/21. She stated that the phone note 05/23/21 does not give the clearance information needed. I informed the PAC the reason is as it was stated to see Dr. Delphina Cahill ov note from 05/06/21, giving clearance. I have copied clearance from MD into this note.   Preoperative cardiovascular examination   The patient is intermediate risk for intermediate risk procedure due to the need for general anesthesia.  Risk is felt to be nonmodifiable and contributed to by factors noted above.  No further cardiovascular testing is required prior to the procedure.  If this level of risk is acceptable to the patient and surgical team, the patient should be considered optimized from a cardiovascular standpoint.  Patient understands risk stratification and is willing to proceed.   Given no findings of coronary artery disease, patient can stop ASA 81 mg daily prior to surgery 7 days and there is no strong indication to resume afterward.  We discussed this in shared decision-making with her son in the room as well and we are all in agreement.  I will manually fax this not to [facility] [phone].

## 2021-06-27 NOTE — Telephone Encounter (Signed)
Office called to say that she hasnt received the fax from pre opp. She ask that we can just note the acct if the patient is cleared or not. She states she will be able to see it through the telephone note. Please advise

## 2021-06-28 ENCOUNTER — Other Ambulatory Visit: Payer: Self-pay | Admitting: Neurology

## 2021-07-11 DIAGNOSIS — Z9189 Other specified personal risk factors, not elsewhere classified: Secondary | ICD-10-CM | POA: Diagnosis not present

## 2021-07-11 DIAGNOSIS — G4700 Insomnia, unspecified: Secondary | ICD-10-CM | POA: Diagnosis not present

## 2021-07-11 DIAGNOSIS — R0683 Snoring: Secondary | ICD-10-CM | POA: Diagnosis not present

## 2021-07-11 DIAGNOSIS — G4719 Other hypersomnia: Secondary | ICD-10-CM | POA: Diagnosis not present

## 2021-07-15 ENCOUNTER — Other Ambulatory Visit: Payer: Self-pay | Admitting: Neurology

## 2021-07-15 ENCOUNTER — Other Ambulatory Visit: Payer: Self-pay | Admitting: Nurse Practitioner

## 2021-07-26 ENCOUNTER — Ambulatory Visit (INDEPENDENT_AMBULATORY_CARE_PROVIDER_SITE_OTHER): Payer: Medicaid Other | Admitting: Internal Medicine

## 2021-07-26 ENCOUNTER — Encounter: Payer: Self-pay | Admitting: Internal Medicine

## 2021-07-26 ENCOUNTER — Other Ambulatory Visit: Payer: Self-pay

## 2021-07-26 VITALS — BP 130/72 | HR 73 | Ht 60.0 in | Wt 203.2 lb

## 2021-07-26 DIAGNOSIS — I4729 Other ventricular tachycardia: Secondary | ICD-10-CM

## 2021-07-26 DIAGNOSIS — I493 Ventricular premature depolarization: Secondary | ICD-10-CM

## 2021-07-26 NOTE — Patient Instructions (Addendum)
Medication Instructions:  Your physician recommends that you continue on your current medications as directed. Please refer to the Current Medication list given to you today.  Labwork: None ordered.  Testing/Procedures: None ordered.  Follow-Up: Your physician wants you to follow-up in: one year with Cristopher Peru, MD   Any Other Special Instructions Will Be Listed Below (If Applicable).  If you need a refill on your cardiac medications before your next appointment, please call your pharmacy.

## 2021-07-26 NOTE — Progress Notes (Signed)
HPI [name] returns today for evaluation of PVC"s and NSVT. She is a pleasant 65 yo woman with a h/o HTN, dyslipidemia and remote atrial myxoma s/p resection. She note palpitations. She has not angina. She does have a h/o atypical chest pain. Since starting the flecainide, her palpitations are much improved.  She has a right infraorbital mass and is pending resection tomorrow. Allergies  Allergen Reactions   Aspirin Other (See Comments)    Can take baby aspirin (heart flutters with high doses)   Ibuprofen [Ibuprofen] Nausea And Vomiting   Penicillins Nausea And Vomiting    Has patient had a PCN reaction causing immediate rash, facial/tongue/throat swelling, SOB or lightheadedness with hypotension: Yes Has patient had a PCN reaction causing severe rash involving mucus membranes or skin necrosis: No Has patient had a PCN reaction that required hospitalization No Has patient had a PCN reaction occurring within the last 10 years: No If all of the above answers are "NO", then may proceed with Cephalosporin use.      Current Outpatient Medications  Medication Sig Dispense Refill   aspirin EC 81 MG tablet Take 81 mg by mouth daily. Swallow whole.     Cobalamin Combinations (NEURIVA PLUS PO) Take 1 capsule by mouth daily.     fenofibrate (TRICOR) 48 MG tablet Take 1 tablet (48 mg total) by mouth daily. 90 tablet 3   flecainide (TAMBOCOR) 50 MG tablet Take 1 tablet (50 mg total) by mouth 2 (two) times daily. 60 tablet 11   Galcanezumab-gnlm (EMGALITY) 120 MG/ML SOAJ Inject 120 mg into the skin every 30 (thirty) days. MUST BE SEEN FOR FURTHER REFILLS, CALL [phone]. 1 mL 0   HYDROcodone-acetaminophen (NORCO/VICODIN) 5-325 MG tablet Take 1-2 tablets by mouth every 6 (six) hours as needed for moderate pain or severe pain. 20 tablet 0   hydrocortisone (CORTEF) 20 MG tablet Take 0.5-1 tablets (10-20 mg total) by mouth 2 (two) times daily. Take 1 tablet every morning and 1/2 tablet every  evening 45 tablet o   levothyroxine (SYNTHROID) 75 MCG tablet Take 1 tablet (75 mcg total) by mouth daily. 30 tablet 2   Melatonin 10 MG TABS Take 1 tablet by mouth at bedtime. 90 tablet 3   methocarbamol (ROBAXIN) 500 MG tablet Take 1 tablet (500 mg total) by mouth 4 (four) times daily. 30 tablet 0   methylPREDNISolone (MEDROL DOSEPAK) 4 MG TBPK tablet Take per Dosepak instructions.  Start 8/22 am 21 tablet 0   metoprolol tartrate (LOPRESSOR) 25 MG tablet Take 1 tablet (25 mg total) by mouth 2 (two) times daily. 180 tablet 3   nitroGLYCERIN (NITROSTAT) 0.4 MG SL tablet Place 1 tablet (0.4 mg total) under the tongue every 5 (five) minutes x 3 doses as needed for chest pain. 25 tablet 12   omega-3 acid ethyl esters (LOVAZA) 1 g capsule Take 1 capsule (1 g total) by mouth 2 (two) times daily. 60 capsule 11   omeprazole (PRILOSEC) 20 MG capsule Take 20 mg by mouth 2 (two) times daily.     Rimegepant Sulfate (NURTEC) 75 MG TBDP Take 75 mg by mouth daily as needed. For migraines. Take as close to onset of migraine as possible. One daily maximum. (Patient taking differently: Take 75 mg by mouth daily as needed (migraine). For migraines. Take as close to onset of migraine as possible. One daily maximum.) 8 tablet 6   rosuvastatin (CRESTOR) 10 MG tablet Take 1 tablet (10 mg total) by mouth  daily. 30 tablet 11   venlafaxine XR (EFFEXOR-XR) 150 MG 24 hr capsule Take 1 capsule (150 mg total) by mouth daily. 30 capsule 11   No current facility-administered medications for this visit.     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   Past Surgical History:  Procedure Laterality Date   ADRENAL GLAND SURGERY  1982   on hydrocortisone   ATRIAL MYXOMA EXCISION  1978   BREAST BIOPSY  07/1999   ductal tissue excision & bx right; bx only left breast   EYE SURGERY     LEFT HEART CATHETERIZATION WITH CORONARY ANGIOGRAM N/A 10/06/2011   Procedure: LEFT HEART CATHETERIZATION WITH CORONARY ANGIOGRAM;  Surgeon: Clent Demark, MD;  Location: [facility];  Service: Cardiovascular;  Laterality: N/A;   repaired cross eyed     "as a child"   ROTATOR CUFF REPAIR  05/2005   left   VAGINAL HYSTERECTOMY  2000   "partial; w/left ovary"     Family History  Problem Relation Age of Onset   Colon cancer Sister 27   Acromegaly Father    Heart disease Father        Tumor in the heart   Stomach cancer Father    Diabetes Sister    Kidney disease Sister        x2   Heart disease Sister        Tumor in the heart   Heart disease Daughter        Tumor in the heart   Colon polyps Neg Hx    Esophageal cancer Neg Hx    Gallbladder disease Neg Hx    Migraines Neg Hx      Social History   Socioeconomic History   Marital status: Married    Spouse name: Not on file   Number of children: 2   Years of education: Not on file   Highest education level: Not on file  Occupational History   Occupation: Disabled  Tobacco Use   Smoking status: Never   Smokeless tobacco: Never  Vaping Use   Vaping Use: Never used  Substance and Sexual Activity   Alcohol use: No   Drug use: No   Sexual activity: Not Currently  Other Topics Concern   Not on file  Social History Narrative   Lives with sister in law right now   Left handed   Caffeine: none    Social  Determinants of Health   Financial Resource Strain: Not on file  Food Insecurity: Not on file  Transportation Needs: Not on file  Physical Activity: Not on file  Stress: Not on file  Social Connections: Not on file  Intimate Partner Violence: Not on file     BP 130/72   Pulse 73   Ht 5' (1.524 m)   Wt 203 lb 3.2 oz (92.2 kg)   SpO2 96%   BMI 39.68 kg/m   Physical Exam:  Well appearing NAD HEENT: Unremarkable Neck:  No JVD, no thyromegally Lymphatics:  No adenopathy Back:  No CVA tenderness Lungs:  Clear HEART:  Regular rate rhythm, no murmurs, no rubs, no clicks Abd:  soft, positive bowel sounds, no organomegally, no rebound, no guarding Ext:  2 plus pulses, no edema, no cyanosis, no clubbing Skin:  No rashes no nodules Neuro:  CN II through XII intact, motor grossly intact  EKG - NSR   Assess/Plan:  Obesity - she is encouraged to reach out to her primary to learn about weight loss options Symptomatic NSVT/PVC' s- She will continue her flecainide which appears to be working well. Atypical chest pain - she has a negative coronary CT Syncope - None recently.    Mikayla Overlie Burgundy Matuszak,MD

## 2021-07-27 DIAGNOSIS — H0589 Other disorders of orbit: Secondary | ICD-10-CM | POA: Diagnosis not present

## 2021-07-27 DIAGNOSIS — D481 Neoplasm of uncertain behavior of connective and other soft tissue: Secondary | ICD-10-CM | POA: Diagnosis not present

## 2021-07-27 HISTORY — PX: ORBITOTOMY: SHX434

## 2021-07-28 ENCOUNTER — Other Ambulatory Visit: Payer: Self-pay | Admitting: Neurology

## 2021-08-01 ENCOUNTER — Other Ambulatory Visit: Payer: Self-pay

## 2021-08-01 ENCOUNTER — Ambulatory Visit (AMBULATORY_SURGERY_CENTER): Payer: Medicaid Other | Admitting: *Deleted

## 2021-08-01 ENCOUNTER — Telehealth: Payer: Self-pay | Admitting: *Deleted

## 2021-08-01 VITALS — Ht 61.0 in | Wt 201.0 lb

## 2021-08-01 DIAGNOSIS — Z800 Family history of malignant neoplasm of digestive organs: Secondary | ICD-10-CM

## 2021-08-01 MED ORDER — NA SULFATE-K SULFATE-MG SULF 17.5-3.13-1.6 GM/177ML PO SOLN: 1.0000 | Freq: Once | ORAL | 0 refills | Status: AC

## 2021-08-01 NOTE — Progress Notes (Signed)
No egg or soy allergy known to patient  No issues known to pt with past sedation with any surgeries or procedures Patient denies ever being told they had issues or difficulty with intubation  No FH of Malignant Hyperthermia Pt is not on diet pills Pt is not on  home 02  Pt is not on blood thinners  Pt STATES  issues with constipation - SOMETIMES DIET CHANGES HELP EASE HER CONSTIPATION - eats greens or fudge ice cream and this causes BM's - she has no BM daily- she will go 2 x a week- stools are not always hard- occ hard- but not daily  No A fib or A flutter  Pt is NOT vaccinated  for Covid    Due to the COVID-19 pandemic we are asking patients to follow certain guidelines in PV and the [place]   Pt aware of COVID protocols and [facility] guidelines  PV completed over the phone. Pt verified name, DOB, address and insurance during PV today.  Pt mailed instruction packet with copy of consent form to read and not return, and instructions.    Pt encouraged to call with questions or issues.  If pt has My chart, procedure instructions sent via My Chart

## 2021-08-01 NOTE — Telephone Encounter (Signed)
Dr Henrene Pastor,  This pt had a PV with me today.  On 11-16 she had an Orbitotomy right eye for an orbital mass. She states she has no issues since surgery,  she is doing great. She saw cardiology 07-26-2021, with improvement in her PVC's, no new cardiac issues .  I just want to make sure she is okay to proceed with  her Colon 12-5 for a [facility] sister with her recent surgery   Thanks for your time, Marijean Niemann

## 2021-08-02 NOTE — Telephone Encounter (Signed)
Ok to proceed. Thanks

## 2021-08-02 NOTE — Telephone Encounter (Signed)
noted 

## 2021-08-15 ENCOUNTER — Other Ambulatory Visit: Payer: Self-pay | Admitting: Neurology

## 2021-08-15 ENCOUNTER — Encounter: Payer: Medicaid Other | Admitting: Internal Medicine

## 2021-08-15 ENCOUNTER — Telehealth: Payer: Self-pay | Admitting: Internal Medicine

## 2021-08-15 ENCOUNTER — Other Ambulatory Visit: Payer: Self-pay | Admitting: Internal Medicine

## 2021-08-15 DIAGNOSIS — Z800 Family history of malignant neoplasm of digestive organs: Secondary | ICD-10-CM

## 2021-08-15 NOTE — Telephone Encounter (Signed)
Good Morning Dr. Henrene Pastor  I called patient and she stated she forgot about this appointment,  I rescheduled her for Oct 14, 2021

## 2021-08-25 ENCOUNTER — Telehealth: Payer: Self-pay | Admitting: *Deleted

## 2021-08-25 NOTE — Telephone Encounter (Signed)
[name] (Key: [id number]) Emgality 120MG /ML auto-injectors (migraine)  Sent PA for Emgality in am waiting on approval

## 2021-08-25 NOTE — Telephone Encounter (Signed)
We received a fax stating Emgality has been approved today through 08/25/22. Pt must be seen for further refills, however.

## 2021-08-25 NOTE — Telephone Encounter (Signed)
Spoke to patient. Pt states emgality works well . Pt thanked me for calling her Made pt a appointment . Made patient aware she has to keep her appointment in order for [place] to continue to fill her emaglity . Pt understood

## 2021-08-31 ENCOUNTER — Other Ambulatory Visit: Payer: Self-pay | Admitting: Neurology

## 2021-09-30 ENCOUNTER — Telehealth: Payer: Self-pay | Admitting: *Deleted

## 2021-09-30 NOTE — Telephone Encounter (Signed)
R/S PV FOR 2/24

## 2021-09-30 NOTE — Telephone Encounter (Signed)
Patient no show/no answer PV appointment for today. I called patient, no answer, left a message for the patient to call us back today before 5 pm or the PV and procedure will be cancelled.

## 2021-09-30 NOTE — Telephone Encounter (Signed)
Noted  

## 2021-10-03 ENCOUNTER — Other Ambulatory Visit: Payer: Self-pay | Admitting: Neurology

## 2021-10-04 ENCOUNTER — Other Ambulatory Visit: Payer: Self-pay | Admitting: Nurse Practitioner

## 2021-10-04 ENCOUNTER — Telehealth: Payer: Self-pay

## 2021-10-04 NOTE — Telephone Encounter (Signed)
Attempted to reach pt for PV x3 without success.  LVMM for pt to call back by 5:00 today to r/s PV and prevent cancelling of procedure.

## 2021-10-04 NOTE — Telephone Encounter (Signed)
No call back 5:00 pm to r/s PV.  Procedure was cancelled.

## 2021-10-13 ENCOUNTER — Ambulatory Visit: Payer: Medicaid Other | Admitting: Adult Health

## 2021-10-13 ENCOUNTER — Encounter: Payer: Self-pay | Admitting: Adult Health

## 2021-10-13 ENCOUNTER — Other Ambulatory Visit: Payer: Self-pay

## 2021-10-13 VITALS — BP 120/74 | HR 72 | Ht 60.0 in | Wt 206.0 lb

## 2021-10-13 DIAGNOSIS — G43711 Chronic migraine without aura, intractable, with status migrainosus: Secondary | ICD-10-CM | POA: Diagnosis not present

## 2021-10-13 DIAGNOSIS — R519 Headache, unspecified: Secondary | ICD-10-CM | POA: Diagnosis not present

## 2021-10-13 DIAGNOSIS — G4719 Other hypersomnia: Secondary | ICD-10-CM | POA: Diagnosis not present

## 2021-10-13 MED ORDER — EMGALITY 120 MG/ML ~~LOC~~ SOAJ: 120.0000 mg | SUBCUTANEOUS | 11 refills | Status: DC

## 2021-10-13 MED ORDER — NURTEC 75 MG PO TBDP: 75.0000 mg | ORAL_TABLET | Freq: Every day | ORAL | 6 refills | Status: DC | PRN

## 2021-10-13 NOTE — Progress Notes (Signed)
[facility]    Provider:  Dr Jaynee Eagles Requesting Provider: Vevelyn Francois, NP Primary Care Provider:  Vevelyn Francois, NP   Chief Complaint  Patient presents with   Follow-up    RM 3 with son [name] Pt is well, states migraines have been fine, not as bad as it was       HPI:   Update 10/13/2021 JM: returns for migraine follow up accompanied by her son, [name].  Previously saw Dr. Jaynee Eagles on 05/25/2020 for initial consult visit.  Started on [employer] and [employer].  Reports migraine headaches currently occurring x1 per month (previously daily). Will use Nurtec with great benefit.  Tolerating both well without side effects. Did not complete sleep study as previously ordered. Does continue to have insomnia and daytime fatigue.  No new concerns at this time.      Consult visit 05/25/2020 Dr. Jaynee Eagles:  [name] is a 66 y.o. female here as requested by Vevelyn Francois, NP for migraines.  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 I reviewed epic notes, patient was seen in the emergency room in July of this year for migraine: She had a sudden onset constant diffuse chest pain, the night before she started having migraine headaches, she tried to go to sleep and ignore it however it kept her up around 5 AM she took a Fioricet, when she woke up up later in the morning and she reported the headache had resolved. she also reported other symptoms such as shortness of breath, nausea, emesis, diaphoresis, EMS was called, pain started with a migraine headache but then started having chest pain after taking Fioricet, when seen in the emergency room she was not have a headache and was there for more for the other symptoms.  It appears as though she was also admitted back in January for Covid positive and migraine, with  acute hypoxic respiratory failure, non-intractable migraine, acute kidney injury, chronic adrenal insufficiency on home hydrocortisone, hyperlipidemia, hypothyroidism, depression and previous CVA (or MI really unclear).  Migraines started at the age of 19 after bing it in the head. Here with her son who also provides much information. The older she gets the worse she gets. Headaches start in the thr front of the head, continuous headache. Never had a sleep test. Wakes with dry mouth in the morning. Wakes up with headaches every day. Headaches every day, continuous just waxes and wanes all day, worsening 6 months ago with intractable headaches, no trauma, nothing maes it better or worse. Starts on the right side if a migraines or on the top, pulsating, pounding throbbing, light and sound and smell, significant nausea, vomiting, movement makes it worse, it is positional worse in the morning, can wake her up at night, she is also having vision changes in her right eye movement and changing positions make it worse. 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 Migraines daily as well.  Headaches daily.  Continuous headache.  No medication overuse, denies.  No aura.  Ongoing for years but worsening over the last 6 months with vision changes, nocturnal and morning headaches, positional quality.  She is tried and failed multiple medications including preventatives  in acute.  We discussed her options, feel an MRI of the brain is warranted, would not hesitate to start Botox and we will initiate CGRP. No other focal neurologic deficits, associated symptoms, inciting events or modifiable factors.  Reviewed notes, labs and imaging from outside physicians, which showed:    CT head 09/2019: IMPRESSION: Personally reviewed and agree with the following: 1. No acute intracranial  abnormalities. 2. Significant mucosal thickening throughout the paranasal sinuses as above with no air-fluid levels.   Review of Systems: Patient complains of symptoms per HPI as well as the following symptoms: Headaches, fatigue, snoring, insomnia, migraines, Pertinent negatives and positives per HPI. All others negative.   Social History   Socioeconomic History   Marital status: Married    Spouse name: Not on file   Number of children: 2   Years of education: Not on file   Highest education level: Not on file  Occupational History   Occupation: Disabled  Tobacco Use   Smoking status: Never   Smokeless tobacco: Never  Vaping Use   Vaping Use: Never used  Substance and Sexual Activity   Alcohol use: No   Drug use: No   Sexual activity: Not Currently  Other Topics Concern   Not on file  Social History Narrative   Lives with sister in law right now   Left handed   Caffeine: none    Social Determinants of Health   Financial Resource Strain: Not on file  Food Insecurity: Not on file  Transportation Needs: Not on file  Physical Activity: Not on file  Stress: Not on file  Social Connections: Not on file  Intimate Partner Violence: Not on file    Family History  Problem Relation Age of Onset   Acromegaly Father    Heart disease Father        Tumor in the heart   Stomach cancer Father    Colon cancer Sister 49   Diabetes Sister    Kidney disease Sister        x2   Heart disease Sister        Tumor in the heart   Heart disease Daughter        Tumor in the heart   Colon polyps Neg Hx    Esophageal cancer Neg Hx    Gallbladder disease Neg Hx    Migraines Neg Hx    Rectal cancer Neg Hx     Past Medical History:  Diagnosis Date   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HCC)    Allergy    Amblyopia    Anemia    Arthritis    "in my lower back"   Asthma    Atrial myxoma    Blindness of left eye 1978   Blood transfusion    Breast cyst    Constipation    Cushing's  syndrome (HCC)    CVA (cerebral infarction)    Depression    Depression    GERD (gastroesophageal reflux disease)    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[place])    Hemorrhoids    Hyperlipemia    Hypothyroidism    per office note dated 03/25/2014   Migraine    Peptic ulcer associated with Helicobacter pylori infection    Reflux    Stroke [facility]) 1978   "piece tumor broke off & went to left eye; leaving me blind"    Patient Active Problem List   Diagnosis Date Noted   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   NSVT (nonsustained ventricular tachycardia) 03/08/2021   New-onset  angina ([place]) 12/04/2020   Chronic migraine without aura, with intractable migraine, so stated, with status migrainosus 05/25/2020   Frequent PVCs    COVID-19 10/10/2019   Hypoxia    Migraine with aura and without status migrainosus, not intractable    Tachycardia    ARF (acute renal failure) ([place]) 12/06/2018   Fever 12/06/2018   Acute hypoxemic respiratory failure ([place]) 12/06/2018   Headache 11/02/2013   Dizziness 11/02/2013   Anemia 01/10/2013   Chest pain, unspecified 01/10/2013   Adjustment disorder with mixed anxiety and depressed mood 01/10/2013   Adrenal crisis ([place]) 01/07/2013   Nausea & vomiting 08/03/2011   Hypotension 08/03/2011   Adrenal insufficiency ([place]) 08/03/2011   Hyponatremia 08/03/2011   Hypothyroidism 08/03/2011   Depression 08/03/2011    Past Surgical History:  Procedure Laterality Date   ADRENAL GLAND SURGERY  09/11/1980   on hydrocortisone   ATRIAL MYXOMA EXCISION  09/11/1976   BREAST BIOPSY  07/13/1999   ductal tissue excision & bx right; bx only left breast   COLONOSCOPY     EYE SURGERY     LEFT HEART CATHETERIZATION WITH CORONARY ANGIOGRAM N/A 10/06/2011   Procedure: LEFT HEART CATHETERIZATION WITH CORONARY ANGIOGRAM;  Surgeon: Clent Demark, MD;  Location: [facility];  Service: Cardiovascular;  Laterality: N/A;   ORBITOTOMY Right 07/27/2021   POLYPECTOMY     repaired cross eyed     "as a  child"   ROTATOR CUFF REPAIR  05/12/2005   left   VAGINAL HYSTERECTOMY  09/11/1998   "partial; w/left ovary"    Current Outpatient Medications  Medication Sig Dispense Refill   aspirin EC 81 MG tablet Take 81 mg by mouth daily. Swallow whole. (Patient not taking: Reported on 08/01/2021)     Cobalamin Combinations (NEURIVA PLUS PO) Take 1 capsule by mouth daily.     fenofibrate (TRICOR) 48 MG tablet Take 1 tablet (48 mg total) by mouth daily. 90 tablet 3   flecainide (TAMBOCOR) 50 MG tablet Take 1 tablet (50 mg total) by mouth 2 (two) times daily. 60 tablet 11   Galcanezumab-gnlm (EMGALITY) 120 MG/ML SOAJ Inject 120 mg into the skin every 30 (thirty) days. 1 mL 11   HYDROcodone-acetaminophen (NORCO/VICODIN) 5-325 MG tablet Take 1-2 tablets by mouth every 6 (six) hours as needed for moderate pain or severe pain. 20 tablet 0   hydrocortisone (CORTEF) 20 MG tablet Take 0.5-1 tablets (10-20 mg total) by mouth 2 (two) times daily. Take 1 tablet every morning and 1/2 tablet every evening 45 tablet o   levothyroxine (SYNTHROID) 75 MCG tablet Take 1 tablet (75 mcg total) by mouth daily. 30 tablet 2   Melatonin 10 MG TABS Take 1 tablet by mouth at bedtime. 90 tablet 3   methocarbamol (ROBAXIN) 500 MG tablet Take 1 tablet (500 mg total) by mouth 4 (four) times daily. 30 tablet 0   metoprolol tartrate (LOPRESSOR) 25 MG tablet Take 1 tablet (25 mg total) by mouth 2 (two) times daily. 180 tablet 3   nitroGLYCERIN (NITROSTAT) 0.4 MG SL tablet Place 1 tablet (0.4 mg total) under the tongue every 5 (five) minutes x 3 doses as needed for chest pain. 25 tablet 12   omega-3 acid ethyl esters (LOVAZA) 1 g capsule Take 1 capsule (1 g total) by mouth 2 (two) times daily. 60 capsule 11   omeprazole (PRILOSEC) 20 MG capsule Take 20 mg by mouth 2 (two) times daily.     Rimegepant Sulfate (NURTEC) 75 MG TBDP Take  75 mg by mouth daily as needed. For migraines. Take as close to onset of migraine as possible. One daily  maximum. 8 tablet 6   rosuvastatin (CRESTOR) 10 MG tablet Take 1 tablet (10 mg total) by mouth daily. 30 tablet 11   venlafaxine XR (EFFEXOR-XR) 150 MG 24 hr capsule Take 1 capsule (150 mg total) by mouth daily. 30 capsule 11   No current facility-administered medications for this visit.    Allergies as of 10/13/2021 - Review Complete 10/13/2021  Allergen Reaction Noted   Aspirin Other (See Comments) 10/03/2011   Ibuprofen [ibuprofen] Nausea And Vomiting 08/02/2011   Penicillins Nausea And Vomiting 06/02/2011    Vitals: BP 120/74    Pulse 72    Ht 5' (1.524 m)    Wt 206 lb (93.4 kg)    BMI 40.23 kg/m  Last Weight:  Wt Readings from Last 1 Encounters:  10/13/21 206 lb (93.4 kg)   Last Height:   Ht Readings from Last 1 Encounters:  10/13/21 5' (1.524 m)   Physical exam: Exam: Gen: NAD, very pleasant middle-aged African-American female, conversant, well nourised, obese, well groomed                     CV: RRR, no MRG. No Carotid Bruits. No peripheral edema, warm, nontender   Neuro: Detailed Neurologic Exam  Speech:    Speech is normal; fluent and spontaneous with normal comprehension.  Cognition:    The patient is oriented to person, place, and time;     recent and remote memory intact;     language fluent;     normal attention, concentration,     fund of knowledge Cranial Nerves:    The pupils are equal, round, and reactive to light. Visual fields are full to finger confrontation. Extraocular movements are intact. Trigeminal sensation is intact and the muscles of mastication are normal. The face is symmetric. The palate elevates in the midline. Hearing intact. Voice is normal. Shoulder shrug is normal. The tongue has normal motion without fasciculations.   Coordination:    Normal finger to nose and heel to shin. Normal rapid alternating movements.   Gait:    Heel-toe and tandem gait are normal.   Motor Observation:    No asymmetry, no atrophy, and no involuntary  movements noted. Tone:    Normal muscle tone.    Posture:    Posture is normal. normal erect    Strength:    Strength is V/V in the upper and lower limbs.      Sensation: intact to LT     Reflex Exam:  DTR's:    Deep tendon reflexes in the upper and lower extremities are normal bilaterally.   Toes:    The toes are downgoing bilaterally.   Clonus:    Clonus is absent.    Assessment/Plan:  66 year old with chronic migraines followed by Dr. Jaynee Eagles.  Initially referred due to worsening migraine headaches, vision changes in right eye with swelling and positional headaches.  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    -Headaches greatly improved on Emgality - will continue -refill provided -Continue Nurtec as needed for emergent management.  Triptans contraindicated due to history of stroke and CAD -Referral placed to [facility] for sleep study evaluation  and concern of underlying sleep apnea -Continue to follow with ophthalmology as advised    Discussed: To prevent or relieve headaches, try the following: Cool Compress. Lie down and place a cool compress on your head.  Avoid headache triggers. If certain foods or odors seem to have triggered your migraines in the past, avoid them. A headache diary might help you identify triggers.  Include physical activity in your daily routine. Try a daily walk or other moderate aerobic exercise.  Manage stress. Find healthy ways to cope with the stressors, such as delegating tasks on your to-do list.  Practice relaxation techniques. Try deep breathing, yoga, massage and visualization.  Eat regularly. Eating regularly scheduled meals and maintaining a healthy diet might help prevent headaches. Also, drink  plenty of fluids.  Follow a regular sleep schedule. Sleep deprivation might contribute to headaches Consider biofeedback. With this mind-body technique, you learn to control certain bodily functions -- such as muscle tension, heart rate and blood pressure -- to prevent headaches or reduce headache pain.    Proceed to emergency room if you experience new or worsening symptoms or symptoms do not resolve, if you have new neurologic symptoms or if headache is severe, or for any concerning symptom.   Provided education and documentation from American headache Society toolbox including articles on: chronic migraine medication overuse headache, chronic migraines, prevention of migraines, behavioral and other nonpharmacologic treatments for headache.    Orders Placed This Encounter  Procedures   Ambulatory referral to Sleep Studies   Meds ordered this encounter  Medications   Rimegepant Sulfate (NURTEC) 75 MG TBDP    Sig: Take 75 mg by mouth daily as needed. For migraines. Take as close to onset of migraine as possible. One daily maximum.    Dispense:  8 tablet    Refill:  6    8 or max allowed by insurance. Triptans contraindicated in this patient due to stroke   Galcanezumab-gnlm (EMGALITY) 120 MG/ML SOAJ    Sig: Inject 120 mg into the skin every 30 (thirty) days.    Dispense:  1 mL    Refill:  11    Cc: King, Diona Foley, NP,    I spent 26 minutes of face-to-face and non-face-to-face time with patient and son.  This included previsit chart review, lab review, study review, order entry, electronic health record documentation, patient education and discussion regarding chronic migraines and continued treatment options, possible underlying sleep apnea and further evaluation and answered all other questions to patients and sons satisfaction   Frann Rider, AGNP-BC  [facility] [address] [place], [place] [address]  Phone [phone] Fax  [phone] Note: This document was prepared with digital dictation and possible smart phrase technology. Any transcriptional errors that result from this process are unintentional.

## 2021-10-13 NOTE — Patient Instructions (Addendum)
Your Plan:  Continue Emgality injection every 30 days  Continue Nurtec 75mg  PRN (max dose 1 tab per day) for migraines  You will be called to schedule an evaluation for possible sleep apnea    Follow up in 1 year or call earlier if needed     Thank you for coming to see [place] at [facility]. I hope we have been able to provide you high quality care today.  You may receive a patient satisfaction survey over the next few weeks. We would appreciate your feedback and comments so that we may continue to improve ourselves and the health of our patients.

## 2021-10-14 ENCOUNTER — Encounter: Payer: Medicaid Other | Admitting: Internal Medicine

## 2021-11-08 ENCOUNTER — Telehealth: Payer: Self-pay | Admitting: *Deleted

## 2021-11-08 NOTE — Telephone Encounter (Signed)
Nurtec Approved, Coverage Starts on: 11/08/2021 12:00:00 AM, Coverage Ends on: 11/08/2022 12:00:00 AM

## 2021-11-08 NOTE — Telephone Encounter (Signed)
Nurtec PA, key [id number] ,[phone]. Your information has been sent to [place] [employer].

## 2021-11-29 ENCOUNTER — Other Ambulatory Visit: Payer: Self-pay | Admitting: Nurse Practitioner

## 2021-11-29 DIAGNOSIS — E781 Pure hyperglyceridemia: Secondary | ICD-10-CM

## 2021-12-01 ENCOUNTER — Other Ambulatory Visit: Payer: Self-pay | Admitting: Nurse Practitioner

## 2021-12-01 DIAGNOSIS — F4323 Adjustment disorder with mixed anxiety and depressed mood: Secondary | ICD-10-CM

## 2021-12-06 ENCOUNTER — Other Ambulatory Visit: Payer: Self-pay | Admitting: Neurology

## 2021-12-06 DIAGNOSIS — G43711 Chronic migraine without aura, intractable, with status migrainosus: Secondary | ICD-10-CM

## 2021-12-20 ENCOUNTER — Encounter: Payer: Self-pay | Admitting: Neurology

## 2021-12-20 ENCOUNTER — Institutional Professional Consult (permissible substitution): Payer: Medicaid Other | Admitting: Neurology

## 2021-12-23 ENCOUNTER — Observation Stay (HOSPITAL_COMMUNITY)
Admission: EM | Admit: 2021-12-23 | Discharge: 2021-12-25 | Disposition: A | Discharge status: Home / Self Care | Payer: Medicaid Other | Attending: Internal Medicine | Admitting: Internal Medicine

## 2021-12-23 DIAGNOSIS — R000 Tachycardia, unspecified: Secondary | ICD-10-CM | POA: Insufficient documentation

## 2021-12-23 DIAGNOSIS — E871 Hypo-osmolality and hyponatremia: Secondary | ICD-10-CM | POA: Diagnosis not present

## 2021-12-23 DIAGNOSIS — N2889 Other specified disorders of kidney and ureter: Secondary | ICD-10-CM | POA: Diagnosis not present

## 2021-12-23 DIAGNOSIS — Z20822 Contact with and (suspected) exposure to covid-19: Secondary | ICD-10-CM | POA: Diagnosis not present

## 2021-12-23 DIAGNOSIS — R5383 Other fatigue: Secondary | ICD-10-CM | POA: Diagnosis not present

## 2021-12-23 DIAGNOSIS — N179 Acute kidney failure, unspecified: Secondary | ICD-10-CM | POA: Diagnosis not present

## 2021-12-23 DIAGNOSIS — R079 Chest pain, unspecified: Secondary | ICD-10-CM | POA: Diagnosis not present

## 2021-12-23 DIAGNOSIS — E039 Hypothyroidism, unspecified: Secondary | ICD-10-CM | POA: Insufficient documentation

## 2021-12-23 DIAGNOSIS — Z8616 Personal history of COVID-19: Secondary | ICD-10-CM | POA: Insufficient documentation

## 2021-12-23 DIAGNOSIS — Z90710 Acquired absence of both cervix and uterus: Secondary | ICD-10-CM | POA: Diagnosis not present

## 2021-12-23 DIAGNOSIS — K5900 Constipation, unspecified: Secondary | ICD-10-CM | POA: Insufficient documentation

## 2021-12-23 DIAGNOSIS — I10 Essential (primary) hypertension: Secondary | ICD-10-CM | POA: Diagnosis not present

## 2021-12-23 DIAGNOSIS — Z8673 Personal history of transient ischemic attack (TIA), and cerebral infarction without residual deficits: Secondary | ICD-10-CM | POA: Diagnosis not present

## 2021-12-23 DIAGNOSIS — R7989 Other specified abnormal findings of blood chemistry: Secondary | ICD-10-CM | POA: Diagnosis not present

## 2021-12-23 DIAGNOSIS — Z7982 Long term (current) use of aspirin: Secondary | ICD-10-CM | POA: Diagnosis not present

## 2021-12-23 DIAGNOSIS — N289 Disorder of kidney and ureter, unspecified: Secondary | ICD-10-CM | POA: Insufficient documentation

## 2021-12-23 DIAGNOSIS — Z79899 Other long term (current) drug therapy: Secondary | ICD-10-CM | POA: Insufficient documentation

## 2021-12-23 DIAGNOSIS — J984 Other disorders of lung: Secondary | ICD-10-CM | POA: Diagnosis not present

## 2021-12-23 DIAGNOSIS — E896 Postprocedural adrenocortical (-medullary) hypofunction: Principal | ICD-10-CM | POA: Insufficient documentation

## 2021-12-23 DIAGNOSIS — R0789 Other chest pain: Secondary | ICD-10-CM | POA: Diagnosis not present

## 2021-12-23 DIAGNOSIS — R112 Nausea with vomiting, unspecified: Secondary | ICD-10-CM

## 2021-12-23 DIAGNOSIS — K5730 Diverticulosis of large intestine without perforation or abscess without bleeding: Secondary | ICD-10-CM | POA: Diagnosis not present

## 2021-12-23 DIAGNOSIS — J45909 Unspecified asthma, uncomplicated: Secondary | ICD-10-CM | POA: Insufficient documentation

## 2021-12-23 NOTE — ED Triage Notes (Addendum)
Pt arrived via [facility] for cc of chest pain, lethargy, weakness. Pt reports to EMS chest pain began this morning at 9am from "nipple line to knees." Family report pt has been slow to answer today, but per EMS pt answers all questions appropriately, but notably lethargic. Pt tachycardic in route. ? ?EMS Vitals ? ?BP 144/76 ?HR 120 - frequent PVCs ?RR 16-20 ?SPO2 96% RA ?CBG 103 ? ?At time of arrival pt reporting shortness of breath, weakness, headache, chest discomfort, dizziness, nausea and vomiting x1 week. Pt reported that the increase in chest pain made her call EMS tonight. A&Ox4, GCS 15. ?

## 2021-12-24 ENCOUNTER — Emergency Department (HOSPITAL_COMMUNITY): Payer: Medicaid Other

## 2021-12-24 ENCOUNTER — Encounter (HOSPITAL_COMMUNITY): Payer: Self-pay | Admitting: Internal Medicine

## 2021-12-24 ENCOUNTER — Other Ambulatory Visit: Payer: Self-pay

## 2021-12-24 DIAGNOSIS — N2889 Other specified disorders of kidney and ureter: Secondary | ICD-10-CM | POA: Diagnosis not present

## 2021-12-24 DIAGNOSIS — Z90710 Acquired absence of both cervix and uterus: Secondary | ICD-10-CM | POA: Diagnosis not present

## 2021-12-24 DIAGNOSIS — R7989 Other specified abnormal findings of blood chemistry: Secondary | ICD-10-CM | POA: Diagnosis not present

## 2021-12-24 DIAGNOSIS — K5730 Diverticulosis of large intestine without perforation or abscess without bleeding: Secondary | ICD-10-CM | POA: Diagnosis not present

## 2021-12-24 DIAGNOSIS — J984 Other disorders of lung: Secondary | ICD-10-CM | POA: Diagnosis not present

## 2021-12-24 DIAGNOSIS — N179 Acute kidney failure, unspecified: Secondary | ICD-10-CM | POA: Diagnosis not present

## 2021-12-24 DIAGNOSIS — R079 Chest pain, unspecified: Secondary | ICD-10-CM | POA: Diagnosis not present

## 2021-12-24 DIAGNOSIS — R112 Nausea with vomiting, unspecified: Secondary | ICD-10-CM | POA: Diagnosis not present

## 2021-12-24 LAB — URINALYSIS, ROUTINE W REFLEX MICROSCOPIC
Bacteria, UA: NONE SEEN
Bilirubin Urine: NEGATIVE
Glucose, UA: NEGATIVE mg/dL
Hgb urine dipstick: NEGATIVE
Ketones, ur: 5 mg/dL — AB
Nitrite: NEGATIVE
Protein, ur: NEGATIVE mg/dL
Specific Gravity, Urine: 1.03 (ref 1.005–1.030)
pH: 5 (ref 5.0–8.0)

## 2021-12-24 LAB — CBC WITH DIFFERENTIAL/PLATELET
Abs Immature Granulocytes: 0.01 10*3/uL (ref 0.00–0.07)
Basophils Absolute: 0 10*3/uL (ref 0.0–0.1)
Basophils Relative: 1 %
Eosinophils Absolute: 0.5 10*3/uL (ref 0.0–0.5)
Eosinophils Relative: 7 %
HCT: 47.3 % — ABNORMAL HIGH (ref 36.0–46.0)
Hemoglobin: 15.9 g/dL — ABNORMAL HIGH (ref 12.0–15.0)
Immature Granulocytes: 0 %
Lymphocytes Relative: 49 %
Lymphs Abs: 3.5 10*3/uL (ref 0.7–4.0)
MCH: 30 pg (ref 26.0–34.0)
MCHC: 33.6 g/dL (ref 30.0–36.0)
MCV: 89.2 fL (ref 80.0–100.0)
Monocytes Absolute: 1 10*3/uL (ref 0.1–1.0)
Monocytes Relative: 14 %
Neutro Abs: 2 10*3/uL (ref 1.7–7.7)
Neutrophils Relative %: 29 %
Platelets: 323 10*3/uL (ref 150–400)
RBC: 5.3 MIL/uL — ABNORMAL HIGH (ref 3.87–5.11)
RDW: 13.3 % (ref 11.5–15.5)
WBC: 7.1 10*3/uL (ref 4.0–10.5)
nRBC: 0 % (ref 0.0–0.2)

## 2021-12-24 LAB — COMPREHENSIVE METABOLIC PANEL
ALT: 21 U/L (ref 0–44)
AST: 40 U/L (ref 15–41)
Albumin: 3.9 g/dL (ref 3.5–5.0)
Alkaline Phosphatase: 59 U/L (ref 38–126)
Anion gap: 14 (ref 5–15)
BUN: 23 mg/dL (ref 8–23)
CO2: 18 mmol/L — ABNORMAL LOW (ref 22–32)
Calcium: 10.1 mg/dL (ref 8.9–10.3)
Chloride: 100 mmol/L (ref 98–111)
Creatinine, Ser: 1.13 mg/dL — ABNORMAL HIGH (ref 0.44–1.00)
GFR, Estimated: 55 mL/min — ABNORMAL LOW (ref >60)
Glucose, Bld: 105 mg/dL — ABNORMAL HIGH (ref 70–99)
Potassium: 4.3 mmol/L (ref 3.5–5.1)
Sodium: 132 mmol/L — ABNORMAL LOW (ref 135–145)
Total Bilirubin: 1.5 mg/dL — ABNORMAL HIGH (ref 0.3–1.2)
Total Protein: 7.7 g/dL (ref 6.5–8.1)

## 2021-12-24 LAB — TSH: TSH: 0.831 u[IU]/mL (ref 0.350–4.500)

## 2021-12-24 LAB — LIPASE, BLOOD: Lipase: 23 U/L (ref 11–51)

## 2021-12-24 LAB — PROTIME-INR
INR: 1.2 (ref 0.8–1.2)
Prothrombin Time: 15.2 seconds (ref 11.4–15.2)

## 2021-12-24 LAB — I-STAT VENOUS BLOOD GAS, ED
Acid-Base Excess: 2 mmol/L (ref 0.0–2.0)
Bicarbonate: 26.4 mmol/L (ref 20.0–28.0)
Calcium, Ion: 1.12 mmol/L — ABNORMAL LOW (ref 1.15–1.40)
HCT: 48 % — ABNORMAL HIGH (ref 36.0–46.0)
Hemoglobin: 16.3 g/dL — ABNORMAL HIGH (ref 12.0–15.0)
O2 Saturation: 99 %
Potassium: 4.3 mmol/L (ref 3.5–5.1)
Sodium: 132 mmol/L — ABNORMAL LOW (ref 135–145)
TCO2: 28 mmol/L (ref 22–32)
pCO2, Ven: 38.4 mmHg — ABNORMAL LOW (ref 44–60)
pH, Ven: 7.445 — ABNORMAL HIGH (ref 7.25–7.43)
pO2, Ven: 150 mmHg — ABNORMAL HIGH (ref 32–45)

## 2021-12-24 LAB — I-STAT CHEM 8, ED
BUN: 24 mg/dL — ABNORMAL HIGH (ref 8–23)
Calcium, Ion: 1.14 mmol/L — ABNORMAL LOW (ref 1.15–1.40)
Chloride: 99 mmol/L (ref 98–111)
Creatinine, Ser: 1 mg/dL (ref 0.44–1.00)
Glucose, Bld: 108 mg/dL — ABNORMAL HIGH (ref 70–99)
HCT: 50 % — ABNORMAL HIGH (ref 36.0–46.0)
Hemoglobin: 17 g/dL — ABNORMAL HIGH (ref 12.0–15.0)
Potassium: 4.3 mmol/L (ref 3.5–5.1)
Sodium: 132 mmol/L — ABNORMAL LOW (ref 135–145)
TCO2: 22 mmol/L (ref 22–32)

## 2021-12-24 LAB — CORTISOL-AM, BLOOD: Cortisol - AM: <0.4 ug/dL — ABNORMAL LOW (ref 6.7–22.6)

## 2021-12-24 LAB — RESP PANEL BY RT-PCR (FLU A&B, COVID) ARPGX2
Influenza A by PCR: NEGATIVE
Influenza B by PCR: NEGATIVE
SARS Coronavirus 2 by RT PCR: NEGATIVE

## 2021-12-24 LAB — LACTIC ACID, PLASMA
Lactic Acid, Venous: 1.6 mmol/L (ref 0.5–1.9)
Lactic Acid, Venous: 1.3 mmol/L (ref 0.5–1.9)

## 2021-12-24 LAB — APTT: aPTT: 34 seconds (ref 24–36)

## 2021-12-24 LAB — TROPONIN I (HIGH SENSITIVITY)
Troponin I (High Sensitivity): 10 ng/L (ref <18)
Troponin I (High Sensitivity): 10 ng/L (ref <18)

## 2021-12-24 LAB — MAGNESIUM: Magnesium: 1.3 mg/dL — ABNORMAL LOW (ref 1.7–2.4)

## 2021-12-24 LAB — PHOSPHORUS: Phosphorus: 2.8 mg/dL (ref 2.5–4.6)

## 2021-12-24 LAB — HIV ANTIBODY (ROUTINE TESTING W REFLEX): HIV Screen 4th Generation wRfx: NONREACTIVE

## 2021-12-24 LAB — D-DIMER, QUANTITATIVE: D-Dimer, Quant: 0.92 ug/mL-FEU — ABNORMAL HIGH (ref 0.00–0.50)

## 2021-12-24 MED ORDER — ALUM & MAG HYDROXIDE-SIMETH 200-200-20 MG/5ML PO SUSP
30.0000 mL | Freq: Once | ORAL | Status: AC
  Administered 2021-12-24: 30 mL via ORAL
  Filled 2021-12-24: qty 30

## 2021-12-24 MED ORDER — FLECAINIDE ACETATE 50 MG PO TABS
50.0000 mg | ORAL_TABLET | Freq: Two times a day (BID) | ORAL | Status: DC
Start: 2021-12-24 — End: 2021-12-25
  Administered 2021-12-24 – 2021-12-25 (×3): 50 mg via ORAL
  Filled 2021-12-24 (×4): qty 1

## 2021-12-24 MED ORDER — SODIUM CHLORIDE 0.9 % IV BOLUS (SEPSIS)
1000.0000 mL | Freq: Once | INTRAVENOUS | Status: AC
  Administered 2021-12-24: 1000 mL via INTRAVENOUS

## 2021-12-24 MED ORDER — PANTOPRAZOLE SODIUM 40 MG IV SOLR
40.0000 mg | Freq: Two times a day (BID) | INTRAVENOUS | Status: DC
  Administered 2021-12-24 – 2021-12-25 (×3): 40 mg via INTRAVENOUS
  Filled 2021-12-24 (×3): qty 10

## 2021-12-24 MED ORDER — LEVOTHYROXINE SODIUM 75 MCG PO TABS
75.0000 ug | ORAL_TABLET | Freq: Every day | ORAL | Status: DC
  Administered 2021-12-24 – 2021-12-25 (×2): 75 ug via ORAL
  Filled 2021-12-24 (×2): qty 1

## 2021-12-24 MED ORDER — ACETAMINOPHEN 650 MG RE SUPP: 650.0000 mg | Freq: Four times a day (QID) | RECTAL | Status: DC | PRN

## 2021-12-24 MED ORDER — PANTOPRAZOLE SODIUM 40 MG PO TBEC
40.0000 mg | DELAYED_RELEASE_TABLET | Freq: Every day | ORAL | Status: DC
  Administered 2021-12-24: 40 mg via ORAL
  Filled 2021-12-24: qty 1

## 2021-12-24 MED ORDER — MAGNESIUM SULFATE 2 GM/50ML IV SOLN
2.0000 g | Freq: Once | INTRAVENOUS | Status: AC
  Administered 2021-12-24: 2 g via INTRAVENOUS
  Filled 2021-12-24: qty 50

## 2021-12-24 MED ORDER — SODIUM CHLORIDE 0.9 % IV BOLUS
1000.0000 mL | Freq: Once | INTRAVENOUS | Status: AC
  Administered 2021-12-24: 1000 mL via INTRAVENOUS

## 2021-12-24 MED ORDER — HYDROCORTISONE SOD SUC (PF) 100 MG IJ SOLR
100.0000 mg | Freq: Two times a day (BID) | INTRAMUSCULAR | Status: DC
  Administered 2021-12-24 – 2021-12-25 (×2): 100 mg via INTRAVENOUS
  Filled 2021-12-24 (×2): qty 2

## 2021-12-24 MED ORDER — HYDROCORTISONE SOD SUC (PF) 100 MG IJ SOLR
100.0000 mg | Freq: Once | INTRAMUSCULAR | Status: AC
  Administered 2021-12-24: 100 mg via INTRAVENOUS
  Filled 2021-12-24: qty 2

## 2021-12-24 MED ORDER — IOHEXOL 350 MG/ML SOLN
100.0000 mL | Freq: Once | INTRAVENOUS | Status: AC | PRN
  Administered 2021-12-24: 100 mL via INTRAVENOUS

## 2021-12-24 MED ORDER — LIDOCAINE VISCOUS HCL 2 % MT SOLN
15.0000 mL | Freq: Once | OROMUCOSAL | Status: AC
  Administered 2021-12-24: 15 mL via ORAL
  Filled 2021-12-24: qty 15

## 2021-12-24 MED ORDER — ACETAMINOPHEN 325 MG PO TABS
650.0000 mg | ORAL_TABLET | Freq: Once | ORAL | Status: AC
  Administered 2021-12-24: 650 mg via ORAL
  Filled 2021-12-24: qty 2

## 2021-12-24 MED ORDER — ENSURE ENLIVE PO LIQD: 237.0000 mL | Freq: Two times a day (BID) | ORAL | Status: DC

## 2021-12-24 MED ORDER — ENOXAPARIN SODIUM 40 MG/0.4ML IJ SOSY
40.0000 mg | PREFILLED_SYRINGE | INTRAMUSCULAR | Status: DC
  Administered 2021-12-24 – 2021-12-25 (×2): 40 mg via SUBCUTANEOUS
  Filled 2021-12-24 (×2): qty 0.4

## 2021-12-24 MED ORDER — FENOFIBRATE 54 MG PO TABS
54.0000 mg | ORAL_TABLET | Freq: Every day | ORAL | Status: DC
  Administered 2021-12-24 – 2021-12-25 (×2): 54 mg via ORAL
  Filled 2021-12-24 (×2): qty 1

## 2021-12-24 MED ORDER — ACETAMINOPHEN 325 MG PO TABS: 650.0000 mg | ORAL_TABLET | Freq: Four times a day (QID) | ORAL | Status: DC | PRN

## 2021-12-24 MED ORDER — ROSUVASTATIN CALCIUM 5 MG PO TABS
10.0000 mg | ORAL_TABLET | Freq: Every day | ORAL | Status: DC
Start: 2021-12-24 — End: 2021-12-25
  Administered 2021-12-24 – 2021-12-25 (×2): 10 mg via ORAL
  Filled 2021-12-24 (×2): qty 2

## 2021-12-24 MED ORDER — ASPIRIN EC 81 MG PO TBEC
81.0000 mg | DELAYED_RELEASE_TABLET | Freq: Every day | ORAL | Status: DC
  Administered 2021-12-24 – 2021-12-25 (×2): 81 mg via ORAL
  Filled 2021-12-24 (×2): qty 1

## 2021-12-24 MED ORDER — ONDANSETRON HCL 4 MG/2ML IJ SOLN
4.0000 mg | Freq: Four times a day (QID) | INTRAMUSCULAR | Status: DC | PRN
Start: 2021-12-24 — End: 2021-12-25

## 2021-12-24 MED ORDER — HYDROCORTISONE SOD SUC (PF) 100 MG IJ SOLR: 50.0000 mg | Freq: Four times a day (QID) | INTRAMUSCULAR | Status: DC

## 2021-12-24 MED ORDER — ONDANSETRON HCL 4 MG PO TABS: 4.0000 mg | ORAL_TABLET | Freq: Four times a day (QID) | ORAL | Status: DC | PRN

## 2021-12-24 MED ORDER — SODIUM CHLORIDE 0.9 % IV SOLN
1000.0000 mL | INTRAVENOUS | Status: DC
  Administered 2021-12-24: 1000 mL via INTRAVENOUS

## 2021-12-24 MED ORDER — VENLAFAXINE HCL ER 75 MG PO CP24
150.0000 mg | ORAL_CAPSULE | Freq: Every day | ORAL | Status: DC
Start: 2021-12-24 — End: 2021-12-25
  Administered 2021-12-24 – 2021-12-25 (×2): 150 mg via ORAL
  Filled 2021-12-24: qty 2
  Filled 2021-12-24: qty 1

## 2021-12-24 NOTE — ED Notes (Signed)
EDP finished at [facility]. Pt alert, NAD, calm, interactive, family at [facility]. C/o abd and chest pain. IVF infusing. VSS. HR 120.  ?

## 2021-12-24 NOTE — ED Notes (Signed)
Sleeping, arousable to voice, given meds, VSS.  ?

## 2021-12-24 NOTE — Progress Notes (Signed)
?   12/24/21 1618  ?Assess: MEWS Score  ?Temp 97.9 ?F (36.6 ?C)  ?BP 98/77  ?Pulse Rate (!) 113  ?ECG Heart Rate (!) 113  ?Resp 18  ?Level of Consciousness Alert  ?SpO2 96 %  ?O2 Device Room Air  ?Assess: MEWS Score  ?MEWS Temp 0  ?MEWS Systolic 1  ?MEWS Pulse 2  ?MEWS RR 0  ?MEWS LOC 0  ?MEWS Score 3  ?MEWS Score Color Yellow  ?Assess: if the MEWS score is Yellow or Red  ?Were vital signs taken at a resting state? No  ?Focused Assessment No change from prior assessment  ?Early Detection of Sepsis Score *See Row Information* Low  ?MEWS guidelines implemented *See Row Information* Yes  ?Treat  ?MEWS Interventions Escalated (See documentation below)  ?Take Vital Signs  ?Increase Vital Sign Frequency  Yellow: Q 2hr X 2 then Q 4hr X 2, if remains yellow, continue Q 4hrs  ?Escalate  ?MEWS: Escalate Yellow: discuss with charge nurse/RN and consider discussing with provider and RRT  ?Notify: Charge Nurse/RN  ?Name of Charge Nurse/RN Notified Devin, RN  ?Date Charge Nurse/RN Notified 12/24/21  ?Time Charge Nurse/RN Notified 1837  ?Document  ?Patient Outcome Other (Comment) ?(Patient stable and MD aware. No changes at this time.)  ?Progress note created (see row info) Yes  ? ? ?

## 2021-12-24 NOTE — H&P (Addendum)
? ?NAME:  [name], MRN:  [id number], DOB:  1956-01-07, LOS: 0 ?ADMISSION DATE:  12/23/2021, Primary: Pcp, No  ?CHIEF COMPLAINT:   nausea/vomiting ? ?Medical Service: Internal Medicine Teaching Service    ?     ?Attending Physician: Dr. Aldine Contes, MD    ?First Contact: Dr. Vinetta Bergamo Pager: [phone]  ?Second Contact: Dr. Lisabeth Devoid Pager: [phone]  ?     ?After Hours (After 5p/  First Contact Pager: [phone]  ?weekends / holidays): Second Contact Pager: [phone]  ? ? ?History of present illness   ?[name] is a 66 year old female with aquired post surgical adrenal insufficiency and hypothyroidism.  ? ?She presented to the ED via EMS with 1w of persistent nausea, vomiting, fatigue, dizziness, and generalized abdominal pain.  ? ?Vomit is non-bloody. It is worse at night however also occurs about four times daily. Last bowel movement was 2 weeks ago. Continues to pass flatulence. No diarrhea. Abdominal pain is constant and diffuse but worse in the epigastric region. No aggrevating or alleviating factors.  ? ?She reports some chest discomfort that started a few days ago. Constant but worse with movement and deep inspiration. ? ?Denies fevers. Endorses chills. Denies headache, neck or facial pain, cough, shortness of breath, myalgias. ?Denies recent medication changes. Denies recent illness. No sick contacts.  ? ?She reports that she had bilateral adrenalectomies 20+ years ago. Documentation from cardiology notes that this was due to acromegaly. She has been on hydrocortisone. Chart review indicates that it is prescribed as $RemoveBefor'20mg'gWdqGWBbXJZV$  in the morning and 10 mg at night. She reports that she had switched to only taking $RemoveBef'20mg'pjBaqRhdpp$  once in the morning due to weight gain about 5 years ago. She previously followed with an endocrinologist however was supposedly told that he didn't want to see her anymore.  ? 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 More recently, she had, what is documented as being, a benign schwannoma, resected under her right eye in November 2022.  ? 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  ? ?Home Medications    ? ?Prior to Admission medications   ?Medication Sig Start Date End Date Taking? Authorizing Provider  ?aspirin EC 81 MG tablet Take 81 mg by mouth daily. Swallow whole.   Yes [provider]  ?EMGALITY 120 MG/ML SOAJ INJECT 120 MG into THE SKIN EVERY 30 DAYS ?Patient taking differently: Inject 120 mg into the skin every 30 (thirty) days. 12/07/21  Yes Frann Rider, NP  ?fenofibrate (TRICOR) 48 MG tablet Take 1 tablet (48 mg total) by mouth daily. 05/20/21  Yes Evans Lance, MD  ?flecainide (TAMBOCOR) 50 MG tablet Take 1 tablet (50 mg total) by mouth 2 (two) times daily. 05/20/21  Yes Evans Lance, MD  ?hydrocortisone (CORTEF) 20 MG tablet Take 0.5-1 tablets (10-20 mg total) by mouth 2 (two) times daily. Take 1 tablet every morning and 1/2 tablet every evening ?Patient taking differently: Take 10-20 mg by mouth See admin instructions. 20 mg in the morning ?10 mg in the evening 7/61/60  Yes Delora Fuel, MD  ?  levothyroxine (SYNTHROID) 75 MCG tablet Take 1 tablet (75 mcg total) by mouth daily. 11/30/21 11/30/22 Yes Vevelyn Francois, NP  ?methocarbamol (ROBAXIN) 500 MG tablet Take 1 tablet (500 mg total) by mouth 4 (four) times daily. ?Patient taking differently: Take 500 mg by mouth every 8 (eight) hours as needed for muscle  spasms. 04/30/21  Yes Charlesetta Shanks, MD  ?metoprolol tartrate (LOPRESSOR) 25 MG tablet Take 1 tablet (25 mg total) by mouth 2 (two) times daily. 05/20/21 12/24/21 Yes Evans Lance, MD  ?nitroGLYCERIN (NITROSTAT) 0.4 MG SL tablet Place 1 tablet (0.4 mg total) under the tongue every 5 (five) minutes x 3 doses as needed for chest pain. 12/05/20  Yes Charolette Forward, MD  ?omega-3 acid ethyl esters (LOVAZA) 1 g capsule Take 1 capsule (1 g total) by mouth 2 (two) times daily. 11/30/21 11/30/22 Yes Vevelyn Francois, NP  ?omeprazole (PRILOSEC) 20 MG capsule Take 20 mg by mouth 2 (two) times daily. 11/15/20  Yes [provider]  ?Rimegepant Sulfate (NURTEC) 75 MG TBDP Take 75 mg by mouth daily as needed. For migraines. Take as close to onset of migraine as possible. One daily maximum. 10/13/21  Yes Frann Rider, NP  ?rosuvastatin (CRESTOR) 10 MG tablet Take 1 tablet (10 mg total) by mouth daily. 12/02/21 12/02/22 Yes Vevelyn Francois, NP  ?venlafaxine XR (EFFEXOR-XR) 150 MG 24 hr capsule Take 1 capsule (150 mg total) by mouth daily. 12/02/21 12/02/22 Yes Vevelyn Francois, NP  ? ? ?Allergies   ? ?Allergies as of 12/23/2021 - Review Complete 10/13/2021  ?Allergen Reaction Noted  ? Aspirin Other (See Comments) 10/03/2011  ? Ibuprofen [ibuprofen] Nausea And Vomiting 08/02/2011  ? Penicillins Nausea And Vomiting 06/02/2011  ? ? ?Social History  ? reports that she has never smoked. She has never used smokeless tobacco. She reports that she does not drink alcohol and does not use drugs.  ? ?Family History   ?Her family history includes Acromegaly in her father; Colon cancer (age of onset: 14) in her sister; Diabetes in her sister; Heart disease in her daughter, father, and sister; Kidney disease in her sister; Stomach cancer in her father. There is no history of Colon polyps, Esophageal cancer, Gallbladder disease, Migraines, or Rectal cancer.  ? ?Objective   ?Blood pressure 105/76, pulse (!) 116, temperature 99.3 ?F (37.4 ?C),  temperature source Oral, resp. rate 19, height 5' (1.524 m), weight 95 kg, SpO2 99 %. ?   ?General: acutely ill appearing but in no acute distress ?HEENT: dry MM ?Cardiac: telemetry with occassional PVCs. Tachycardic rate, regular rhythm. No LE edema ?Pulm: breathing comfortably on room air. Lungs clear throughout ?GI: hypoactive bowel sounds. Diffuse abdominal tenderness on palpation.  ?Neuro: a/o x4. Converses normally. Moves all extremities.  ?Skin: warm and dry ?MSK: tenderness to palpation over the anterior chest wall. No crepitus ?Significant Diagnostic Tests:  ? ? ?  Latest Ref Rng & Units 12/24/2021  ?  1:12 AM 12/24/2021  ? 12:59 AM 02/19/2021  ?  2:20 PM  ?CBC  ?WBC 4.0 - 10.5 K/uL  7.1   6.3    ?Hemoglobin 12.0 - 15.0 g/dL 17.0    ? 16.3   15.9   13.4    ?Hematocrit 36.0 - 46.0 % 50.0    ? 48.0   47.3   41.6    ?Platelets 150 - 400 K/uL  323   375    ? ? ?  Latest Ref Rng & Units 12/24/2021  ?  1:12 AM 12/24/2021  ? 12:59 AM 05/30/2021  ? 10:56 AM  ?BMP  ?Glucose 70 - 99 mg/dL 108   105   123    ?BUN 8 - 23 mg/dL $Remove'24   23   22    'WSFcwTj$ ?Creatinine 0.44 - 1.00 mg/dL 1.00   1.13   1.31    ?BUN/Creat Ratio 12 - 28   17    ?Sodium 135 - 145 mmol/L 132    ? 132   132   139    ?Potassium 3.5 - 5.1 mmol/L 4.3    ? 4.3   4.3   4.6    ?Chloride 98 - 111 mmol/L 99   100   100    ?CO2 22 - 32 mmol/L  18     ?Calcium 8.9 - 10.3 mg/dL  10.1   10.2    ? ? ?  Latest Ref Rng & Units 12/24/2021  ? 12:59 AM 05/30/2021  ? 10:56 AM 11/25/2020  ?  1:50 PM  ?Hepatic Function  ?Total Protein 6.5 - 8.1 g/dL 7.7   7.3   7.3    ?Albumin 3.5 - 5.0 g/dL 3.9   4.6   4.4    ?AST 15 - 41 U/L 40   19   25    ?ALT 0 - 44 U/L 21      ?Alk Phosphatase 38 - 126 U/L 59   76   70    ?Total Bilirubin 0.3 - 1.2 mg/dL 1.5   0.3   0.3    ? ?Lactic acid 1.6 ?D-dimer 0.92 ?Troponin 10>>10 ?VBG: pH 7.445, PCO2 38.4, PO2 150, bicarb 26 ?COVID, influenza a and B: Negative ?UA: Specific gravity 1.03, small ketones, large leukocytes, negative nitrates, 0-5 RBC per hpf,  11-20 WBC per hpf, no bacteria seen ? ?CT Angio Chest PE W and/or Wo Contrast ?Result Date: 12/24/2021 ?IMPRESSION: No evidence of pulmonary embolism. No pathologic mediastinal lymphadenopathy or hilar lymph nod

## 2021-12-24 NOTE — ED Provider Notes (Signed)
?[place] ?Provider Note ? ?CSN: [id number] ?Arrival date & time: 12/23/21 2343 ? ?Chief Complaint(s) ?Fatigue and Chest Pain ? ?HPI ?[name] is a 66 y.o. female with a past medical history listed below including adrenal insufficiency, hyperlipidemia, prior CVA who presents to the emergency department with 1 week of nausea and nonbloody nonbilious emesis.  Reports she throws up 3-4 times a day.  Still able to tolerate home medication but has had decreased fluid in oral hydration.  She is endorsing generalized abdominal discomfort as well as lower chest discomfort.  She reports generalized fatigue and dizziness.  No diarrhea.  Reports that she has not had a bowel movement in 1 week.  No fevers.  She endorses chills.  No known sick contacts.  No urinary symptoms. ? ?The history is provided by the patient.  ? ?Past Medical History ?Past Medical History:  ?Diagnosis Date  ? 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[place])   ? Allergy   ? Amblyopia   ? Anemia   ? Arthritis   ? "in my lower back"  ? Asthma   ? Atrial myxoma   ? Blindness of left eye 1978  ? Blood transfusion   ? Breast cyst   ? Constipation   ? 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 CVA (cerebral infarction)   ? Depression   ? Depression   ? GERD (gastroesophageal reflux disease)   ? 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 Hemorrhoids   ? Hyperlipemia   ? Hypothyroidism   ? per office note dated 03/25/2014  ? Migraine   ? Peptic ulcer associated with Helicobacter pylori infection   ? Reflux   ? Stroke [facility]) 1978  ? 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 ?Patient Active Problem List  ? Diagnosis Date Noted  ? 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 NSVT (nonsustained ventricular tachycardia) ([place]) 03/08/2021  ? New-onset angina ([place]) 12/04/2020  ? Chronic migraine without aura, with intractable migraine, so stated, with status migrainosus 05/25/2020  ? Frequent PVCs   ? COVID-19 10/10/2019  ? Hypoxia   ? Migraine with aura and  without status migrainosus, not intractable   ? Tachycardia   ? ARF (acute renal failure) ([place]) 12/06/2018  ? Fever 12/06/2018  ? Acute hypoxemic respiratory failure ([place]) 12/06/2018  ? Headache 11/02/2013  ? Dizziness 11/02/2013  ? Anemia 01/10/2013  ? Chest pain, unspecified 01/10/2013  ? Adjustment disorder with mixed anxiety and depressed mood 01/10/2013  ? Adrenal crisis ([place]) 01/07/2013  ? Nausea & vomiting 08/03/2011  ? Hypotension 08/03/2011  ? Adrenal insufficiency ([place]) 08/03/2011  ? Hyponatremia 08/03/2011  ? Hypothyroidism 08/03/2011  ? Depression 08/03/2011  ? ?Home Medication(s) ?Prior to Admission medications   ?Medication Sig Start Date End Date Taking? Authorizing Provider  ?aspirin EC 81 MG tablet Take 81 mg by mouth daily. Swallow whole.   Yes [provider]  ?EMGALITY 120 MG/ML SOAJ INJECT 120 MG into THE SKIN EVERY 30 DAYS ?Patient taking differently: Inject 120 mg into the skin every 30 (thirty) days. 12/07/21  Yes Frann Rider, NP  ?fenofibrate (TRICOR) 48 MG tablet Take 1 tablet (48 mg total) by mouth daily. 05/20/21  Yes Evans Lance, MD  ?flecainide (TAMBOCOR) 50 MG tablet Take 1 tablet (50 mg total) by mouth 2 (two) times daily. 05/20/21  Yes Evans Lance, MD  ?hydrocortisone (CORTEF) 20 MG tablet Take 0.5-1 tablets (10-20 mg total) by mouth 2 (two) times daily. Take  1 tablet every morning and 1/2 tablet every evening ?Patient taking differently: Take 10-20 mg by mouth See admin instructions. 20 mg in the morning ?10 mg in the evening 2/95/18  Yes Delora Fuel, MD  ?levothyroxine (SYNTHROID) 75 MCG tablet Take 1 tablet (75 mcg total) by mouth daily. 11/30/21 11/30/22 Yes Vevelyn Francois, NP  ?methocarbamol (ROBAXIN) 500 MG tablet Take 1 tablet (500 mg total) by mouth 4 (four) times daily. ?Patient taking differently: Take 500 mg by mouth every 8 (eight) hours as needed for muscle spasms. 04/30/21  Yes Charlesetta Shanks, MD  ?metoprolol tartrate (LOPRESSOR) 25 MG tablet Take 1  tablet (25 mg total) by mouth 2 (two) times daily. 05/20/21 12/24/21 Yes Evans Lance, MD  ?nitroGLYCERIN (NITROSTAT) 0.4 MG SL tablet Place 1 tablet (0.4 mg total) under the tongue every 5 (five) minutes x 3 doses as needed for chest pain. 12/05/20  Yes Charolette Forward, MD  ?omega-3 acid ethyl esters (LOVAZA) 1 g capsule Take 1 capsule (1 g total) by mouth 2 (two) times daily. 11/30/21 11/30/22 Yes Vevelyn Francois, NP  ?omeprazole (PRILOSEC) 20 MG capsule Take 20 mg by mouth 2 (two) times daily. 11/15/20  Yes [provider]  ?Rimegepant Sulfate (NURTEC) 75 MG TBDP Take 75 mg by mouth daily as needed. For migraines. Take as close to onset of migraine as possible. One daily maximum. 10/13/21  Yes Frann Rider, NP  ?rosuvastatin (CRESTOR) 10 MG tablet Take 1 tablet (10 mg total) by mouth daily. 12/02/21 12/02/22 Yes Vevelyn Francois, NP  ?venlafaxine XR (EFFEXOR-XR) 150 MG 24 hr capsule Take 1 capsule (150 mg total) by mouth daily. 12/02/21 12/02/22 Yes Vevelyn Francois, NP  ?HYDROcodone-acetaminophen (NORCO/VICODIN) 5-325 MG tablet Take 1-2 tablets by mouth every 6 (six) hours as needed for moderate pain or severe pain. ?Patient not taking: Reported on 12/24/2021 04/30/21   Charlesetta Shanks, MD  ?                                                                                                                                  ?Allergies ?Aspirin, Ibuprofen [ibuprofen], and Penicillins ? ?Review of Systems ?Review of Systems ?As noted in HPI ? ?Physical Exam ?Vital Signs  ?I have reviewed the triage vital signs ?BP 101/69   Pulse (!) 118   Temp 99.3 ?F (37.4 ?C) (Oral)   Resp 19   Ht 5' (1.524 m)   Wt 95 kg   SpO2 98%   BMI 40.90 kg/m?  ? ?Physical Exam ?Vitals reviewed.  ?Constitutional:   ?   General: She is not in acute distress. ?   Appearance: She is well-developed. She is not diaphoretic.  ?HENT:  ?   Head: Normocephalic and atraumatic.  ?   Nose: Nose normal.  ?Eyes:  ?   General: No scleral icterus.    ?    Right eye: No discharge.     ?  Left eye: No discharge.  ?   Conjunctiva/sclera: Conjunctivae normal.  ?Cardiovascular:  ?   Rate and Rhythm: Regular rhythm. Tachycardia present.  ?   Heart sounds: No murmur heard. ?  No friction rub. No gallop.  ?Pulmonary:  ?   Effort: Pulmonary effort is normal. No respiratory distress.  ?   Breath sounds: Normal breath sounds. No stridor. No rales.  ?Abdominal:  ?   General: There is no distension.  ?   Palpations: Abdomen is soft.  ?   Tenderness: There is generalized abdominal tenderness (discomfort).  ?Musculoskeletal:     ?   General: No tenderness.  ?   Cervical back: Normal range of motion and neck supple.  ?Skin: ?   General: Skin is warm and dry.  ?   Findings: No erythema or rash.  ?Neurological:  ?   Mental Status: She is alert and oriented to person, place, and time.  ? ? ?ED Results and Treatments ?Labs ?(all labs ordered are listed, but only abnormal results are displayed) ?Labs Reviewed  ?COMPREHENSIVE METABOLIC PANEL - Abnormal; Notable for the following components:  ?    Result Value  ? Sodium 132 (*)   ? CO2 18 (*)   ? Glucose, Bld 105 (*)   ? Creatinine, Ser 1.13 (*)   ? Total Bilirubin 1.5 (*)   ? GFR, Estimated 55 (*)   ? All other components within normal limits  ?CBC WITH DIFFERENTIAL/PLATELET - Abnormal; Notable for the following components:  ? RBC 5.30 (*)   ? Hemoglobin 15.9 (*)   ? HCT 47.3 (*)   ? All other components within normal limits  ?D-DIMER, QUANTITATIVE - Abnormal; Notable for the following components:  ? D-Dimer, Quant 0.92 (*)   ? All other components within normal limits  ?I-STAT CHEM 8, ED - Abnormal; Notable for the following components:  ? Sodium 132 (*)   ? BUN 24 (*)   ? Glucose, Bld 108 (*)   ? Calcium, Ion 1.14 (*)   ? Hemoglobin 17.0 (*)   ? HCT 50.0 (*)   ? All other components within normal limits  ?I-STAT VENOUS BLOOD GAS, ED - Abnormal; Notable for the following components:  ? pH, Ven 7.445 (*)   ? pCO2, Ven 38.4 (*)   ? pO2,  Ven 150 (*)   ? Sodium 132 (*)   ? Calcium, Ion 1.12 (*)   ? HCT 48.0 (*)   ? Hemoglobin 16.3 (*)   ? All other components within normal limits  ?RESP PANEL BY RT-PCR (FLU A&B, COVID) ARPGX2  ?LACTIC ACID, PLASMA  ?PRO

## 2021-12-24 NOTE — Progress Notes (Signed)
Pt needs a BSC at home ?

## 2021-12-25 DIAGNOSIS — N179 Acute kidney failure, unspecified: Secondary | ICD-10-CM | POA: Diagnosis not present

## 2021-12-25 LAB — BASIC METABOLIC PANEL
Anion gap: 11 (ref 5–15)
BUN: 12 mg/dL (ref 8–23)
CO2: 20 mmol/L — ABNORMAL LOW (ref 22–32)
Calcium: 9.3 mg/dL (ref 8.9–10.3)
Chloride: 103 mmol/L (ref 98–111)
Creatinine, Ser: 1.03 mg/dL — ABNORMAL HIGH (ref 0.44–1.00)
GFR, Estimated: >60 mL/min (ref >60)
Glucose, Bld: 160 mg/dL — ABNORMAL HIGH (ref 70–99)
Potassium: 4.5 mmol/L (ref 3.5–5.1)
Sodium: 134 mmol/L — ABNORMAL LOW (ref 135–145)

## 2021-12-25 LAB — CBC
HCT: 38 % (ref 36.0–46.0)
Hemoglobin: 12.5 g/dL (ref 12.0–15.0)
MCH: 29.5 pg (ref 26.0–34.0)
MCHC: 32.9 g/dL (ref 30.0–36.0)
MCV: 89.6 fL (ref 80.0–100.0)
Platelets: 237 10*3/uL (ref 150–400)
RBC: 4.24 MIL/uL (ref 3.87–5.11)
RDW: 13.6 % (ref 11.5–15.5)
WBC: 6.8 10*3/uL (ref 4.0–10.5)
nRBC: 0 % (ref 0.0–0.2)

## 2021-12-25 NOTE — Discharge Instructions (Signed)
Dear [name] your admitted to [facility] for adrenal insufficiency.  We believe your nausea and vomiting are likely due to to not having enough cortisol.  It is important that you continue taking your Cortef 20 mg in the morning and 10 mg in the evening.  We would advise you follow-up with your PCP in 1 week.  If you are unable to schedule an appointment in 1 week we will be happy to schedule you for a 1 week follow-up at the [place] which is located on the ground for [facility].  Please call to make an appointment at [phone]. You also had a CT of your abdomen during her hospitalization and noticed some changes in your kidneys that we will need further imaging with MRI to further characterize this. ?

## 2021-12-25 NOTE — Progress Notes (Signed)
Internal Medicine Attending:  ? ?I saw and examined the patient. I reviewed the resident?s H&P note and I agree with the resident?s findings and plan as documented in the resident?s note. ? ?In brief, patient is 66 year old female with past medical history of adrenal insufficiency secondary to bilateral adrenalectomy, left eye blindness, asthma, CVA, depression, hyperlipidemia, hypothyroidism and Carney syndrome who presented to the ED with nausea, vomiting and decreased oral intake over the last week. ? ?Patient states that over the last week she has noted persistent nausea and vomiting and decreased oral intake.  Patient also complains of associated constipation but does continue to pass flatulence.  Patient also has associated abdominal pain which is constant and diffuse but worse in the epigastric region.  Today, patient states that her nausea and vomiting has resolved and she is tolerating oral intake well. ? ?On exam, patient is sitting in recliner with no apparent distress.  Lungs are clear to auscultation bilaterally.  Cardiovascular exam reveals regular rate and rhythm with normal heart sounds.  Abdomen is soft, nontender, nondistended with normoactive bowel sounds.  Extremities with no lower extremity edema and are nontender to palpation.  Patient with normal mood and affect. ? ?Patient presented to the ED with persistent nausea and vomiting over the last week with associated abdominal pain which has since resolved.  Initially there was concern for possible bowel obstruction given her constipation and persistent nausea and vomiting but her CT was negative for for bowel obstruction as well as any evidence of infection or inflammation of her GI tract.  Patient also has no fevers or leukocytosis indicative of an underlying infection.  It is possible that her symptoms are secondary to a viral gastroenteritis but her symptoms are more likely secondary to adrenal insufficiency.  Patient has not been compliant  with her prescribed dose of steroids and adrenal insufficiency can cause nausea vomiting and abdominal pain.  Patient symptoms appear to have resolved since starting stress dose steroids.  Patient counseled on taking her steroids appropriately. Will DC patient on prescribed home dose of her steroids.  No further work-up at this time ? ?Of note, patient's hyponatremia as well as creatinine have improved with IV fluids indicating hypovolemia as the likely etiology of these lab abnormalities.  No further work-up at this time ?

## 2021-12-25 NOTE — Progress Notes (Signed)
[name] to be D/C'd home per MD order. Discussed with the patient and all questions fully answered. Patient states she is missing her glasses. Room fully searched and ED contacted to see if glasses were left there but they could not be located. ?Skin clean, dry and intact without evidence of skin break down, no evidence of skin tears noted.  ?IV catheters discontinued intact. Site without signs and symptoms of complications. Dressing and pressure applied.  ?An After Visit Summary was printed and given to the patient.  ?Patient escorted via [facility], and D/C home via private auto.  ?Melonie [place]  ?12/25/2021 3:27 PM ?  ?   ?

## 2021-12-25 NOTE — Care Management (Signed)
?  Transition of Care (TOC) Screening Note ? ? ?Patient Details  ?Name: [name] ?Date of Birth: Mar 14, 1956 ? ? ?Transition of Care (TOC) CM/SW Contact:    ?Carles Collet, RN ?Phone Number: ?12/25/2021, 10:49 AM ? ? ? ?Transition of Care Department [facility]) has reviewed patient and no TOC needs have been identified at this time.  ?Patient admitted from home w family for one night stay.  ?Coverage Medicaid, follows at [facility], with frequent visits at [facility] and [facility] ?

## 2021-12-25 NOTE — Discharge Summary (Signed)
? ?Name: [name] ?MRN: [id number] ?DOB: Dec 02, 1955 66 y.o. ?PCP: Pcp, No ? ?Date of Admission: 12/23/2021 11:43 PM ?Date of Discharge: No discharge date for patient encounter. ?Attending Physician: Aldine Contes, MD ? ?Discharge Diagnosis: ?1.  Adrenal insufficiency s/p bilateral adrenalectomy ?2. Nausea and vomiting ?3. AKI likely prerenal in the setting of adrenal sufficiency and nausea vomiting ? ?Discharge Medications: ?Allergies as of 12/25/2021   ? ?   Reactions  ? Aspirin Other (See Comments)  ? Can take baby aspirin (heart flutters with high doses)  ? Ibuprofen [ibuprofen] Nausea And Vomiting  ? Penicillins Nausea And Vomiting  ? Has patient had a PCN reaction causing immediate rash, facial/tongue/throat swelling, SOB or lightheadedness with hypotension: Yes ?Has patient had a PCN reaction causing severe rash involving mucus membranes or skin necrosis: No ?Has patient had a PCN reaction that required hospitalization No ?Has patient had a PCN reaction occurring within the last 10 years: No ?If all of the above answers are "NO", then may proceed with Cephalosporin use.  ? ?  ? ?  ?Medication List  ?  ? ?TAKE these medications   ? ?aspirin EC 81 MG tablet ?Take 81 mg by mouth daily. Swallow whole. ?  ?Emgality 120 MG/ML Soaj ?Generic drug: Galcanezumab-gnlm ?INJECT 120 MG into THE SKIN EVERY 30 DAYS ?What changed: See the new instructions. ?  ?fenofibrate 48 MG tablet ?Commonly known as: TRICOR ?Take 1 tablet (48 mg total) by mouth daily. ?  ?flecainide 50 MG tablet ?Commonly known as: TAMBOCOR ?Take 1 tablet (50 mg total) by mouth 2 (two) times daily. ?  ?hydrocortisone 20 MG tablet ?Commonly known as: CORTEF ?Take 0.5-1 tablets (10-20 mg total) by mouth 2 (two) times daily. Take 1 tablet every morning and 1/2 tablet every evening ?What changed:  ?when to take this ?additional instructions ?  ?levothyroxine 75 MCG tablet ?Commonly known as: SYNTHROID ?Take 1 tablet (75 mcg total) by mouth daily. ?   ?methocarbamol 500 MG tablet ?Commonly known as: Robaxin ?Take 1 tablet (500 mg total) by mouth 4 (four) times daily. ?What changed:  ?when to take this ?reasons to take this ?  ?metoprolol tartrate 25 MG tablet ?Commonly known as: LOPRESSOR ?Take 1 tablet (25 mg total) by mouth 2 (two) times daily. ?  ?nitroGLYCERIN 0.4 MG SL tablet ?Commonly known as: NITROSTAT ?Place 1 tablet (0.4 mg total) under the tongue every 5 (five) minutes x 3 doses as needed for chest pain. ?  ?Nurtec 75 MG Tbdp ?Generic drug: Rimegepant Sulfate ?Take 75 mg by mouth daily as needed. For migraines. Take as close to onset of migraine as possible. One daily maximum. ?  ?omega-3 acid ethyl esters 1 g capsule ?Commonly known as: LOVAZA ?Take 1 capsule (1 g total) by mouth 2 (two) times daily. ?  ?omeprazole 20 MG capsule ?Commonly known as: PRILOSEC ?Take 20 mg by mouth 2 (two) times daily. ?  ?rosuvastatin 10 MG tablet ?Commonly known as: CRESTOR ?Take 1 tablet (10 mg total) by mouth daily. ?  ?venlafaxine XR 150 MG 24 hr capsule ?Commonly known as: EFFEXOR-XR ?Take 1 capsule (150 mg total) by mouth daily. ?  ? ?  ? ? ?Disposition and follow-up:   ?[name] was discharged from [facility] in Stable condition.  At the hospital follow up visit please address: ? ?1.  Adrenal insufficiency: Please ensure patient is taking her Cortef '20mg'$  in the morning '10mg'$  in the afternoon.  Please monitor for worsening  signs of adrenal insufficiency and need to adjust medications.  May need further endocrinology follow-up as she is no longer following with prior endocrinologist. ? ?Renal lesions: Noted to have multiple left kidney lesions incidentally on CT abdomen.  May need MRI follow-up to further characterize ? ?2.  Labs / imaging needed at time of follow-up: BMP, MRI abdomen ? ?3.  Pending labs/ test needing follow-up: none ? ?Follow-up Appointments: ? Follow-up Information   ? ? [facility] Follow up  in 1 week(s).   ?Why: As needed if you are unable to schedule a 1 week follow up with your PCP in 1 week ?Contact information: ?[address] ?[place] ?[phone] ? ?  ?  ? ?  ?  ? ?  ? ? ?Hospital Course by problem list: ?Adrenal insufficiency ?Presents 2 weeks of nausea vomiting and abdominal pain.  She has a history of adrenal insufficiency secondary to bilateral adrenalectomy for acromegaly.  Reports decreased oral intake secondary to nausea and vomiting.  Initially had some complaints of constipation with concern for bowel obstruction.  CT negative for bowel obstruction.  Patient was noted to be afebrile with no leukocytosis or other signs of underlying infection.  She may of had viral gastroenteritis enteritis that precipitated her symptoms although more likely her symptoms are secondary to adrenal insufficiency.  She has been only taking 20 mg of her Cortef in the mornings and not taking her afternoon dose of 10 mg due to reported weight gain 5 years ago.  Sodium noted to be 132 on admission.  Blood pressures low normal during this admission.  This improved to after 2 L of fluids in the ED.  Patient improved with stress dose steroids of hydrocortisone 100 mg twice daily. Her nausea, vomiting, and abdominal pain resolved.  Eating and drinking well on day of discharge.  We discussed the importance of medication compliance.  Patient agreeable to taking her Cortef and mornings and afternoons rather than just her morning dose.  She believes she has a upcoming appointment with her PCP in 1 week although she is not completely sure.  She has not given [facility] contact information in case she cannot schedule close hospital follow-up in 1 week.  May also need to reestablish endocrinology after discharge. ? ?AKI ?Patient presented with BUN of 23 and serum creatinine of 1.1 on admission.  Also noted to be hyponatremic to 132.  She received IV fluids in the ED.  Likely in the setting of poor p.o.  intake and nausea and vomiting due to adrenal insufficiency.  Creatinine improved to 1 and sodium improved to 134 on day of discharge. ? ?Renal lesions ?Patient noted to have multiple less than 2 cm incidental left kidney lesions on CT abdomen.  Recommended MRI follow-up to further characterize this as an outpatient. ? ?Subjective ?Patient reports feeling well this morning.  No further episodes of nausea or vomiting.  She believes she has follow-up with PCP a week from now is unsure about this.  She understands importance of taking her hydrocortisone and agreeable to taking meds in the morning and evening. ? ?Discharge Exam:   ?BP 109/69 (BP Location: Left Wrist)   Pulse (!) 105   Temp 97.9 ?F (36.6 ?C) (Oral)   Resp 20   Ht 5' (1.524 m)   Wt 93.2 kg   SpO2 95%   BMI 40.13 kg/m?  ?Discharge exam:  ?Constitutional: Appears well-developed and well-nourished. No distress.  ?HENT: Normocephalic  and atraumatic ?Cardiovascular: Heart rate around 100 no murmurs, no edema ?Respiratory: On room air breathing comfortably, no wheezing or rales.   ?GI: Nondistended, soft, nontender to palpation, normal active bowel sounds ?Musculoskeletal: Normal bulk and tone.  No peripheral edema noted. ?Neurological: Is alert and oriented x4, no apparent focal deficits noted. ?Skin: Warm and dry.  Normal skin turgor ?Psychiatric: Normal mood and affect.  ? ? ?Pertinent Labs, Studies, and Procedures:  ? ?  Latest Ref Rng & Units 12/25/2021  ?  1:19 AM 12/24/2021  ?  1:12 AM 12/24/2021  ? 12:59 AM  ?CBC  ?WBC 4.0 - 10.5 K/uL 6.8    7.1    ?Hemoglobin 12.0 - 15.0 g/dL 12.5   17.0    ? 16.3   15.9    ?Hematocrit 36.0 - 46.0 % 38.0   50.0    ? 48.0   47.3    ?Platelets 150 - 400 K/uL 237    323    ? ? ?  Latest Ref Rng & Units 12/25/2021  ?  1:19 AM 12/24/2021  ?  1:12 AM 12/24/2021  ? 12:59 AM  ?CMP  ?Glucose 70 - 99 mg/dL 160   108   105    ?BUN 8 - 23 mg/dL '12   24   23    '$ ?Creatinine 0.44 - 1.00 mg/dL 1.03   1.00   1.13    ?Sodium 135 - 145  mmol/L 134   132    ? 132   132    ?Potassium 3.5 - 5.1 mmol/L 4.5   4.3    ? 4.3   4.3    ?Chloride 98 - 111 mmol/L 103   99   100    ?CO2 22 - 32 mmol/L 20    18    ?Calcium 8.9 - 10.3 mg/dL 9.3    10.1

## 2021-12-26 ENCOUNTER — Telehealth: Payer: Self-pay

## 2021-12-26 NOTE — Telephone Encounter (Signed)
Transition Care Management Unsuccessful Follow-up Telephone Call ? ?Date of discharge and from where:  12/25/2021-[place]  ? ?Attempts:  1st Attempt ? ?Reason for unsuccessful TCM follow-up call:  Left voice message ? ?  ?

## 2021-12-27 NOTE — Telephone Encounter (Signed)
Transition Care Management Unsuccessful Follow-up Telephone Call ? ?Date of discharge and from where:  12/25/2021-[place]  ? ?Attempts:  2nd Attempt ? ?Reason for unsuccessful TCM follow-up call:  Left voice message ? ?  ?

## 2021-12-28 ENCOUNTER — Other Ambulatory Visit: Payer: Self-pay | Admitting: Nurse Practitioner

## 2021-12-28 NOTE — Telephone Encounter (Signed)
Transition Care Management Unsuccessful Follow-up Telephone Call ? ?Date of discharge and from where:  12/25/2021-[place]  ? ?Attempts:  3rd Attempt ? ?Reason for unsuccessful TCM follow-up call:  Left voice message ? ?  ?

## 2022-01-04 ENCOUNTER — Ambulatory Visit (INDEPENDENT_AMBULATORY_CARE_PROVIDER_SITE_OTHER): Payer: Medicaid Other | Admitting: Nurse Practitioner

## 2022-01-04 ENCOUNTER — Encounter: Payer: Self-pay | Admitting: Nurse Practitioner

## 2022-01-04 VITALS — BP 130/70 | HR 62 | Temp 98.4°F | Ht 60.0 in | Wt 215.5 lb

## 2022-01-04 DIAGNOSIS — R0683 Snoring: Secondary | ICD-10-CM

## 2022-01-04 DIAGNOSIS — Z0000 Encounter for general adult medical examination without abnormal findings: Secondary | ICD-10-CM | POA: Diagnosis not present

## 2022-01-04 DIAGNOSIS — Z6841 Body Mass Index (BMI) 40.0 and over, adult: Secondary | ICD-10-CM | POA: Diagnosis not present

## 2022-01-04 DIAGNOSIS — R5383 Other fatigue: Secondary | ICD-10-CM

## 2022-01-04 DIAGNOSIS — G479 Sleep disorder, unspecified: Secondary | ICD-10-CM | POA: Diagnosis not present

## 2022-01-04 MED ORDER — HYDROXYZINE HCL 10 MG PO TABS: 10.0000 mg | ORAL_TABLET | Freq: Every evening | ORAL | 0 refills | Status: DC | PRN

## 2022-01-04 NOTE — Progress Notes (Signed)
$'@Patient'c$  ID: [name], female    DOB: [birth year], 66 y.o.   MRN: [id number] ? ?Chief Complaint  ?Patient presents with  ? Follow-up  ?  Pt stated she she has no energy need medication to sleep. Pt is concern about about eight gain  ? ? ?Referring provider: ?Vevelyn Francois, NP ? ? ?HPI ? ?Patient presents today for routine follow-up.  She states that she has been having more often low energy while going.  She states that she has not been sleeping well.  Patient is a heavy snorer.  She has never had a sleep study.  We discussed that she may need a referral for sleep medicine/sleep study.  Patient is obese and would like to lose weight.  We discussed that we can refer her to medical weight management.  We will check blood work today. Denies f/c/s, n/v/d, hemoptysis, PND, chest pain or edema. ? ? ?Sleep issues ? ?Gets awake and can't go back to sleep ? ?Goes to bed at 9 wakes at 9am ? ?Several nights get dusturbed and can;t get back to sleep ? ?Snores at night  ? ?Naps a lot during the day ? ?Allergies  ?Allergen Reactions  ? Aspirin Other (See Comments)  ?  Can take baby aspirin (heart flutters with high doses)  ? Ibuprofen [Ibuprofen] Nausea And Vomiting  ? Penicillins Nausea And Vomiting  ?  Has patient had a PCN reaction causing immediate rash, facial/tongue/throat swelling, SOB or lightheadedness with hypotension: Yes ?Has patient had a PCN reaction causing severe rash involving mucus membranes or skin necrosis: No ?Has patient had a PCN reaction that required hospitalization No ?Has patient had a PCN reaction occurring within the last 10 years: No ?If all of the above answers are "NO", then may proceed with Cephalosporin use. ?  ? ? ?Immunization History  ?Administered Date(s) Administered  ? Influenza Split 08/03/2011, 07/18/2015  ? Influenza,inj,Quad PF,6+ Mos 07/02/2016, 09/27/2018, 05/30/2021  ? Tdap 09/27/2018  ? ? ?Past Medical History:  ?Diagnosis Date  ? 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[place])   ? Allergy   ?  Amblyopia   ? Anemia   ? Arthritis   ? "in my lower back"  ? Asthma   ? Atrial myxoma   ? Blindness of left eye 1978  ? Blood transfusion   ? Breast cyst   ? Constipation   ? 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 CVA (cerebral infarction)   ? Depression   ? Depression   ? GERD (gastroesophageal reflux disease)   ? 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 Hemorrhoids   ? History of open heart surgery 1977  ? Hyperlipemia   ? Hypothyroidism   ? per office note dated 03/25/2014  ? Migraine   ? Peptic ulcer associated with Helicobacter pylori infection   ? Reflux   ? Stroke [facility]) 1978  ? 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 ? ?Tobacco History: ?Social History  ? ?Tobacco Use  ?Smoking Status Never  ?Smokeless Tobacco Never  ? ?Counseling given: Not Answered ? ? ?Outpatient Encounter Medications as of 01/04/2022  ?Medication Sig  ? aspirin EC 81 MG tablet Take 81 mg by mouth daily. Swallow whole.  ? EMGALITY 120 MG/ML SOAJ INJECT 120 MG into THE SKIN EVERY 30 DAYS (Patient taking differently: Inject 120 mg into the skin every 30 (thirty) days.)  ? fenofibrate (TRICOR) 48 MG tablet Take 1 tablet (48 mg total) by mouth daily.  ?  flecainide (TAMBOCOR) 50 MG tablet Take 1 tablet (50 mg total) by mouth 2 (two) times daily.  ? hydrocortisone (CORTEF) 20 MG tablet Take 0.5-1 tablets (10-20 mg total) by mouth 2 (two) times daily. Take 1 tablet every morning and 1/2 tablet every evening (Patient taking differently: Take 10-20 mg by mouth See admin instructions. 20 mg in the morning ?10 mg in the evening)  ? hydrOXYzine (ATARAX) 10 MG tablet Take 1 tablet (10 mg total) by mouth at bedtime as needed.  ? levothyroxine (SYNTHROID) 75 MCG tablet Take 1 tablet (75 mcg total) by mouth daily.  ? methocarbamol (ROBAXIN) 500 MG tablet Take 1 tablet (500 mg total) by mouth 4 (four) times daily. (Patient taking differently: Take 500 mg by mouth every 8 (eight) hours as needed for muscle spasms.)  ? nitroGLYCERIN (NITROSTAT) 0.4  MG SL tablet Place 1 tablet (0.4 mg total) under the tongue every 5 (five) minutes x 3 doses as needed for chest pain.  ? omega-3 acid ethyl esters (LOVAZA) 1 g capsule Take 1 capsule (1 g total) by mouth 2 (two) times daily.  ? omeprazole (PRILOSEC) 20 MG capsule Take 20 mg by mouth 2 (two) times daily.  ? Rimegepant Sulfate (NURTEC) 75 MG TBDP Take 75 mg by mouth daily as needed. For migraines. Take as close to onset of migraine as possible. One daily maximum.  ? rosuvastatin (CRESTOR) 10 MG tablet Take 1 tablet (10 mg total) by mouth daily.  ? venlafaxine XR (EFFEXOR-XR) 150 MG 24 hr capsule Take 1 capsule (150 mg total) by mouth daily.  ? metoprolol tartrate (LOPRESSOR) 25 MG tablet Take 1 tablet (25 mg total) by mouth 2 (two) times daily.  ? ?No facility-administered encounter medications on file as of 01/04/2022.  ? ? ? ?Review of Systems ? ?Review of Systems  ?Constitutional:  Positive for fatigue.  ?HENT: Negative.    ?Cardiovascular: Negative.   ?Gastrointestinal: Negative.   ?Allergic/Immunologic: Negative.   ?Neurological: Negative.   ?Psychiatric/Behavioral: Negative.     ? ? ? ?Physical Exam ? ?BP 130/70 (BP Location: Right Arm, Patient Position: Sitting, Cuff Size: Normal)   Pulse 62   Temp 98.4 ?F (36.9 ?C)   Ht 5' (1.524 m)   Wt 215 lb 8 oz (97.8 kg)   SpO2 98%   BMI 42.09 kg/m?  ? ?Wt Readings from Last 5 Encounters:  ?01/04/22 215 lb 8 oz (97.8 kg)  ?12/24/21 205 lb 7.5 oz (93.2 kg)  ?10/13/21 206 lb (93.4 kg)  ?08/01/21 201 lb (91.2 kg)  ?07/26/21 203 lb 3.2 oz (92.2 kg)  ? ? ? ?Physical Exam ?Vitals and nursing note reviewed.  ?Constitutional:   ?   General: She is not in acute distress. ?   Appearance: She is well-developed.  ?Cardiovascular:  ?   Rate and Rhythm: Normal rate and regular rhythm.  ?Pulmonary:  ?   Effort: Pulmonary effort is normal.  ?   Breath sounds: Normal breath sounds.  ?Neurological:  ?   Mental Status: She is alert and oriented to person, place, and time.  ? ? ? ?Lab  Results: ? ?CBC ?   ?Component Value Date/Time  ? WBC 6.8 12/25/2021 0119  ? RBC 4.24 12/25/2021 0119  ? HGB 12.5 12/25/2021 0119  ? HGB 14.9 11/25/2020 1350  ? HCT 38.0 12/25/2021 0119  ? HCT 44.8 11/25/2020 1350  ? PLT 237 12/25/2021 0119  ? PLT 258 11/25/2020 1350  ? MCV 89.6 12/25/2021 0119  ? MCV  90 11/25/2020 1350  ? MCH 29.5 12/25/2021 0119  ? MCHC 32.9 12/25/2021 0119  ? RDW 13.6 12/25/2021 0119  ? RDW 13.3 11/25/2020 1350  ? LYMPHSABS 3.5 12/24/2021 0059  ? LYMPHSABS 2.7 11/25/2020 1350  ? MONOABS 1.0 12/24/2021 0059  ? EOSABS 0.5 12/24/2021 0059  ? EOSABS 0.4 11/25/2020 1350  ? BASOSABS 0.0 12/24/2021 0059  ? BASOSABS 0.0 11/25/2020 1350  ? ? ?BMET ?   ?Component Value Date/Time  ? NA 134 (L) 12/25/2021 0119  ? NA 139 05/30/2021 1056  ? K 4.5 12/25/2021 0119  ? CL 103 12/25/2021 0119  ? CO2 20 (L) 12/25/2021 0119  ? GLUCOSE 160 (H) 12/25/2021 0119  ? BUN 12 12/25/2021 0119  ? BUN 22 05/30/2021 1056  ? CREATININE 1.03 (H) 12/25/2021 0119  ? CALCIUM 9.3 12/25/2021 0119  ? GFRNONAA >60 12/25/2021 0119  ? GFRAA 83 05/25/2020 1234  ? ? ?BNP ?No results found for: BNP ? ?ProBNP ?No results found for: PROBNP ? ?Imaging: ?CT Angio Chest PE W and/or Wo Contrast ? ?Result Date: 12/24/2021 ?CLINICAL DATA:  66 year old female with history of nausea and vomiting. Suspected bowel obstruction. Positive D-dimer. Chest pain. Evaluate for pulmonary embolism. EXAM: CT ANGIOGRAPHY CHEST CT ABDOMEN AND PELVIS WITH CONTRAST TECHNIQUE: Multidetector CT imaging of the chest was performed using the standard protocol during bolus administration of intravenous contrast. Multiplanar CT image reconstructions and MIPs were obtained to evaluate the vascular anatomy. Multidetector CT imaging of the abdomen and pelvis was performed using the standard protocol during bolus administration of intravenous contrast. RADIATION DOSE REDUCTION: This exam was performed according to the departmental dose-optimization program which includes automated  exposure control, adjustment of the mA and/or kV according to patient size and/or use of iterative reconstruction technique. CONTRAST:  155m OMNIPAQUE IOHEXOL 350 MG/ML SOLN COMPARISON:  Cardiac CT 06/23/2

## 2022-01-04 NOTE — Patient Instructions (Addendum)
1. Snoring ? ?- Ambulatory referral to Pulmonology ? ?2. Sleep disturbance ? ?- Ambulatory referral to Pulmonology ?- hydrOXYzine (ATARAX) 10 MG tablet; Take 1 tablet (10 mg total) by mouth at bedtime as needed.  Dispense: 30 tablet; Refill: 0 ? ?3. Other fatigue ? ?- Vitamin D, 25-hydroxy ?- Vitamin B12 ? ?4. Routine health maintenance ? ?- Vitamin D, 25-hydroxy ?- Vitamin B12 ? ?5. Class 3 severe obesity due to excess calories without serious comorbidity with body mass index (BMI) of 40.0 to 44.9 in adult [facility]) ? ?- Amb Ref to Medical Weight Management ? ? ?Follow up: ?Follow up in 3 months or sooner if needed ? ? ? ?

## 2022-01-05 ENCOUNTER — Other Ambulatory Visit: Payer: Self-pay | Admitting: Nurse Practitioner

## 2022-01-05 ENCOUNTER — Encounter: Payer: Self-pay | Admitting: Nurse Practitioner

## 2022-01-05 ENCOUNTER — Other Ambulatory Visit: Payer: Self-pay

## 2022-01-05 DIAGNOSIS — E559 Vitamin D deficiency, unspecified: Secondary | ICD-10-CM

## 2022-01-05 DIAGNOSIS — R0683 Snoring: Secondary | ICD-10-CM | POA: Insufficient documentation

## 2022-01-05 LAB — VITAMIN B12: Vitamin B-12: 756 pg/mL (ref 232–1245)

## 2022-01-05 LAB — VITAMIN D 25 HYDROXY (VIT D DEFICIENCY, FRACTURES): Vit D, 25-Hydroxy: 10.2 ng/mL — ABNORMAL LOW (ref 30.0–100.0)

## 2022-01-05 MED ORDER — VITAMIN D (ERGOCALCIFEROL) 1.25 MG (50000 UNIT) PO CAPS: 50000.0000 [IU] | ORAL_CAPSULE | ORAL | 1 refills | Status: DC

## 2022-01-05 NOTE — Assessment & Plan Note (Signed)
-   Ambulatory referral to Pulmonology ? ?2. Sleep disturbance ? ?- Ambulatory referral to Pulmonology ?- hydrOXYzine (ATARAX) 10 MG tablet; Take 1 tablet (10 mg total) by mouth at bedtime as needed.  Dispense: 30 tablet; Refill: 0 ? ?3. Other fatigue ? ?- Vitamin D, 25-hydroxy ?- Vitamin B12 ? ?4. Routine health maintenance ? ?- Vitamin D, 25-hydroxy ?- Vitamin B12 ? ?5. Class 3 severe obesity due to excess calories without serious comorbidity with body mass index (BMI) of 40.0 to 44.9 in adult [facility]) ? ?- Amb Ref to Medical Weight Management ? ? ?Follow up: ?Follow up in 3 months or sooner if needed ? ?

## 2022-01-06 ENCOUNTER — Ambulatory Visit: Payer: Medicaid Other | Admitting: Internal Medicine

## 2022-01-10 ENCOUNTER — Encounter: Payer: Self-pay | Admitting: Internal Medicine

## 2022-01-18 ENCOUNTER — Ambulatory Visit: Payer: Medicaid Other | Admitting: Nurse Practitioner

## 2022-01-23 ENCOUNTER — Other Ambulatory Visit: Payer: Self-pay | Admitting: Nurse Practitioner

## 2022-01-25 ENCOUNTER — Ambulatory Visit: Payer: Medicaid Other | Admitting: Nurse Practitioner

## 2022-01-25 ENCOUNTER — Encounter: Payer: Self-pay | Admitting: Nurse Practitioner

## 2022-01-25 VITALS — BP 123/83 | HR 100 | Temp 98.3°F | Ht 60.0 in | Wt 207.8 lb

## 2022-01-25 DIAGNOSIS — Z6841 Body Mass Index (BMI) 40.0 and over, adult: Secondary | ICD-10-CM | POA: Diagnosis not present

## 2022-01-25 DIAGNOSIS — N289 Disorder of kidney and ureter, unspecified: Secondary | ICD-10-CM | POA: Diagnosis not present

## 2022-01-25 DIAGNOSIS — K5900 Constipation, unspecified: Secondary | ICD-10-CM

## 2022-01-25 DIAGNOSIS — R935 Abnormal findings on diagnostic imaging of other abdominal regions, including retroperitoneum: Secondary | ICD-10-CM | POA: Diagnosis not present

## 2022-01-25 DIAGNOSIS — E2740 Unspecified adrenocortical insufficiency: Secondary | ICD-10-CM | POA: Diagnosis not present

## 2022-01-25 MED ORDER — SENNOSIDES-DOCUSATE SODIUM 8.6-50 MG PO TABS: 1.0000 | ORAL_TABLET | Freq: Every day | ORAL | 0 refills | Status: AC

## 2022-01-25 NOTE — Patient Instructions (Addendum)
1. Abnormal CT of the abdomen ? ?- Ambulatory referral to Nephrology ? ?2. Renal lesion ? ?- Ambulatory referral to Nephrology ? ?3. Constipation, unspecified constipation type ? ?- CBC ?- Comprehensive metabolic panel ?- senna-docusate (SENOKOT-S) 8.6-50 MG tablet; Take 1 tablet by mouth daily for 7 days.  Dispense: 7 tablet; Refill: 0 ? ?4. Adrenal insufficiency ([place]) ? ?- Ambulatory referral to Endocrinology ? ? ?5. Class 3 severe obesity due to excess calories without serious comorbidity with body mass index (BMI) of 40.0 to 44.9 in adult [facility]) ? ?- Amb Ref to Medical Weight Management ? ? ? ?Follow up: ? ?Follow up in 3 months or sooner if needed ? ?

## 2022-01-25 NOTE — Progress Notes (Incomplete)
$'@Patient'R$  ID: [name], female    DOB: 1956/04/12, 66 y.o.   MRN: [id number] ? ?Chief Complaint  ?Patient presents with  ?? Follow-up  ?  Pt is here for 2 weeks follow up. Pt stated she till has constipation pain  ? ? ?Referring provider: ?Fenton Foy, NP ? ? ?HPI ? ?Hospital follow up  ? ? ? ? ? ? ?Allergies  ?Allergen Reactions  ?? Aspirin Other (See Comments)  ?  Can take baby aspirin (heart flutters with high doses)  ?? Ibuprofen [Ibuprofen] Nausea And Vomiting  ?? Penicillins Nausea And Vomiting  ?  Has patient had a PCN reaction causing immediate rash, facial/tongue/throat swelling, SOB or lightheadedness with hypotension: Yes ?Has patient had a PCN reaction causing severe rash involving mucus membranes or skin necrosis: No ?Has patient had a PCN reaction that required hospitalization No ?Has patient had a PCN reaction occurring within the last 10 years: No ?If all of the above answers are "NO", then may proceed with Cephalosporin use. ?  ? ? ?Immunization History  ?Administered Date(s) Administered  ?? Influenza Split 08/03/2011, 07/18/2015  ?? Influenza,inj,Quad PF,6+ Mos 07/02/2016, 09/27/2018, 05/30/2021  ?? Tdap 09/27/2018  ? ? ?Past Medical History:  ?Diagnosis Date  ?? 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 Allergy   ?? Amblyopia   ?? Anemia   ?? Arthritis   ? "in my lower back"  ?? Asthma   ?? Atrial myxoma   ?? Blindness of left eye 1978  ?? Blood transfusion   ?? Breast cyst   ?? Constipation   ?? 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 CVA (cerebral infarction)   ?? Depression   ?? Depression   ?? GERD (gastroesophageal reflux disease)   ?? 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 Hemorrhoids   ?? History of open heart surgery 1977  ?? Hyperlipemia   ?? Hypothyroidism   ? per office note dated 03/25/2014  ?? Migraine   ?? Peptic ulcer associated with Helicobacter pylori infection   ?? Reflux   ?? Stroke [facility]) 1978  ? 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 ? ?Tobacco History: ?Social  History  ? ?Tobacco Use  ?Smoking Status Never  ?Smokeless Tobacco Never  ? ?Counseling given: Not Answered ? ? ?Outpatient Encounter Medications as of 01/25/2022  ?Medication Sig  ?? aspirin EC 81 MG tablet Take 81 mg by mouth daily. Swallow whole.  ?? EMGALITY 120 MG/ML SOAJ INJECT 120 MG into THE SKIN EVERY 30 DAYS (Patient taking differently: Inject 120 mg into the skin every 30 (thirty) days.)  ?? fenofibrate (TRICOR) 48 MG tablet Take 1 tablet (48 mg total) by mouth daily.  ?? flecainide (TAMBOCOR) 50 MG tablet Take 1 tablet (50 mg total) by mouth 2 (two) times daily.  ?? hydrocortisone (CORTEF) 20 MG tablet Take 0.5-1 tablets (10-20 mg total) by mouth 2 (two) times daily. Take 1 tablet every morning and 1/2 tablet every evening (Patient taking differently: Take 10-20 mg by mouth See admin instructions. 20 mg in the morning ?10 mg in the evening)  ?? hydrOXYzine (ATARAX) 10 MG tablet Take 1 tablet (10 mg total) by mouth at bedtime as needed.  ?? levothyroxine (SYNTHROID) 75 MCG tablet Take 1 tablet (75 mcg total) by mouth daily.  ?? methocarbamol (ROBAXIN) 500 MG tablet Take 1 tablet (500 mg total) by mouth 4 (four) times daily. (Patient taking differently: Take 500 mg by mouth every 8 (eight) hours as  needed for muscle spasms.)  ?? omega-3 acid ethyl esters (LOVAZA) 1 g capsule Take 1 capsule (1 g total) by mouth 2 (two) times daily.  ?? omeprazole (PRILOSEC) 20 MG capsule Take 20 mg by mouth 2 (two) times daily.  ?? Rimegepant Sulfate (NURTEC) 75 MG TBDP Take 75 mg by mouth daily as needed. For migraines. Take as close to onset of migraine as possible. One daily maximum.  ?? rosuvastatin (CRESTOR) 10 MG tablet Take 1 tablet (10 mg total) by mouth daily.  ?? venlafaxine XR (EFFEXOR-XR) 150 MG 24 hr capsule Take 1 capsule (150 mg total) by mouth daily.  ?? Vitamin D, Ergocalciferol, (DRISDOL) 1.25 MG (50000 UNIT) CAPS capsule Take 1 capsule (50,000 Units total) by mouth every 7 (seven) days.  ?? metoprolol  tartrate (LOPRESSOR) 25 MG tablet Take 1 tablet (25 mg total) by mouth 2 (two) times daily.  ?? nitroGLYCERIN (NITROSTAT) 0.4 MG SL tablet Place 1 tablet (0.4 mg total) under the tongue every 5 (five) minutes x 3 doses as needed for chest pain.  ? ?No facility-administered encounter medications on file as of 01/25/2022.  ? ? ? ?Review of Systems ? ?Review of Systems  ? ? ? ?Physical Exam ? ?BP 123/83 (BP Location: Right Arm, Patient Position: Sitting, Cuff Size: Normal)   Pulse 100   Temp 98.3 ?F (36.8 ?C)   Ht 5' (1.524 m)   Wt 207 lb 12.8 oz (94.3 kg)   SpO2 100%   BMI 40.58 kg/m?  ? ?Wt Readings from Last 5 Encounters:  ?01/25/22 207 lb 12.8 oz (94.3 kg)  ?01/04/22 215 lb 8 oz (97.8 kg)  ?12/24/21 205 lb 7.5 oz (93.2 kg)  ?10/13/21 206 lb (93.4 kg)  ?08/01/21 201 lb (91.2 kg)  ? ? ? ?Physical Exam ? ? ?Lab Results: ? ?CBC ?   ?Component Value Date/Time  ? WBC 6.8 12/25/2021 0119  ? RBC 4.24 12/25/2021 0119  ? HGB 12.5 12/25/2021 0119  ? HGB 14.9 11/25/2020 1350  ? HCT 38.0 12/25/2021 0119  ? HCT 44.8 11/25/2020 1350  ? PLT 237 12/25/2021 0119  ? PLT 258 11/25/2020 1350  ? MCV 89.6 12/25/2021 0119  ? MCV 90 11/25/2020 1350  ? MCH 29.5 12/25/2021 0119  ? MCHC 32.9 12/25/2021 0119  ? RDW 13.6 12/25/2021 0119  ? RDW 13.3 11/25/2020 1350  ? LYMPHSABS 3.5 12/24/2021 0059  ? LYMPHSABS 2.7 11/25/2020 1350  ? MONOABS 1.0 12/24/2021 0059  ? EOSABS 0.5 12/24/2021 0059  ? EOSABS 0.4 11/25/2020 1350  ? BASOSABS 0.0 12/24/2021 0059  ? BASOSABS 0.0 11/25/2020 1350  ? ? ?BMET ?   ?Component Value Date/Time  ? NA 134 (L) 12/25/2021 0119  ? NA 139 05/30/2021 1056  ? K 4.5 12/25/2021 0119  ? CL 103 12/25/2021 0119  ? CO2 20 (L) 12/25/2021 0119  ? GLUCOSE 160 (H) 12/25/2021 0119  ? BUN 12 12/25/2021 0119  ? BUN 22 05/30/2021 1056  ? CREATININE 1.03 (H) 12/25/2021 0119  ? CALCIUM 9.3 12/25/2021 0119  ? GFRNONAA >60 12/25/2021 0119  ? GFRAA 83 05/25/2020 1234  ? ? ?BNP ?No results found for: BNP ? ?ProBNP ?No results found for:  PROBNP ? ?Imaging: ?No results found. ? ? ?Assessment & Plan:  ? ?No problem-specific Assessment & Plan notes found for this encounter. ? ? ? ? ?Fenton Foy, NP ?01/25/2022 ? ?

## 2022-01-26 DIAGNOSIS — K5900 Constipation, unspecified: Secondary | ICD-10-CM | POA: Diagnosis not present

## 2022-01-27 LAB — COMPREHENSIVE METABOLIC PANEL
ALT: 15 IU/L (ref 0–32)
AST: 20 IU/L (ref 0–40)
Albumin/Globulin Ratio: 1.3 (ref 1.2–2.2)
Albumin: 4.3 g/dL (ref 3.8–4.8)
Alkaline Phosphatase: 85 IU/L (ref 44–121)
BUN/Creatinine Ratio: 18 (ref 12–28)
BUN: 20 mg/dL (ref 8–27)
Bilirubin Total: 0.3 mg/dL (ref 0.0–1.2)
CO2: 18 mmol/L — ABNORMAL LOW (ref 20–29)
Calcium: 10.2 mg/dL (ref 8.7–10.3)
Chloride: 100 mmol/L (ref 96–106)
Creatinine, Ser: 1.09 mg/dL — ABNORMAL HIGH (ref 0.57–1.00)
Globulin, Total: 3.2 g/dL (ref 1.5–4.5)
Glucose: 213 mg/dL — ABNORMAL HIGH (ref 70–99)
Potassium: 5 mmol/L (ref 3.5–5.2)
Sodium: 136 mmol/L (ref 134–144)
Total Protein: 7.5 g/dL (ref 6.0–8.5)
eGFR: 57 mL/min/{1.73_m2} — ABNORMAL LOW (ref >59)

## 2022-01-27 LAB — CBC
Hematocrit: 45.5 % (ref 34.0–46.6)
Hemoglobin: 14.9 g/dL (ref 11.1–15.9)
MCH: 29.7 pg (ref 26.6–33.0)
MCHC: 32.7 g/dL (ref 31.5–35.7)
MCV: 91 fL (ref 79–97)
Platelets: 341 10*3/uL (ref 150–450)
RBC: 5.01 x10E6/uL (ref 3.77–5.28)
RDW: 14.1 % (ref 11.7–15.4)
WBC: 7.3 10*3/uL (ref 3.4–10.8)

## 2022-01-30 ENCOUNTER — Encounter: Payer: Self-pay | Admitting: Nurse Practitioner

## 2022-01-30 DIAGNOSIS — R935 Abnormal findings on diagnostic imaging of other abdominal regions, including retroperitoneum: Secondary | ICD-10-CM | POA: Insufficient documentation

## 2022-01-30 NOTE — Progress Notes (Signed)
$'@Patient'S$  ID: [name], female    DOB: Mar 01, 1956, 66 y.o.   MRN: [id number]  Chief Complaint  Patient presents with   Follow-up    Pt is here for 2 weeks follow up. Pt stated she till has constipation pain    Referring provider: Fenton Foy, NP  Recent Significant Events:  Hospital admission: 12/23/21  Hospital Course:  1.  Adrenal insufficiency: Please ensure patient is taking her Cortef '20mg'$  in the morning '10mg'$  in the afternoon.  Please monitor for worsening signs of adrenal insufficiency and need to adjust medications.  May need further endocrinology follow-up as she is no longer following with prior endocrinologist.   Renal lesions: Noted to have multiple left kidney lesions incidentally on CT abdomen.  May need MRI follow-up to further characterize   2.  Labs / imaging needed at time of follow-up: BMP, MRI abdomen   3.  Pending labs/ test needing follow-up: none  HPI  Patient presents today for hospital follow-up.  She states that she has been doing well since hospital discharge.  We discussed that it was noted on imaging the patient did have multiple lesions to left kidney.  We will refer her to nephrology for further evaluation.  Patient may need MRI for follow-up.  Patient does need follow-up blood work today.  Patient has been referred to endocrinology and does have this appointment scheduled. Patient is requesting a referral for weight loss as well. Denies f/c/s, n/v/d, hemoptysis, PND, chest pain or edema.     Allergies  Allergen Reactions   Aspirin Other (See Comments)    Can take baby aspirin (heart flutters with high doses)   Ibuprofen [Ibuprofen] Nausea And Vomiting   Penicillins Nausea And Vomiting    Has patient had a PCN reaction causing immediate rash, facial/tongue/throat swelling, SOB or lightheadedness with hypotension: Yes Has patient had a PCN reaction causing severe rash involving mucus membranes or skin necrosis: No Has patient had a  PCN reaction that required hospitalization No Has patient had a PCN reaction occurring within the last 10 years: No If all of the above answers are "NO", then may proceed with Cephalosporin use.     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 Swallow whole.   EMGALITY 120 MG/ML SOAJ INJECT 120 MG into THE SKIN EVERY 30 DAYS (Patient taking differently: Inject 120 mg into the skin every 30 (thirty) days.)   fenofibrate (TRICOR) 48 MG tablet Take 1 tablet (48 mg total) by mouth daily.   flecainide (TAMBOCOR) 50 MG tablet Take 1 tablet (50 mg total) by mouth 2 (two) times daily.   hydrocortisone (CORTEF) 20 MG tablet Take 0.5-1 tablets (10-20 mg total) by mouth 2 (two) times daily. Take 1 tablet every morning and 1/2 tablet every evening (Patient taking differently: Take 10-20 mg by mouth See  admin instructions. 20 mg in the morning 10 mg in the evening)   hydrOXYzine (ATARAX) 10 MG tablet Take 1 tablet (10 mg total) by mouth at bedtime as needed.   levothyroxine (SYNTHROID) 75 MCG tablet Take 1 tablet (75 mcg total) by mouth daily.   methocarbamol (ROBAXIN) 500 MG tablet Take 1 tablet (500 mg total) by mouth 4 (four) times daily. (Patient taking differently: Take 500 mg by mouth every 8 (eight) hours as needed for muscle spasms.)   omega-3 acid ethyl esters (LOVAZA) 1 g capsule Take 1 capsule (1 g total) by mouth 2 (two) times daily.   omeprazole (PRILOSEC) 20 MG capsule Take 20 mg by mouth 2 (two) times daily.   Rimegepant Sulfate (NURTEC) 75 MG TBDP Take 75 mg by mouth daily as needed. For migraines. Take as close to onset of migraine as possible. One daily maximum.   rosuvastatin (CRESTOR) 10 MG tablet Take 1 tablet (10 mg total) by mouth daily.   senna-docusate (SENOKOT-S) 8.6-50 MG tablet Take 1 tablet by mouth daily for 7 days.   venlafaxine XR (EFFEXOR-XR) 150 MG 24 hr capsule Take 1 capsule (150 mg total) by mouth daily.   Vitamin D, Ergocalciferol, (DRISDOL) 1.25 MG (50000 UNIT) CAPS capsule Take 1 capsule (50,000 Units total) by mouth every 7 (seven) days.   metoprolol tartrate (LOPRESSOR) 25 MG tablet Take 1 tablet (25 mg total) by mouth 2 (two) times daily.   nitroGLYCERIN (NITROSTAT) 0.4 MG SL tablet Place 1 tablet (0.4 mg total) under the tongue every 5 (five) minutes x 3 doses as needed for chest pain.   No facility-administered encounter medications on file as of 01/25/2022.     Review of Systems  Review of Systems  Constitutional: Negative.   HENT: Negative.    Cardiovascular: Negative.   Gastrointestinal: Negative.   Allergic/Immunologic: Negative.   Neurological: Negative.   Psychiatric/Behavioral: Negative.        Physical Exam  BP 123/83 (BP Location: Right Arm, Patient Position: Sitting, Cuff Size: Normal)   Pulse 100   Temp 98.3 F (36.8 C)    Ht 5' (1.524 m)   Wt 207 lb 12.8 oz (94.3 kg)   SpO2 100%   BMI 40.58 kg/m   Wt Readings from Last 5 Encounters:  01/25/22 207 lb 12.8 oz (94.3 kg)  01/04/22 215 lb 8 oz (97.8 kg)  12/24/21 205 lb 7.5 oz (93.2 kg)  10/13/21 206 lb (93.4 kg)  08/01/21 201 lb (91.2 kg)     Physical Exam Vitals and nursing note reviewed.  Constitutional:      General: She is not in acute distress.    Appearance: She is well-developed.  Cardiovascular:     Rate and Rhythm: Normal rate and regular rhythm.  Pulmonary:     Effort: Pulmonary effort is normal.  Breath sounds: Normal breath sounds.  Neurological:     Mental Status: She is alert and oriented to person, place, and time.     Lab Results:  CBC    Component Value Date/Time   WBC 7.3 01/26/2022 0840   WBC 6.8 12/25/2021 0119   RBC 5.01 01/26/2022 0840   RBC 4.24 12/25/2021 0119   HGB 14.9 01/26/2022 0840   HCT 45.5 01/26/2022 0840   PLT 341 01/26/2022 0840   MCV 91 01/26/2022 0840   MCH 29.7 01/26/2022 0840   MCH 29.5 12/25/2021 0119   MCHC 32.7 01/26/2022 0840   MCHC 32.9 12/25/2021 0119   RDW 14.1 01/26/2022 0840   LYMPHSABS 3.5 12/24/2021 0059   LYMPHSABS 2.7 11/25/2020 1350   MONOABS 1.0 12/24/2021 0059   EOSABS 0.5 12/24/2021 0059   EOSABS 0.4 11/25/2020 1350   BASOSABS 0.0 12/24/2021 0059   BASOSABS 0.0 11/25/2020 1350    BMET    Component Value Date/Time   NA 136 01/26/2022 0840   K 5.0 01/26/2022 0840   CL 100 01/26/2022 0840   CO2 18 (L) 01/26/2022 0840   GLUCOSE 213 (H) 01/26/2022 0840   GLUCOSE 160 (H) 12/25/2021 0119   BUN 20 01/26/2022 0840   CREATININE 1.09 (H) 01/26/2022 0840   CALCIUM 10.2 01/26/2022 0840   GFRNONAA >60 12/25/2021 0119   GFRAA 83 05/25/2020 1234    BNP No results found for: BNP  ProBNP No results found for: PROBNP  Imaging: No results found.   Assessment & Plan:   Abnormal CT of the abdomen - Ambulatory referral to Nephrology  2. Renal lesion  - Ambulatory  referral to Nephrology  3. Constipation, unspecified constipation type  - CBC - Comprehensive metabolic panel - senna-docusate (SENOKOT-S) 8.6-50 MG tablet; Take 1 tablet by mouth daily for 7 days.  Dispense: 7 tablet; Refill: 0  4. Adrenal insufficiency ([place])  - Ambulatory referral to Endocrinology   5. Class 3 severe obesity due to excess calories without serious comorbidity with body mass index (BMI) of 40.0 to 44.9 in adult (HCC)  - Amb Ref to Medical Weight Management    Follow up:  Follow up in 3 months or sooner if needed     Fenton Foy, NP 01/30/2022

## 2022-01-30 NOTE — Assessment & Plan Note (Signed)
-   Ambulatory referral to Nephrology  2. Renal lesion  - Ambulatory referral to Nephrology  3. Constipation, unspecified constipation type  - CBC - Comprehensive metabolic panel - senna-docusate (SENOKOT-S) 8.6-50 MG tablet; Take 1 tablet by mouth daily for 7 days.  Dispense: 7 tablet; Refill: 0  4. Adrenal insufficiency ([place])  - Ambulatory referral to Endocrinology   5. Class 3 severe obesity due to excess calories without serious comorbidity with body mass index (BMI) of 40.0 to 44.9 in adult (HCC)  - Amb Ref to Medical Weight Management    Follow up:  Follow up in 3 months or sooner if needed

## 2022-02-03 ENCOUNTER — Ambulatory Visit (INDEPENDENT_AMBULATORY_CARE_PROVIDER_SITE_OTHER): Payer: Medicaid Other | Admitting: Pulmonary Disease

## 2022-02-03 ENCOUNTER — Encounter: Payer: Self-pay | Admitting: Pulmonary Disease

## 2022-02-03 VITALS — BP 120/78 | HR 82 | Temp 99.4°F | Ht 60.0 in | Wt 208.0 lb

## 2022-02-03 DIAGNOSIS — R0683 Snoring: Secondary | ICD-10-CM

## 2022-02-03 NOTE — Progress Notes (Signed)
[name]    [id number]    [birth year]  Primary Care Physician:Nichols, Kriste Basque, NP  Referring Physician: Fenton Foy, NP [facility] [address] [place],  [place] [address]  Chief complaint:   Patient being seen for snoring, difficulty falling asleep and difficulty staying asleep  HPI:  Longstanding history of snoring Longstanding history of difficulty falling and staying asleep She does have multiple awakenings She does have significant snoring, witnessed apneas  Occasional headaches  She has no pets Never smoker  Dad snored heavy  She does have history of frequent PVCs, hypothyroidism, adrenal insufficiency  Outpatient Encounter Medications as of 02/03/2022  Medication Sig   aspirin EC 81 MG tablet Take 81 mg by mouth daily. Swallow whole.   EMGALITY 120 MG/ML SOAJ INJECT 120 MG into THE SKIN EVERY 30 DAYS (Patient taking differently: Inject 120 mg into the skin every 30 (thirty) days.)   fenofibrate (TRICOR) 48 MG tablet Take 1 tablet (48 mg total) by mouth daily.   flecainide (TAMBOCOR) 50 MG tablet Take 1 tablet (50 mg total) by mouth 2 (two) times daily.   hydrocortisone (CORTEF) 20 MG tablet Take 0.5-1 tablets (10-20 mg total) by mouth 2 (two) times daily. Take 1 tablet every morning and 1/2 tablet every evening (Patient taking differently: Take 10-20 mg by mouth See admin instructions. 20 mg in the morning 10 mg in the evening)   hydrOXYzine (ATARAX) 10 MG tablet Take 1 tablet (10 mg total) by mouth at bedtime as needed.   levothyroxine (SYNTHROID) 75 MCG tablet Take 1 tablet (75 mcg total) by mouth daily.   methocarbamol (ROBAXIN) 500 MG tablet Take 1 tablet (500 mg total) by mouth 4 (four) times daily. (Patient taking differently: Take 500 mg by mouth every 8 (eight) hours as needed for muscle spasms.)   nitroGLYCERIN (NITROSTAT) 0.4 MG SL tablet Place 1 tablet (0.4 mg total) under the tongue every 5 (five) minutes x 3 doses as needed for chest  pain.   omega-3 acid ethyl esters (LOVAZA) 1 g capsule Take 1 capsule (1 g total) by mouth 2 (two) times daily.   omeprazole (PRILOSEC) 20 MG capsule Take 20 mg by mouth 2 (two) times daily.   Rimegepant Sulfate (NURTEC) 75 MG TBDP Take 75 mg by mouth daily as needed. For migraines. Take as close to onset of migraine as possible. One daily maximum.   rosuvastatin (CRESTOR) 10 MG tablet Take 1 tablet (10 mg total) by mouth daily.   venlafaxine XR (EFFEXOR-XR) 150 MG 24 hr capsule Take 1 capsule (150 mg total) by mouth daily.   Vitamin D, Ergocalciferol, (DRISDOL) 1.25 MG (50000 UNIT) CAPS capsule Take 1 capsule (50,000 Units total) by mouth every 7 (seven) days.   metoprolol tartrate (LOPRESSOR) 25 MG tablet Take 1 tablet (25 mg total) by mouth 2 (two) times daily.   No facility-administered encounter medications on file as of 02/03/2022.    Allergies as of 02/03/2022 - Review Complete 02/03/2022  Allergen Reaction Noted   Aspirin Other (See Comments) 10/03/2011   Ibuprofen [ibuprofen] Nausea And Vomiting 08/02/2011   Penicillins Nausea And Vomiting 06/02/2011    Past Medical History:  Diagnosis Date   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HCC)    Allergy    Amblyopia    Anemia    Arthritis    "in my lower back"   Asthma    Atrial myxoma    Blindness of left eye 1978  Blood transfusion    Breast cyst    Constipation    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H[facility])    CVA (cerebral infarction)    Depression    Depression    GERD (gastroesophageal reflux disease)    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HCC)    Hemorrhoids    History of open heart surgery 1977   Hyperlipemia    Hypothyroidism    per office note dated 03/25/2014   Migraine    Peptic ulcer associated with Helicobacter pylori infection    Reflux    Stroke [facility]) 1978   "piece tumor broke off & went to left eye; leaving me blind"    Past Surgical History:  Procedure Laterality Date   ADRENAL GLAND SURGERY  09/11/1980   on hydrocortisone   ATRIAL  MYXOMA EXCISION  09/11/1976   BREAST BIOPSY  07/13/1999   ductal tissue excision & bx right; bx only left breast   COLONOSCOPY     EYE SURGERY     LEFT HEART CATHETERIZATION WITH CORONARY ANGIOGRAM N/A 10/06/2011   Procedure: LEFT HEART CATHETERIZATION WITH CORONARY ANGIOGRAM;  Surgeon: Clent Demark, MD;  Location: [facility]ATH [facility];  Service: Cardiovascular;  Laterality: N/A;   ORBITOTOMY Right 07/27/2021   POLYPECTOMY     repaired cross eyed     "as a child"   ROTATOR CUFF REPAIR  05/12/2005   left   VAGINAL HYSTERECTOMY  09/11/1998   "partial; w/left ovary"    Family History  Problem Relation Age of Onset   Acromegaly Father    Heart disease Father        Tumor in the heart   Stomach cancer Father    Colon cancer Sister 43   Diabetes Sister    Kidney disease Sister        x2   Heart disease Sister        Tumor in the heart   Heart disease Daughter        Tumor in the heart   Colon polyps Neg Hx    Esophageal cancer Neg Hx    Gallbladder disease Neg Hx    Migraines Neg Hx    Rectal cancer Neg Hx     Social History   Socioeconomic History   Marital status: Married    Spouse name: Not on file   Number of children: 2   Years of education: Not on file   Highest education level: Not on file  Occupational History   Occupation: Disabled  Tobacco Use   Smoking status: Never   Smokeless tobacco: Never  Vaping Use   Vaping Use: Never used  Substance and Sexual Activity   Alcohol use: No   Drug use: No   Sexual activity: Not Currently  Other Topics Concern   Not on file  Social History Narrative   Lives with sister in law right now   Left handed   Caffeine: none    Social Determinants of Health   Financial Resource Strain: Not on file  Food Insecurity: Not on file  Transportation Needs: Not on file  Physical Activity: Not on file  Stress: Not on file  Social Connections: Not on file  Intimate Partner Violence: Not on file    Review of Systems   Psychiatric/Behavioral:  Positive for sleep disturbance.    Vitals:   02/03/22 1507  BP: 120/78  Pulse: 82  Temp: 99.4 F (37.4 C)  SpO2: 98%     Physical Exam Constitutional:      Appearance: She  is obese.  HENT:     Head: Normocephalic.     Nose: No congestion.     Mouth/Throat:     Mouth: Mucous membranes are moist.  Cardiovascular:     Rate and Rhythm: Normal rate and regular rhythm.     Heart sounds: No murmur heard.   No friction rub.  Pulmonary:     Effort: No respiratory distress.     Breath sounds: No stridor. No wheezing or rhonchi.  Musculoskeletal:     Cervical back: No rigidity or tenderness.  Neurological:     Mental Status: She is alert.  Psychiatric:        Mood and Affect: Mood normal.      02/03/2022    3:00 PM  Results of the Epworth flowsheet  Sitting and reading 0  Watching TV 0  Sitting, inactive in a public place (e.g. a theatre or a meeting) 0  As a passenger in a car for an hour without a break 0  Lying down to rest in the afternoon when circumstances permit 1  Sitting and talking to someone 0  Sitting quietly after a lunch without alcohol 0  In a car, while stopped for a few minutes in traffic 0  Total score 1     Data Reviewed: No previous sleep study on record  Assessment:  Moderate probability of significant obstructive sleep apnea  She does have significant difficulty initiating or maintaining sleep  Possibility of falsely negative home sleep study was discussed with the patient  Patient will be best served with an in lab split-night study  Pathophysiology of sleep disordered breathing discussed with the patient  Treatment options discussed with the patient  Insomnia  Plan/Recommendations: Schedule patient for an in lab polysomnogram  Sleep aid may be tried following diagnosis and initiation of treatment for sleep disordered breathing  Encouraged weight loss efforts  Encouraged regular exercises   Sherrilyn Rist MD [facility] 02/03/2022, 3:35 PM  CC: Fenton Foy, NP

## 2022-02-03 NOTE — Patient Instructions (Addendum)
schedule patient for an in lab split-night study  We will call you to let you know the results as soon as we reviewed the study  CPAP therapy will be tried on the day of the study  If we do not find significant sleep apnea, we can start you on a sleep aid  Continue weight loss efforts  Call with significant concerns

## 2022-02-08 ENCOUNTER — Other Ambulatory Visit: Payer: Self-pay | Admitting: Nurse Practitioner

## 2022-02-08 DIAGNOSIS — E781 Pure hyperglyceridemia: Secondary | ICD-10-CM

## 2022-02-10 ENCOUNTER — Other Ambulatory Visit: Payer: Self-pay | Admitting: Nurse Practitioner

## 2022-02-10 DIAGNOSIS — N289 Disorder of kidney and ureter, unspecified: Secondary | ICD-10-CM

## 2022-02-22 ENCOUNTER — Encounter: Payer: Self-pay | Admitting: Neurology

## 2022-02-22 ENCOUNTER — Ambulatory Visit: Payer: Medicaid Other | Admitting: Neurology

## 2022-02-22 VITALS — BP 122/75 | HR 76 | Ht 60.0 in | Wt 218.0 lb

## 2022-02-22 DIAGNOSIS — I209 Angina pectoris, unspecified: Secondary | ICD-10-CM

## 2022-02-22 DIAGNOSIS — G44011 Episodic cluster headache, intractable: Secondary | ICD-10-CM | POA: Diagnosis not present

## 2022-02-22 DIAGNOSIS — G43109 Migraine with aura, not intractable, without status migrainosus: Secondary | ICD-10-CM

## 2022-02-22 DIAGNOSIS — Z8616 Personal history of COVID-19: Secondary | ICD-10-CM

## 2022-02-22 DIAGNOSIS — G4710 Hypersomnia, unspecified: Secondary | ICD-10-CM | POA: Diagnosis not present

## 2022-02-22 DIAGNOSIS — Z6841 Body Mass Index (BMI) 40.0 and over, adult: Secondary | ICD-10-CM | POA: Insufficient documentation

## 2022-02-22 DIAGNOSIS — J9601 Acute respiratory failure with hypoxia: Secondary | ICD-10-CM

## 2022-02-22 DIAGNOSIS — G4752 REM sleep behavior disorder: Secondary | ICD-10-CM | POA: Diagnosis not present

## 2022-02-22 DIAGNOSIS — R0681 Apnea, not elsewhere classified: Secondary | ICD-10-CM | POA: Insufficient documentation

## 2022-02-22 DIAGNOSIS — I4729 Other ventricular tachycardia: Secondary | ICD-10-CM

## 2022-02-22 DIAGNOSIS — F5104 Psychophysiologic insomnia: Secondary | ICD-10-CM | POA: Insufficient documentation

## 2022-02-22 NOTE — Patient Instructions (Signed)
Quality Sleep Information, Adult Quality sleep is important for your mental and physical health. It also improves your quality of life. Quality sleep means you: Are asleep for most of the time you are in bed. Fall asleep within 30 minutes. Wake up no more than once a night.  Are awake for no longer than 20 minutes if you do wake up during the night. Most adults need 7-8 hours of quality sleep each night. How can poor sleep affect me? If you do not get enough quality sleep, you may have: Mood swings. Daytime sleepiness. Confusion. Decreased reaction time. Sleep disorders, such as insomnia and sleep apnea. Difficulty with: Solving problems. Coping with stress. Paying attention. These issues may affect your performance and productivity at work, school, and at home. Lack of sleep may also put you at higher risk for accidents, suicide, and risky behaviors. If you do not get quality sleep you may also be at higher risk for several health problems, including: Infections. Type 2 diabetes. Heart disease. High blood pressure. Obesity. Worsening of long-term conditions, like arthritis, kidney disease, depression, Parkinson's disease, and epilepsy. What actions can I take to get more quality sleep?     Stick to a sleep schedule. Go to sleep and wake up at about the same time each day. Do not try to sleep less on weekdays and make up for lost sleep on weekends. This does not work. Try to get about 30 minutes of exercise on most days. Do not exercise 2-3 hours before going to bed. Limit naps during the day to 30 minutes or less. Do not use any products that contain nicotine or tobacco, such as cigarettes or e-cigarettes. If you need help quitting, ask your health care provider. Do not drink caffeinated beverages for at least 8 hours before going to bed. Coffee, tea, and some sodas contain caffeine. Do not drink alcohol close to bedtime. Do not eat large meals close to bedtime. Do not take naps  in the late afternoon. Try to get at least 30 minutes of sunlight every day. Morning sunlight is best. Make time to relax before bed. Reading, listening to music, or taking a hot bath promotes quality sleep. Make your bedroom a place that promotes quality sleep. Keep your bedroom dark, quiet, and at a comfortable room temperature. Make sure your bed is comfortable. Take out sleep distractions like TV, a computer, smartphone, and bright lights. If you are lying awake in bed for longer than 20 minutes, get up and do a relaxing activity until you feel sleepy. Work with your health care provider to treat medical conditions that may affect sleeping, such as: Nasal obstruction. Snoring. Sleep apnea and other sleep disorders. Talk to your health care provider if you think any of your prescription medicines may cause you to have difficulty falling or staying asleep. If you have sleep problems, talk with a sleep consultant. If you think you have a sleep disorder, talk with your health care provider about getting evaluated by a specialist. Where to find more information National Sleep Foundation website: https://sleepfoundation.org National Heart, Lung, and Blood Institute (NHLBI): www.nhlbi.nih.gov/files/docs/public/sleep/healthy_sleep.pdf Centers for Disease Control and Prevention (CDC): www.cdc.gov/sleep/index.html Contact a health care provider if you: Have trouble getting to sleep or staying asleep. Often wake up very early in the morning and cannot get back to sleep. Have daytime sleepiness. Have daytime sleep attacks of suddenly falling asleep and sudden muscle weakness (narcolepsy). Have a tingling sensation in your legs with a strong urge to move your   legs (restless legs syndrome). Stop breathing briefly during sleep (sleep apnea). Think you have a sleep disorder or are taking a medicine that is affecting your quality of sleep. Summary Most adults need 7-8 hours of quality sleep each  night. Getting enough quality sleep is an important part of health and well-being. Make your bedroom a place that promotes quality sleep and avoid things that may cause you to have poor sleep, such as alcohol, caffeine, smoking, and large meals. Talk to your health care provider if you have trouble falling asleep or staying asleep. This information is not intended to replace advice given to you by your health care provider. Make sure you discuss any questions you have with your health care provider. Document Revised: 10/03/2021 Document Reviewed: 06/27/2021 Elsevier Patient Education  [place]. Hypersomnia Hypersomnia is a condition in which a person feels very tired during the day even though the person gets plenty of sleep at night. A person with this condition may take naps during the day and may find it very difficult to wake up from sleep. Hypersomnia may affect a person's ability to think, concentrate, drive, or remember things. What are the causes? The cause of this condition may not be known. Possible causes include: Taking certain medicines. Using drugs or alcohol. Sleep disorders, such as narcolepsy and sleep apnea. Injury to the head, brain, or spinal cord. Tumors. Certain medical conditions. These include: Depression. Diabetes. Gastroesophageal reflux disease (GERD). An underactive thyroid gland (hypothyroidism). What are the signs or symptoms? The main symptoms of hypersomnia include: Feeling very tired throughout the day, regardless of how much sleep you got the night before. Having trouble waking up. Others may find it difficult to wake you up when you are sleeping. Sleeping for longer and longer periods at a time. Taking naps throughout the day. Other symptoms may include: Feeling restless, anxious, or annoyed. Lacking energy. Having trouble with: Remembering. Speaking. Thinking. Loss of appetite. Seeing, hearing, tasting, smelling, or feeling things that  are not real (hallucinations). How is this diagnosed? This condition may be diagnosed based on: Your symptoms and medical history. Your sleeping habits. Your health care provider may ask you to write down your sleeping habits in a daily sleep log, along with any symptoms you have. A series of tests that are done while you sleep (sleep study or polysomnogram). A test that measures how quickly you can fall asleep during the day (daytime nap study or multiple sleep latency test). How is this treated? This condition may be treated by: Following a regular sleep routine. Making lifestyle changes, such as changing your eating habits, getting regular exercise, and avoiding alcohol or caffeinated beverages. Taking medicines to make you more alert (stimulants) during the day. Treating any underlying medical causes of hypersomnia. Follow these instructions at home: Sleep habits Stick to a routine that includes going to bed and waking up at the same times every day and night. Practice a relaxing bedtime routine. This may include reading, meditation, deep breathing, or taking a warm bath before going to sleep. Exercise regularly as told by your health care provider. However, avoid exercising in the hours right before bedtime. Keep your sleep environment at a cooler temperature, darkened, and quiet. Sleep with pillows and a mattress that are comfortable and supportive. Schedule short 20-minute naps for when you feel sleepiest during the day. Talk with your employer or teachers about your hypersomnia. If possible, adjust your schedule so that: You have a regular daytime work schedule. You can  take a scheduled nap during the day. You do not have to work or be active at night. Do not eat a heavy meal for a few hours before bedtime. Eat your meals at about the same times every day. Safety  Do not drive or use machinery if you are sleepy. Ask your health care provider if it is safe for you to drive. Wear a  life jacket when swimming or spending time near water. General instructions  Take over-the-counter and prescription medicines only as told by your health care provider. This includes supplements. Avoid drinking alcohol or caffeinated beverages. Keep a sleep log that will help your health care provider manage your condition. This may include information about: What time you go to bed each night. How often you wake up at night. How many hours you sleep at night. How often and for how long you nap during the day. Any observations from others, such as leg movements during sleep, sleep walking, or snoring. Keep all follow-up visits. This is important. Contact a health care provider if: You have new symptoms. Your symptoms get worse. Get help right away if: You have thoughts about hurting yourself or someone else. Get help right away if you feel like you may hurt yourself or others, or have thoughts about taking your own life. Go to your nearest emergency room or: Call 911. Call the [facility] at [phone] or 988. This is open 24 hours a day. Text the Crisis Text Line at [phone]. Summary Hypersomnia refers to a condition in which you feel very tired during the day even though you get plenty of sleep at night. A person with this condition may take naps during the day and may find it very difficult to wake up from sleep. Hypersomnia may affect a person's ability to think, concentrate, drive, or remember things. Treatment may include a regular sleep routine and making some lifestyle changes. This information is not intended to replace advice given to you by your health care provider. Make sure you discuss any questions you have with your health care provider. Document Revised: 08/08/2021 Document Reviewed: 08/08/2021 Elsevier Patient Education  [place].

## 2022-02-22 NOTE — Progress Notes (Signed)
SLEEP MEDICINE CLINIC    Provider:  Larey Seat, MD  Primary Care Physician:  Mikayla Foy, NP [place] [address] [place] [address]     Referring Provider: Vevelyn Francois, [place] [address] [place],  [place] [address]          Chief Complaint according to patient   Patient presents with:     New Patient (Initial Visit)           HISTORY OF PRESENT ILLNESS:  [name] is a 66 y.o. year old 45 or [place] American female patient seen here as upon Internal  referral on 02/22/2022 from [name] for a Sleep evaluation.  Chief concern according to patient :  " I have trouble falling asleep for many years, once asleep I cant stay asleep, I wake up-  often by my own snoring. Sometimes woken by sharp , stabbing headaches- cluster . 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 "   I have the pleasure of seeing [name] today, a right -handed Black or [place] American female with a possible sleep disorder.  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   Sleep relevant medical history: obesity, snoring, Sleep walking in childhood,  she was usually asleep promptly when in  car- ice cream headaches.she reports hot flushes at night and in daytime.   Family medical /sleep history: daughter with migraine and insomnia, mother was insomniac.   Sister on CPAP with OSA, insomnia, sleep walkers.    Social history:  Patient is retired from working since 1978-   and lives in a household with 4 persons, Family status is married an lives with spouse, baby brother and son. Tobacco use; none.   ETOH use : rarely,  Rare Caffeine intake . Regular exercise in form of -none .   Hobbies : arts and craft, puzzle.       Sleep habits are as follows: The patient's dinner time is between 6-7 PM. The patient goes to bed at 10 PM and sometimes needing until 4-5 AM to  sleep . Cyclic sleep disorder.  She  wakes for 3-4 bathroom breaks, the first time at 1 AM.   The preferred sleep position is left sided, with the support of 1 pillow. Dreams are reportedly rare/ infrequent/. She is a sleep talker,  she snores.  9  AM is the  alarm time for medication- usual rise time. She reports not feeling refreshed or restored in AM, with symptoms such as dry mouth, morning headaches, and residual fatigue.  Naps are taken regularly, lasting from 1-2 hours and are more refreshing than nocturnal sleep.    Review of Systems: Out of a complete 14 system review, the patient complains of only the following symptoms, and all other reviewed systems are negative.:  Fatigue, sleepiness , snoring, fragmented sleep, Insomnia cyclic, sleep headaches.    How likely are you to doze in the following situations: 0 = not likely, 1 = slight chance, 2 = moderate chance, 3 = high chance  Sitting and Reading? Watching Television? Sitting inactive in a public place (theater or meeting)? As a passenger in a car for an hour without a break? Lying down in the afternoon when circumstances permit? Sitting and talking to someone? Sitting quietly after lunch without alcohol? In a car, while stopped for a few minutes in traffic?   Total = 11/ 24 points   FSS endorsed at 42/ 63 points.   Sleep headaches, AM and cluster.   Social History   Socioeconomic History   Marital status: Married    Spouse name: [name]   Number of children: 2   Years of education: Not on file   Highest education level:  Some college, no degree  Occupational History   Occupation: Disabled  Tobacco Use   Smoking status: Never   Smokeless tobacco: Never  Vaping Use   Vaping Use: Never used  Substance and Sexual Activity   Alcohol use: No   Drug use: No   Sexual activity: Not Currently  Other Topics Concern   Not on file  Social History Narrative   Lives w husband and son   Left handed   Caffeine: none    Social Determinants of Health   Financial Resource Strain: Not on file  Food Insecurity: Not on file  Transportation Needs: Not on file  Physical Activity: Not on file  Stress: Not on file  Social Connections: Not on file    Family History  Problem Relation Age of Onset   Acromegaly Father    Heart disease Father        Tumor in the heart   Stomach cancer Father    Colon cancer Sister 42   Diabetes Sister    Kidney disease Sister        x2   Heart disease Sister        Tumor in the heart   Heart disease Daughter        Tumor in the heart   Colon polyps Neg Hx    Esophageal cancer Neg Hx    Gallbladder disease Neg Hx    Migraines Neg Hx    Rectal cancer Neg Hx     Past Medical History:  Diagnosis Date   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HCC)    Allergy    Amblyopia    Anemia    Arthritis    "in my lower back"   Asthma    Atrial myxoma    Blindness of left eye 1978   Blood transfusion    Breast cyst    Constipation    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HCC)    CVA (cerebral infarction)    Depression    Depression    GERD (gastroesophageal reflux disease)    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[place])    Hemorrhoids    History of open heart surgery 1977   Hyperlipemia    Hypothyroidism    per office note dated 03/25/2014   Migraine    Peptic ulcer associated with Helicobacter pylori infection    Reflux    Stroke ([place]) 1978   "piece tumor broke off & went to left eye; leaving me blind"    Past Surgical History:  Procedure Laterality Date   ADRENAL GLAND SURGERY  09/11/1980   on hydrocortisone    ATRIAL MYXOMA EXCISION  09/11/1976   BREAST BIOPSY  07/13/1999   ductal tissue excision & bx right; bx only left breast   COLONOSCOPY     EYE SURGERY     LEFT HEART CATHETERIZATION WITH CORONARY  ANGIOGRAM N/A 10/06/2011   Procedure: LEFT HEART CATHETERIZATION WITH CORONARY ANGIOGRAM;  Surgeon: Clent Demark, MD;  Location: [facility] CATH LAB;  Service: Cardiovascular;  Laterality: N/A;   ORBITOTOMY Right 07/27/2021   POLYPECTOMY     repaired cross eyed     "as a child"   ROTATOR CUFF REPAIR  05/12/2005   left   VAGINAL HYSTERECTOMY  09/11/1998   "partial; w/left ovary"     Current Outpatient Medications on File Prior to Visit  Medication Sig Dispense Refill   aspirin EC 81 MG tablet Take 81 mg by mouth daily. Swallow whole.     EMGALITY 120 MG/ML SOAJ INJECT 120 MG into THE SKIN EVERY 30 DAYS (Patient taking differently: Inject 120 mg into the skin every 30 (thirty) days.) 1 mL 10   fenofibrate (TRICOR) 48 MG tablet Take 1 tablet (48 mg total) by mouth daily. 90 tablet 3   flecainide (TAMBOCOR) 50 MG tablet Take 1 tablet (50 mg total) by mouth 2 (two) times daily. 60 tablet 11   hydrocortisone (CORTEF) 20 MG tablet Take 0.5-1 tablets (10-20 mg total) by mouth 2 (two) times daily. Take 1 tablet every morning and 1/2 tablet every evening (Patient taking differently: Take 10-20 mg by mouth See admin instructions. 20 mg in the morning 10 mg in the evening) 45 tablet o   hydrOXYzine (ATARAX) 10 MG tablet Take 1 tablet (10 mg total) by mouth at bedtime as needed. 30 tablet 0   levothyroxine (SYNTHROID) 75 MCG tablet Take 1 tablet (75 mcg total) by mouth daily. 30 tablet 0   methocarbamol (ROBAXIN) 500 MG tablet Take 1 tablet (500 mg total) by mouth 4 (four) times daily. (Patient taking differently: Take 500 mg by mouth every 8 (eight) hours as needed for muscle spasms.) 30 tablet 0   nitroGLYCERIN (NITROSTAT) 0.4 MG SL tablet Place 1 tablet (0.4 mg total) under the tongue every 5 (five) minutes x  3 doses as needed for chest pain. 25 tablet 12   omega-3 acid ethyl esters (LOVAZA) 1 g capsule Take 1 capsule (1 g total) by mouth 2 (two) times daily. 60 capsule 3   omeprazole (PRILOSEC) 20 MG capsule Take 20 mg by mouth 2 (two) times daily.     Rimegepant Sulfate (NURTEC) 75 MG TBDP Take 75 mg by mouth daily as needed. For migraines. Take as close to onset of migraine as possible. One daily maximum. 8 tablet 6   rosuvastatin (CRESTOR) 10 MG tablet Take 1 tablet (10 mg total) by mouth daily. 30 tablet 11   venlafaxine XR (EFFEXOR-XR) 150 MG 24 hr capsule Take 1 capsule (150 mg total) by mouth daily. 30 capsule 11   Vitamin D, Ergocalciferol, (DRISDOL) 1.25 MG (50000 UNIT) CAPS capsule Take 1 capsule (50,000 Units total) by mouth every 7 (seven) days. 5 capsule 1   metoprolol tartrate (LOPRESSOR) 25 MG tablet Take 1 tablet (25 mg total) by mouth 2 (two) times daily. 180 tablet 3   No current facility-administered medications on file prior to visit.    Allergies  Allergen Reactions   Aspirin Other (See Comments)    Can take baby aspirin (heart flutters with high doses)   Ibuprofen [Ibuprofen] Nausea And Vomiting   Penicillins Nausea And Vomiting    Has patient had a PCN reaction causing immediate rash, facial/tongue/throat swelling, SOB or lightheadedness with hypotension: Yes Has patient had a PCN reaction causing severe rash involving mucus membranes or skin necrosis: No Has patient had a  PCN reaction that required hospitalization No Has patient had a PCN reaction occurring within the last 10 years: No If all of the above answers are "NO", then may proceed with Cephalosporin use.     Physical exam:  Today's Vitals   02/22/22 1451  BP: 122/75  Pulse: 76  Weight: 218 lb (98.9 kg)  Height: 5' (1.524 m)   Body mass index is 42.58 kg/m.   Wt Readings from Last 3 Encounters:  02/22/22 218 lb (98.9 kg)  02/03/22 208 lb (94.3 kg)  01/25/22 207 lb 12.8 oz (94.3 kg)     Ht  Readings from Last 3 Encounters:  02/22/22 5' (1.524 m)  02/03/22 5' (1.524 m)  01/25/22 5' (1.524 m)      General: The patient is awake, alert and appears not in acute distress. The patient is well groomed. Head: Normocephalic, atraumatic.  Neck is supple. Mallampati 3,  neck circumference:17 inches .  Nasal airflow  patent.  Retrognathia is mildy seen.  Dental status: native Cardiovascular:  Regular rate and cardiac rhythm by pulse,  without distended neck veins. Respiratory: Lungs are clear to auscultation.  Skin:  Without evidence of ankle edema, or rash. Trunk: The patient's posture is erect.   Neurologic exam : The patient is awake and alert, oriented to place and time.   Memory subjective described as intact.  Attention span & concentration ability appears normal.  Speech is fluent,  without  dysarthria, dysphonia or aphasia.  Mood and affect are appropriate.   Cranial nerves: no loss of smell or taste reported  Pupils are equal and briskly reactive to light. Funduscopic exam deferred. Amblyopia , left eye blind. "Lazy eye".  Extraocular movements in vertical and horizontal planes were intact and without nystagmus. No Diplopia. Visual fields by finger perimetry are intact. Hearing was intact to soft voice and finger rubbing.    Facial sensation intact to fine touch.  Facial motor strength is symmetric and tongue and uvula move midline.  Neck ROM : rotation, tilt and flexion extension were normal for age and shoulder shrug was symmetrical.    Motor exam:  Symmetric bulk, tone and ROM.   Normal tone without cog wheeling, symmetric grip strength .   Sensory:  Fine touch, pinprick and vibration were tested  and  normal.  Proprioception tested in the upper extremities was normal.   Coordination: Rapid alternating movements in the fingers/hands were of normal speed.  The Finger-to-nose maneuver was intact without evidence of ataxia, dysmetria or tremor.   Gait and station:  Patient could rise unassisted from a seated position, walked without assistive device.  Stance is of normal width/ base and the patient turned with 3 steps.  Toe and heel walk were deferred.  Deep tendon reflexes: in the  upper and lower extremities are symmetric and intact.  Babinski response was deferred .     After spending a total time of  45  minutes face to face and additional time for physical and neurologic examination, review of laboratory studies,  personal review of imaging studies, reports and results of other testing and review of referral information / records as far as provided in visit, I have established the following assessments:  1) I have the pleasure of meeting [name] today, she has an interesting history of probably more than 1 sleep disorder.  In childhood she was hypersomniac and actually fell asleep very easily, she also was a sleep walker in her childhood years.  Later this  regular work requirements and days schedules there was not much of a sleep problem.  After she lost vision in her left eye in 1978 due to a stroke she was deemed disabled and a lot of the external timeframe and scheduling activities during the day were lost.  It was then that she slowly developed more difficulties to fall asleep, to rise, etc. she also began to take regular daytime naps.  2)She developed headaches, and these seem also to affect sleep and more than 1 way and may actually be related to a sleep disorder itself.  She has cluster headaches sharp stabbing headaches that feel as if something is poking her through the eye and these will wake her out of sleep sometimes in the early morning hours.  She also frequently wakes up with a headache in the morning which can have a very different character more dull more throbbing not sharp and stabbing.  3)She often has a dry mouth, she is known to snore loudly and her family has witnessed apneas.  She has woken herself by snoring. This last  sleep disorder is very likely obstructive sleep apnea and is also supported by the risk factors of a BMI of 42, a larger than average neck size of 17 inches and a high-grade Mallampati.  I would like to add that Mrs. Bango does not carry a diagnosis of diabetes that she had remarkably brisk reflexes. Could this relate to small vessel disease. ?   My Plan is to proceed with:  1)  attended sleep study-the sleep study is meant to identify sleep apnea, possible hypoxemia, and I also want to evaluate her for possible parasomnia, sleep talking, yelling,  kicking and boxing (REM BD ).   2)cyclic cyclic sleep disorder which can be mood related such as depression and anxiety promoted.  3) hyperreflexia   I would like to thank Mikayla Foy, NP and Vevelyn Francois, [address] [place],  [place] [address] for allowing me to meet with and to take care of this pleasant patient.   I I plan to follow up either personally or through our NP Frann Rider within 3 month.   CC: I will share my notes with PCP.  Electronically signed by: Larey Seat, MD 02/22/2022 3:29 PM  [facility] Board certified by The [employer] of Sleep Medicine and Diplomate of the [employer] of Sleep Medicine. Board certified In Neurology through the [place], Fellow of the [employer] of Neurology. Medical Director of [employer] [employer].

## 2022-02-24 ENCOUNTER — Other Ambulatory Visit: Payer: Self-pay | Admitting: Nurse Practitioner

## 2022-02-25 ENCOUNTER — Other Ambulatory Visit: Payer: Self-pay | Admitting: Nurse Practitioner

## 2022-02-27 ENCOUNTER — Other Ambulatory Visit: Payer: Self-pay

## 2022-02-27 MED ORDER — LEVOTHYROXINE SODIUM 75 MCG PO TABS: 75.0000 ug | ORAL_TABLET | Freq: Every day | ORAL | 0 refills | Status: DC

## 2022-02-28 ENCOUNTER — Telehealth: Payer: Self-pay

## 2022-02-28 ENCOUNTER — Other Ambulatory Visit: Payer: Self-pay | Admitting: Nurse Practitioner

## 2022-02-28 DIAGNOSIS — G479 Sleep disorder, unspecified: Secondary | ICD-10-CM

## 2022-02-28 NOTE — Telephone Encounter (Signed)
Paperwork in in your box to be filled out.

## 2022-02-28 NOTE — Telephone Encounter (Signed)
[facility] home care called in regards to [place] form   [phone] please call once its faxed

## 2022-03-08 ENCOUNTER — Other Ambulatory Visit: Payer: Self-pay | Admitting: Nurse Practitioner

## 2022-03-08 DIAGNOSIS — E559 Vitamin D deficiency, unspecified: Secondary | ICD-10-CM

## 2022-03-15 ENCOUNTER — Telehealth: Payer: Self-pay | Admitting: Neurology

## 2022-03-15 NOTE — Telephone Encounter (Signed)
[facility] healthy blue pending uploaded notes on the portal  

## 2022-03-20 NOTE — Telephone Encounter (Signed)
NPSG- MCD healthy blue [name]: [id number] (exp. 03/15/22 to 05/14/22). Patient is scheduled at [facility] for 04/03/22 at 8 pm. I also mailed the packet information to the patient.

## 2022-03-22 ENCOUNTER — Telehealth: Payer: Self-pay | Admitting: Pulmonary Disease

## 2022-03-22 NOTE — Telephone Encounter (Signed)
-----   Message from Dawayne Cirri sent at 03/22/2022 11:45 AM EDT ----- Regarding: RE: In Lab Study Denied Per Dr. Ander Slade, Please place order for Home Sleep Study. Thanks:) ----- Message ----- From: Laurin Coder, MD Sent: 03/22/2022  11:36 AM EDT To: Dawayne Cirri Subject: RE: In Lab Study Denied                        Can we order a home sleep study please or forward to [facility] to place order please.    Jaynee Eagles. ----- Message ----- From: Dawayne Cirri Sent: 03/22/2022  11:33 AM EDT To: Laurin Coder, MD Subject: In Lab Study Denied                            In Lab study denied as [facility] prefers patient have a Home Sleep study done.

## 2022-03-22 NOTE — Telephone Encounter (Signed)
I called the patient to let her know that per insurance the patient would need a home sleep test.

## 2022-03-31 ENCOUNTER — Encounter: Payer: Self-pay | Admitting: Nurse Practitioner

## 2022-03-31 ENCOUNTER — Ambulatory Visit: Payer: Medicaid Other | Admitting: Nurse Practitioner

## 2022-03-31 DIAGNOSIS — E559 Vitamin D deficiency, unspecified: Secondary | ICD-10-CM | POA: Diagnosis not present

## 2022-03-31 MED ORDER — VITAMIN D (ERGOCALCIFEROL) 1.25 MG (50000 UNIT) PO CAPS: 50000.0000 [IU] | ORAL_CAPSULE | ORAL | 1 refills | Status: DC

## 2022-03-31 NOTE — Progress Notes (Unsigned)
$'@Patient'j$  ID: [name], female    DOB: 03-19-1956, 66 y.o.   MRN: [id number]  Chief Complaint  Patient presents with   Hospitalization Follow-up    Pt is here for hospital follow up. Pt is concerns about the 3 lesions on her kidney that they found during hospitalization     Referring provider: Fenton Foy, NP   HPI       Allergies  Allergen Reactions   Aspirin Other (See Comments)    Can take baby aspirin (heart flutters with high doses)   Ibuprofen [Ibuprofen] Nausea And Vomiting   Penicillins Nausea And Vomiting    Has patient had a PCN reaction causing immediate rash, facial/tongue/throat swelling, SOB or lightheadedness with hypotension: Yes Has patient had a PCN reaction causing severe rash involving mucus membranes or skin necrosis: No Has patient had a PCN reaction that required hospitalization No Has patient had a PCN reaction occurring within the last 10 years: No If all of the above answers are "NO", then may proceed with Cephalosporin use.     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 Swallow whole.   EMGALITY 120 MG/ML SOAJ INJECT 120 MG into THE SKIN EVERY 30 DAYS (Patient taking differently: Inject 120 mg into the skin every 30 (thirty) days.)   fenofibrate (TRICOR) 48 MG tablet Take 1 tablet (48 mg total) by mouth daily.   flecainide (TAMBOCOR) 50 MG tablet Take 1 tablet (50 mg total) by mouth 2 (two) times daily.   hydrocortisone (CORTEF) 20 MG tablet Take 0.5-1 tablets (10-20 mg total) by mouth 2 (two) times daily. Take 1 tablet every morning and 1/2 tablet every evening (Patient taking differently: Take 10-20 mg by mouth See admin instructions. 20 mg in the morning 10 mg in the evening)   hydrOXYzine (ATARAX) 10 MG tablet Take 1 tablet (10 mg total) by mouth at bedtime as needed.   levothyroxine (SYNTHROID) 75 MCG tablet Take 1 tablet (75 mcg total) by mouth daily.   levothyroxine (SYNTHROID) 75 MCG tablet Take 1 tablet (75 mcg total) by mouth daily.   methocarbamol (ROBAXIN) 500 MG tablet Take 1 tablet (500 mg  total) by mouth 4 (four) times daily. (Patient taking differently: Take 500 mg by mouth every 8 (eight) hours as needed for muscle spasms.)   metoprolol tartrate (LOPRESSOR) 25 MG tablet Take 1 tablet (25 mg total) by mouth 2 (two) times daily.   nitroGLYCERIN (NITROSTAT) 0.4 MG SL tablet Place 1 tablet (0.4 mg total) under the tongue every 5 (five) minutes x 3 doses as needed for chest pain.   omega-3 acid ethyl esters (LOVAZA) 1 g capsule Take 1 capsule (1 g total) by mouth 2 (two) times daily.   omeprazole (PRILOSEC) 20 MG capsule Take 20 mg by mouth 2 (two) times daily.   Rimegepant Sulfate (NURTEC) 75 MG TBDP Take 75 mg by mouth daily as needed. For migraines. Take as close to onset of migraine as possible. One daily  maximum.   rosuvastatin (CRESTOR) 10 MG tablet Take 1 tablet (10 mg total) by mouth daily.   venlafaxine XR (EFFEXOR-XR) 150 MG 24 hr capsule Take 1 capsule (150 mg total) by mouth daily.   Vitamin D, Ergocalciferol, (DRISDOL) 1.25 MG (50000 UNIT) CAPS capsule Take 1 capsule (50,000 Units total) by mouth every 7 (seven) days.   No facility-administered encounter medications on file as of 03/31/2022.     Review of Systems  Review of Systems     Physical Exam  Wt 218 lb 9.6 oz (99.2 kg)   BMI 42.69 kg/m   Wt Readings from Last 5 Encounters:  03/31/22 218 lb 9.6 oz (99.2 kg)  02/22/22 218 lb (98.9 kg)  02/03/22 208 lb (94.3 kg)  01/25/22 207 lb 12.8 oz (94.3 kg)  01/04/22 215 lb 8 oz (97.8 kg)     Physical Exam   Lab Results:  CBC    Component Value Date/Time   WBC 7.3 01/26/2022 0840   WBC 6.8 12/25/2021 0119   RBC 5.01 01/26/2022 0840   RBC 4.24 12/25/2021 0119   HGB 14.9 01/26/2022 0840   HCT 45.5 01/26/2022 0840   PLT 341 01/26/2022 0840   MCV 91 01/26/2022 0840   MCH 29.7 01/26/2022 0840   MCH 29.5 12/25/2021 0119   MCHC 32.7 01/26/2022 0840   MCHC 32.9 12/25/2021 0119   RDW 14.1 01/26/2022 0840   LYMPHSABS 3.5 12/24/2021 0059   LYMPHSABS 2.7 11/25/2020 1350   MONOABS 1.0 12/24/2021 0059   EOSABS 0.5 12/24/2021 0059   EOSABS 0.4 11/25/2020 1350   BASOSABS 0.0 12/24/2021 0059   BASOSABS 0.0 11/25/2020 1350    BMET    Component Value Date/Time   NA 136 01/26/2022 0840   K 5.0 01/26/2022 0840   CL 100 01/26/2022 0840   CO2 18 (L) 01/26/2022 0840   GLUCOSE 213 (H) 01/26/2022 0840   GLUCOSE 160 (H) 12/25/2021 0119   BUN 20 01/26/2022 0840   CREATININE 1.09 (H) 01/26/2022 0840   CALCIUM 10.2 01/26/2022 0840   GFRNONAA >60 12/25/2021 0119   GFRAA 83 05/25/2020 1234    BNP No results found for: "BNP"  ProBNP No results found for: "PROBNP"  Imaging: No results found.   Assessment & Plan:   No problem-specific Assessment & Plan notes  found for this encounter.     Fenton Foy, NP 03/31/2022

## 2022-04-01 LAB — CBC
Hematocrit: 41.1 % (ref 34.0–46.6)
Hemoglobin: 13.5 g/dL (ref 11.1–15.9)
MCH: 29.5 pg (ref 26.6–33.0)
MCHC: 32.8 g/dL (ref 31.5–35.7)
MCV: 90 fL (ref 79–97)
Platelets: 290 10*3/uL (ref 150–450)
RBC: 4.57 x10E6/uL (ref 3.77–5.28)
RDW: 13.5 % (ref 11.7–15.4)
WBC: 8.2 10*3/uL (ref 3.4–10.8)

## 2022-04-01 LAB — COMPREHENSIVE METABOLIC PANEL
ALT: 12 IU/L (ref 0–32)
AST: 16 IU/L (ref 0–40)
Albumin/Globulin Ratio: 2.8 — ABNORMAL HIGH (ref 1.2–2.2)
Albumin: 4.8 g/dL (ref 3.9–4.9)
Alkaline Phosphatase: 82 IU/L (ref 44–121)
BUN/Creatinine Ratio: 20 (ref 12–28)
BUN: 17 mg/dL (ref 8–27)
Bilirubin Total: <0.2 mg/dL (ref 0.0–1.2)
CO2: 18 mmol/L — ABNORMAL LOW (ref 20–29)
Calcium: 9.7 mg/dL (ref 8.7–10.3)
Chloride: 105 mmol/L (ref 96–106)
Creatinine, Ser: 0.83 mg/dL (ref 0.57–1.00)
Globulin, Total: 1.7 g/dL (ref 1.5–4.5)
Glucose: 147 mg/dL — ABNORMAL HIGH (ref 70–99)
Potassium: 4.3 mmol/L (ref 3.5–5.2)
Sodium: 143 mmol/L (ref 134–144)
Total Protein: 6.5 g/dL (ref 6.0–8.5)
eGFR: 79 mL/min/{1.73_m2} (ref >59)

## 2022-04-01 LAB — VITAMIN D 25 HYDROXY (VIT D DEFICIENCY, FRACTURES): Vit D, 25-Hydroxy: 39.1 ng/mL (ref 30.0–100.0)

## 2022-04-03 ENCOUNTER — Encounter: Payer: Self-pay | Admitting: Nurse Practitioner

## 2022-04-03 ENCOUNTER — Telehealth: Payer: Self-pay | Admitting: Nurse Practitioner

## 2022-04-03 DIAGNOSIS — E559 Vitamin D deficiency, unspecified: Secondary | ICD-10-CM | POA: Insufficient documentation

## 2022-04-03 NOTE — Telephone Encounter (Signed)
[place] pre-service center requesting authorization for MRI scheduled on 7/27. Call [place] @ [place]: [phone] Ext [phone]

## 2022-04-03 NOTE — Assessment & Plan Note (Signed)
-   Vitamin D, Ergocalciferol, (DRISDOL) 1.25 MG (50000 UNIT) CAPS capsule; Take 1 capsule (50,000 Units total) by mouth every 7 (seven) days.  Dispense: 5 capsule; Refill: 1 - Vitamin D, 25-hydroxy - CBC - Comprehensive metabolic panel  Follow up:   Follow up in 3 months or sooner if needed

## 2022-04-03 NOTE — Patient Instructions (Addendum)
1. Vitamin D deficiency  - Vitamin D, Ergocalciferol, (DRISDOL) 1.25 MG (50000 UNIT) CAPS capsule; Take 1 capsule (50,000 Units total) by mouth every 7 (seven) days.  Dispense: 5 capsule; Refill: 1 - Vitamin D, 25-hydroxy - CBC - Comprehensive metabolic panel  Follow up:   Follow up in 3 months or sooner if needed

## 2022-04-04 NOTE — Telephone Encounter (Signed)
Patient no showed her sleep study appointment from 04/03/22.

## 2022-04-06 ENCOUNTER — Ambulatory Visit (HOSPITAL_COMMUNITY): Admission: RE | Admit: 2022-04-06 | Payer: Medicaid Other | Source: Ambulatory Visit

## 2022-04-07 ENCOUNTER — Telehealth: Payer: Self-pay | Admitting: Nurse Practitioner

## 2022-04-07 ENCOUNTER — Ambulatory Visit (HOSPITAL_COMMUNITY): Admission: RE | Admit: 2022-04-07 | Payer: Medicaid Other | Source: Ambulatory Visit

## 2022-04-07 NOTE — Telephone Encounter (Signed)
[facility] @ [place] [facility] called about a Medicaid authorization for patient's MRI. Please contact Medicaid for [place] and return Brandy's call.   [phone]

## 2022-04-11 DIAGNOSIS — I10 Essential (primary) hypertension: Secondary | ICD-10-CM | POA: Diagnosis not present

## 2022-04-12 DIAGNOSIS — I10 Essential (primary) hypertension: Secondary | ICD-10-CM | POA: Diagnosis not present

## 2022-04-13 DIAGNOSIS — I10 Essential (primary) hypertension: Secondary | ICD-10-CM | POA: Diagnosis not present

## 2022-04-14 DIAGNOSIS — I10 Essential (primary) hypertension: Secondary | ICD-10-CM | POA: Diagnosis not present

## 2022-04-17 DIAGNOSIS — I10 Essential (primary) hypertension: Secondary | ICD-10-CM | POA: Diagnosis not present

## 2022-04-18 DIAGNOSIS — I10 Essential (primary) hypertension: Secondary | ICD-10-CM | POA: Diagnosis not present

## 2022-04-19 DIAGNOSIS — I10 Essential (primary) hypertension: Secondary | ICD-10-CM | POA: Diagnosis not present

## 2022-04-20 DIAGNOSIS — I10 Essential (primary) hypertension: Secondary | ICD-10-CM | POA: Diagnosis not present

## 2022-04-21 DIAGNOSIS — I10 Essential (primary) hypertension: Secondary | ICD-10-CM | POA: Diagnosis not present

## 2022-04-24 DIAGNOSIS — I10 Essential (primary) hypertension: Secondary | ICD-10-CM | POA: Diagnosis not present

## 2022-04-25 DIAGNOSIS — I10 Essential (primary) hypertension: Secondary | ICD-10-CM | POA: Diagnosis not present

## 2022-04-26 DIAGNOSIS — I10 Essential (primary) hypertension: Secondary | ICD-10-CM | POA: Diagnosis not present

## 2022-04-27 DIAGNOSIS — I10 Essential (primary) hypertension: Secondary | ICD-10-CM | POA: Diagnosis not present

## 2022-04-28 DIAGNOSIS — I10 Essential (primary) hypertension: Secondary | ICD-10-CM | POA: Diagnosis not present

## 2022-05-01 DIAGNOSIS — I10 Essential (primary) hypertension: Secondary | ICD-10-CM | POA: Diagnosis not present

## 2022-05-02 DIAGNOSIS — I10 Essential (primary) hypertension: Secondary | ICD-10-CM | POA: Diagnosis not present

## 2022-05-03 DIAGNOSIS — I10 Essential (primary) hypertension: Secondary | ICD-10-CM | POA: Diagnosis not present

## 2022-05-04 ENCOUNTER — Other Ambulatory Visit: Payer: Self-pay | Admitting: Nurse Practitioner

## 2022-05-04 DIAGNOSIS — I10 Essential (primary) hypertension: Secondary | ICD-10-CM | POA: Diagnosis not present

## 2022-05-04 DIAGNOSIS — E559 Vitamin D deficiency, unspecified: Secondary | ICD-10-CM

## 2022-05-05 DIAGNOSIS — I10 Essential (primary) hypertension: Secondary | ICD-10-CM | POA: Diagnosis not present

## 2022-05-08 DIAGNOSIS — I10 Essential (primary) hypertension: Secondary | ICD-10-CM | POA: Diagnosis not present

## 2022-05-09 DIAGNOSIS — I10 Essential (primary) hypertension: Secondary | ICD-10-CM | POA: Diagnosis not present

## 2022-05-10 DIAGNOSIS — I10 Essential (primary) hypertension: Secondary | ICD-10-CM | POA: Diagnosis not present

## 2022-05-11 ENCOUNTER — Ambulatory Visit: Payer: Medicaid Other | Admitting: Nurse Practitioner

## 2022-05-11 DIAGNOSIS — I10 Essential (primary) hypertension: Secondary | ICD-10-CM | POA: Diagnosis not present

## 2022-05-12 DIAGNOSIS — I10 Essential (primary) hypertension: Secondary | ICD-10-CM | POA: Diagnosis not present

## 2022-05-15 DIAGNOSIS — I10 Essential (primary) hypertension: Secondary | ICD-10-CM | POA: Diagnosis not present

## 2022-05-16 DIAGNOSIS — I10 Essential (primary) hypertension: Secondary | ICD-10-CM | POA: Diagnosis not present

## 2022-05-17 DIAGNOSIS — I10 Essential (primary) hypertension: Secondary | ICD-10-CM | POA: Diagnosis not present

## 2022-05-18 DIAGNOSIS — I10 Essential (primary) hypertension: Secondary | ICD-10-CM | POA: Diagnosis not present

## 2022-05-19 DIAGNOSIS — I10 Essential (primary) hypertension: Secondary | ICD-10-CM | POA: Diagnosis not present

## 2022-05-22 DIAGNOSIS — I10 Essential (primary) hypertension: Secondary | ICD-10-CM | POA: Diagnosis not present

## 2022-05-23 DIAGNOSIS — I10 Essential (primary) hypertension: Secondary | ICD-10-CM | POA: Diagnosis not present

## 2022-05-24 ENCOUNTER — Other Ambulatory Visit: Payer: Self-pay | Admitting: Internal Medicine

## 2022-05-24 DIAGNOSIS — I10 Essential (primary) hypertension: Secondary | ICD-10-CM | POA: Diagnosis not present

## 2022-05-24 DIAGNOSIS — E781 Pure hyperglyceridemia: Secondary | ICD-10-CM

## 2022-05-30 DIAGNOSIS — I10 Essential (primary) hypertension: Secondary | ICD-10-CM | POA: Diagnosis not present

## 2022-05-31 DIAGNOSIS — I10 Essential (primary) hypertension: Secondary | ICD-10-CM | POA: Diagnosis not present

## 2022-06-01 ENCOUNTER — Other Ambulatory Visit: Payer: Self-pay | Admitting: Nurse Practitioner

## 2022-06-01 DIAGNOSIS — I10 Essential (primary) hypertension: Secondary | ICD-10-CM | POA: Diagnosis not present

## 2022-06-01 DIAGNOSIS — E781 Pure hyperglyceridemia: Secondary | ICD-10-CM

## 2022-06-02 DIAGNOSIS — I10 Essential (primary) hypertension: Secondary | ICD-10-CM | POA: Diagnosis not present

## 2022-06-05 DIAGNOSIS — I10 Essential (primary) hypertension: Secondary | ICD-10-CM | POA: Diagnosis not present

## 2022-06-06 DIAGNOSIS — I10 Essential (primary) hypertension: Secondary | ICD-10-CM | POA: Diagnosis not present

## 2022-06-07 DIAGNOSIS — I10 Essential (primary) hypertension: Secondary | ICD-10-CM | POA: Diagnosis not present

## 2022-06-08 DIAGNOSIS — I10 Essential (primary) hypertension: Secondary | ICD-10-CM | POA: Diagnosis not present

## 2022-06-09 DIAGNOSIS — I10 Essential (primary) hypertension: Secondary | ICD-10-CM | POA: Diagnosis not present

## 2022-06-12 DIAGNOSIS — I10 Essential (primary) hypertension: Secondary | ICD-10-CM | POA: Diagnosis not present

## 2022-06-13 DIAGNOSIS — I10 Essential (primary) hypertension: Secondary | ICD-10-CM | POA: Diagnosis not present

## 2022-06-14 ENCOUNTER — Other Ambulatory Visit: Payer: Self-pay | Admitting: Nurse Practitioner

## 2022-06-14 DIAGNOSIS — I10 Essential (primary) hypertension: Secondary | ICD-10-CM | POA: Diagnosis not present

## 2022-06-15 ENCOUNTER — Telehealth: Payer: Self-pay

## 2022-06-15 DIAGNOSIS — E559 Vitamin D deficiency, unspecified: Secondary | ICD-10-CM

## 2022-06-15 DIAGNOSIS — I10 Essential (primary) hypertension: Secondary | ICD-10-CM | POA: Diagnosis not present

## 2022-06-15 MED ORDER — VITAMIN D (ERGOCALCIFEROL) 1.25 MG (50000 UNIT) PO CAPS: 50000.0000 [IU] | ORAL_CAPSULE | ORAL | 1 refills | Status: DC

## 2022-06-15 NOTE — Telephone Encounter (Signed)
No additional notes needed  

## 2022-06-16 DIAGNOSIS — I10 Essential (primary) hypertension: Secondary | ICD-10-CM | POA: Diagnosis not present

## 2022-06-19 DIAGNOSIS — I10 Essential (primary) hypertension: Secondary | ICD-10-CM | POA: Diagnosis not present

## 2022-06-20 DIAGNOSIS — I10 Essential (primary) hypertension: Secondary | ICD-10-CM | POA: Diagnosis not present

## 2022-06-21 DIAGNOSIS — I10 Essential (primary) hypertension: Secondary | ICD-10-CM | POA: Diagnosis not present

## 2022-06-22 DIAGNOSIS — I10 Essential (primary) hypertension: Secondary | ICD-10-CM | POA: Diagnosis not present

## 2022-06-23 DIAGNOSIS — I10 Essential (primary) hypertension: Secondary | ICD-10-CM | POA: Diagnosis not present

## 2022-06-26 DIAGNOSIS — I10 Essential (primary) hypertension: Secondary | ICD-10-CM | POA: Diagnosis not present

## 2022-06-27 DIAGNOSIS — I10 Essential (primary) hypertension: Secondary | ICD-10-CM | POA: Diagnosis not present

## 2022-06-28 DIAGNOSIS — I10 Essential (primary) hypertension: Secondary | ICD-10-CM | POA: Diagnosis not present

## 2022-06-29 DIAGNOSIS — I10 Essential (primary) hypertension: Secondary | ICD-10-CM | POA: Diagnosis not present

## 2022-06-30 DIAGNOSIS — I10 Essential (primary) hypertension: Secondary | ICD-10-CM | POA: Diagnosis not present

## 2022-07-03 ENCOUNTER — Ambulatory Visit: Payer: Medicaid Other | Admitting: Nurse Practitioner

## 2022-07-03 DIAGNOSIS — I10 Essential (primary) hypertension: Secondary | ICD-10-CM | POA: Diagnosis not present

## 2022-07-05 DIAGNOSIS — I10 Essential (primary) hypertension: Secondary | ICD-10-CM | POA: Diagnosis not present

## 2022-07-06 DIAGNOSIS — I10 Essential (primary) hypertension: Secondary | ICD-10-CM | POA: Diagnosis not present

## 2022-07-07 DIAGNOSIS — I10 Essential (primary) hypertension: Secondary | ICD-10-CM | POA: Diagnosis not present

## 2022-07-10 DIAGNOSIS — I10 Essential (primary) hypertension: Secondary | ICD-10-CM | POA: Diagnosis not present

## 2022-07-11 DIAGNOSIS — I10 Essential (primary) hypertension: Secondary | ICD-10-CM | POA: Diagnosis not present

## 2022-07-12 DIAGNOSIS — I10 Essential (primary) hypertension: Secondary | ICD-10-CM | POA: Diagnosis not present

## 2022-07-13 ENCOUNTER — Encounter: Payer: Self-pay | Admitting: Nurse Practitioner

## 2022-07-13 ENCOUNTER — Ambulatory Visit (INDEPENDENT_AMBULATORY_CARE_PROVIDER_SITE_OTHER): Payer: Medicaid Other | Admitting: Nurse Practitioner

## 2022-07-13 VITALS — BP 119/62 | HR 82 | Temp 98.9°F | Ht 60.0 in | Wt 230.0 lb

## 2022-07-13 DIAGNOSIS — M5441 Lumbago with sciatica, right side: Secondary | ICD-10-CM

## 2022-07-13 DIAGNOSIS — G8929 Other chronic pain: Secondary | ICD-10-CM

## 2022-07-13 DIAGNOSIS — E039 Hypothyroidism, unspecified: Secondary | ICD-10-CM | POA: Diagnosis not present

## 2022-07-13 DIAGNOSIS — I10 Essential (primary) hypertension: Secondary | ICD-10-CM | POA: Diagnosis not present

## 2022-07-13 DIAGNOSIS — Z23 Encounter for immunization: Secondary | ICD-10-CM

## 2022-07-13 MED ORDER — TIZANIDINE HCL 4 MG PO TABS: 4.0000 mg | ORAL_TABLET | Freq: Every day | ORAL | 0 refills | Status: AC

## 2022-07-13 NOTE — Assessment & Plan Note (Signed)
-   DG Lumbar Spine Complete - Ambulatory referral to Physical Therapy - tiZANidine (ZANAFLEX) 4 MG tablet; Take 1 tablet (4 mg total) by mouth daily for 5 days.  Dispense: 5 tablet; Refill: 0  2. Flu vaccine need  - Flu Vaccine QUAD 40moIM (Fluarix, Fluzone & Alfiuria Quad PF)  3. Hypothyroidism, unspecified type  - CBC - Comprehensive metabolic panel  Follow up:  Follow up in 6 months or sooner

## 2022-07-13 NOTE — Progress Notes (Signed)
$'@Patient'Z$  ID: [name], female    DOB: [birth year], 66 y.o.   MRN: [id number]  Chief Complaint  Patient presents with   Follow-up    Vitamin d     Referring provider: Fenton Foy, NP   HPI  [name] presents for follow up. 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    Patient presents today for follow-up on chronic conditions.  Overall she states that she has been doing well other than chronic low back pain.  She states this has been an issue for several years.  She states that it does seem to be slowly worsening.  We will order x-ray and physical therapy.  We will trial tizanidine.  Patient is due for follow-up blood work today. Denies f/c/s, n/v/d, hemoptysis, PND, leg swelling Denies chest pain or edema         Allergies  Allergen Reactions   Aspirin Other (See Comments)    Can take baby aspirin (heart flutters with high doses)   Ibuprofen [Ibuprofen] Nausea And Vomiting   Penicillins Nausea And Vomiting    Has patient had a PCN reaction causing immediate rash, facial/tongue/throat swelling, SOB or lightheadedness with hypotension: Yes Has patient had a PCN reaction causing severe rash involving mucus membranes or skin necrosis: No Has patient had a PCN reaction that required hospitalization No Has patient had a PCN reaction occurring within the last 10 years: No If all of the above answers are "NO", then may proceed with Cephalosporin use.     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 Swallow whole.   EMGALITY 120 MG/ML SOAJ INJECT 120 MG into THE SKIN EVERY 30 DAYS   fenofibrate (TRICOR) 48 MG tablet Take 1 tablet (48 mg total) by mouth daily.   flecainide (TAMBOCOR) 50 MG tablet Take 1 tablet (50 mg  total) by mouth 2 (two) times daily.   hydrocortisone (CORTEF) 20 MG tablet Take 0.5-1 tablets (10-20 mg total) by mouth 2 (two) times daily. Take 1 tablet every morning and 1/2 tablet every evening (Patient taking differently: Take 10-20 mg by mouth See admin instructions. 20 mg in the morning 10 mg in the evening)   hydrOXYzine (ATARAX) 10 MG tablet Take 1 tablet (10 mg total) by mouth at bedtime as needed.   levothyroxine (SYNTHROID) 75 MCG tablet Take 1 tablet (75 mcg total) by mouth daily.   methocarbamol (ROBAXIN) 500 MG tablet Take 1 tablet (500 mg total) by mouth 4 (four) times daily.  (Patient taking differently: Take 500 mg by mouth every 8 (eight) hours as needed for muscle spasms.)   metoprolol tartrate (LOPRESSOR) 25 MG tablet Take 1 tablet (25 mg total) by mouth 2 (two) times daily.   omega-3 acid ethyl esters (LOVAZA) 1 g capsule Take 1 capsule (1 g total) by mouth 2 (two) times daily.   omeprazole (PRILOSEC) 20 MG capsule Take 20 mg by mouth 2 (two) times daily.   Rimegepant Sulfate (NURTEC) 75 MG TBDP Take 75 mg by mouth daily as needed. For migraines. Take as close to onset of migraine as possible. One daily maximum.   rosuvastatin (CRESTOR) 10 MG tablet Take 1 tablet (10 mg total) by mouth daily.   tiZANidine (ZANAFLEX) 4 MG tablet Take 1 tablet (4 mg total) by mouth daily for 5 days.   venlafaxine XR (EFFEXOR-XR) 150 MG 24 hr capsule Take 1 capsule (150 mg total) by mouth daily.   nitroGLYCERIN (NITROSTAT) 0.4 MG SL tablet Place 1 tablet (0.4 mg total) under the tongue every 5 (five) minutes x 3 doses as needed for chest pain. (Patient not taking: Reported on 07/13/2022)   Vitamin D, Ergocalciferol, (DRISDOL) 1.25 MG (50000 UNIT) CAPS capsule Take 1 capsule (50,000 Units total) by mouth every 7 (seven) days.   [DISCONTINUED] levothyroxine (SYNTHROID) 75 MCG tablet Take 1 tablet (75 mcg total) by mouth daily.   No facility-administered encounter medications on file as of 07/13/2022.     Review of Systems  Review of Systems  Constitutional: Negative.   HENT: Negative.    Cardiovascular: Negative.   Gastrointestinal: Negative.   Musculoskeletal:  Positive for back pain.  Allergic/Immunologic: Negative.   Neurological: Negative.   Psychiatric/Behavioral: Negative.         Physical Exam  BP 119/62   Pulse 82   Temp 98.9 F (37.2 C)   Ht 5' (1.524 m)   Wt 230 lb (104.3 kg)   SpO2 97%   BMI 44.92 kg/m   Wt Readings from Last 5 Encounters:  07/13/22 230 lb (104.3 kg)  03/31/22 218 lb 9.6 oz (99.2 kg)  02/22/22 218 lb (98.9 kg)  02/03/22 208 lb (94.3  kg)  01/25/22 207 lb 12.8 oz (94.3 kg)     Physical Exam Vitals and nursing note reviewed.  Constitutional:      General: She is not in acute distress.    Appearance: She is well-developed.  Cardiovascular:     Rate and Rhythm: Normal rate and regular rhythm.  Pulmonary:     Effort: Pulmonary effort is normal.     Breath sounds: Normal breath sounds.  Neurological:     Mental Status: She is alert and oriented to person, place, and time.      Lab Results:  CBC    Component Value Date/Time   WBC 8.2 03/31/2022 1159  WBC 6.8 12/25/2021 0119   RBC 4.57 03/31/2022 1159   RBC 4.24 12/25/2021 0119   HGB 13.5 03/31/2022 1159   HCT 41.1 03/31/2022 1159   PLT 290 03/31/2022 1159   MCV 90 03/31/2022 1159   MCH 29.5 03/31/2022 1159   MCH 29.5 12/25/2021 0119   MCHC 32.8 03/31/2022 1159   MCHC 32.9 12/25/2021 0119   RDW 13.5 03/31/2022 1159   LYMPHSABS 3.5 12/24/2021 0059   LYMPHSABS 2.7 11/25/2020 1350   MONOABS 1.0 12/24/2021 0059   EOSABS 0.5 12/24/2021 0059   EOSABS 0.4 11/25/2020 1350   BASOSABS 0.0 12/24/2021 0059   BASOSABS 0.0 11/25/2020 1350    BMET    Component Value Date/Time   NA 143 03/31/2022 1159   K 4.3 03/31/2022 1159   CL 105 03/31/2022 1159   CO2 18 (L) 03/31/2022 1159   GLUCOSE 147 (H) 03/31/2022 1159   GLUCOSE 160 (H) 12/25/2021 0119   BUN 17 03/31/2022 1159   CREATININE 0.83 03/31/2022 1159   CALCIUM 9.7 03/31/2022 1159   GFRNONAA >60 12/25/2021 0119   GFRAA 83 05/25/2020 1234     Assessment & Plan:   Chronic bilateral low back pain with right-sided sciatica - DG Lumbar Spine Complete - Ambulatory referral to Physical Therapy - tiZANidine (ZANAFLEX) 4 MG tablet; Take 1 tablet (4 mg total) by mouth daily for 5 days.  Dispense: 5 tablet; Refill: 0  2. Flu vaccine need  - Flu Vaccine QUAD 37moIM (Fluarix, Fluzone & Alfiuria Quad PF)  3. Hypothyroidism, unspecified type  - CBC - Comprehensive metabolic panel  Follow up:  Follow  up in 6 months or sooner      TFenton Foy NP 07/13/2022

## 2022-07-13 NOTE — Patient Instructions (Addendum)
1. Chronic bilateral low back pain with right-sided sciatica  - DG Lumbar Spine Complete - Ambulatory referral to Physical Therapy - tiZANidine (ZANAFLEX) 4 MG tablet; Take 1 tablet (4 mg total) by mouth daily for 5 days.  Dispense: 5 tablet; Refill: 0  2. Flu vaccine need  - Flu Vaccine QUAD 76moIM (Fluarix, Fluzone & Alfiuria Quad PF)  3. Hypothyroidism, unspecified type  - CBC - Comprehensive metabolic panel  Follow up:  Follow up in 6 months or sooner

## 2022-07-14 DIAGNOSIS — I10 Essential (primary) hypertension: Secondary | ICD-10-CM | POA: Diagnosis not present

## 2022-07-14 LAB — CBC
Hematocrit: 41.4 % (ref 34.0–46.6)
Hemoglobin: 13.4 g/dL (ref 11.1–15.9)
MCH: 29.6 pg (ref 26.6–33.0)
MCHC: 32.4 g/dL (ref 31.5–35.7)
MCV: 92 fL (ref 79–97)
Platelets: 323 10*3/uL (ref 150–450)
RBC: 4.52 x10E6/uL (ref 3.77–5.28)
RDW: 13.3 % (ref 11.7–15.4)
WBC: 7.4 10*3/uL (ref 3.4–10.8)

## 2022-07-14 LAB — COMPREHENSIVE METABOLIC PANEL
ALT: 14 IU/L (ref 0–32)
AST: 17 IU/L (ref 0–40)
Albumin/Globulin Ratio: 1.4 (ref 1.2–2.2)
Albumin: 4.1 g/dL (ref 3.9–4.9)
Alkaline Phosphatase: 76 IU/L (ref 44–121)
BUN/Creatinine Ratio: 15 (ref 12–28)
BUN: 15 mg/dL (ref 8–27)
Bilirubin Total: <0.2 mg/dL (ref 0.0–1.2)
CO2: 22 mmol/L (ref 20–29)
Calcium: 9.7 mg/dL (ref 8.7–10.3)
Chloride: 101 mmol/L (ref 96–106)
Creatinine, Ser: 1.01 mg/dL — ABNORMAL HIGH (ref 0.57–1.00)
Globulin, Total: 2.9 g/dL (ref 1.5–4.5)
Glucose: 182 mg/dL — ABNORMAL HIGH (ref 70–99)
Potassium: 4.2 mmol/L (ref 3.5–5.2)
Sodium: 137 mmol/L (ref 134–144)
Total Protein: 7 g/dL (ref 6.0–8.5)
eGFR: 63 mL/min/{1.73_m2} (ref >59)

## 2022-07-17 ENCOUNTER — Other Ambulatory Visit: Payer: Self-pay | Admitting: Nurse Practitioner

## 2022-07-17 DIAGNOSIS — E559 Vitamin D deficiency, unspecified: Secondary | ICD-10-CM

## 2022-07-17 DIAGNOSIS — I10 Essential (primary) hypertension: Secondary | ICD-10-CM | POA: Diagnosis not present

## 2022-07-18 DIAGNOSIS — I10 Essential (primary) hypertension: Secondary | ICD-10-CM | POA: Diagnosis not present

## 2022-07-19 ENCOUNTER — Ambulatory Visit: Payer: Medicaid Other | Attending: Physical Therapy | Admitting: Physical Therapy

## 2022-07-19 DIAGNOSIS — I10 Essential (primary) hypertension: Secondary | ICD-10-CM | POA: Diagnosis not present

## 2022-07-20 ENCOUNTER — Encounter: Payer: Self-pay | Admitting: Internal Medicine

## 2022-07-20 ENCOUNTER — Ambulatory Visit (INDEPENDENT_AMBULATORY_CARE_PROVIDER_SITE_OTHER): Payer: Medicaid Other | Admitting: Internal Medicine

## 2022-07-20 VITALS — BP 110/68 | HR 72 | Ht 60.0 in | Wt 230.0 lb

## 2022-07-20 DIAGNOSIS — E2740 Unspecified adrenocortical insufficiency: Secondary | ICD-10-CM | POA: Diagnosis not present

## 2022-07-20 DIAGNOSIS — E896 Postprocedural adrenocortical (-medullary) hypofunction: Secondary | ICD-10-CM | POA: Diagnosis not present

## 2022-07-20 DIAGNOSIS — I10 Essential (primary) hypertension: Secondary | ICD-10-CM | POA: Diagnosis not present

## 2022-07-20 LAB — CORTISOL: Cortisol, Plasma: 10.9 ug/dL

## 2022-07-20 MED ORDER — HYDROCORTISONE 10 MG PO TABS: 10.0000 mg | ORAL_TABLET | ORAL | 3 refills | Status: DC

## 2022-07-20 NOTE — Progress Notes (Signed)
Name: [name]  MRN/ DOB: [id number], 1956-07-07    Age/ Sex: 66 y.o., female    PCP: Fenton Foy, NP   Reason for Endocrinology Evaluation: Adrenal Insufficiency     Date of Initial Endocrinology Evaluation: 07/20/2022     HPI: [name] is a 66 y.o. female with a past medical history of Hx of atrial myxoma, Hx of cushing syndrome (S/P adrenalectomy). The patient presented for initial endocrinology clinic visit on 07/20/2022 for consultative assistance with her adrenal insufficiency.   Patient has been referred here for further evaluation of adrenal insufficiency, she had a low serum cortisol at <0.4 ug/dL on 12/24/2021  She is accompanied by her son and granddaughter  The patient is s/p bilateral adrenalectomy in 1982 due to [place] syndrome (records not available) Per patient she has been on hydrocortisone replacement therapy since then  She is on HC 20 mg takes 1 tablet every morning and half in the evening She has insomnia   She has occasional dizziness  Has noted weight gain  Has chronic constipation  She has migraine headaches    Left eye blind  She has no medical alert bracelet She does not follow sick day will for hydrocortisone  Grandfather with cushing syndrome  Brother with Acromegaly     HISTORY:  Past Medical History:  Past Medical History:  Diagnosis Date   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[place])    Allergy    Amblyopia    Anemia    Arthritis    "in my lower back"   Asthma    Atrial myxoma    Blindness of left eye 1978   Blood transfusion    Breast cyst    Constipation    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[place])    CVA (cerebral infarction)    Depression    Depression    GERD (gastroesophageal reflux disease)    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[place])    Hemorrhoids    History of open heart surgery 1977   Hyperlipemia    Hypothyroidism    per office note dated 03/25/2014   Migraine    Peptic ulcer associated with Helicobacter pylori infection     Reflux    Stroke ([employer]) 1978   "piece tumor broke off & went to left eye; leaving me blind"   Past Surgical History:  Past Surgical History:  Procedure Laterality Date   ADRENAL GLAND SURGERY  09/11/1980   on hydrocortisone   ATRIAL MYXOMA EXCISION  09/11/1976   BREAST BIOPSY  07/13/1999   ductal tissue excision & bx right; bx only left breast   COLONOSCOPY     EYE SURGERY     LEFT HEART CATHETERIZATION WITH CORONARY ANGIOGRAM N/A 10/06/2011   Procedure: LEFT HEART CATHETERIZATION WITH CORONARY ANGIOGRAM;  Surgeon: Clent Demark, MD;  Location: [facility];  Service: Cardiovascular;  Laterality: N/A;   ORBITOTOMY Right 07/27/2021   POLYPECTOMY     repaired cross eyed     "as a child"   ROTATOR CUFF REPAIR  05/12/2005   left   VAGINAL HYSTERECTOMY  09/11/1998   "partial; w/left ovary"    Social History:  reports that she has never smoked. She has never used smokeless tobacco. She reports that she does not drink alcohol and does not use drugs. Family History: family history includes Acromegaly in her father; Colon cancer (age of onset: 55) in her sister; Diabetes in her sister; Heart disease in her daughter, father, and sister; Kidney disease in her sister;  Stomach cancer in her father.   HOME MEDICATIONS: Allergies as of 07/20/2022       Reactions   Aspirin Other (See Comments)   Can take baby aspirin (heart flutters with high doses)   Ibuprofen [ibuprofen] Nausea And Vomiting   Penicillins Nausea And Vomiting   Has patient had a PCN reaction causing immediate rash, facial/tongue/throat swelling, SOB or lightheadedness with hypotension: Yes Has patient had a PCN reaction causing severe rash involving mucus membranes or skin necrosis: No Has patient had a PCN reaction that required hospitalization No Has patient had a PCN reaction occurring within the last 10 years: No If all of the above answers are "NO", then may proceed with Cephalosporin use.        Medication List         Accurate as of July 20, 2022 12:39 PM. If you have any questions, ask your nurse or doctor.          aspirin EC 81 MG tablet Take 81 mg by mouth daily. Swallow whole.   Emgality 120 MG/ML Soaj Generic drug: Galcanezumab-gnlm INJECT 120 MG into THE SKIN EVERY 30 DAYS   fenofibrate 48 MG tablet Commonly known as: TRICOR Take 1 tablet (48 mg total) by mouth daily.   flecainide 50 MG tablet Commonly known as: TAMBOCOR Take 1 tablet (50 mg total) by mouth 2 (two) times daily.   hydrocortisone 20 MG tablet Commonly known as: CORTEF Take 0.5-1 tablets (10-20 mg total) by mouth 2 (two) times daily. Take 1 tablet every morning and 1/2 tablet every evening What changed:  when to take this additional instructions   hydrOXYzine 10 MG tablet Commonly known as: ATARAX Take 1 tablet (10 mg total) by mouth at bedtime as needed.   levothyroxine 75 MCG tablet Commonly known as: SYNTHROID Take 1 tablet (75 mcg total) by mouth daily.   methocarbamol 500 MG tablet Commonly known as: Robaxin Take 1 tablet (500 mg total) by mouth 4 (four) times daily. What changed:  when to take this reasons to take this   metoprolol tartrate 25 MG tablet Commonly known as: LOPRESSOR Take 1 tablet (25 mg total) by mouth 2 (two) times daily.   nitroGLYCERIN 0.4 MG SL tablet Commonly known as: NITROSTAT Place 1 tablet (0.4 mg total) under the tongue every 5 (five) minutes x 3 doses as needed for chest pain.   Nurtec 75 MG Tbdp Generic drug: Rimegepant Sulfate Take 75 mg by mouth daily as needed. For migraines. Take as close to onset of migraine as possible. One daily maximum.   omega-3 acid ethyl esters 1 g capsule Commonly known as: LOVAZA Take 1 capsule (1 g total) by mouth 2 (two) times daily.   omeprazole 20 MG capsule Commonly known as: PRILOSEC Take 20 mg by mouth 2 (two) times daily.   rosuvastatin 10 MG tablet Commonly known as: CRESTOR Take 1 tablet (10 mg total) by mouth  daily.   venlafaxine XR 150 MG 24 hr capsule Commonly known as: EFFEXOR-XR Take 1 capsule (150 mg total) by mouth daily.   Vitamin D (Ergocalciferol) 1.25 MG (50000 UNIT) Caps capsule Commonly known as: DRISDOL Take 1 capsule (50,000 Units total) by mouth every 7 (seven) days.          REVIEW OF SYSTEMS: A comprehensive ROS was conducted with the patient and is negative except as per HPI    OBJECTIVE:  VS: BP 110/68 (BP Location: Left Arm, Patient Position: Sitting, Cuff Size: Large)  Pulse 72   Ht 5' (1.524 m)   Wt 230 lb (104.3 kg)   SpO2 95%   BMI 44.92 kg/m    Wt Readings from Last 3 Encounters:  07/20/22 230 lb (104.3 kg)  07/13/22 230 lb (104.3 kg)  03/31/22 218 lb 9.6 oz (99.2 kg)   Body surface area is 2.1 meters squared.   EXAM: General: Pt appears well and is in NAD  Eyes: External eye exam normal without stare, lid lag or exophthalmos.  EOM intact.  PERRL.  Neck: General: Supple without adenopathy. Thyroid: Thyroid size normal.  No goiter or nodules appreciated.   Lungs: Clear with good BS bilat with no rales, rhonchi, or wheezes  Heart: Auscultation: RRR.  Abdomen: Normoactive bowel sounds, soft, nontender, without masses or organomegaly palpable  Extremities:  BL LE: No pretibial edema normal ROM and strength.  Mental Status: Judgment, insight: Intact Orientation: Oriented to time, place, and person Mood and affect: No depression, anxiety, or agitation     DATA REVIEWED:  Latest Reference Range & Units 07/20/22 13:31  DHEA-SO4 9 - 118 mcg/dL 5 (L)  Cortisol, Plasma ug/dL 10.9     Old records , labs and images have been reviewed.   ASSESSMENT/PLAN/RECOMMENDATIONS:   Adrenal Insufficiency:  - She is S/P bilateral adrenalectomy due to cushing syndrome per pt  -She is on supraphysiologic hydrocortisone replacement -I will reduce hydrocortisone as below -Patient advised to obtain a medical alert bracelet -Sick day will discuss with the  patient -ACTH pending -We discussed pharmacokinetics of hydrocortisone as well as pathophysiology of normal adrenal gland and the effects of adrenalectomy on her body  Medications : Stop hydrocortisone 20 mg Start hydrocortisone 10 mg, 1.5 tabs with breakfast and 1 tablet between 2-4 PM  Follow-up in 4 months   I spent 45 minutes preparing to see the patient by review of recent labs, imaging and procedures, obtaining and reviewing separately obtained history, communicating with the patient/family or caregiver, ordering medications, tests or procedures, and documenting clinical information in the EHR including the differential Dx, treatment, and any further evaluation and other management    Signed electronically by: Mack Guise, MD  [facility] End[facility]  [facility] [address] [place], [place] [address] Phone: [phone] FAX: [phone]   CC: Fenton Foy, NP [facility] [address], [place] [address] Phone: [phone] Fax: [phone]   Return to Endocrinology clinic as below: Future Appointments  Date Time Provider [place]  09/28/2022 10:45 AM Evans Lance, MD [facility]  10/19/2022  3:15 PM Frann Rider, NP [facility] None  10/31/2022  9:20 AM Elouise Munroe, MD [facility] None  01/11/2023 10:00 AM Fenton Foy, NP [facility] None

## 2022-07-20 NOTE — Patient Instructions (Addendum)
STOP Hydrocortisone 20 mg tablets  Start  Hydrocortisone 10 mg , one and a half with Breakfast and 1 tablet between 2-4 pm    Please get a medical alert bracelet for Adrenal Insufficiency     ADRENAL INSUFFICIENCY SICK DAY RULES:  Should you face an extreme emotional or physical stress such as trauma, surgery or acute illness, this will require extra steroid coverage so that the body can meet that stress.   Without increasing the steroid dose you may experience severe weakness, headache, dizziness, nausea and vomiting and possibly a more serious deterioration in health.  Typically the dose of steroids will only need to be increased for a couple of days if you have an illness that is transient and managed in the community.   If you are unable to take/absorb an increased dose of steroids orally because of vomiting or diarrhea, you will urgently require steroid injections and should present to an Emergency Department.  The general advice for any serious illness is as follows: Double the normal daily steroid dose for up to 3 days if you have a temperature of more than 37.50C (99.30F) with signs of sickness, or severe emotional or physical distress Contact your primary care doctor and Endocrinologist if the illness worsens or it lasts for more than 3 days.  In cases of severe illness, urgent medical assistance should be promptly sought.

## 2022-07-21 DIAGNOSIS — I10 Essential (primary) hypertension: Secondary | ICD-10-CM | POA: Diagnosis not present

## 2022-07-21 DIAGNOSIS — E896 Postprocedural adrenocortical (-medullary) hypofunction: Secondary | ICD-10-CM | POA: Insufficient documentation

## 2022-07-24 DIAGNOSIS — I10 Essential (primary) hypertension: Secondary | ICD-10-CM | POA: Diagnosis not present

## 2022-07-24 LAB — ACTH: C206 ACTH: 6 pg/mL (ref 6–50)

## 2022-07-24 LAB — DHEA-SULFATE: DHEA-SO4: 5 ug/dL — ABNORMAL LOW (ref 9–118)

## 2022-07-25 ENCOUNTER — Other Ambulatory Visit: Payer: Self-pay | Admitting: Internal Medicine

## 2022-07-25 DIAGNOSIS — I10 Essential (primary) hypertension: Secondary | ICD-10-CM | POA: Diagnosis not present

## 2022-07-26 DIAGNOSIS — I10 Essential (primary) hypertension: Secondary | ICD-10-CM | POA: Diagnosis not present

## 2022-07-27 DIAGNOSIS — I10 Essential (primary) hypertension: Secondary | ICD-10-CM | POA: Diagnosis not present

## 2022-07-28 DIAGNOSIS — I10 Essential (primary) hypertension: Secondary | ICD-10-CM | POA: Diagnosis not present

## 2022-07-31 DIAGNOSIS — I10 Essential (primary) hypertension: Secondary | ICD-10-CM | POA: Diagnosis not present

## 2022-08-01 DIAGNOSIS — I10 Essential (primary) hypertension: Secondary | ICD-10-CM | POA: Diagnosis not present

## 2022-08-02 DIAGNOSIS — I10 Essential (primary) hypertension: Secondary | ICD-10-CM | POA: Diagnosis not present

## 2022-08-03 DIAGNOSIS — I10 Essential (primary) hypertension: Secondary | ICD-10-CM | POA: Diagnosis not present

## 2022-08-04 DIAGNOSIS — I10 Essential (primary) hypertension: Secondary | ICD-10-CM | POA: Diagnosis not present

## 2022-08-07 DIAGNOSIS — I10 Essential (primary) hypertension: Secondary | ICD-10-CM | POA: Diagnosis not present

## 2022-08-08 DIAGNOSIS — I10 Essential (primary) hypertension: Secondary | ICD-10-CM | POA: Diagnosis not present

## 2022-08-09 DIAGNOSIS — I10 Essential (primary) hypertension: Secondary | ICD-10-CM | POA: Diagnosis not present

## 2022-08-10 DIAGNOSIS — I10 Essential (primary) hypertension: Secondary | ICD-10-CM | POA: Diagnosis not present

## 2022-08-11 DIAGNOSIS — I10 Essential (primary) hypertension: Secondary | ICD-10-CM | POA: Diagnosis not present

## 2022-08-14 DIAGNOSIS — I10 Essential (primary) hypertension: Secondary | ICD-10-CM | POA: Diagnosis not present

## 2022-08-15 DIAGNOSIS — I10 Essential (primary) hypertension: Secondary | ICD-10-CM | POA: Diagnosis not present

## 2022-08-16 DIAGNOSIS — I10 Essential (primary) hypertension: Secondary | ICD-10-CM | POA: Diagnosis not present

## 2022-08-17 DIAGNOSIS — I10 Essential (primary) hypertension: Secondary | ICD-10-CM | POA: Diagnosis not present

## 2022-08-18 ENCOUNTER — Other Ambulatory Visit: Payer: Self-pay | Admitting: Internal Medicine

## 2022-08-18 DIAGNOSIS — I10 Essential (primary) hypertension: Secondary | ICD-10-CM | POA: Diagnosis not present

## 2022-08-21 DIAGNOSIS — I10 Essential (primary) hypertension: Secondary | ICD-10-CM | POA: Diagnosis not present

## 2022-08-22 DIAGNOSIS — I10 Essential (primary) hypertension: Secondary | ICD-10-CM | POA: Diagnosis not present

## 2022-08-23 DIAGNOSIS — I10 Essential (primary) hypertension: Secondary | ICD-10-CM | POA: Diagnosis not present

## 2022-08-24 DIAGNOSIS — I10 Essential (primary) hypertension: Secondary | ICD-10-CM | POA: Diagnosis not present

## 2022-08-25 DIAGNOSIS — I10 Essential (primary) hypertension: Secondary | ICD-10-CM | POA: Diagnosis not present

## 2022-08-28 DIAGNOSIS — I10 Essential (primary) hypertension: Secondary | ICD-10-CM | POA: Diagnosis not present

## 2022-08-29 DIAGNOSIS — I10 Essential (primary) hypertension: Secondary | ICD-10-CM | POA: Diagnosis not present

## 2022-08-30 ENCOUNTER — Other Ambulatory Visit: Payer: Self-pay | Admitting: Internal Medicine

## 2022-08-30 DIAGNOSIS — I10 Essential (primary) hypertension: Secondary | ICD-10-CM | POA: Diagnosis not present

## 2022-08-31 DIAGNOSIS — I10 Essential (primary) hypertension: Secondary | ICD-10-CM | POA: Diagnosis not present

## 2022-09-01 DIAGNOSIS — I10 Essential (primary) hypertension: Secondary | ICD-10-CM | POA: Diagnosis not present

## 2022-09-04 DIAGNOSIS — I10 Essential (primary) hypertension: Secondary | ICD-10-CM | POA: Diagnosis not present

## 2022-09-05 DIAGNOSIS — I10 Essential (primary) hypertension: Secondary | ICD-10-CM | POA: Diagnosis not present

## 2022-09-06 DIAGNOSIS — I10 Essential (primary) hypertension: Secondary | ICD-10-CM | POA: Diagnosis not present

## 2022-09-07 ENCOUNTER — Other Ambulatory Visit: Payer: Self-pay | Admitting: Nurse Practitioner

## 2022-09-07 DIAGNOSIS — I10 Essential (primary) hypertension: Secondary | ICD-10-CM | POA: Diagnosis not present

## 2022-09-07 DIAGNOSIS — G8929 Other chronic pain: Secondary | ICD-10-CM

## 2022-09-08 DIAGNOSIS — I10 Essential (primary) hypertension: Secondary | ICD-10-CM | POA: Diagnosis not present

## 2022-09-11 DIAGNOSIS — I10 Essential (primary) hypertension: Secondary | ICD-10-CM | POA: Diagnosis not present

## 2022-09-12 DIAGNOSIS — I10 Essential (primary) hypertension: Secondary | ICD-10-CM | POA: Diagnosis not present

## 2022-09-13 ENCOUNTER — Telehealth: Payer: Self-pay | Admitting: Neurology

## 2022-09-13 DIAGNOSIS — I10 Essential (primary) hypertension: Secondary | ICD-10-CM | POA: Diagnosis not present

## 2022-09-13 NOTE — Telephone Encounter (Signed)
New PA submitted via CMM Key: [id number] Approved, Coverage Starts on: 09/13/2022- Coverage Ends on: 09/13/2023

## 2022-09-13 NOTE — Telephone Encounter (Signed)
Received a PA approval notification for the patient for Em[employer]. Approved 09/13/2022-09/13/2023 through [employer] insurance

## 2022-09-14 DIAGNOSIS — I10 Essential (primary) hypertension: Secondary | ICD-10-CM | POA: Diagnosis not present

## 2022-09-15 DIAGNOSIS — I10 Essential (primary) hypertension: Secondary | ICD-10-CM | POA: Diagnosis not present

## 2022-09-18 DIAGNOSIS — I10 Essential (primary) hypertension: Secondary | ICD-10-CM | POA: Diagnosis not present

## 2022-09-19 DIAGNOSIS — I10 Essential (primary) hypertension: Secondary | ICD-10-CM | POA: Diagnosis not present

## 2022-09-20 DIAGNOSIS — I10 Essential (primary) hypertension: Secondary | ICD-10-CM | POA: Diagnosis not present

## 2022-09-21 DIAGNOSIS — I10 Essential (primary) hypertension: Secondary | ICD-10-CM | POA: Diagnosis not present

## 2022-09-22 DIAGNOSIS — I10 Essential (primary) hypertension: Secondary | ICD-10-CM | POA: Diagnosis not present

## 2022-09-23 ENCOUNTER — Other Ambulatory Visit: Payer: Self-pay | Admitting: Internal Medicine

## 2022-09-25 DIAGNOSIS — I10 Essential (primary) hypertension: Secondary | ICD-10-CM | POA: Diagnosis not present

## 2022-09-26 DIAGNOSIS — I10 Essential (primary) hypertension: Secondary | ICD-10-CM | POA: Diagnosis not present

## 2022-09-27 DIAGNOSIS — I10 Essential (primary) hypertension: Secondary | ICD-10-CM | POA: Diagnosis not present

## 2022-09-28 ENCOUNTER — Ambulatory Visit: Payer: Medicaid Other | Attending: Internal Medicine | Admitting: Internal Medicine

## 2022-09-28 DIAGNOSIS — I10 Essential (primary) hypertension: Secondary | ICD-10-CM | POA: Diagnosis not present

## 2022-09-29 DIAGNOSIS — I10 Essential (primary) hypertension: Secondary | ICD-10-CM | POA: Diagnosis not present

## 2022-10-02 ENCOUNTER — Other Ambulatory Visit: Payer: Self-pay | Admitting: Internal Medicine

## 2022-10-02 DIAGNOSIS — I10 Essential (primary) hypertension: Secondary | ICD-10-CM | POA: Diagnosis not present

## 2022-10-03 DIAGNOSIS — I10 Essential (primary) hypertension: Secondary | ICD-10-CM | POA: Diagnosis not present

## 2022-10-04 DIAGNOSIS — I10 Essential (primary) hypertension: Secondary | ICD-10-CM | POA: Diagnosis not present

## 2022-10-05 DIAGNOSIS — I10 Essential (primary) hypertension: Secondary | ICD-10-CM | POA: Diagnosis not present

## 2022-10-06 DIAGNOSIS — I10 Essential (primary) hypertension: Secondary | ICD-10-CM | POA: Diagnosis not present

## 2022-10-09 DIAGNOSIS — I10 Essential (primary) hypertension: Secondary | ICD-10-CM | POA: Diagnosis not present

## 2022-10-10 DIAGNOSIS — I10 Essential (primary) hypertension: Secondary | ICD-10-CM | POA: Diagnosis not present

## 2022-10-11 ENCOUNTER — Ambulatory Visit: Payer: Self-pay | Admitting: Nurse Practitioner

## 2022-10-11 DIAGNOSIS — I10 Essential (primary) hypertension: Secondary | ICD-10-CM | POA: Diagnosis not present

## 2022-10-12 ENCOUNTER — Ambulatory Visit (HOSPITAL_COMMUNITY): Payer: Medicaid Other

## 2022-10-12 DIAGNOSIS — I10 Essential (primary) hypertension: Secondary | ICD-10-CM | POA: Diagnosis not present

## 2022-10-13 DIAGNOSIS — I10 Essential (primary) hypertension: Secondary | ICD-10-CM | POA: Diagnosis not present

## 2022-10-16 DIAGNOSIS — I10 Essential (primary) hypertension: Secondary | ICD-10-CM | POA: Diagnosis not present

## 2022-10-17 DIAGNOSIS — I10 Essential (primary) hypertension: Secondary | ICD-10-CM | POA: Diagnosis not present

## 2022-10-18 DIAGNOSIS — I10 Essential (primary) hypertension: Secondary | ICD-10-CM | POA: Diagnosis not present

## 2022-10-18 NOTE — Progress Notes (Unsigned)
[facility]    Provider:  Dr Jaynee Eagles Requesting Provider: Vevelyn Francois, NP Primary Care Provider:  Fenton Foy, NP   No chief complaint on file.     HPI:   Update 10/19/2022 JM: Patient returns for migraine follow-up visit.  She has remained on Emgality with great control of migraine frequency, currently experiencing *** migraine days per month.  Use of Nurtec ***.  Was seen by Dr. Brett Fairy for possible underlying sleep apnea in 02/2022, ordered sleep study at that time but she no showed sleep study in 03/2022.      History provided for reference purposes only Update 10/13/2021 JM: returns for migraine follow up accompanied by her son, Gerald Stabs.  Previously saw Dr. Jaynee Eagles on 05/25/2020 for initial consult visit.  Started on [employer] and [employer].  Reports migraine headaches currently occurring x1 per month (previously daily). Will use Nurtec with great benefit.  Tolerating both well without side effects. Did not complete sleep study as previously ordered. Does continue to have insomnia and daytime fatigue.  No new concerns at this time.   Consult visit 05/25/2020 Dr. Jaynee Eagles:  [name] is a 67 y.o. female here as requested by Vevelyn Francois, NP for migraines.  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 I reviewed epic notes, patient was seen in the emergency room in July of this year for migraine: She had a sudden onset constant diffuse chest pain, the night before she started having migraine headaches, she tried to go to sleep and ignore it however it kept her up around 5 AM she took a Fioricet, when she woke up up later in the morning and she reported the headache had resolved. she also reported other symptoms such as shortness of breath, nausea, emesis, diaphoresis, EMS was called, pain started  with a migraine headache but then started having chest pain after taking Fioricet, when seen in the emergency room she was not have a headache and was there for more for the other symptoms.  It appears as though she was also admitted back in January for Covid positive and migraine, with acute hypoxic respiratory failure, non-intractable migraine, acute kidney injury, chronic adrenal insufficiency on home hydrocortisone, hyperlipidemia, hypothyroidism, depression and previous CVA (or MI really unclear).  Migraines started at the age of 20 after bing it in the head. Here with her son who also provides much information. The older she gets the worse she gets. Headaches start in the thr front of the head, continuous headache. Never had a sleep test. Wakes with dry mouth in the morning. Wakes up with headaches every day. Headaches every day, continuous just waxes and wanes all day, worsening 6 months ago with intractable headaches, no trauma, nothing maes it better or worse. Starts on the right side if a migraines or on the top, pulsating, pounding throbbing, light and sound and smell, significant nausea, vomiting, movement makes it worse, it is positional worse in the morning, can wake her up at night, she is also having vision changes in her right eye movement and changing positions make it worse. 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 Migraines daily as well.  Headaches daily.  Continuous headache.  No medication overuse, denies.  No aura.  Ongoing for years but worsening over the last 6 months with vision changes, nocturnal and morning headaches, positional quality.  She is tried and failed multiple medications including preventatives in acute.  We discussed her options, feel an MRI of the brain is warranted, would not hesitate to start Botox and we will initiate  CGRP. No other focal neurologic deficits, associated symptoms, inciting events or modifiable factors.  Reviewed notes, labs and imaging from outside physicians, which showed:    CT head 09/2019: IMPRESSION: Personally reviewed and agree with the following: 1. No acute intracranial abnormalities. 2. Significant mucosal thickening throughout the paranasal sinuses as above with no air-fluid levels.   Review of Systems: Patient complains of symptoms per HPI as well as the following symptoms: Headaches, fatigue, snoring, insomnia, migraines, Pertinent negatives and positives per HPI. All others negative.   Social History   Socioeconomic History   Marital status: Married    Spouse name: [name]   Number of children: 2   Years of education: Not on file   Highest education level: Some college, no degree  Occupational History   Occupation: Disabled  Tobacco Use   Smoking status: Never   Smokeless tobacco: Never  Vaping Use   Vaping Use: Never used  Substance and Sexual Activity   Alcohol use: No   Drug use: No   Sexual activity: Not Currently  Other Topics Concern   Not on file  Social History Narrative   Lives w husband and son   Left handed   Caffeine: none    Social Determinants of Health   Financial Resource Strain: Not on file  Food Insecurity: Not on file  Transportation Needs: Not on file  Physical Activity: Not on file  Stress: Not on file  Social Connections: Not on file  Intimate Partner Violence: Not on file    Family History  Problem Relation Age of Onset   Acromegaly Father    Heart disease Father        Tumor in the heart   Stomach cancer Father    Colon cancer Sister 106   Diabetes Sister    Kidney disease Sister        x2   Heart disease Sister        Tumor in the heart   Heart disease Daughter        Tumor in the heart   Colon polyps Neg Hx    Esophageal cancer Neg Hx    Gallbladder disease Neg Hx    Migraines Neg Hx    Rectal cancer Neg Hx      Past Medical History:  Diagnosis Date   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HCC)    Allergy    Amblyopia    Anemia    Arthritis    "in my lower back"   Asthma    Atrial myxoma    Blindness of left eye 1978   Blood transfusion    Breast cyst    Constipation    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HCC)    CVA (cerebral infarction)    Depression    Depression    GERD (gastroesophageal reflux disease)    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[place])    Hemorrhoids    History of open heart surgery 1977   Hyperlipemia    Hypothyroidism    per office note dated 03/25/2014   Migraine    Peptic ulcer associated with Helicobacter pylori infection    Reflux  Stroke [facility]) 1978   "piece tumor broke off & went to left eye; leaving me blind"    Patient Active Problem List   Diagnosis Date Noted   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[place]) 07/21/2022   Chronic bilateral low back pain with right-sided sciatica 07/13/2022   Vitamin D deficiency 04/03/2022   Intractable episodic cluster headache 02/22/2022   History of COVID-19 02/22/2022   Morbid obesity with BMI of 40.0-44.9, adult ([place]) 02/22/2022   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   Hypersomnia with sleep apnea 02/22/2022   Psychophysiological insomnia 02/22/2022   Witnessed episode of apnea 02/22/2022   Abnormal CT of the abdomen 01/30/2022   Snoring 01/05/2022   AKI (acute kidney injury) ([place]) 12/24/2021   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   NSVT (nonsustained ventricular tachycardia) ([place]) 03/08/2021   New-onset angina ([place]) 12/04/2020   Chronic migraine without aura, with intractable migraine, so stated, with status migrainosus 05/25/2020   Frequent PVCs    COVID-19 10/10/2019   Hypoxia    Migraine with aura and without status migrainosus, not intractable    Tachycardia    ARF (acute renal failure) ([place]) 12/06/2018   Fever 12/06/2018   Acute hypoxemic respiratory failure ([place]) 12/06/2018   Headache 11/02/2013   Dizziness 11/02/2013   Anemia 01/10/2013    Chest pain, unspecified 01/10/2013   Adjustment disorder with mixed anxiety and depressed mood 01/10/2013   Adrenal crisis ([place]) 01/07/2013   Nausea & vomiting 08/03/2011   Hypotension 08/03/2011   Adrenal insufficiency ([place]) 08/03/2011   Hyponatremia 08/03/2011   Hypothyroidism 08/03/2011   Depression 08/03/2011    Past Surgical History:  Procedure Laterality Date   ADRENAL GLAND SURGERY  09/11/1980   on hydrocortisone   ATRIAL MYXOMA EXCISION  09/11/1976   BREAST BIOPSY  07/13/1999   ductal tissue excision & bx right; bx only left breast   COLONOSCOPY     EYE SURGERY     LEFT HEART CATHETERIZATION WITH CORONARY ANGIOGRAM N/A 10/06/2011   Procedure: LEFT HEART CATHETERIZATION WITH CORONARY ANGIOGRAM;  Surgeon: Clent Demark, MD;  Location: [facility];  Service: Cardiovascular;  Laterality: N/A;   ORBITOTOMY Right 07/27/2021   POLYPECTOMY     repaired cross eyed     "as a child"   ROTATOR CUFF REPAIR  05/12/2005   left   VAGINAL HYSTERECTOMY  09/11/1998   "partial; w/left ovary"    Current Outpatient Medications  Medication Sig Dispense Refill   aspirin EC 81 MG tablet Take 81 mg by mouth daily. Swallow whole.     EMGALITY 120 MG/ML SOAJ INJECT 120 MG into THE SKIN EVERY 30 DAYS 1 mL 10   fenofibrate (TRICOR) 48 MG tablet Take 1 tablet (48 mg total) by mouth daily. 90 tablet 1   flecainide (TAMBOCOR) 50 MG tablet TAKE ONE TABLET BY MOUTH TWICE DAILY 60 tablet 0   hydrocortisone (CORTEF) 10 MG tablet Take 1 tablet (10 mg total) by mouth as directed. 15 mg with Breakfast and 10 mg between 2-4 pm daily 250 tablet 3   hydrOXYzine (ATARAX) 10 MG tablet Take 1 tablet (10 mg total) by mouth at bedtime as needed. 30 tablet 0   levothyroxine (SYNTHROID) 75 MCG tablet Take 1 tablet (75 mcg total) by mouth daily. 30 tablet 2   methocarbamol (ROBAXIN) 500 MG tablet Take 1 tablet (500 mg total) by mouth 4 (four) times daily. (Patient taking differently: Take 500 mg by mouth every 8  (eight) hours as needed for muscle spasms.) 30 tablet 0  metoprolol tartrate (LOPRESSOR) 25 MG tablet Take 1 tablet (25 mg total) by mouth 2 (two) times daily. 180 tablet 3   nitroGLYCERIN (NITROSTAT) 0.4 MG SL tablet Place 1 tablet (0.4 mg total) under the tongue every 5 (five) minutes x 3 doses as needed for chest pain. 25 tablet 12   omega-3 acid ethyl esters (LOVAZA) 1 g capsule Take 1 capsule (1 g total) by mouth 2 (two) times daily. 60 capsule 3   omeprazole (PRILOSEC) 20 MG capsule Take 20 mg by mouth 2 (two) times daily.     Rimegepant Sulfate (NURTEC) 75 MG TBDP Take 75 mg by mouth daily as needed. For migraines. Take as close to onset of migraine as possible. One daily maximum. 8 tablet 6   rosuvastatin (CRESTOR) 10 MG tablet Take 1 tablet (10 mg total) by mouth daily. 30 tablet 11   venlafaxine XR (EFFEXOR-XR) 150 MG 24 hr capsule Take 1 capsule (150 mg total) by mouth daily. 30 capsule 11   Vitamin D, Ergocalciferol, (DRISDOL) 1.25 MG (50000 UNIT) CAPS capsule Take 1 capsule (50,000 Units total) by mouth every 7 (seven) days. 5 capsule 1   No current facility-administered medications for this visit.    Allergies as of 10/19/2022 - Review Complete 07/20/2022  Allergen Reaction Noted   Aspirin Other (See Comments) 10/03/2011   Ibuprofen [ibuprofen] Nausea And Vomiting 08/02/2011   Penicillins Nausea And Vomiting 06/02/2011    Vitals: There were no vitals taken for this visit. Last Weight:  Wt Readings from Last 1 Encounters:  07/20/22 230 lb (104.3 kg)   Last Height:   Ht Readings from Last 1 Encounters:  07/20/22 5' (1.524 m)   Physical exam: Exam: Gen: NAD, very pleasant middle-aged African-American female, conversant, well nourised, obese, well groomed                     CV: RRR, no MRG. No Carotid Bruits. No peripheral edema, warm, nontender   Neuro: Detailed Neurologic Exam  Speech:    Speech is normal; fluent and spontaneous with normal comprehension.   Cognition:    The patient is oriented to person, place, and time;     recent and remote memory intact;     language fluent;     normal attention, concentration,     fund of knowledge Cranial Nerves:    The pupils are equal, round, and reactive to light. Visual fields are full to finger confrontation. Extraocular movements are intact. Trigeminal sensation is intact and the muscles of mastication are normal. The face is symmetric. The palate elevates in the midline. Hearing intact. Voice is normal. Shoulder shrug is normal. The tongue has normal motion without fasciculations.   Coordination:    Normal finger to nose and heel to shin. Normal rapid alternating movements.   Gait:    Heel-toe and tandem gait are normal.   Motor Observation:    No asymmetry, no atrophy, and no involuntary movements noted. Tone:    Normal muscle tone.    Posture:    Posture is normal. normal erect    Strength:    Strength is V/V in the upper and lower limbs.      Sensation: intact to LT     Reflex Exam:  DTR's:    Deep tendon reflexes in the upper and lower extremities are normal bilaterally.   Toes:    The toes are downgoing bilaterally.   Clonus:    Clonus is absent.    Assessment/Plan:  67 year old with chronic migraines followed by Dr. Jaynee Eagles.  Initially referred due to worsening migraine headaches, vision changes in right eye with swelling and positional headaches.  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    -Headaches greatly improved on Emgality - will continue -refill provided -Continue Nurtec as needed for emergent management.  Triptans contraindicated due to history of stroke and CAD -Referral placed to [facility] for sleep study evaluation  and concern of underlying sleep apnea -Continue to follow with ophthalmology as advised    Discussed: To prevent or relieve headaches, try the following: Cool Compress. Lie down and place a cool compress on your head.  Avoid headache triggers. If certain foods or odors seem to have triggered your migraines in the past, avoid them. A headache diary might help you identify triggers.  Include physical activity in your daily routine. Try a daily walk or other moderate aerobic exercise.  Manage stress. Find healthy ways to cope with the stressors, such as delegating tasks on your to-do list.  Practice relaxation techniques. Try deep breathing, yoga, massage and visualization.  Eat regularly. Eating regularly scheduled meals and maintaining a healthy diet might help prevent headaches. Also, drink plenty of fluids.  Follow a regular sleep schedule. Sleep deprivation might contribute to headaches Consider biofeedback. With this mind-body technique, you learn to control certain bodily functions -- such as muscle tension, heart rate and blood pressure -- to prevent headaches or reduce headache pain.    Proceed to emergency room if you experience new or worsening symptoms or symptoms do not resolve, if you have new neurologic symptoms or if headache is severe, or for any concerning symptom.   Provided education and documentation from American headache Society toolbox including articles on: chronic migraine medication overuse headache, chronic migraines, prevention of migraines, behavioral and other nonpharmacologic treatments for headache.    No orders of the defined types were placed in this encounter.  No orders of the defined types were placed in this encounter.   Cc: Vevelyn Francois, NP,    I spent 26 minutes of face-to-face and non-face-to-face time with patient and son.  This included previsit chart review, lab review, study review, order entry, electronic health record documentation, patient  education and discussion regarding chronic migraines and continued treatment options, possible underlying sleep apnea and further evaluation and answered all other questions to patients and sons satisfaction   Frann Rider, AGNP-BC  [facility] [address] [place], [place] [address]  Phone [phone] Fax [phone] Note: This document was prepared with digital dictation and possible smart phrase technology. Any transcriptional errors that result from this process are unintentional.

## 2022-10-19 ENCOUNTER — Ambulatory Visit: Payer: Medicaid Other | Admitting: Adult Health

## 2022-10-19 ENCOUNTER — Encounter: Payer: Self-pay | Admitting: Adult Health

## 2022-10-19 DIAGNOSIS — G43711 Chronic migraine without aura, intractable, with status migrainosus: Secondary | ICD-10-CM

## 2022-10-19 DIAGNOSIS — I10 Essential (primary) hypertension: Secondary | ICD-10-CM | POA: Diagnosis not present

## 2022-10-19 MED ORDER — NURTEC 75 MG PO TBDP: 75.0000 mg | ORAL_TABLET | Freq: Every day | ORAL | 11 refills | Status: DC | PRN

## 2022-10-19 MED ORDER — EMGALITY 120 MG/ML ~~LOC~~ SOAJ: 120.0000 mg | SUBCUTANEOUS | 11 refills | Status: DC

## 2022-10-19 NOTE — Patient Instructions (Signed)
Continue Emgality monthly injection and Nurtec as needed for rescue - refills provided  Please follow up with your primary doctor regarding low back pain - she placed orders to have imaging completed of your lower back which was sent to [facility] - you can call them to schedule appointment or see if they accept walk in's to have these completed    Follow up in 1 year or call earlier if needed

## 2022-10-20 DIAGNOSIS — I10 Essential (primary) hypertension: Secondary | ICD-10-CM | POA: Diagnosis not present

## 2022-10-22 ENCOUNTER — Encounter (HOSPITAL_COMMUNITY): Payer: Self-pay

## 2022-10-23 ENCOUNTER — Ambulatory Visit (HOSPITAL_COMMUNITY): Admission: RE | Admit: 2022-10-23 | Payer: Medicaid Other | Source: Ambulatory Visit

## 2022-10-23 ENCOUNTER — Ambulatory Visit (HOSPITAL_COMMUNITY): Payer: Medicaid Other

## 2022-10-23 DIAGNOSIS — I10 Essential (primary) hypertension: Secondary | ICD-10-CM | POA: Diagnosis not present

## 2022-10-23 HISTORY — DX: Hypoxemia: R09.02

## 2022-10-24 DIAGNOSIS — I10 Essential (primary) hypertension: Secondary | ICD-10-CM | POA: Diagnosis not present

## 2022-10-25 DIAGNOSIS — I10 Essential (primary) hypertension: Secondary | ICD-10-CM | POA: Diagnosis not present

## 2022-10-26 DIAGNOSIS — I10 Essential (primary) hypertension: Secondary | ICD-10-CM | POA: Diagnosis not present

## 2022-10-27 DIAGNOSIS — I10 Essential (primary) hypertension: Secondary | ICD-10-CM | POA: Diagnosis not present

## 2022-10-27 NOTE — Telephone Encounter (Signed)
Lvm for pt to call back to advise the name of the medications [facility]

## 2022-10-27 NOTE — Telephone Encounter (Signed)
Caller & Relationship to patient:  MRN #  [id number]   Call Back Number:   Date of Last Office Visit: 09/07/2022     Date of Next Office Visit: 10/11/2022    Medication(s) to be Refilled: Heart med to be refilled  Preferred Pharmacy:   ** Please notify patient to allow 48-72 hours to process** **Let patient know to contact pharmacy at the end of the day to make sure medication is ready. ** **If patient has not been seen in a year or longer, book an appointment **Advise to use MyChart for refill requests OR to contact their pharmacy

## 2022-10-30 DIAGNOSIS — I10 Essential (primary) hypertension: Secondary | ICD-10-CM | POA: Diagnosis not present

## 2022-10-31 ENCOUNTER — Telehealth: Payer: Self-pay | Admitting: Nurse Practitioner

## 2022-10-31 ENCOUNTER — Ambulatory Visit: Payer: Medicaid Other | Admitting: Internal Medicine

## 2022-10-31 ENCOUNTER — Other Ambulatory Visit: Payer: Self-pay | Admitting: Internal Medicine

## 2022-10-31 ENCOUNTER — Telehealth: Payer: Self-pay | Admitting: Internal Medicine

## 2022-10-31 DIAGNOSIS — I10 Essential (primary) hypertension: Secondary | ICD-10-CM | POA: Diagnosis not present

## 2022-10-31 DIAGNOSIS — I472 Ventricular tachycardia, unspecified: Secondary | ICD-10-CM

## 2022-10-31 DIAGNOSIS — R55 Syncope and collapse: Secondary | ICD-10-CM

## 2022-10-31 DIAGNOSIS — E781 Pure hyperglyceridemia: Secondary | ICD-10-CM

## 2022-10-31 MED ORDER — METOPROLOL TARTRATE 25 MG PO TABS: 25.0000 mg | ORAL_TABLET | Freq: Two times a day (BID) | ORAL | 0 refills | Status: DC

## 2022-10-31 NOTE — Telephone Encounter (Signed)
Patient is aware of desired medication being sent to desired pharmacy. Patient verbalized understanding.

## 2022-10-31 NOTE — Telephone Encounter (Signed)
*  STAT* If patient is at the pharmacy, call can be transferred to refill team.   1. Which medications need to be refilled? (please list name of each medication and dose if known)  metoprolol tartrate (LOPRESSOR) 25 MG tablet  2. Which pharmacy/location (including street and city if local pharmacy) is medication to be sent to? [place], [place]   3. Do they need a 30 day or 90 day supply?   Patient is completely of of medication. She will need enough medication to last her until 4/24 appointment with Dr. Margaretann Loveless.

## 2022-10-31 NOTE — Progress Notes (Incomplete)
Cardiology Office Note:    Date:  10/31/2022   ID:  [name], [name] 01-Nov-1955, MRN [id number]  PCP:  Mikayla Foy, NP  Cardiologist:  None  Electrophysiologist:  None   Referring MD: Mikayla Foy, NP   Chief Complaint/Reason for Referral: Follow up, preoperative cardiovascular risk assessment.   History of Present Illness:    [name] is a 67 y.o. 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  Recent hospitalization for chest pain radiating to the left arm concerning for cardiac chest pain. She ruled out for MI. Discharged with plan for outpatient follow up.  Coronary CTA which showed epicardial coronary artery disease.  She has no coronary calcifications.  No recurrence of myxoma. She had frequent NSVT I referred her to electrophysiology.  Dr. Lovena Le placed her on flecainide 50 mg twice daily.  She feels "back to her normal self".  During her last visit 05/06/2021 her symptoms of chest pain and palpitations have improved greatly.  She was due for follow-up for flecainide monitoring but does not recall having an appointment to attend.  We completed an EKG showing normal QRS duration.  This coupled with no epicardial coronary artery disease is somewhat reassuring in the setting of use of flecainide.  I would like for her to follow-up with electrophysiology for drug monitoring.  No interval syncope.  Echocardiogram showed grossly normal biventricular size and function.  I had independently reviewed the images from her coronary CTA demonstrating retained epicardial pacing wires and probable LV apical myocardial defect secondary to likely LV access/vent from prior surgery.  Today, the patient states that she  She denies any palpitations, chest pain, shortness of breath, or peripheral  edema. No lightheadedness, headaches, syncope, orthopnea, or PND.  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[place])    CVA (cerebral infarction)    Depression    Depression    GERD (gastroesophageal reflux disease)    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HCC)    Hemorrhoids    History of open heart surgery 1977   Hyperlipemia    Hypothyroidism    per office note dated 03/25/2014   Hypoxia    Migraine    Peptic ulcer associated with Helicobacter pylori infection    Reflux    Stroke ([place]) 1978   "piece tumor broke off & went to left eye; leaving me blind"    Past Surgical History:  Procedure Laterality Date   ADRENAL GLAND SURGERY  09/11/1980   on hydrocortisone   ATRIAL MYXOMA EXCISION  09/11/1976   BREAST BIOPSY  07/13/1999   ductal tissue excision & bx right; bx only left breast   COLONOSCOPY     EYE SURGERY     LEFT HEART CATHETERIZATION WITH CORONARY ANGIOGRAM N/A 10/06/2011   Procedure: LEFT HEART CATHETERIZATION WITH CORONARY ANGIOGRAM;  Surgeon: Clent Demark, MD;  Location: [facility];  Service: Cardiovascular;  Laterality: N/A;   ORBITOTOMY Right 07/27/2021   POLYPECTOMY     repaired  cross eyed     "as a child"   ROTATOR CUFF REPAIR  05/12/2005   left   VAGINAL HYSTERECTOMY  09/11/1998   "partial; w/left ovary"    Current Medications: No outpatient medications have been marked as taking for the 10/31/22 encounter (Appointment) with Elouise Munroe, MD.     Allergies:   Aspirin, Ibuprofen [ibuprofen], and Penicillins   Social History   Tobacco Use   Smoking status: Never   Smokeless tobacco: Never  Vaping Use   Vaping Use: Never used  Substance Use Topics   Alcohol use: No   Drug use: No     Family History: The  patient's family history includes Acromegaly in her father; Colon cancer (age of onset: 56) in her sister; Diabetes in her sister; Heart disease in her daughter, father, and sister; Kidney disease in her sister; Stomach cancer in her father. There is no history of Colon polyps, Esophageal cancer, Gallbladder disease, Migraines, or Rectal cancer.  ROS:   Please see the history of present illness.     All other systems reviewed and are negative.  EKGs/Labs/Other Studies Reviewed:    The following studies were reviewed today:  EKG:  The EKG is personally reviewed. 10/31/2022: *** 05/06/2021: SR with one PVC, nonspecific T wave abnl, QRS 86 ms, Qtc 404 msec  I have independently reviewed the images from CTA coronary 03/03/21.  Recent Labs: 12/24/2021: Magnesium 1.3; TSH 0.831 07/13/2022: ALT 14; BUN 15; Creatinine, Ser 1.01; Hemoglobin 13.4; Platelets 323; Potassium 4.2; Sodium 137  Recent Lipid Panel    Component Value Date/Time   CHOL 138 05/30/2021 1056   TRIG 129 05/30/2021 1056   HDL 46 05/30/2021 1056   CHOLHDL 3.0 05/30/2021 1056   CHOLHDL 2.7 12/05/2020 0251   VLDL 22 12/05/2020 0251   LDLCALC 69 05/30/2021 1056    Physical Exam:    VS:  There were no vitals taken for this visit.    Wt Readings from Last 5 Encounters:  10/19/22 220 lb (99.8 kg)  07/20/22 230 lb (104.3 kg)  07/13/22 230 lb (104.3 kg)  03/31/22 218 lb 9.6 oz (99.2 kg)  02/22/22 218 lb (98.9 kg)    Constitutional: No acute distress Eyes: sclera non-icteric, normal conjunctiva and lids ENMT: normal dentition, moist mucous membranes Cardiovascular: regular rhythm, normal rate, no murmurs. S1 and S2 normal. Radial pulses normal bilaterally. No jugular venous distention.  Respiratory: clear to auscultation bilaterally GI : normal bowel sounds, soft and nontender. No distention.   MSK: extremities warm, well perfused. No edema.  NEURO: grossly nonfocal exam, moves all extremities. PSYCH: alert and oriented x  3, normal mood and affect.   ASSESSMENT:    No diagnosis found.  PLAN:    Frequent PVCs - Plan: EKG 12-Lead NSVT (nonsustained ventricular tachycardia) (HCC) Ventricular tachycardia (HCC) Palpitations Syncope and collapse -Symptoms are greatly improved on flecainide 50 mg twice daily.  Recommend EP follow-up as was planned previously, ECG reassuring today.  Precordial pain-no coronary artery calcifications or coronary artery disease on recent CCTA.  Symptoms have improved.  History of atrial myxoma-no recurrence, no strokelike symptoms.  Primary hypertension-blood pressure well controlled today on metoprolol 25 mg twice daily.  Hyperlipidemia, unspecified hyperlipidemia type-lipids optimized on rosuvastatin 10 mg daily.  Preoperative cardiovascular examination  The patient is intermediate risk for intermediate risk procedure due to the need for general anesthesia.  Risk is felt to be nonmodifiable and contributed to by factors noted above.  No further cardiovascular testing  is required prior to the procedure.  If this level of risk is acceptable to the patient and surgical team, the patient should be considered optimized from a cardiovascular standpoint.  Patient understands risk stratification and is willing to proceed.  Given no findings of coronary artery disease, patient can stop ASA 81 mg daily prior to surgery 7 days and there is no strong indication to resume afterward.  We discussed this in shared decision-making with her son in the room as well and we are all in agreement.  Plan: ***  Total time of encounter: 30 minutes total time of encounter, including 20 minutes spent in face-to-face patient care on the date of this encounter. This time includes coordination of care and counseling regarding above mentioned problem list. Remainder of non-face-to-face time involved reviewing chart documents/testing relevant to the patient encounter and documentation in the medical record. I  have independently reviewed documentation from referring provider.   Cherlynn Kaiser, MD, [place]   Shared Decision Making/Informed Consent:       Medication Adjustments/Labs and Tests Ordered: Current medicines are reviewed at length with the patient today.  Concerns regarding medicines are outlined above.   No orders of the defined types were placed in this encounter.   No orders of the defined types were placed in this encounter.    There are no Patient Instructions on file for this visit.   I,Coren O'Brien,acting as a Education administrator for Elouise Munroe, MD.,have documented all relevant documentation on the behalf of Elouise Munroe, MD,as directed by  Elouise Munroe, MD while in the presence of Elouise Munroe, MD.  ***

## 2022-10-31 NOTE — Telephone Encounter (Signed)
Caller & Relationship to patient:  MRN #  [id number]   Call Back Number:   Date of Last Office Visit: 10/11/2022     Date of Next Office Visit: 01/11/2023   [phone] Chance Mathieu (Son) Has questions about procedures on my chart   Preferred Pharmacy:   ** Please notify patient to allow 48-72 hours to process** **Let patient know to contact pharmacy at the end of the day to make sure medication is ready. ** **If patient has not been seen in a year or longer, book an appointment **Advise to use MyChart for refill requests OR to contact their pharmacy

## 2022-11-01 ENCOUNTER — Telehealth: Payer: Self-pay | Admitting: Nurse Practitioner

## 2022-11-01 DIAGNOSIS — I10 Essential (primary) hypertension: Secondary | ICD-10-CM | POA: Diagnosis not present

## 2022-11-01 NOTE — Telephone Encounter (Signed)
Caller & Relationship to patient:  MRN #  [id number]   Call Back Number:   Date of Last Office Visit: 10/31/2022     Date of Next Office Visit: 01/11/2023    Medication(s) to be Refilled: VD   Preferred Pharmacy:   ** Please notify patient to allow 48-72 hours to process** **Let patient know to contact pharmacy at the end of the day to make sure medication is ready. ** **If patient has not been seen in a year or longer, book an appointment **Advise to use MyChart for refill requests OR to contact their pharmacy

## 2022-11-02 ENCOUNTER — Telehealth: Payer: Self-pay | Admitting: Pulmonary Disease

## 2022-11-02 DIAGNOSIS — I10 Essential (primary) hypertension: Secondary | ICD-10-CM | POA: Diagnosis not present

## 2022-11-02 DIAGNOSIS — R0683 Snoring: Secondary | ICD-10-CM

## 2022-11-02 NOTE — Telephone Encounter (Signed)
Yes thanks 

## 2022-11-02 NOTE — Telephone Encounter (Signed)
Patient's son is calling regarding split night sleep study.  He stated that his mother has not been contacted for the study yet.  Please advise and call son to discuss further.  CB# [phone]

## 2022-11-03 DIAGNOSIS — I10 Essential (primary) hypertension: Secondary | ICD-10-CM | POA: Diagnosis not present

## 2022-11-04 ENCOUNTER — Other Ambulatory Visit: Payer: Self-pay | Admitting: Internal Medicine

## 2022-11-06 DIAGNOSIS — I10 Essential (primary) hypertension: Secondary | ICD-10-CM | POA: Diagnosis not present

## 2022-11-06 NOTE — Telephone Encounter (Signed)
The last split night study order was placed on 02/03/22 and insurance denied it.  Shakima documented she sent message to [facility].

## 2022-11-06 NOTE — Telephone Encounter (Signed)
The last split night study order was placed on 02/03/22 and insurance denied it. Shakima documented she sent message to [facility]   Please advise sir

## 2022-11-08 DIAGNOSIS — I10 Essential (primary) hypertension: Secondary | ICD-10-CM | POA: Diagnosis not present

## 2022-11-09 ENCOUNTER — Telehealth: Payer: Self-pay | Admitting: Internal Medicine

## 2022-11-09 DIAGNOSIS — I10 Essential (primary) hypertension: Secondary | ICD-10-CM | POA: Diagnosis not present

## 2022-11-09 DIAGNOSIS — I472 Ventricular tachycardia, unspecified: Secondary | ICD-10-CM

## 2022-11-09 DIAGNOSIS — R55 Syncope and collapse: Secondary | ICD-10-CM

## 2022-11-09 DIAGNOSIS — E781 Pure hyperglyceridemia: Secondary | ICD-10-CM

## 2022-11-09 MED ORDER — FLECAINIDE ACETATE 50 MG PO TABS: 50.0000 mg | ORAL_TABLET | Freq: Two times a day (BID) | ORAL | 0 refills | Status: DC

## 2022-11-09 MED ORDER — METOPROLOL TARTRATE 25 MG PO TABS: 25.0000 mg | ORAL_TABLET | Freq: Two times a day (BID) | ORAL | 0 refills | Status: DC

## 2022-11-09 MED ORDER — FENOFIBRATE 48 MG PO TABS: 48.0000 mg | ORAL_TABLET | Freq: Every day | ORAL | 0 refills | Status: DC

## 2022-11-09 NOTE — Telephone Encounter (Signed)
Margo Aye, pt is overdue for an appt with Dr. Lovena Le, can you please help with this? Thanks

## 2022-11-09 NOTE — Telephone Encounter (Signed)
*  STAT* If patient is at the pharmacy, call can be transferred to refill team.   1. Which medications need to be refilled? (please list name of each medication and dose if known)  flecainide (TAMBOCOR) 50 MG tablet  fenofibrate (TRICOR) 48 MG tablet  metoprolol tartrate (LOPRESSOR) 25 MG tablet    2. Which pharmacy/location (including street and city if local pharmacy) is medication to be sent to? [place], [place]    3. Do they need a 30 day or 90 day supply? 30  Patient has an appt in April but states she is out of these medications. Spoke to patient and her son, Gerald Stabs.

## 2022-11-10 ENCOUNTER — Encounter (HOSPITAL_COMMUNITY): Payer: Self-pay

## 2022-11-10 ENCOUNTER — Other Ambulatory Visit: Payer: Self-pay

## 2022-11-10 ENCOUNTER — Emergency Department (HOSPITAL_COMMUNITY)
Admission: EM | Admit: 2022-11-10 | Discharge: 2022-11-10 | Disposition: A | Discharge status: Home / Self Care | Payer: Medicaid Other | Attending: Emergency Medicine | Admitting: Emergency Medicine

## 2022-11-10 ENCOUNTER — Emergency Department (HOSPITAL_COMMUNITY): Payer: Medicaid Other

## 2022-11-10 DIAGNOSIS — Z79890 Hormone replacement therapy (postmenopausal): Secondary | ICD-10-CM | POA: Insufficient documentation

## 2022-11-10 DIAGNOSIS — R079 Chest pain, unspecified: Secondary | ICD-10-CM

## 2022-11-10 DIAGNOSIS — R55 Syncope and collapse: Secondary | ICD-10-CM

## 2022-11-10 DIAGNOSIS — Z8673 Personal history of transient ischemic attack (TIA), and cerebral infarction without residual deficits: Secondary | ICD-10-CM | POA: Diagnosis not present

## 2022-11-10 DIAGNOSIS — R0789 Other chest pain: Secondary | ICD-10-CM | POA: Diagnosis not present

## 2022-11-10 DIAGNOSIS — E039 Hypothyroidism, unspecified: Secondary | ICD-10-CM | POA: Diagnosis not present

## 2022-11-10 DIAGNOSIS — I10 Essential (primary) hypertension: Secondary | ICD-10-CM | POA: Diagnosis not present

## 2022-11-10 DIAGNOSIS — I959 Hypotension, unspecified: Secondary | ICD-10-CM | POA: Diagnosis not present

## 2022-11-10 DIAGNOSIS — I472 Ventricular tachycardia, unspecified: Secondary | ICD-10-CM

## 2022-11-10 DIAGNOSIS — Z7982 Long term (current) use of aspirin: Secondary | ICD-10-CM | POA: Insufficient documentation

## 2022-11-10 LAB — CBC
HCT: 42.8 % (ref 36.0–46.0)
Hemoglobin: 14.2 g/dL (ref 12.0–15.0)
MCH: 29.9 pg (ref 26.0–34.0)
MCHC: 33.2 g/dL (ref 30.0–36.0)
MCV: 90.1 fL (ref 80.0–100.0)
Platelets: 346 10*3/uL (ref 150–400)
RBC: 4.75 MIL/uL (ref 3.87–5.11)
RDW: 13.2 % (ref 11.5–15.5)
WBC: 6.4 10*3/uL (ref 4.0–10.5)
nRBC: 0 % (ref 0.0–0.2)

## 2022-11-10 LAB — BASIC METABOLIC PANEL
Anion gap: 10 (ref 5–15)
BUN: 10 mg/dL (ref 8–23)
CO2: 24 mmol/L (ref 22–32)
Calcium: 9.8 mg/dL (ref 8.9–10.3)
Chloride: 101 mmol/L (ref 98–111)
Creatinine, Ser: 0.92 mg/dL (ref 0.44–1.00)
GFR, Estimated: >60 mL/min (ref >60)
Glucose, Bld: 183 mg/dL — ABNORMAL HIGH (ref 70–99)
Potassium: 3.9 mmol/L (ref 3.5–5.1)
Sodium: 135 mmol/L (ref 135–145)

## 2022-11-10 LAB — TROPONIN I (HIGH SENSITIVITY)
Troponin I (High Sensitivity): 2 ng/L (ref <18)
Troponin I (High Sensitivity): 4 ng/L (ref <18)

## 2022-11-10 MED ORDER — METOPROLOL TARTRATE 25 MG PO TABS: 25.0000 mg | ORAL_TABLET | Freq: Two times a day (BID) | ORAL | 0 refills | Status: DC

## 2022-11-10 MED ORDER — ONDANSETRON HCL 4 MG/2ML IJ SOLN
4.0000 mg | Freq: Once | INTRAMUSCULAR | Status: AC
  Administered 2022-11-10: 4 mg via INTRAVENOUS
  Filled 2022-11-10: qty 2

## 2022-11-10 MED ORDER — METOPROLOL TARTRATE 25 MG PO TABS
25.0000 mg | ORAL_TABLET | Freq: Two times a day (BID) | ORAL | 0 refills | Status: DC
  Filled 2022-11-10: qty 60, 30d supply, fill #0

## 2022-11-10 NOTE — ED Triage Notes (Signed)
Chest pain radiating to left arm since yesterday. Hx of MI. Took 1 nitro SL at home with no relief. Alert and oriented x 4. '324mg'$  Aspirin taken prior to arrival.

## 2022-11-10 NOTE — ED Provider Notes (Signed)
[facility] Provider Note   CSN: [id number] Arrival date & time:        History  Chief Complaint  Patient presents with   Chest Pain    [name] is a 67 y.o. female.   Chest Pain    Patient has a history of reflux, stroke, hyperlipidemia, migraines, adrenal hypofunction, hypothyroidism, amblyopia in history of atrial myxoma excision in 1978.  Patient presents to the ED with complaints of chest pain in her left chest ongoing since yesterday.  Patient has felt nauseated and she feels like the pain radiates to her left arm.  She denies history of coronary artery disease but did have history of cardiac surgery when she was 20.  Patient denies any abdominal pain.  She denies any shortness of breath.  No coughing.  Home Medications Prior to Admission medications   Medication Sig Start Date End Date Taking? Authorizing Provider  aspirin EC 81 MG tablet Take 81 mg by mouth daily. Swallow whole.    [provider]  fenofibrate (TRICOR) 48 MG tablet Take 1 tablet (48 mg total) by mouth daily. 11/09/22   Evans Lance, MD  flecainide (TAMBOCOR) 50 MG tablet Take 1 tablet (50 mg total) by mouth 2 (two) times daily. 11/09/22   Evans Lance, MD  Galcanezumab-gnlm [facility]) 120 MG/ML SOAJ Inject 120 mg into the skin every 30 (thirty) days. 10/19/22   Frann Rider, NP  hydrocortisone (CORTEF) 10 MG tablet Take 1 tablet (10 mg total) by mouth as directed. 15 mg with Breakfast and 10 mg between 2-4 pm daily 07/20/22   Shamleffer, Melanie Crazier, MD  hydrOXYzine (ATARAX) 10 MG tablet Take 1 tablet (10 mg total) by mouth at bedtime as needed. 01/04/22   Fenton Foy, NP  levothyroxine (SYNTHROID) 75 MCG tablet Take 1 tablet (75 mcg total) by mouth daily. 02/27/22 02/27/23  Fenton Foy, NP  methocarbamol (ROBAXIN) 500 MG tablet Take 1 tablet (500 mg total) by mouth 4 (four) times daily. Patient taking differently: Take 500 mg by  mouth every 8 (eight) hours as needed for muscle spasms. 04/30/21   Charlesetta Shanks, MD  metoprolol tartrate (LOPRESSOR) 25 MG tablet Take 1 tablet (25 mg total) by mouth 2 (two) times daily. 11/09/22   Evans Lance, MD  nitroGLYCERIN (NITROSTAT) 0.4 MG SL tablet Place 1 tablet (0.4 mg total) under the tongue every 5 (five) minutes x 3 doses as needed for chest pain. 12/05/20   Charolette Forward, MD  omega-3 acid ethyl esters (LOVAZA) 1 g capsule Take 1 capsule (1 g total) by mouth 2 (two) times daily. 02/08/22 02/08/23  Fenton Foy, NP  omeprazole (PRILOSEC) 20 MG capsule Take 20 mg by mouth 2 (two) times daily. 11/15/20   [provider]  Rimegepant Sulfate (NURTEC) 75 MG TBDP Take 1 tablet (75 mg total) by mouth daily as needed. For migraines. Take as close to onset of migraine as possible. One daily maximum. 10/19/22   Frann Rider, NP  rosuvastatin (CRESTOR) 10 MG tablet Take 1 tablet (10 mg total) by mouth daily. 12/02/21 12/02/22  Vevelyn Francois, NP  venlafaxine XR (EFFEXOR-XR) 150 MG 24 hr capsule Take 1 capsule (150 mg total) by mouth daily. 12/02/21 12/02/22  Vevelyn Francois, NP  Vitamin D, Ergocalciferol, (DRISDOL) 1.25 MG (50000 UNIT) CAPS capsule Take 1 capsule (50,000 Units total) by mouth every 7 (seven) days. 06/15/22   Fenton Foy, NP  Allergies    Aspirin, Ibuprofen [ibuprofen], and Penicillins    Review of Systems   Review of Systems  Cardiovascular:  Positive for chest pain.    Physical Exam Updated Vital Signs BP 123/79   Pulse 100   Temp 98.1 F (36.7 C) (Temporal)   Resp 15   Ht 1.524 m (5')   Wt 99.8 kg   SpO2 98%   BMI 42.97 kg/m  Physical Exam Vitals and nursing note reviewed.  Constitutional:      Appearance: She is well-developed. She is not diaphoretic.  HENT:     Head: Normocephalic and atraumatic.     Right Ear: External ear normal.     Left Ear: External ear normal.  Eyes:     General: No scleral icterus.       Right eye: No  discharge.        Left eye: No discharge.     Conjunctiva/sclera: Conjunctivae normal.  Neck:     Trachea: No tracheal deviation.  Cardiovascular:     Rate and Rhythm: Normal rate and regular rhythm.  Pulmonary:     Effort: Pulmonary effort is normal. No respiratory distress.     Breath sounds: Normal breath sounds. No stridor. No decreased breath sounds, wheezing or rales.  Abdominal:     General: Bowel sounds are normal. There is no distension or abdominal bruit.     Palpations: Abdomen is soft.     Tenderness: There is no abdominal tenderness. There is no guarding or rebound.  Musculoskeletal:        General: No tenderness or deformity.     Cervical back: Neck supple.  Skin:    General: Skin is warm and dry.     Findings: No rash.  Neurological:     General: No focal deficit present.     Mental Status: She is alert.     Cranial Nerves: No cranial nerve deficit, dysarthria or facial asymmetry.     Sensory: No sensory deficit.     Motor: No abnormal muscle tone or seizure activity.     Coordination: Coordination normal.  Psychiatric:        Mood and Affect: Mood normal.     ED Results / Procedures / Treatments   Labs (all labs ordered are listed, but only abnormal results are displayed) Labs Reviewed  BASIC METABOLIC PANEL - Abnormal; Notable for the following components:      Result Value   Glucose, Bld 183 (*)    All other components within normal limits  CBC  TROPONIN I (HIGH SENSITIVITY)  TROPONIN I (HIGH SENSITIVITY)    EKG EKG Interpretation  Date/Time:  Friday November 10 2022 11:55:08 EST Ventricular Rate:  95 PR Interval:  184 QRS Duration: 105 QT Interval:  361 QTC Calculation: 454 R Axis:   60 Text Interpretation: Sinus rhythm Borderline T abnormalities, anterior leads No significant change since last tracing Confirmed by Dorie Rank [phone]) on 11/10/2022 11:57:26 AM  Radiology DG Chest Portable 1 View  Result Date: 11/10/2022 CLINICAL DATA:  Chest  pain EXAM: PORTABLE CHEST 1 VIEW COMPARISON:  Chest radiograph 12/24/2021 FINDINGS: Median sternotomy wires are again noted. The heart is at the upper limits of normal for size, unchanged. The upper mediastinal contours are normal There is no focal consolidation or pulmonary edema. There is no pleural effusion or pneumothorax There is no acute osseous abnormality. IMPRESSION: Stable chest with no radiographic evidence of acute cardiopulmonary process. Electronically Signed   By: Collier Salina  Noone M.D.   On: 11/10/2022 12:21    Procedures Procedures    Medications Ordered in ED Medications  ondansetron (ZOFRAN) injection 4 mg (4 mg Intravenous Given 11/10/22 1201)    ED Course/ Medical Decision Making/ A&P Clinical Course as of 11/10/22 1651  Fri Nov 10, 2022  1335 CBC normal [JK]  1335 DG Chest Portable 1 View Chest x-ray without acute findings [JK]  1410 Ischial troponin normal.  CBC and metabolic panel normal. [JK]    Clinical Course User Index [JK] Dorie Rank, MD                             Medical Decision Making Amount and/or Complexity of Data Reviewed Labs: ordered. Radiology: ordered. Decision-making details documented in ED Course.  Risk Prescription drug management.    Pt presented with chest pain.  Moderate risk heart score.  No history of ACS, history of atrial myxoma.  Sx not suggestive of PE.  No dyspnea.  Initial troponin normal.  Plan on delta troponin.  If normal, plan on outpt cardiology referral.  Care turned over to Dr Alvino Chapel at shift change       Final Clinical Impression(s) / ED Diagnoses Final diagnoses:  None    Rx / DC Orders ED Discharge Orders     None         Dorie Rank, MD 11/10/22 1654

## 2022-11-10 NOTE — ED Provider Notes (Signed)
  Physical Exam  BP 123/79   Pulse 100   Temp 98.1 F (36.7 C) (Temporal)   Resp 15   Ht 5' (1.524 m)   Wt 99.8 kg   SpO2 98%   BMI 42.97 kg/m   Physical Exam  Procedures  Procedures  ED Course / MDM   Clinical Course as of 11/10/22 1657  Fri Nov 10, 2022  1335 CBC normal [JK]  1335 DG Chest Portable 1 View Chest x-ray without acute findings [JK]  1410 Ischial troponin normal.  CBC and metabolic panel normal. [JK]    Clinical Course User Index [JK] Dorie Rank, MD   Medical Decision Making Amount and/or Complexity of Data Reviewed Labs: ordered. Radiology: ordered. Decision-making details documented in ED Course.  Risk Prescription drug management.    Patient with chest pain.  Does have previous cardiac surgery.  EKG reassuring.  Troponin negative x 2.  X-ray reassuring.  Discharge home with outpatient cardiology follow-up.      Davonna Belling, MD 11/10/22 [phone]

## 2022-11-13 ENCOUNTER — Other Ambulatory Visit (HOSPITAL_COMMUNITY): Payer: Self-pay

## 2022-11-13 DIAGNOSIS — I10 Essential (primary) hypertension: Secondary | ICD-10-CM | POA: Diagnosis not present

## 2022-11-14 DIAGNOSIS — I10 Essential (primary) hypertension: Secondary | ICD-10-CM | POA: Diagnosis not present

## 2022-11-15 DIAGNOSIS — I10 Essential (primary) hypertension: Secondary | ICD-10-CM | POA: Diagnosis not present

## 2022-11-16 DIAGNOSIS — I10 Essential (primary) hypertension: Secondary | ICD-10-CM | POA: Diagnosis not present

## 2022-11-16 NOTE — Progress Notes (Deleted)
  Cardiology Office Note:   Date:  11/16/2022  ID:  [name], DOB May 15, 1956, MRN [id number]  Primary Cardiologist: None Electrophysiologist: Cristopher Peru, MD   History of Present Illness:   [name] is a 67 y.o. female seen today for routine electrophysiology followup. Since last being seen in our clinic the patient reports doing ***.  she denies chest pain, palpitations, dyspnea, PND, orthopnea, nausea, vomiting, dizziness, syncope, edema, weight gain, or early satiety.   Review of systems complete and found to be negative unless listed in HPI.   Studies Reviewed:    EKG is not ordered today. EKG from 11/10/2022 reviewed which showed NSR at 94 bpm  ***  Risk Assessment/Calculations:   {Does this patient have ATRIAL FIBRILLATION?:[id number]} No BP recorded.  {Refresh Note OR Click here to enter BP  :1}***        Physical Exam:   VS:  There were no vitals taken for this visit.   Wt Readings from Last 3 Encounters:  11/10/22 220 lb 0.3 oz (99.8 kg)  10/19/22 220 lb (99.8 kg)  07/20/22 230 lb (104.3 kg)     GEN: Well nourished, well developed in no acute distress NECK: No JVD; No carotid bruits CARDIAC: {EPRHYTHM:[id number]}, no murmurs, rubs, gallops RESPIRATORY:  Clear to auscultation without rales, wheezing or rhonchi  ABDOMEN: Soft, non-tender, non-distended EXTREMITIES:  No edema; No deformity   ASSESSMENT AND PLAN:   NSVT PVCs EKG last week showed no T wave or ST changes  Continue flecainide 50 mg BID Continue lopressor 25 mg BID  H/o Atypical chest pain Negative coronary CT with calcium score of 0 02/2021 Seen in ED 3/1 for CP, HS trop negative  {Click here to Review PMH, Prob List, Meds, Allergies, SHx, FHx  :1}   Follow up with [id number] {EPFOLLOW [id number]  Signed, Shirley Friar, PA-C

## 2022-11-17 ENCOUNTER — Ambulatory Visit: Payer: Medicaid Other | Admitting: Student

## 2022-11-17 DIAGNOSIS — R079 Chest pain, unspecified: Secondary | ICD-10-CM

## 2022-11-17 DIAGNOSIS — I4729 Other ventricular tachycardia: Secondary | ICD-10-CM

## 2022-11-17 DIAGNOSIS — I493 Ventricular premature depolarization: Secondary | ICD-10-CM

## 2022-11-17 DIAGNOSIS — I10 Essential (primary) hypertension: Secondary | ICD-10-CM | POA: Diagnosis not present

## 2022-11-20 DIAGNOSIS — I10 Essential (primary) hypertension: Secondary | ICD-10-CM | POA: Diagnosis not present

## 2022-11-21 DIAGNOSIS — I10 Essential (primary) hypertension: Secondary | ICD-10-CM | POA: Diagnosis not present

## 2022-11-22 ENCOUNTER — Ambulatory Visit: Payer: Medicaid Other | Admitting: Internal Medicine

## 2022-11-22 DIAGNOSIS — I10 Essential (primary) hypertension: Secondary | ICD-10-CM | POA: Diagnosis not present

## 2022-11-22 NOTE — Progress Notes (Deleted)
Name: [name]  MRN/ DOB: [id number], 1956/04/23    Age/ Sex: 67 y.o., female    PCP: Fenton Foy, NP   Reason for Endocrinology Evaluation: Adrenal Insufficiency     Date of Initial Endocrinology Evaluation: 07/20/2022    HPI: [name] is a 67 y.o. female with a past medical history of Hx of atrial myxoma, Hx of cushing syndrome (S/P adrenalectomy). The patient presented for initial endocrinology clinic visit on 07/20/2022 for consultative assistance with her adrenal insufficiency.   Patient has been referred here for further evaluation of adrenal insufficiency, she had a low serum cortisol at <0.4 ug/dL on 12/24/2021   The patient is s/p bilateral adrenalectomy in 1982 due to [place] syndrome (records not available) Per patient she has been on hydrocortisone replacement therapy since then   Left eye blind   Grandfather with cushing syndrome  Brother with Acromegaly     On her initial visit to our clinic she was on supraphysiologic dose of hydrocortisone, which we have adjusted   SUBJECTIVE:    Today (11/22/22):  [name] is here for follow-up on adrenal insufficiency.   She is on HC 20 mg takes 1 tablet every morning and half in the evening She has insomnia   She has occasional dizziness  Has noted weight gain  Has chronic constipation  She has migraine headaches   Hydrocortisone 10 mg, 1.5 tabs with breakfast and 1 tab in the afternoon   HISTORY:  Past Medical History:  Past Medical History:  Diagnosis Date   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HCC)    Allergy    Amblyopia    Anemia    Arthritis    "in my lower back"   Asthma    Atrial myxoma    Blindness of left eye 1978   Blood transfusion    Breast cyst    Constipation    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HCC)    CVA (cerebral infarction)    Depression    Depression    GERD (gastroesophageal reflux disease)    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[place])    Hemorrhoids    History of open heart  surgery 1977   Hyperlipemia    Hypothyroidism    per office note dated 03/25/2014   Hypoxia    Migraine    Peptic ulcer associated with Helicobacter pylori infection    Reflux    Stroke ([place]) 1978   "piece tumor broke off & went to left eye; leaving me blind"   Past Surgical History:  Past Surgical History:  Procedure Laterality Date   ADRENAL GLAND SURGERY  09/11/1980   on hydrocortisone   ATRIAL MYXOMA EXCISION  09/11/1976   BREAST BIOPSY  07/13/1999   ductal tissue excision & bx right; bx only left breast   COLONOSCOPY     EYE SURGERY     LEFT HEART CATHETERIZATION WITH CORONARY ANGIOGRAM N/A 10/06/2011   Procedure: LEFT HEART CATHETERIZATION WITH CORONARY ANGIOGRAM;  Surgeon: Clent Demark, MD;  Location: [facility];  Service: Cardiovascular;  Laterality: N/A;   ORBITOTOMY Right 07/27/2021   POLYPECTOMY     repaired cross eyed     "as a child"   ROTATOR CUFF REPAIR  05/12/2005   left   VAGINAL HYSTERECTOMY  09/11/1998   "partial; w/left ovary"    Social History:  reports that she has never smoked. She has never used smokeless tobacco. She reports that she does not drink alcohol and does not use drugs. Family  History: family history includes Acromegaly in her father; Colon cancer (age of onset: 18) in her sister; Diabetes in her sister; Heart disease in her daughter, father, and sister; Kidney disease in her sister; Stomach cancer in her father.   HOME MEDICATIONS: Allergies as of 11/22/2022       Reactions   Aspirin Other (See Comments)   Can take baby aspirin (heart flutters with high doses)   Ibuprofen [ibuprofen] Nausea And Vomiting   Penicillins Nausea And Vomiting   Has patient had a PCN reaction causing immediate rash, facial/tongue/throat swelling, SOB or lightheadedness with hypotension: Yes Has patient had a PCN reaction causing severe rash involving mucus membranes or skin necrosis: No Has patient had a PCN reaction that required hospitalization No Has  patient had a PCN reaction occurring within the last 10 years: No If all of the above answers are "NO", then may proceed with Cephalosporin use.        Medication List        Accurate as of November 22, 2022  7:08 AM. If you have any questions, ask your nurse or doctor.          aspirin EC 81 MG tablet Take 81 mg by mouth daily. Swallow whole.   Emgality 120 MG/ML Soaj Generic drug: Galcanezumab-gnlm Inject 120 mg into the skin every 30 (thirty) days.   fenofibrate 48 MG tablet Commonly known as: TRICOR Take 1 tablet (48 mg total) by mouth daily.   flecainide 50 MG tablet Commonly known as: TAMBOCOR Take 1 tablet (50 mg total) by mouth 2 (two) times daily.   hydrocortisone 10 MG tablet Commonly known as: CORTEF Take 1 tablet (10 mg total) by mouth as directed. 15 mg with Breakfast and 10 mg between 2-4 pm daily   hydrOXYzine 10 MG tablet Commonly known as: ATARAX Take 1 tablet (10 mg total) by mouth at bedtime as needed.   levothyroxine 75 MCG tablet Commonly known as: SYNTHROID Take 1 tablet (75 mcg total) by mouth daily.   methocarbamol 500 MG tablet Commonly known as: Robaxin Take 1 tablet (500 mg total) by mouth 4 (four) times daily. What changed:  when to take this reasons to take this   metoprolol tartrate 25 MG tablet Commonly known as: LOPRESSOR Take 1 tablet (25 mg total) by mouth 2 (two) times daily.   nitroGLYCERIN 0.4 MG SL tablet Commonly known as: NITROSTAT Place 1 tablet (0.4 mg total) under the tongue every 5 (five) minutes x 3 doses as needed for chest pain.   Nurtec 75 MG Tbdp Generic drug: Rimegepant Sulfate Take 1 tablet (75 mg total) by mouth daily as needed. For migraines. Take as close to onset of migraine as possible. One daily maximum.   omega-3 acid ethyl esters 1 g capsule Commonly known as: LOVAZA Take 1 capsule (1 g total) by mouth 2 (two) times daily.   omeprazole 20 MG capsule Commonly known as: PRILOSEC Take 20 mg by  mouth 2 (two) times daily.   rosuvastatin 10 MG tablet Commonly known as: CRESTOR Take 1 tablet (10 mg total) by mouth daily.   venlafaxine XR 150 MG 24 hr capsule Commonly known as: EFFEXOR-XR Take 1 capsule (150 mg total) by mouth daily.   Vitamin D (Ergocalciferol) 1.25 MG (50000 UNIT) Caps capsule Commonly known as: DRISDOL Take 1 capsule (50,000 Units total) by mouth every 7 (seven) days.          REVIEW OF SYSTEMS: A comprehensive ROS was conducted with  the patient and is negative except as per HPI    OBJECTIVE:  VS: There were no vitals taken for this visit.   Wt Readings from Last 3 Encounters:  11/10/22 220 lb 0.3 oz (99.8 kg)  10/19/22 220 lb (99.8 kg)  07/20/22 230 lb (104.3 kg)   There is no height or weight on file to calculate BSA.   EXAM: General: Pt appears well and is in NAD  Eyes: External eye exam normal without stare, lid lag or exophthalmos.  EOM intact.  PERRL.  Neck: General: Supple without adenopathy. Thyroid: Thyroid size normal.  No goiter or nodules appreciated.   Lungs: Clear with good BS bilat with no rales, rhonchi, or wheezes  Heart: Auscultation: RRR.  Abdomen: Normoactive bowel sounds, soft, nontender, without masses or organomegaly palpable  Extremities:  BL LE: No pretibial edema normal ROM and strength.  Mental Status: Judgment, insight: Intact Orientation: Oriented to time, place, and person Mood and affect: No depression, anxiety, or agitation     DATA REVIEWED:  Latest Reference Range & Units 07/20/22 13:31  DHEA-SO4 9 - 118 mcg/dL 5 (L)  Cortisol, Plasma ug/dL 10.9     Old records , labs and images have been reviewed.   ASSESSMENT/PLAN/RECOMMENDATIONS:   Adrenal Insufficiency:  - She is S/P bilateral adrenalectomy due to cushing syndrome per pt  -She is on supraphysiologic hydrocortisone replacement -I will reduce hydrocortisone as below -Patient advised to obtain a medical alert bracelet -Sick day will discuss  with the patient -ACTH pending -We discussed pharmacokinetics of hydrocortisone as well as pathophysiology of normal adrenal gland and the effects of adrenalectomy on her body  Medications : Stop hydrocortisone 20 mg Start hydrocortisone 10 mg, 1.5 tabs with breakfast and 1 tablet between 2-4 PM  Follow-up in 4 months   I spent 45 minutes preparing to see the patient by review of recent labs, imaging and procedures, obtaining and reviewing separately obtained history, communicating with the patient/family or caregiver, ordering medications, tests or procedures, and documenting clinical information in the EHR including the differential Dx, treatment, and any further evaluation and other management    Signed electronically by: Mack Guise, MD  [facility]  [facility] [address] [place], [place] [address] Phone: [phone] FAX: [phone]   CC: Fenton Foy, NP [place] [address] [place] [address] Phone: [phone] Fax: [phone]   Return to Endocrinology clinic as below: Future Appointments  Date Time Provider [place]  11/22/2022 11:50 AM Deontez Klinke, Melanie Crazier, MD [facility] None  01/03/2023  8:40 AM Elouise Munroe, MD [facility] None  01/11/2023 10:00 AM Fenton Foy, NP [facility] None  10/25/2023  3:15 PM Frann Rider, NP [facility] None

## 2022-11-23 DIAGNOSIS — I10 Essential (primary) hypertension: Secondary | ICD-10-CM | POA: Diagnosis not present

## 2022-11-24 DIAGNOSIS — I10 Essential (primary) hypertension: Secondary | ICD-10-CM | POA: Diagnosis not present

## 2022-11-27 DIAGNOSIS — I10 Essential (primary) hypertension: Secondary | ICD-10-CM | POA: Diagnosis not present

## 2022-11-28 DIAGNOSIS — I10 Essential (primary) hypertension: Secondary | ICD-10-CM | POA: Diagnosis not present

## 2022-11-29 DIAGNOSIS — I10 Essential (primary) hypertension: Secondary | ICD-10-CM | POA: Diagnosis not present

## 2022-11-30 DIAGNOSIS — I10 Essential (primary) hypertension: Secondary | ICD-10-CM | POA: Diagnosis not present

## 2022-12-01 DIAGNOSIS — I10 Essential (primary) hypertension: Secondary | ICD-10-CM | POA: Diagnosis not present

## 2022-12-04 ENCOUNTER — Emergency Department (HOSPITAL_BASED_OUTPATIENT_CLINIC_OR_DEPARTMENT_OTHER): Payer: Medicaid Other

## 2022-12-04 ENCOUNTER — Other Ambulatory Visit (HOSPITAL_BASED_OUTPATIENT_CLINIC_OR_DEPARTMENT_OTHER): Payer: Self-pay

## 2022-12-04 ENCOUNTER — Encounter (HOSPITAL_BASED_OUTPATIENT_CLINIC_OR_DEPARTMENT_OTHER): Payer: Self-pay | Admitting: Emergency Medicine

## 2022-12-04 ENCOUNTER — Emergency Department (HOSPITAL_BASED_OUTPATIENT_CLINIC_OR_DEPARTMENT_OTHER)
Admission: EM | Admit: 2022-12-04 | Discharge: 2022-12-04 | Disposition: A | Discharge status: Home / Self Care | Payer: Medicaid Other | Attending: Emergency Medicine | Admitting: Emergency Medicine

## 2022-12-04 ENCOUNTER — Other Ambulatory Visit: Payer: Self-pay

## 2022-12-04 DIAGNOSIS — N281 Cyst of kidney, acquired: Secondary | ICD-10-CM | POA: Diagnosis not present

## 2022-12-04 DIAGNOSIS — Z7982 Long term (current) use of aspirin: Secondary | ICD-10-CM | POA: Insufficient documentation

## 2022-12-04 DIAGNOSIS — I10 Essential (primary) hypertension: Secondary | ICD-10-CM | POA: Diagnosis not present

## 2022-12-04 DIAGNOSIS — R1084 Generalized abdominal pain: Secondary | ICD-10-CM | POA: Diagnosis not present

## 2022-12-04 DIAGNOSIS — K5730 Diverticulosis of large intestine without perforation or abscess without bleeding: Secondary | ICD-10-CM | POA: Diagnosis not present

## 2022-12-04 DIAGNOSIS — R1031 Right lower quadrant pain: Secondary | ICD-10-CM | POA: Diagnosis not present

## 2022-12-04 DIAGNOSIS — R109 Unspecified abdominal pain: Secondary | ICD-10-CM

## 2022-12-04 DIAGNOSIS — Z8673 Personal history of transient ischemic attack (TIA), and cerebral infarction without residual deficits: Secondary | ICD-10-CM | POA: Insufficient documentation

## 2022-12-04 LAB — COMPREHENSIVE METABOLIC PANEL
ALT: 11 U/L (ref 0–44)
AST: 19 U/L (ref 15–41)
Albumin: 4.4 g/dL (ref 3.5–5.0)
Alkaline Phosphatase: 54 U/L (ref 38–126)
Anion gap: 9 (ref 5–15)
BUN: 12 mg/dL (ref 8–23)
CO2: 25 mmol/L (ref 22–32)
Calcium: 10.2 mg/dL (ref 8.9–10.3)
Chloride: 101 mmol/L (ref 98–111)
Creatinine, Ser: 0.87 mg/dL (ref 0.44–1.00)
GFR, Estimated: >60 mL/min (ref >60)
Glucose, Bld: 162 mg/dL — ABNORMAL HIGH (ref 70–99)
Potassium: 4 mmol/L (ref 3.5–5.1)
Sodium: 135 mmol/L (ref 135–145)
Total Bilirubin: 0.4 mg/dL (ref 0.3–1.2)
Total Protein: 7.7 g/dL (ref 6.5–8.1)

## 2022-12-04 LAB — CBC WITH DIFFERENTIAL/PLATELET
Abs Immature Granulocytes: 0 10*3/uL (ref 0.00–0.07)
Basophils Absolute: 0 10*3/uL (ref 0.0–0.1)
Basophils Relative: 1 %
Eosinophils Absolute: 0.4 10*3/uL (ref 0.0–0.5)
Eosinophils Relative: 6 %
HCT: 43.2 % (ref 36.0–46.0)
Hemoglobin: 14.2 g/dL (ref 12.0–15.0)
Immature Granulocytes: 0 %
Lymphocytes Relative: 56 %
Lymphs Abs: 3.6 10*3/uL (ref 0.7–4.0)
MCH: 29.3 pg (ref 26.0–34.0)
MCHC: 32.9 g/dL (ref 30.0–36.0)
MCV: 89.3 fL (ref 80.0–100.0)
Monocytes Absolute: 0.5 10*3/uL (ref 0.1–1.0)
Monocytes Relative: 8 %
Neutro Abs: 1.8 10*3/uL (ref 1.7–7.7)
Neutrophils Relative %: 29 %
Platelets: 359 10*3/uL (ref 150–400)
RBC: 4.84 MIL/uL (ref 3.87–5.11)
RDW: 13.9 % (ref 11.5–15.5)
WBC: 6.4 10*3/uL (ref 4.0–10.5)
nRBC: 0 % (ref 0.0–0.2)

## 2022-12-04 LAB — LIPASE, BLOOD: Lipase: 12 U/L (ref 11–51)

## 2022-12-04 MED ORDER — IOHEXOL 300 MG/ML  SOLN
100.0000 mL | Freq: Once | INTRAMUSCULAR | Status: AC | PRN
  Administered 2022-12-04: 85 mL via INTRAVENOUS

## 2022-12-04 MED ORDER — FENTANYL CITRATE PF 50 MCG/ML IJ SOSY
50.0000 ug | PREFILLED_SYRINGE | Freq: Once | INTRAMUSCULAR | Status: AC
  Administered 2022-12-04: 50 ug via INTRAVENOUS
  Filled 2022-12-04: qty 1

## 2022-12-04 MED ORDER — CYCLOBENZAPRINE HCL 10 MG PO TABS: 10.0000 mg | ORAL_TABLET | Freq: Two times a day (BID) | ORAL | 0 refills | Status: DC | PRN

## 2022-12-04 MED ORDER — SODIUM CHLORIDE 0.9 % IV BOLUS
1000.0000 mL | Freq: Once | INTRAVENOUS | Status: AC
  Administered 2022-12-04: 1000 mL via INTRAVENOUS

## 2022-12-04 MED ORDER — ONDANSETRON HCL 4 MG/2ML IJ SOLN
4.0000 mg | Freq: Once | INTRAMUSCULAR | Status: AC
  Administered 2022-12-04: 4 mg via INTRAVENOUS
  Filled 2022-12-04: qty 2

## 2022-12-04 MED ORDER — FENTANYL CITRATE PF 50 MCG/ML IJ SOSY: 50.0000 ug | PREFILLED_SYRINGE | Freq: Once | INTRAMUSCULAR | Status: DC

## 2022-12-04 NOTE — ED Provider Notes (Signed)
[facility] Provider Note   CSN: [id number] Arrival date & time: 12/04/22  [id number]     History  Chief Complaint  Patient presents with   Abdominal Pain    [name] is a 67 y.o. female.  Patient here with abdominal pain for last couple days.  Pain all over left flank and right flank.  She denies any pain with urination.  Denies history of kidney stones.  Nothing makes it worse or better.  She denies any diarrhea.  She has felt nauseous.  No chest pain or shortness of breath.  History of stroke, acid reflux, constipation, peptic ulcer disease  The history is provided by the patient.       Home Medications Prior to Admission medications   Medication Sig Start Date End Date Taking? Authorizing Provider  cyclobenzaprine (FLEXERIL) 10 MG tablet Take 1 tablet (10 mg total) by mouth 2 (two) times daily as needed for muscle spasms. 12/04/22  Yes Evalee Gerard, DO  aspirin EC 81 MG tablet Take 81 mg by mouth daily. Swallow whole.    [provider]  fenofibrate (TRICOR) 48 MG tablet Take 1 tablet (48 mg total) by mouth daily. 11/09/22   Evans Lance, MD  flecainide (TAMBOCOR) 50 MG tablet Take 1 tablet (50 mg total) by mouth 2 (two) times daily. 11/09/22   Evans Lance, MD  Galcanezumab-gnlm [facility]) 120 MG/ML SOAJ Inject 120 mg into the skin every 30 (thirty) days. 10/19/22   Frann Rider, NP  hydrocortisone (CORTEF) 10 MG tablet Take 1 tablet (10 mg total) by mouth as directed. 15 mg with Breakfast and 10 mg between 2-4 pm daily 07/20/22   Shamleffer, Melanie Crazier, MD  hydrOXYzine (ATARAX) 10 MG tablet Take 1 tablet (10 mg total) by mouth at bedtime as needed. 01/04/22   Fenton Foy, NP  levothyroxine (SYNTHROID) 75 MCG tablet Take 1 tablet (75 mcg total) by mouth daily. 02/27/22 02/27/23  Fenton Foy, NP  methocarbamol (ROBAXIN) 500 MG tablet Take 1 tablet (500 mg total) by mouth 4 (four) times daily. Patient  taking differently: Take 500 mg by mouth every 8 (eight) hours as needed for muscle spasms. 04/30/21   Charlesetta Shanks, MD  metoprolol tartrate (LOPRESSOR) 25 MG tablet Take 1 tablet (25 mg total) by mouth 2 (two) times daily. 11/10/22   Davonna Belling, MD  nitroGLYCERIN (NITROSTAT) 0.4 MG SL tablet Place 1 tablet (0.4 mg total) under the tongue every 5 (five) minutes x 3 doses as needed for chest pain. 12/05/20   Charolette Forward, MD  omega-3 acid ethyl esters (LOVAZA) 1 g capsule Take 1 capsule (1 g total) by mouth 2 (two) times daily. 02/08/22 02/08/23  Fenton Foy, NP  omeprazole (PRILOSEC) 20 MG capsule Take 20 mg by mouth 2 (two) times daily. 11/15/20   [provider]  Rimegepant Sulfate (NURTEC) 75 MG TBDP Take 1 tablet (75 mg total) by mouth daily as needed. For migraines. Take as close to onset of migraine as possible. One daily maximum. 10/19/22   Frann Rider, NP  rosuvastatin (CRESTOR) 10 MG tablet Take 1 tablet (10 mg total) by mouth daily. 12/02/21 12/02/22  Vevelyn Francois, NP  venlafaxine XR (EFFEXOR-XR) 150 MG 24 hr capsule Take 1 capsule (150 mg total) by mouth daily. 12/02/21 12/02/22  Vevelyn Francois, NP  Vitamin D, Ergocalciferol, (DRISDOL) 1.25 MG (50000 UNIT) CAPS capsule Take 1 capsule (50,000 Units total) by mouth every 7 (  seven) days. 06/15/22   Fenton Foy, NP      Allergies    Aspirin, Ibuprofen [ibuprofen], and Penicillins    Review of Systems   Review of Systems  Physical Exam Updated Vital Signs BP (!) 100/53 (BP Location: Right Arm)   Pulse 91   Temp 99.7 F (37.6 C) (Oral)   Resp 18   Ht 5' (1.524 m)   Wt 95.3 kg   SpO2 94%   BMI 41.01 kg/m  Physical Exam Vitals and nursing note reviewed.  Constitutional:      General: She is not in acute distress.    Appearance: She is well-developed. She is not ill-appearing.  HENT:     Head: Normocephalic and atraumatic.     Mouth/Throat:     Mouth: Mucous membranes are moist.  Eyes:     Extraocular  Movements: Extraocular movements intact.     Conjunctiva/sclera: Conjunctivae normal.     Pupils: Pupils are equal, round, and reactive to light.  Cardiovascular:     Rate and Rhythm: Normal rate and regular rhythm.     Pulses: Normal pulses.     Heart sounds: Normal heart sounds. No murmur heard. Pulmonary:     Effort: Pulmonary effort is normal. No respiratory distress.     Breath sounds: Normal breath sounds.  Abdominal:     General: Abdomen is flat.     Palpations: Abdomen is soft.     Tenderness: There is generalized abdominal tenderness.  Musculoskeletal:     Cervical back: Neck supple.  Skin:    General: Skin is warm and dry.     Capillary Refill: Capillary refill takes less than 2 seconds.  Neurological:     General: No focal deficit present.     Mental Status: She is alert.     ED Results / Procedures / Treatments   Labs (all labs ordered are listed, but only abnormal results are displayed) Labs Reviewed  COMPREHENSIVE METABOLIC PANEL - Abnormal; Notable for the following components:      Result Value   Glucose, Bld 162 (*)    All other components within normal limits  CBC WITH DIFFERENTIAL/PLATELET  LIPASE, BLOOD    EKG None  Radiology CT ABDOMEN PELVIS W CONTRAST  Result Date: 12/04/2022 CLINICAL DATA:  Abdominal pain EXAM: CT ABDOMEN AND PELVIS WITH CONTRAST TECHNIQUE: Multidetector CT imaging of the abdomen and pelvis was performed using the standard protocol following bolus administration of intravenous contrast. RADIATION DOSE REDUCTION: This exam was performed according to the departmental dose-optimization program which includes automated exposure control, adjustment of the mA and/or kV according to patient size and/or use of iterative reconstruction technique. CONTRAST:  51mL OMNIPAQUE IOHEXOL 300 MG/ML  SOLN COMPARISON:  12/24/2021 FINDINGS: Lower chest: There are metallic wires in the margin of the heart residual from previous intervention. Metallic  sutures are seen in the sternum. No focal infiltrates are seen in lower lung fields. Hepatobiliary: There is fatty infiltration in liver. There are few low-density foci in the liver measuring up to 8 mm in size. Gallbladder is unremarkable. There is no dilation of bile ducts. Pancreas: No focal abnormalities are seen. Spleen: Unremarkable. Adrenals/Urinary Tract: Surgical clips are seen in the region of adrenals. There is no hydronephrosis. There are no renal or ureteral stones. There are smooth marginated low-density lesions in both kidneys measuring up to 1.9 cm in diameter. Density measurements are higher than usual for simple cysts in some of the lesions. Ureters are nondilated. Urinary  bladder is unremarkable. Stomach/Bowel: Stomach is not distended. Small bowel loops are not dilated. Appendix is not dilated. There is no pericecal inflammation. There is no significant wall thickening in colon. Multiple diverticula are seen in colon without signs of focal acute diverticulitis. Vascular/Lymphatic: No acute findings are seen. Reproductive: Uterus is not seen.  There are no adnexal masses. Other: There is no ascites or pneumoperitoneum. Umbilical hernia containing fat is seen. Musculoskeletal: No interval changes are noted in scattered sclerotic foci in bony structures. IMPRESSION: There is no evidence of intestinal obstruction or pneumoperitoneum. There is no hydronephrosis. Appendix is not dilated. Diverticulosis of colon without signs of focal diverticulitis. There are subcentimeter low-density foci in liver, possibly cysts or hemangiomas. There are multiple smoothly marginated low-density lesions in both kidneys, possibly cysts. Some of these lesions show density measurements higher than usual for simple cysts. This finding may suggest presence of hemorrhagic or proteinaceous cysts or enhancing lesions. Follow-up renal sonogram in nonemergent setting may be considered for further characterization.  Electronically Signed   By: Elmer Picker M.D.   On: 12/04/2022 11:56    Procedures Procedures    Medications Ordered in ED Medications  sodium chloride 0.9 % bolus 1,000 mL (1,000 mLs Intravenous New Bag/Given 12/04/22 1139)  fentaNYL (SUBLIMAZE) injection 50 mcg (50 mcg Intravenous Given 12/04/22 1139)  ondansetron (ZOFRAN) injection 4 mg (4 mg Intravenous Given 12/04/22 1139)  iohexol (OMNIPAQUE) 300 MG/ML solution 100 mL (85 mLs Intravenous Contrast Given 12/04/22 1118)    ED Course/ Medical Decision Making/ A&P                             Medical Decision Making Amount and/or Complexity of Data Reviewed Labs: ordered. Radiology: ordered.  Risk Prescription drug management.   [name] is here with abdominal pain.  Normal vitals.  No fever.  Differential diagnosis bowel obstruction versus appendicitis versus colitis versus kidney stone.  Originally she told triage her pain was in the right lower quadrant but now she is having some pain also in the left flank.  She has no history of kidney stones.  No chest pain or shortness of breath.  Will get CBC, CMP, lipase, urinalysis, CT scan abdomen pelvis.  IV fluids, IV fentanyl and IV Zofran to be given.  Per my review and interpretation labs no significant anemia or electrolyte abnormality or kidney injury.  CT scan per radiology report with no acute findings.  Possibly simple cysts on kidneys.  She is made aware that she needs to follow-up with primary care doctor for may be ultrasound to further characterize these.  She is feeling much better.  She did not want to stay to give a urine sample she is not having any pain with urination.  She overall has pretty reproducible left flank pain.  She is not tender in the right lower abdomen anymore.  I suspect that this is a muscular process.  Patient discharged in good condition.  Understands return precautions.  This chart was dictated using voice recognition software.  Despite  best efforts to proofread,  errors can occur which can change the documentation meaning.         Final Clinical Impression(s) / ED Diagnoses Final diagnoses:  Flank pain    Rx / DC Orders ED Discharge Orders          Ordered    cyclobenzaprine (FLEXERIL) 10 MG tablet  2 times daily PRN  12/04/22 [place], [place], DO 12/04/22 1249

## 2022-12-04 NOTE — ED Notes (Signed)
Pt noted to be 78% on RA after medication administration; placed on 2L [place]- O2 improved to 96%.

## 2022-12-04 NOTE — Discharge Instructions (Signed)
Recommend Tylenol 1000 mg every 6 hours as needed for pain.  I have written you for a muscle relaxant.  This medication is sedating so please do not mix with alcohol or drugs or dangerous activities including driving.  Follow-up with your primary care doctor.

## 2022-12-04 NOTE — ED Triage Notes (Signed)
Pt via pov from home with RLQ abdominal pain x 2 days. Pt reports that it worsened today. Pt endorses frequency, denies dysuria or hematuria. Denies fall or injury, but cries out in pain with movement. Denies hx of kidney stones. PT alert & oriented, nad noted.

## 2022-12-05 DIAGNOSIS — I10 Essential (primary) hypertension: Secondary | ICD-10-CM | POA: Diagnosis not present

## 2022-12-07 ENCOUNTER — Telehealth: Payer: Self-pay

## 2022-12-07 DIAGNOSIS — I10 Essential (primary) hypertension: Secondary | ICD-10-CM | POA: Diagnosis not present

## 2022-12-07 NOTE — Telephone Encounter (Signed)
Pa was sent [facility] Key: [id number] Patient was approve 12/07/22 - 12/07/23

## 2022-12-08 DIAGNOSIS — I10 Essential (primary) hypertension: Secondary | ICD-10-CM | POA: Diagnosis not present

## 2022-12-11 ENCOUNTER — Ambulatory Visit (INDEPENDENT_AMBULATORY_CARE_PROVIDER_SITE_OTHER): Payer: Medicaid Other | Admitting: Nurse Practitioner

## 2022-12-11 VITALS — BP 103/60 | HR 70 | Temp 97.0°F | Ht 60.0 in | Wt 216.8 lb

## 2022-12-11 DIAGNOSIS — N289 Disorder of kidney and ureter, unspecified: Secondary | ICD-10-CM

## 2022-12-11 DIAGNOSIS — K769 Liver disease, unspecified: Secondary | ICD-10-CM | POA: Diagnosis not present

## 2022-12-11 DIAGNOSIS — I10 Essential (primary) hypertension: Secondary | ICD-10-CM | POA: Diagnosis not present

## 2022-12-11 DIAGNOSIS — R935 Abnormal findings on diagnostic imaging of other abdominal regions, including retroperitoneum: Secondary | ICD-10-CM | POA: Diagnosis not present

## 2022-12-11 MED ORDER — NITROGLYCERIN 0.4 MG SL SUBL: 0.4000 mg | SUBLINGUAL_TABLET | SUBLINGUAL | 12 refills | Status: DC | PRN

## 2022-12-11 MED ORDER — CYCLOBENZAPRINE HCL 10 MG PO TABS: 10.0000 mg | ORAL_TABLET | Freq: Two times a day (BID) | ORAL | 0 refills | Status: DC | PRN

## 2022-12-11 NOTE — Progress Notes (Signed)
@Patient  ID: [name], female    DOB: 18-Feb-1956, 67 y.o.   MRN: [id number]  Chief Complaint  Patient presents with   Follow-up    Hospital follow up pt is complaining of left side pain.    Referring provider: Ivonne Andrew, NP   HPI   Patient presents today for ED follow-up.  She was seen in the ED on 12/04/2022 for flank pain.  CT scan cyst to kidneys-recommended follow-up with sonogram.  Also showed subcentimeter low-density foci in the liver. Denies f/c/s, n/v/d, hemoptysis, PND, leg swelling Denies chest pain or edema     Allergies  Allergen Reactions   Aspirin Other (See Comments)    Can take baby aspirin (heart flutters with high doses)   Ibuprofen [Ibuprofen] Nausea And Vomiting   Penicillins Nausea And Vomiting    Has patient had a PCN reaction causing immediate rash, facial/tongue/throat swelling, SOB or lightheadedness with hypotension: Yes Has patient had a PCN reaction causing severe rash involving mucus membranes or skin necrosis: No Has patient had a PCN reaction that required hospitalization No Has patient had a PCN reaction occurring within the last 10 years: No If all of the above answers are "NO", then may proceed with Cephalosporin use.     Immunization History  Administered Date(s) Administered   Influenza Split 08/03/2011, 07/18/2015   Influenza,inj,Quad PF,6+ Mos 07/02/2016, 09/27/2018, 05/30/2021, 07/13/2022   Tdap 09/27/2018    Past Medical History:  Diagnosis Date   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HCC)    Allergy    Amblyopia    Anemia    Arthritis    "in my lower back"   Asthma    Atrial myxoma    Blindness of left eye 1978   Blood transfusion    Breast cyst    Constipation    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HCC)    CVA (cerebral infarction)    Depression    Depression    GERD (gastroesophageal reflux disease)    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HCC)    Hemorrhoids    History of open heart surgery 1977   Hyperlipemia    Hypothyroidism     per office note dated 03/25/2014   Hypoxia    Migraine    Peptic ulcer associated with Helicobacter pylori infection    Reflux    Stroke (HCC) 1978   "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 Swallow whole.   fenofibrate (TRICOR) 48 MG tablet Take 1 tablet (48 mg total) by mouth daily.   flecainide (TAMBOCOR) 50 MG tablet Take 1 tablet (50 mg total) by mouth 2 (two) times daily.   Galcanezumab-gnlm (EMGALITY) 120 MG/ML SOAJ Inject 120 mg into the skin every 30 (thirty) days.   hydrocortisone (CORTEF) 10 MG tablet Take 1 tablet (10 mg total) by mouth as directed. 15 mg with Breakfast and 10 mg between 2-4 pm daily   hydrOXYzine (ATARAX) 10 MG tablet Take 1 tablet (10 mg total) by mouth at bedtime as needed.   levothyroxine (SYNTHROID) 75 MCG tablet Take 1 tablet (75 mcg total) by mouth daily.   methocarbamol (ROBAXIN) 500 MG tablet Take 1 tablet (500 mg total) by mouth 4 (four) times daily. (Patient taking  differently: Take 500 mg by mouth every 8 (eight) hours as needed for muscle spasms.)   metoprolol tartrate (LOPRESSOR) 25 MG tablet Take 1 tablet (25 mg total) by mouth 2 (two) times daily.   omega-3 acid ethyl esters (LOVAZA) 1 g capsule Take 1 capsule (1 g total) by mouth 2 (two) times daily.   omeprazole (PRILOSEC) 20 MG capsule Take 20 mg by mouth 2 (two) times daily.   Rimegepant Sulfate (NURTEC) 75 MG TBDP Take 1 tablet (75 mg total) by mouth daily as needed. For migraines. Take as close to onset of migraine as possible. One daily maximum.   venlafaxine XR (EFFEXOR-XR) 150 MG 24 hr capsule Take 1 capsule (150 mg total) by mouth daily.   [DISCONTINUED] cyclobenzaprine (FLEXERIL) 10 MG tablet Take 1 tablet (10 mg  total) by mouth 2 (two) times daily as needed for muscle spasms.   [DISCONTINUED] nitroGLYCERIN (NITROSTAT) 0.4 MG SL tablet Place 1 tablet (0.4 mg total) under the tongue every 5 (five) minutes x 3 doses as needed for chest pain.   [DISCONTINUED] rosuvastatin (CRESTOR) 10 MG tablet Take 1 tablet (10 mg total) by mouth daily.   [DISCONTINUED] Vitamin D, Ergocalciferol, (DRISDOL) 1.25 MG (50000 UNIT) CAPS capsule Take 1 capsule (50,000 Units total) by mouth every 7 (seven) days.   cyclobenzaprine (FLEXERIL) 10 MG tablet Take 1 tablet (10 mg total) by mouth 2 (two) times daily as needed for muscle spasms.   nitroGLYCERIN (NITROSTAT) 0.4 MG SL tablet Place 1 tablet (0.4 mg total) under the tongue every 5 (five) minutes x 3 doses as needed for chest pain.   No facility-administered encounter medications on file as of 12/11/2022.     Review of Systems  Review of Systems  Constitutional: Negative.   HENT: Negative.    Cardiovascular: Negative.   Gastrointestinal: Negative.   Allergic/Immunologic: Negative.   Neurological: Negative.   Psychiatric/Behavioral: Negative.         Physical Exam  BP 103/60   Pulse 70   Temp (!) 97 F (36.1 C)   Ht 5' (1.524 m)   Wt 216 lb 12.8 oz (98.3 kg)   SpO2 99%   BMI 42.34 kg/m   Wt Readings from Last 5 Encounters:  12/11/22 216 lb 12.8 oz (98.3 kg)  12/04/22 210 lb (95.3 kg)  11/10/22 220 lb 0.3 oz (99.8 kg)  10/19/22 220 lb (99.8 kg)  07/20/22 230 lb (104.3 kg)     Physical Exam Vitals and nursing note reviewed.  Constitutional:      General: She is not in acute distress.    Appearance: She is well-developed.  Cardiovascular:     Rate and Rhythm: Normal rate and regular rhythm.  Pulmonary:     Effort: Pulmonary effort is normal.     Breath sounds: Normal breath sounds.  Neurological:     Mental Status: She is alert and oriented to person, place, and time.      Lab Results:  CBC    Component Value Date/Time   WBC 6.4  12/04/2022 1010   RBC 4.84 12/04/2022 1010   HGB 14.2 12/04/2022 1010   HGB 13.4 07/13/2022 0858   HCT 43.2 12/04/2022 1010   HCT 41.4 07/13/2022 0858   PLT 359 12/04/2022 1010   PLT 323 07/13/2022 0858   MCV 89.3 12/04/2022 1010   MCV 92 07/13/2022 0858   MCH 29.3 12/04/2022 1010   MCHC 32.9 12/04/2022 1010   RDW 13.9 12/04/2022 1010   RDW 13.3 07/13/2022  0858   LYMPHSABS 3.6 12/04/2022 1010   LYMPHSABS 2.7 11/25/2020 1350   MONOABS 0.5 12/04/2022 1010   EOSABS 0.4 12/04/2022 1010   EOSABS 0.4 11/25/2020 1350   BASOSABS 0.0 12/04/2022 1010   BASOSABS 0.0 11/25/2020 1350    BMET    Component Value Date/Time   NA 135 12/04/2022 1010   NA 137 07/13/2022 0858   K 4.0 12/04/2022 1010   CL 101 12/04/2022 1010   CO2 25 12/04/2022 1010   GLUCOSE 162 (H) 12/04/2022 1010   BUN 12 12/04/2022 1010   BUN 15 07/13/2022 0858   CREATININE 0.87 12/04/2022 1010   CALCIUM 10.2 12/04/2022 1010   GFRNONAA >60 12/04/2022 1010   GFRAA 83 05/25/2020 1234    BNP No results found for: "BNP"  ProBNP No results found for: "PROBNP"  Imaging: CT ABDOMEN PELVIS W CONTRAST  Result Date: 12/04/2022 CLINICAL DATA:  Abdominal pain EXAM: CT ABDOMEN AND PELVIS WITH CONTRAST TECHNIQUE: Multidetector CT imaging of the abdomen and pelvis was performed using the standard protocol following bolus administration of intravenous contrast. RADIATION DOSE REDUCTION: This exam was performed according to the departmental dose-optimization program which includes automated exposure control, adjustment of the mA and/or kV according to patient size and/or use of iterative reconstruction technique. CONTRAST:  85mL OMNIPAQUE IOHEXOL 300 MG/ML  SOLN COMPARISON:  12/24/2021 FINDINGS: Lower chest: There are metallic wires in the margin of the heart residual from previous intervention. Metallic sutures are seen in the sternum. No focal infiltrates are seen in lower lung fields. Hepatobiliary: There is fatty infiltration in  liver. There are few low-density foci in the liver measuring up to 8 mm in size. Gallbladder is unremarkable. There is no dilation of bile ducts. Pancreas: No focal abnormalities are seen. Spleen: Unremarkable. Adrenals/Urinary Tract: Surgical clips are seen in the region of adrenals. There is no hydronephrosis. There are no renal or ureteral stones. There are smooth marginated low-density lesions in both kidneys measuring up to 1.9 cm in diameter. Density measurements are higher than usual for simple cysts in some of the lesions. Ureters are nondilated. Urinary bladder is unremarkable. Stomach/Bowel: Stomach is not distended. Small bowel loops are not dilated. Appendix is not dilated. There is no pericecal inflammation. There is no significant wall thickening in colon. Multiple diverticula are seen in colon without signs of focal acute diverticulitis. Vascular/Lymphatic: No acute findings are seen. Reproductive: Uterus is not seen.  There are no adnexal masses. Other: There is no ascites or pneumoperitoneum. Umbilical hernia containing fat is seen. Musculoskeletal: No interval changes are noted in scattered sclerotic foci in bony structures. IMPRESSION: There is no evidence of intestinal obstruction or pneumoperitoneum. There is no hydronephrosis. Appendix is not dilated. Diverticulosis of colon without signs of focal diverticulitis. There are subcentimeter low-density foci in liver, possibly cysts or hemangiomas. There are multiple smoothly marginated low-density lesions in both kidneys, possibly cysts. Some of these lesions show density measurements higher than usual for simple cysts. This finding may suggest presence of hemorrhagic or proteinaceous cysts or enhancing lesions. Follow-up renal sonogram in nonemergent setting may be considered for further characterization. Electronically Signed   By: Ernie Avena M.D.   On: 12/04/2022 11:56     Assessment & Plan:   Abnormal CT of the abdomen - US  Renal; Future - Ambulatory referral to Gastroenterology  2. Kidney lesion, native, bilateral  - US Renal; Future  -scheduled for renal US on 12/15/22 at 1:30  3. Liver lesion  - Ambulatory  referral to Gastroenterology    Follow up:  Follow up in 3 months     Ivonne Andrew, NP 01/01/2023

## 2022-12-11 NOTE — Patient Instructions (Addendum)
1. Abnormal CT of the abdomen  - US Renal; Future - Ambulatory referral to Gastroenterology  2. Kidney lesion, native, bilateral  - US Renal; Future  -scheduled for renal US on 12/15/22 at 1:30  3. Liver lesion  - Ambulatory referral to Gastroenterology    Follow up:  Follow up in 3 months

## 2022-12-12 DIAGNOSIS — I10 Essential (primary) hypertension: Secondary | ICD-10-CM | POA: Diagnosis not present

## 2022-12-13 DIAGNOSIS — I10 Essential (primary) hypertension: Secondary | ICD-10-CM | POA: Diagnosis not present

## 2022-12-14 ENCOUNTER — Other Ambulatory Visit: Payer: Self-pay | Admitting: Nurse Practitioner

## 2022-12-14 DIAGNOSIS — I10 Essential (primary) hypertension: Secondary | ICD-10-CM | POA: Diagnosis not present

## 2022-12-15 ENCOUNTER — Ambulatory Visit (HOSPITAL_COMMUNITY): Admission: RE | Admit: 2022-12-15 | Payer: Medicaid Other | Source: Ambulatory Visit

## 2022-12-15 DIAGNOSIS — I10 Essential (primary) hypertension: Secondary | ICD-10-CM | POA: Diagnosis not present

## 2022-12-18 ENCOUNTER — Telehealth: Payer: Self-pay

## 2022-12-18 DIAGNOSIS — I10 Essential (primary) hypertension: Secondary | ICD-10-CM | POA: Diagnosis not present

## 2022-12-19 DIAGNOSIS — I10 Essential (primary) hypertension: Secondary | ICD-10-CM | POA: Diagnosis not present

## 2022-12-20 ENCOUNTER — Other Ambulatory Visit: Payer: Self-pay | Admitting: Nurse Practitioner

## 2022-12-20 DIAGNOSIS — E559 Vitamin D deficiency, unspecified: Secondary | ICD-10-CM

## 2022-12-20 DIAGNOSIS — I10 Essential (primary) hypertension: Secondary | ICD-10-CM | POA: Diagnosis not present

## 2022-12-20 MED ORDER — VITAMIN D (ERGOCALCIFEROL) 1.25 MG (50000 UNIT) PO CAPS: 50000.0000 [IU] | ORAL_CAPSULE | ORAL | 1 refills | Status: DC

## 2022-12-21 DIAGNOSIS — I10 Essential (primary) hypertension: Secondary | ICD-10-CM | POA: Diagnosis not present

## 2022-12-22 DIAGNOSIS — I10 Essential (primary) hypertension: Secondary | ICD-10-CM | POA: Diagnosis not present

## 2022-12-25 DIAGNOSIS — I10 Essential (primary) hypertension: Secondary | ICD-10-CM | POA: Diagnosis not present

## 2022-12-26 DIAGNOSIS — I10 Essential (primary) hypertension: Secondary | ICD-10-CM | POA: Diagnosis not present

## 2022-12-27 DIAGNOSIS — I10 Essential (primary) hypertension: Secondary | ICD-10-CM | POA: Diagnosis not present

## 2022-12-27 NOTE — Telephone Encounter (Signed)
Done

## 2022-12-28 DIAGNOSIS — I10 Essential (primary) hypertension: Secondary | ICD-10-CM | POA: Diagnosis not present

## 2022-12-28 NOTE — Telephone Encounter (Signed)
Called and spoke with patient's son Thayer Ohm. He verbalized understanding and is ok with the home sleep study. Order has been placed.   Nothing further needed at time of call.

## 2022-12-28 NOTE — Telephone Encounter (Signed)
Dr. Val Eagle, please advise on what we need to get done for this pt.

## 2022-12-28 NOTE — Telephone Encounter (Signed)
Let us schedule for home sleep test since a an in lab study was denied

## 2022-12-29 DIAGNOSIS — I10 Essential (primary) hypertension: Secondary | ICD-10-CM | POA: Diagnosis not present

## 2023-01-01 ENCOUNTER — Other Ambulatory Visit: Payer: Self-pay | Admitting: Internal Medicine

## 2023-01-01 DIAGNOSIS — E781 Pure hyperglyceridemia: Secondary | ICD-10-CM

## 2023-01-01 DIAGNOSIS — I10 Essential (primary) hypertension: Secondary | ICD-10-CM | POA: Diagnosis not present

## 2023-01-01 NOTE — Assessment & Plan Note (Signed)
-   US Renal; Future - Ambulatory referral to Gastroenterology  2. Kidney lesion, native, bilateral  - US Renal; Future  -scheduled for renal US on 12/15/22 at 1:30  3. Liver lesion  - Ambulatory referral to Gastroenterology    Follow up:  Follow up in 3 months

## 2023-01-02 DIAGNOSIS — I10 Essential (primary) hypertension: Secondary | ICD-10-CM | POA: Diagnosis not present

## 2023-01-03 ENCOUNTER — Ambulatory Visit: Payer: Medicaid Other | Attending: Internal Medicine | Admitting: Internal Medicine

## 2023-01-03 DIAGNOSIS — I10 Essential (primary) hypertension: Secondary | ICD-10-CM | POA: Diagnosis not present

## 2023-01-04 DIAGNOSIS — I10 Essential (primary) hypertension: Secondary | ICD-10-CM | POA: Diagnosis not present

## 2023-01-05 DIAGNOSIS — I10 Essential (primary) hypertension: Secondary | ICD-10-CM | POA: Diagnosis not present

## 2023-01-10 DIAGNOSIS — I10 Essential (primary) hypertension: Secondary | ICD-10-CM | POA: Diagnosis not present

## 2023-01-11 ENCOUNTER — Ambulatory Visit: Payer: Medicaid Other | Admitting: Nurse Practitioner

## 2023-01-11 DIAGNOSIS — I10 Essential (primary) hypertension: Secondary | ICD-10-CM | POA: Diagnosis not present

## 2023-01-12 DIAGNOSIS — I10 Essential (primary) hypertension: Secondary | ICD-10-CM | POA: Diagnosis not present

## 2023-01-15 DIAGNOSIS — I10 Essential (primary) hypertension: Secondary | ICD-10-CM | POA: Diagnosis not present

## 2023-01-16 DIAGNOSIS — I10 Essential (primary) hypertension: Secondary | ICD-10-CM | POA: Diagnosis not present

## 2023-01-22 ENCOUNTER — Other Ambulatory Visit: Payer: Self-pay | Admitting: Nurse Practitioner

## 2023-01-22 DIAGNOSIS — E559 Vitamin D deficiency, unspecified: Secondary | ICD-10-CM

## 2023-01-23 NOTE — Telephone Encounter (Signed)
Please advise if you would like pt to continue the vitamin d. KH 

## 2023-01-26 ENCOUNTER — Other Ambulatory Visit: Payer: Self-pay | Admitting: Internal Medicine

## 2023-01-26 DIAGNOSIS — E781 Pure hyperglyceridemia: Secondary | ICD-10-CM

## 2023-01-27 ENCOUNTER — Encounter (HOSPITAL_BASED_OUTPATIENT_CLINIC_OR_DEPARTMENT_OTHER): Payer: Self-pay | Admitting: Emergency Medicine

## 2023-01-27 ENCOUNTER — Other Ambulatory Visit: Payer: Self-pay

## 2023-01-27 ENCOUNTER — Emergency Department (HOSPITAL_BASED_OUTPATIENT_CLINIC_OR_DEPARTMENT_OTHER)
Admission: EM | Admit: 2023-01-27 | Discharge: 2023-01-27 | Disposition: A | Discharge status: Home / Self Care | Payer: Medicaid Other | Attending: Emergency Medicine | Admitting: Emergency Medicine

## 2023-01-27 ENCOUNTER — Other Ambulatory Visit (HOSPITAL_BASED_OUTPATIENT_CLINIC_OR_DEPARTMENT_OTHER): Payer: Self-pay

## 2023-01-27 DIAGNOSIS — R062 Wheezing: Secondary | ICD-10-CM | POA: Diagnosis not present

## 2023-01-27 DIAGNOSIS — M79602 Pain in left arm: Secondary | ICD-10-CM | POA: Diagnosis not present

## 2023-01-27 DIAGNOSIS — Z7982 Long term (current) use of aspirin: Secondary | ICD-10-CM | POA: Insufficient documentation

## 2023-01-27 DIAGNOSIS — Z8673 Personal history of transient ischemic attack (TIA), and cerebral infarction without residual deficits: Secondary | ICD-10-CM | POA: Diagnosis not present

## 2023-01-27 MED ORDER — IPRATROPIUM-ALBUTEROL 0.5-2.5 (3) MG/3ML IN SOLN
RESPIRATORY_TRACT | Status: AC
  Administered 2023-01-27: 3 mL via RESPIRATORY_TRACT
  Filled 2023-01-27: qty 3

## 2023-01-27 MED ORDER — AEROCHAMBER PLUS FLO-VU MISC
1.0000 | Freq: Once | Status: AC
  Administered 2023-01-27: 1
  Filled 2023-01-27: qty 1

## 2023-01-27 MED ORDER — METHOCARBAMOL 500 MG PO TABS
500.0000 mg | ORAL_TABLET | Freq: Once | ORAL | Status: AC
  Administered 2023-01-27: 500 mg via ORAL
  Filled 2023-01-27: qty 1

## 2023-01-27 MED ORDER — HYDROCODONE-ACETAMINOPHEN 5-325 MG PO TABS
1.0000 | ORAL_TABLET | Freq: Four times a day (QID) | ORAL | 0 refills | Status: DC | PRN
  Filled 2023-01-27: qty 6, 2d supply, fill #0

## 2023-01-27 MED ORDER — METHOCARBAMOL 500 MG PO TABS
1000.0000 mg | ORAL_TABLET | Freq: Three times a day (TID) | ORAL | 0 refills | Status: DC | PRN
  Filled 2023-01-27: qty 30, 5d supply, fill #0

## 2023-01-27 MED ORDER — IPRATROPIUM-ALBUTEROL 0.5-2.5 (3) MG/3ML IN SOLN: 3.0000 mL | Freq: Once | RESPIRATORY_TRACT | Status: AC

## 2023-01-27 MED ORDER — ALUM & MAG HYDROXIDE-SIMETH 200-200-20 MG/5ML PO SUSP
30.0000 mL | Freq: Once | ORAL | Status: AC
  Administered 2023-01-27: 30 mL via ORAL
  Filled 2023-01-27: qty 30

## 2023-01-27 MED ORDER — OMEPRAZOLE 20 MG PO CPDR
20.0000 mg | DELAYED_RELEASE_CAPSULE | Freq: Every day | ORAL | 0 refills | Status: DC
  Filled 2023-01-27: qty 30, 30d supply, fill #0

## 2023-01-27 MED ORDER — ALBUTEROL SULFATE HFA 108 (90 BASE) MCG/ACT IN AERS
2.0000 | INHALATION_SPRAY | RESPIRATORY_TRACT | Status: DC | PRN
  Administered 2023-01-27: 2 via RESPIRATORY_TRACT
  Filled 2023-01-27: qty 6.7

## 2023-01-27 MED ORDER — PANTOPRAZOLE SODIUM 40 MG PO TBEC
40.0000 mg | DELAYED_RELEASE_TABLET | Freq: Every day | ORAL | Status: DC
  Administered 2023-01-27: 40 mg via ORAL
  Filled 2023-01-27: qty 1

## 2023-01-27 MED ORDER — HYDROCODONE-ACETAMINOPHEN 5-325 MG PO TABS
1.0000 | ORAL_TABLET | Freq: Once | ORAL | Status: AC
  Administered 2023-01-27: 1 via ORAL
  Filled 2023-01-27: qty 1

## 2023-01-27 NOTE — ED Notes (Signed)
Pt resting; reports improved pain from 10-3. Family at bedside, call light in reach.

## 2023-01-27 NOTE — ED Provider Notes (Signed)
[place] EMERGENCY DEPARTMENT AT [facility] Provider Note   CSN: [id number] Arrival date & time: 01/27/23  0827     History  Chief Complaint  Patient presents with   left arm pain    [name] is a 67 y.o. female.  Patient with history of left humerus fracture in 2006, irregular heartbeat, previous stroke --presents to the emergency department today for evaluation of left arm pain.  Symptoms began yesterday.  No new falls or injuries.  She states that the pain started in the shoulder and has now moved down to the hand and fingers.  Pain is described as a shooting pain.  It is worse with any movement or palpation.  No significant neck pain.  She denies lower extremity pain or weakness.  No swelling of the arm.  No associated erythema.  No chest pain or shortness of breath.  No fever or cough.       Home Medications Prior to Admission medications   Medication Sig Start Date End Date Taking? Authorizing Provider  aspirin EC 81 MG tablet Take 81 mg by mouth daily. Swallow whole.    [provider]  cyclobenzaprine (FLEXERIL) 10 MG tablet Take 1 tablet (10 mg total) by mouth 2 (two) times daily as needed for muscle spasms. 12/11/22   Ivonne Andrew, NP  fenofibrate (TRICOR) 48 MG tablet TAKE ONE TABLET BY MOUTH EVERY DAY 01/26/23   Marinus Maw, MD  flecainide (TAMBOCOR) 50 MG tablet Take 1 tablet (50 mg total) by mouth 2 (two) times daily. 11/09/22   Marinus Maw, MD  Galcanezumab-gnlm [facility]) 120 MG/ML SOAJ Inject 120 mg into the skin every 30 (thirty) days. 10/19/22   Ihor Austin, NP  hydrocortisone (CORTEF) 10 MG tablet Take 1 tablet (10 mg total) by mouth as directed. 15 mg with Breakfast and 10 mg between 2-4 pm daily 07/20/22   Shamleffer, Konrad Dolores, MD  hydrOXYzine (ATARAX) 10 MG tablet Take 1 tablet (10 mg total) by mouth at bedtime as needed. 01/04/22   Ivonne Andrew, NP  levothyroxine (SYNTHROID) 75 MCG tablet Take 1 tablet (75 mcg  total) by mouth daily. 02/27/22 02/27/23  Ivonne Andrew, NP  methocarbamol (ROBAXIN) 500 MG tablet Take 1 tablet (500 mg total) by mouth 4 (four) times daily. Patient taking differently: Take 500 mg by mouth every 8 (eight) hours as needed for muscle spasms. 04/30/21   Arby Barrette, MD  metoprolol tartrate (LOPRESSOR) 25 MG tablet Take 1 tablet (25 mg total) by mouth 2 (two) times daily. 11/10/22   Benjiman Core, MD  nitroGLYCERIN (NITROSTAT) 0.4 MG SL tablet Place 1 tablet (0.4 mg total) under the tongue every 5 (five) minutes x 3 doses as needed for chest pain. 12/11/22   Ivonne Andrew, NP  omega-3 acid ethyl esters (LOVAZA) 1 g capsule Take 1 capsule (1 g total) by mouth 2 (two) times daily. 02/08/22 02/08/23  Ivonne Andrew, NP  omeprazole (PRILOSEC) 20 MG capsule Take 20 mg by mouth 2 (two) times daily. 11/15/20   [provider]  Rimegepant Sulfate (NURTEC) 75 MG TBDP Take 1 tablet (75 mg total) by mouth daily as needed. For migraines. Take as close to onset of migraine as possible. One daily maximum. 10/19/22   Ihor Austin, NP  rosuvastatin (CRESTOR) 10 MG tablet TAKE ONE TABLET BY MOUTH DAILY 12/14/22   Ivonne Andrew, NP  venlafaxine XR (EFFEXOR-XR) 150 MG 24 hr capsule Take 1 capsule (150 mg  total) by mouth daily. 12/02/21 12/11/22  Barbette Merino, NP  Vitamin D, Ergocalciferol, (DRISDOL) 1.25 MG (50000 UNIT) CAPS capsule Take 1 capsule (50,000 Units total) by mouth every 7 (seven) days. 01/23/23   Ivonne Andrew, NP      Allergies    Aspirin, Ibuprofen [ibuprofen], and Penicillins    Review of Systems   Review of Systems  Physical Exam Updated Vital Signs BP 121/66   Pulse 100   Temp 99.2 F (37.3 C) (Oral)   Resp 20   SpO2 94%  Physical Exam Vitals and nursing note reviewed.  Constitutional:      General: She is not in acute distress.    Appearance: She is well-developed.  HENT:     Head: Normocephalic and atraumatic.     Right Ear: External ear normal.      Left Ear: External ear normal.     Nose: Nose normal.  Eyes:     Conjunctiva/sclera: Conjunctivae normal.  Cardiovascular:     Rate and Rhythm: Normal rate and regular rhythm.     Heart sounds: No murmur heard. Pulmonary:     Effort: No respiratory distress.     Breath sounds: Wheezing present. No rhonchi or rales.     Comments: Scattered expiratory wheezing Abdominal:     Palpations: Abdomen is soft.     Tenderness: There is no abdominal tenderness. There is no guarding or rebound.  Musculoskeletal:     Left shoulder: Tenderness present. Decreased range of motion.     Left upper arm: Tenderness present.     Left elbow: Decreased range of motion. Tenderness present.     Left forearm: Tenderness present.     Left wrist: Tenderness present. Decreased range of motion.     Cervical back: Normal range of motion and neck supple. No tenderness. Normal range of motion.     Right lower leg: No edema.     Left lower leg: No edema.     Comments: Patient has 2+ radial pulse on the left arm, she is able to weakly grip my fingers.  She is guarding her left arm.  I am able to passively range her wrist, elbow and shoulder but she winces in pain and asks me not to move the arm.  She is tender to palpation of the forearm and upper arm without overt swelling.  Compartments are soft.  No erythema or signs of cellulitis or other rash.  Skin:    General: Skin is warm and dry.     Findings: No rash.  Neurological:     General: No focal deficit present.     Mental Status: She is alert. Mental status is at baseline.     Motor: No weakness.  Psychiatric:        Mood and Affect: Mood normal.     ED Results / Procedures / Treatments   Labs (all labs ordered are listed, but only abnormal results are displayed) Labs Reviewed - No data to display  ED ECG REPORT   Date: 01/27/2023  Rate: 101  Rhythm: sinus tachycardia  QRS Axis: normal  Intervals: QT prolonged  ST/T Wave abnormalities: nonspecific T  wave changes  Conduction Disutrbances:none  Narrative Interpretation:   Old EKG Reviewed: changes noted, qt longer today compared to 11/10/22  I have personally reviewed the EKG tracing and agree with the computerized printout as noted.   Radiology No results found.  Procedures Procedures    Medications Ordered in ED Medications  albuterol (VENTOLIN HFA) 108 (90 Base) MCG/ACT inhaler 2 puff (2 puffs Inhalation Given 01/27/23 0936)  pantoprazole (PROTONIX) EC tablet 40 mg (40 mg Oral Given 01/27/23 1021)  ipratropium-albuterol (DUONEB) 0.5-2.5 (3) MG/3ML nebulizer solution 3 mL (3 mLs Nebulization Given 01/27/23 0924)  HYDROcodone-acetaminophen (NORCO/VICODIN) 5-325 MG per tablet 1 tablet (1 tablet Oral Given 01/27/23 1021)  methocarbamol (ROBAXIN) tablet 500 mg (500 mg Oral Given 01/27/23 1022)  aerochamber plus with mask device 1 each (1 each Other Given 01/27/23 0936)  alum & mag hydroxide-simeth (MAALOX/MYLANTA) 200-200-20 MG/5ML suspension 30 mL (30 mLs Oral Given 01/27/23 1021)    ED Course/ Medical Decision Making/ A&P    Patient seen and examined. History obtained directly from patient.   Labs/EKG: None ordered.  Will get EKG, due to risk factors, however symptoms today are likely musculoskeletal or neuropathic in nature.  I am not concerned for referred pain from the chest given exam at this time.  Imaging: None ordered  Medications/Fluids: Ordered: DuoNeb, Vicodin, Robaxin, sling.   Most recent vital signs reviewed and are as follows: BP 121/66   Pulse 100   Temp 99.2 F (37.3 C) (Oral)   Resp 20   SpO2 94%   Initial impression: Left upper extremity pain likely MSK or potentially neuropathic.  The upper extremity is neurovascularly intact.  I am not concerned about an arterial problem or DVT at this time based on exam.  Will treat symptomatically.  I considered prednisone, however patient has a history of renal insufficiency and is on hydrocortisone already so we will  avoid this.  I did ask the patient if she would like a breathing treatment for her wheezing and she states yes.  She is also requesting a refill of omeprazole because "my ulcers are killing me".  11:27 AM Reassessment performed. Patient appears more comfortable now.  She is able to range her left arm better.  Maalox given here helped with epigastric pain.  Also her breathing is better after albuterol treatment.  She will be given an inhaler for home.  Labs personally reviewed and interpreted including: EKG personally reviewed as above.   Reviewed pertinent lab work and imaging with patient at bedside. Questions answered.   Most current vital signs reviewed and are as follows: BP 121/66   Pulse 100   Temp 99.2 F (37.3 C) (Oral)   Resp 20   SpO2 94%   Plan: Discharge to home.   Prescriptions written for: Omeprazole per patient request, Robaxin and Vicodin # 6 tablets for arm pain.  Patient counseled on use of narcotic pain and muscle relaxer medications. Counseled not to combine these medications with others containing tylenol. Urged not to drink alcohol, drive, or perform any other activities that requires focus while taking these medications. The patient verbalizes understanding and agrees with the plan.  Other home care instructions discussed: Use of sling for comfort, gentle stretching, no strenuous use of left arm.  ED return instructions discussed: New or worsening symptoms  Follow-up instructions discussed: Patient encouraged to follow-up with their PCP in 3 days.                               Medical Decision Making Risk OTC drugs. Prescription drug management.   Patient's primary problem today was left arm pain.  Patient is very tender to palpation and pain also worse with movement.  She also describes a pain that radiates down her arm from  her shoulder that is throbbing in nature, possibly radicular.  No neck pain.  No lower extremity symptoms to suggest central  cord problem.  I do not suspect DVT of the upper extremity, arterial insufficiency, cellulitis, shingles or other infectious etiology.  No focal joint swelling to suggest effusion or septic arthritis.  She also reports some epigastric pain that she relates to GERD.  This was improved with Maalox.  Will restart on omeprazole per patient request.  Low concern for ACS, PE.  EKG without ischemic findings.  Patient also with wheezing improved with albuterol treatment here.  Low concern for pneumonia as she has not had fever or significant cough.  She will be given a albuterol inhaler for home to use as needed.  The patient's vital signs, pertinent lab work and imaging were reviewed and interpreted as discussed in the ED course. Hospitalization was considered for further testing, treatments, or serial exams/observation. However as patient is well-appearing, has a stable exam, and reassuring studies today, I do not feel that they warrant admission at this time. This plan was discussed with the patient who verbalizes agreement and comfort with this plan and seems reliable and able to return to the Emergency Department with worsening or changing symptoms.           Final Clinical Impression(s) / ED Diagnoses Final diagnoses:  Left arm pain  Wheezing    Rx / DC Orders ED Discharge Orders          Ordered    omeprazole (PRILOSEC) 20 MG capsule  Daily        01/27/23 1124    methocarbamol (ROBAXIN) 500 MG tablet  Every 8 hours PRN        01/27/23 1124    HYDROcodone-acetaminophen (NORCO/VICODIN) 5-325 MG tablet  Every 6 hours PRN        01/27/23 1124              Renne Crigler, PA-C 01/27/23 1130    Tegeler, Canary Brim, MD 01/27/23 [phone]

## 2023-01-27 NOTE — ED Notes (Signed)
Pt is out of prilosec ,asking if she could have prescrition til can see MD

## 2023-01-27 NOTE — ED Notes (Signed)
Pt received AVS; educated on pain management and prescriptions medications;no further questions upon discharge.

## 2023-01-27 NOTE — Discharge Instructions (Addendum)
Please read and follow all provided instructions.  Your diagnoses today include:  1. Left arm pain   2. Wheezing     Tests performed today include: EKG: Showed no problems with heart rhythm Vital signs. See below for your results today.   Medications prescribed:  Omeprazole (Prilosec) - stomach acid reducer  This medication can be found over-the-counter  Robaxin (methocarbamol) - muscle relaxer medication  DO NOT drive or perform any activities that require you to be awake and alert because this medicine can make you drowsy.   Vicodin (hydrocodone/acetaminophen) - narcotic pain medication  DO NOT drive or perform any activities that require you to be awake and alert because this medicine can make you drowsy. BE VERY CAREFUL not to take multiple medicines containing Tylenol (also called acetaminophen). Doing so can lead to an overdose which can damage your liver and cause liver failure and possibly death.  Albuterol inhaler - medication that opens up your airway  Use inhaler as follows: 1-2 puffs with spacer every 4 hours as needed for wheezing, cough, or shortness of breath.   Take any prescribed medications only as directed.  Home care instructions:  Follow any educational materials contained in this packet.  BE VERY CAREFUL not to take multiple medicines containing Tylenol (also called acetaminophen). Doing so can lead to an overdose which can damage your liver and cause liver failure and possibly death.   Follow-up instructions: Please follow-up with your primary care provider in the next 2-3 days for further evaluation of your symptoms.   Return instructions:  Please return to the Emergency Department if you experience worsening symptoms.  Please return if you have any other emergent concerns.  Additional Information:  Your vital signs today were: BP 121/66   Pulse 100   Temp 99.2 F (37.3 C) (Oral)   Resp 20   SpO2 94%  If your blood pressure (BP) was elevated  above 135/85 this visit, please have this repeated by your doctor within one month. --------------
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
## 2023-02-12 ENCOUNTER — Other Ambulatory Visit: Payer: Self-pay | Admitting: Internal Medicine

## 2023-02-12 DIAGNOSIS — E781 Pure hyperglyceridemia: Secondary | ICD-10-CM

## 2023-02-13 ENCOUNTER — Telehealth: Payer: Self-pay | Admitting: Neurology

## 2023-02-13 NOTE — Telephone Encounter (Signed)
PA completed on [facility]  ZOX:[id number] The PA kicked back stating available without authorization

## 2023-02-14 ENCOUNTER — Encounter (HOSPITAL_COMMUNITY): Payer: Self-pay | Admitting: Emergency Medicine

## 2023-02-14 ENCOUNTER — Emergency Department (HOSPITAL_COMMUNITY)
Admission: EM | Admit: 2023-02-14 | Discharge: 2023-02-14 | Discharge status: Against Medical Advice | Payer: Medicaid Other | Attending: Emergency Medicine | Admitting: Emergency Medicine

## 2023-02-14 DIAGNOSIS — R1111 Vomiting without nausea: Secondary | ICD-10-CM | POA: Diagnosis not present

## 2023-02-14 DIAGNOSIS — Z5321 Procedure and treatment not carried out due to patient leaving prior to being seen by health care provider: Secondary | ICD-10-CM | POA: Insufficient documentation

## 2023-02-14 DIAGNOSIS — R110 Nausea: Secondary | ICD-10-CM | POA: Diagnosis not present

## 2023-02-14 DIAGNOSIS — R197 Diarrhea, unspecified: Secondary | ICD-10-CM | POA: Diagnosis not present

## 2023-02-14 DIAGNOSIS — I959 Hypotension, unspecified: Secondary | ICD-10-CM | POA: Diagnosis not present

## 2023-02-14 DIAGNOSIS — R112 Nausea with vomiting, unspecified: Secondary | ICD-10-CM | POA: Insufficient documentation

## 2023-02-14 LAB — COMPREHENSIVE METABOLIC PANEL
ALT: 15 U/L (ref 0–44)
AST: 25 U/L (ref 15–41)
Albumin: 4 g/dL (ref 3.5–5.0)
Alkaline Phosphatase: 68 U/L (ref 38–126)
Anion gap: 15 (ref 5–15)
BUN: 15 mg/dL (ref 8–23)
CO2: 18 mmol/L — ABNORMAL LOW (ref 22–32)
Calcium: 10 mg/dL (ref 8.9–10.3)
Chloride: 101 mmol/L (ref 98–111)
Creatinine, Ser: 0.96 mg/dL (ref 0.44–1.00)
GFR, Estimated: >60 mL/min (ref >60)
Glucose, Bld: 119 mg/dL — ABNORMAL HIGH (ref 70–99)
Potassium: 4.8 mmol/L (ref 3.5–5.1)
Sodium: 134 mmol/L — ABNORMAL LOW (ref 135–145)
Total Bilirubin: 0.5 mg/dL (ref 0.3–1.2)
Total Protein: 7.5 g/dL (ref 6.5–8.1)

## 2023-02-14 LAB — CBC
HCT: 43.5 % (ref 36.0–46.0)
Hemoglobin: 14.3 g/dL (ref 12.0–15.0)
MCH: 29.8 pg (ref 26.0–34.0)
MCHC: 32.9 g/dL (ref 30.0–36.0)
MCV: 90.6 fL (ref 80.0–100.0)
Platelets: 378 10*3/uL (ref 150–400)
RBC: 4.8 MIL/uL (ref 3.87–5.11)
RDW: 13.2 % (ref 11.5–15.5)
WBC: 7.9 10*3/uL (ref 4.0–10.5)
nRBC: 0 % (ref 0.0–0.2)

## 2023-02-14 LAB — LIPASE, BLOOD: Lipase: 28 U/L (ref 11–51)

## 2023-02-14 NOTE — ED Provider Triage Note (Signed)
Emergency Medicine Provider Triage Evaluation Note  [name] , a 67 y.o. female  was evaluated in triage.  Pt complains of nausea and vomiting which began this evening, last oral intake at 1pm which she proceeded to vomiting. No blood present, has not taken any  medication for improvement in symptoms.   Review of Systems  Positive: Nausea, vomiting Negative: Fever ,chills  Physical Exam  BP 90/72 (BP Location: Right Arm)   Pulse 85   Temp 98.4 F (36.9 C) (Oral)   Resp 17   Ht 5' (1.524 m)   Wt 98 kg   SpO2 98%   BMI 42.19 kg/m  Gen:   Awake, no distress   Resp:  Normal effort  MSK:   Moves extremities without difficulty  Other:    Medical Decision Making  Medically screening exam initiated at 5:39 PM.  Appropriate orders placed.  [name] was informed that the remainder of the evaluation will be completed by another provider, this initial triage assessment does not replace that evaluation, and the importance of remaining in the ED until their evaluation is complete.     Claude Manges, PA-C 02/14/23 1742

## 2023-02-14 NOTE — ED Notes (Signed)
Per pt's family they are tired of waiting and patient wants to go home. Pt and family was educated on the risk of leaving without being seen and patient understands the risk. Pt states she is just going to wait it out at home and get some rest. This RN educated pt on returning if she needed medical help. Pt verbalized understanding.

## 2023-02-14 NOTE — ED Triage Notes (Signed)
Pt arrives via EMS from home with n/v/d for 3 days. Pt ran out of zofran at home. EMS unable to get orthostatics due to dizziness.

## 2023-02-15 ENCOUNTER — Other Ambulatory Visit (HOSPITAL_BASED_OUTPATIENT_CLINIC_OR_DEPARTMENT_OTHER): Payer: Self-pay

## 2023-02-15 ENCOUNTER — Emergency Department (HOSPITAL_BASED_OUTPATIENT_CLINIC_OR_DEPARTMENT_OTHER)
Admission: EM | Admit: 2023-02-15 | Discharge: 2023-02-15 | Disposition: A | Discharge status: Home / Self Care | Payer: Medicaid Other | Attending: Emergency Medicine | Admitting: Emergency Medicine

## 2023-02-15 ENCOUNTER — Other Ambulatory Visit: Payer: Self-pay

## 2023-02-15 ENCOUNTER — Emergency Department (HOSPITAL_BASED_OUTPATIENT_CLINIC_OR_DEPARTMENT_OTHER): Payer: Medicaid Other | Admitting: Radiology

## 2023-02-15 DIAGNOSIS — R110 Nausea: Secondary | ICD-10-CM | POA: Diagnosis not present

## 2023-02-15 DIAGNOSIS — R42 Dizziness and giddiness: Secondary | ICD-10-CM | POA: Diagnosis not present

## 2023-02-15 DIAGNOSIS — R0602 Shortness of breath: Secondary | ICD-10-CM | POA: Insufficient documentation

## 2023-02-15 DIAGNOSIS — R1013 Epigastric pain: Secondary | ICD-10-CM | POA: Insufficient documentation

## 2023-02-15 DIAGNOSIS — I959 Hypotension, unspecified: Secondary | ICD-10-CM | POA: Diagnosis not present

## 2023-02-15 DIAGNOSIS — R062 Wheezing: Secondary | ICD-10-CM | POA: Diagnosis not present

## 2023-02-15 DIAGNOSIS — R112 Nausea with vomiting, unspecified: Secondary | ICD-10-CM | POA: Diagnosis not present

## 2023-02-15 LAB — CBC
HCT: 39.6 % (ref 36.0–46.0)
Hemoglobin: 13.2 g/dL (ref 12.0–15.0)
MCH: 29.7 pg (ref 26.0–34.0)
MCHC: 33.3 g/dL (ref 30.0–36.0)
MCV: 89 fL (ref 80.0–100.0)
Platelets: 329 10*3/uL (ref 150–400)
RBC: 4.45 MIL/uL (ref 3.87–5.11)
RDW: 13.3 % (ref 11.5–15.5)
WBC: 7.9 10*3/uL (ref 4.0–10.5)
nRBC: 0 % (ref 0.0–0.2)

## 2023-02-15 LAB — URINALYSIS, ROUTINE W REFLEX MICROSCOPIC
Bacteria, UA: NONE SEEN
Bilirubin Urine: NEGATIVE
Glucose, UA: NEGATIVE mg/dL
Hgb urine dipstick: NEGATIVE
Ketones, ur: NEGATIVE mg/dL
Nitrite: NEGATIVE
Protein, ur: NEGATIVE mg/dL
Specific Gravity, Urine: 1.017 (ref 1.005–1.030)
pH: 5 (ref 5.0–8.0)

## 2023-02-15 LAB — COMPREHENSIVE METABOLIC PANEL
ALT: 9 U/L (ref 0–44)
AST: 19 U/L (ref 15–41)
Albumin: 3.9 g/dL (ref 3.5–5.0)
Alkaline Phosphatase: 59 U/L (ref 38–126)
Anion gap: 11 (ref 5–15)
BUN: 17 mg/dL (ref 8–23)
CO2: 22 mmol/L (ref 22–32)
Calcium: 10 mg/dL (ref 8.9–10.3)
Chloride: 104 mmol/L (ref 98–111)
Creatinine, Ser: 0.87 mg/dL (ref 0.44–1.00)
GFR, Estimated: >60 mL/min (ref >60)
Glucose, Bld: 147 mg/dL — ABNORMAL HIGH (ref 70–99)
Potassium: 3.8 mmol/L (ref 3.5–5.1)
Sodium: 137 mmol/L (ref 135–145)
Total Bilirubin: 0.4 mg/dL (ref 0.3–1.2)
Total Protein: 7.1 g/dL (ref 6.5–8.1)

## 2023-02-15 LAB — LIPASE, BLOOD: Lipase: 19 U/L (ref 11–51)

## 2023-02-15 LAB — D-DIMER, QUANTITATIVE: D-Dimer, Quant: 0.52 ug/mL-FEU — ABNORMAL HIGH (ref 0.00–0.50)

## 2023-02-15 MED ORDER — IPRATROPIUM-ALBUTEROL 0.5-2.5 (3) MG/3ML IN SOLN
RESPIRATORY_TRACT | Status: AC
  Administered 2023-02-15: 3 mL
  Filled 2023-02-15: qty 3

## 2023-02-15 MED ORDER — ONDANSETRON HCL 4 MG/2ML IJ SOLN
4.0000 mg | Freq: Once | INTRAMUSCULAR | Status: AC
  Administered 2023-02-15: 4 mg via INTRAVENOUS
  Filled 2023-02-15: qty 2

## 2023-02-15 MED ORDER — SODIUM CHLORIDE 0.9 % IV BOLUS
1000.0000 mL | Freq: Once | INTRAVENOUS | Status: AC
  Administered 2023-02-15: 1000 mL via INTRAVENOUS

## 2023-02-15 MED ORDER — ONDANSETRON 4 MG PO TBDP: 4.0000 mg | ORAL_TABLET | Freq: Three times a day (TID) | ORAL | 0 refills | Status: DC | PRN

## 2023-02-15 NOTE — ED Provider Notes (Signed)
[place] EMERGENCY DEPARTMENT AT [facility] Provider Note   CSN: [id number] Arrival date & time: 02/15/23  1324     History Chief Complaint  Patient presents with   Nausea   Shortness of Breath    [name] is a 67 y.o. female.  Patient with past medical history significant for adrenal insufficiency, respiratory failure, AKI, morbid obesity presents emergency department with complaints of nausea and shortness of breath.  She reports the symptoms been present for the last 3 days.  Also endorsing some associated diarrhea and epigastric tenderness.  Patient was given albuterol and Zofran by EMS with some improvement in symptoms.  Currently denies any chest pain, headaches, urinary symptoms.  No recent exposure to any sick contacts with similar symptoms.  Denies any recent changes in any medications.  Recent alcohol use, marijuana use, or any other substance.   Shortness of Breath      Home Medications Prior to Admission medications   Medication Sig Start Date End Date Taking? Authorizing Provider  aspirin EC 81 MG tablet Take 81 mg by mouth daily. Swallow whole.    [provider]  fenofibrate (TRICOR) 48 MG tablet TAKE ONE TABLET BY MOUTH EVERY DAY 01/26/23   Marinus Maw, MD  flecainide (TAMBOCOR) 50 MG tablet Take 1 tablet (50 mg total) by mouth 2 (two) times daily. 11/09/22   Marinus Maw, MD  Galcanezumab-gnlm [facility]) 120 MG/ML SOAJ Inject 120 mg into the skin every 30 (thirty) days. 10/19/22   Ihor Austin, NP  HYDROcodone-acetaminophen (NORCO/VICODIN) 5-325 MG tablet Take 1 tablet by mouth every 6 (six) hours as needed for severe pain. 01/27/23   Renne Crigler, PA-C  hydrocortisone (CORTEF) 10 MG tablet Take 1 tablet (10 mg total) by mouth as directed. 15 mg with Breakfast and 10 mg between 2-4 pm daily 07/20/22   Shamleffer, Konrad Dolores, MD  hydrOXYzine (ATARAX) 10 MG tablet Take 1 tablet (10 mg total) by mouth at bedtime as needed. 01/04/22    Ivonne Andrew, NP  levothyroxine (SYNTHROID) 75 MCG tablet Take 1 tablet (75 mcg total) by mouth daily. 02/27/22 02/27/23  Ivonne Andrew, NP  methocarbamol (ROBAXIN) 500 MG tablet Take 2 tablets (1,000 mg total) by mouth every 8 (eight) hours as needed for muscle spasms. 01/27/23   Renne Crigler, PA-C  metoprolol tartrate (LOPRESSOR) 25 MG tablet Take 1 tablet (25 mg total) by mouth 2 (two) times daily. 11/10/22   Benjiman Core, MD  nitroGLYCERIN (NITROSTAT) 0.4 MG SL tablet Place 1 tablet (0.4 mg total) under the tongue every 5 (five) minutes x 3 doses as needed for chest pain. 12/11/22   Ivonne Andrew, NP  omega-3 acid ethyl esters (LOVAZA) 1 g capsule Take 1 capsule (1 g total) by mouth 2 (two) times daily. 02/08/22 02/08/23  Ivonne Andrew, NP  omeprazole (PRILOSEC) 20 MG capsule Take 1 capsule (20 mg total) by mouth daily. 01/27/23   Renne Crigler, PA-C  ondansetron (ZOFRAN-ODT) 4 MG disintegrating tablet Take 1 tablet (4 mg total) by mouth every 8 (eight) hours as needed for nausea or vomiting. 02/15/23   Smitty Knudsen, PA-C  Rimegepant Sulfate (NURTEC) 75 MG TBDP Take 1 tablet (75 mg total) by mouth daily as needed. For migraines. Take as close to onset of migraine as possible. One daily maximum. 10/19/22   Ihor Austin, NP  rosuvastatin (CRESTOR) 10 MG tablet TAKE ONE TABLET BY MOUTH DAILY 12/14/22   Ivonne Andrew, NP  venlafaxine  XR (EFFEXOR-XR) 150 MG 24 hr capsule Take 1 capsule (150 mg total) by mouth daily. 12/02/21 12/11/22  Barbette Merino, NP  Vitamin D, Ergocalciferol, (DRISDOL) 1.25 MG (50000 UNIT) CAPS capsule Take 1 capsule (50,000 Units total) by mouth every 7 (seven) days. 01/23/23   Ivonne Andrew, NP      Allergies    Aspirin, Ibuprofen [ibuprofen], and Penicillins    Review of Systems   Review of Systems  Respiratory:  Positive for shortness of breath.   All other systems reviewed and are negative.   Physical Exam Updated Vital Signs BP 135/82   Pulse 99    Temp 98.1 F (36.7 C) (Oral)   Resp 15   Wt 98 kg   SpO2 97%   BMI 42.19 kg/m  Physical Exam Vitals and nursing note reviewed.  Constitutional:      General: She is not in acute distress.    Appearance: She is well-developed.  HENT:     Head: Normocephalic and atraumatic.  Eyes:     Conjunctiva/sclera: Conjunctivae normal.  Cardiovascular:     Rate and Rhythm: Normal rate and regular rhythm.     Heart sounds: No murmur heard. Pulmonary:     Effort: Pulmonary effort is normal. No respiratory distress.     Breath sounds: Normal breath sounds.  Abdominal:     Palpations: Abdomen is soft.     Tenderness: There is abdominal tenderness in the epigastric area.  Musculoskeletal:        General: No swelling.     Cervical back: Neck supple.  Skin:    General: Skin is warm and dry.     Capillary Refill: Capillary refill takes less than 2 seconds.  Neurological:     Mental Status: She is alert.  Psychiatric:        Mood and Affect: Mood normal.     ED Results / Procedures / Treatments   Labs (all labs ordered are listed, but only abnormal results are displayed) Labs Reviewed  COMPREHENSIVE METABOLIC PANEL - Abnormal; Notable for the following components:      Result Value   Glucose, Bld 147 (*)    All other components within normal limits  URINALYSIS, ROUTINE W REFLEX MICROSCOPIC - Abnormal; Notable for the following components:   Leukocytes,Ua TRACE (*)    All other components within normal limits  D-DIMER, QUANTITATIVE - Abnormal; Notable for the following components:   D-Dimer, Quant 0.52 (*)    All other components within normal limits  LIPASE, BLOOD  CBC    EKG EKG Interpretation  Date/Time:  Thursday February 15 2023 13:37:47 EDT Ventricular Rate:  96 PR Interval:  172 QRS Duration: 101 QT Interval:  365 QTC Calculation: 462 R Axis:   44 Text Interpretation: Sinus rhythm pvcs Nonspecific T abnormalities, anterior leads Confirmed by Margarita Grizzle [phone]) on  02/15/2023 1:53:04 PM  Radiology DG Chest Port 1 View  Result Date: 02/15/2023 CLINICAL DATA:  Shortness of breath EXAM: PORTABLE CHEST 1 VIEW COMPARISON:  X-ray 11/10/2022 FINDINGS: Sternal wires. Hyperinflation. Slight linear opacity left lung base likely scar or atelectasis. No consolidation, pneumothorax or effusion. No edema. Overlapping cardiac leads. Kyphotic x-ray obscures the left lung apex. Overlapping presumed pacemaker leads along the heart with leads extending towards the abdomen. Please correlate with the history IMPRESSION: Postop chest with presumed pacemaker leads. Minimal left basilar atelectasis or scar Electronically Signed   By: Karen Kays M.D.   On: 02/15/2023 14:29    Procedures  Procedures   Medications Ordered in ED Medications  ipratropium-albuterol (DUONEB) 0.5-2.5 (3) MG/3ML nebulizer solution (3 mLs  Given 02/15/23 1345)  sodium chloride 0.9 % bolus 1,000 mL (0 mLs Intravenous Stopped 02/15/23 1557)  ondansetron (ZOFRAN) injection 4 mg (4 mg Intravenous Given 02/15/23 1427)    ED Course/ Medical Decision Making/ A&P                           Medical Decision Making Amount and/or Complexity of Data Reviewed Labs: ordered. Radiology: ordered.  Risk Prescription drug management.   This patient presents to the ED for concern of nausea and shortness of breath.  Differential diagnosis includes gastroenteritis, pancreatitis, bowel obstruction, pulmonary malaise him   Lab Tests:  I Ordered, and personally interpreted labs.  The pertinent results include: CBC unremarkable, CMP unremarkable, lipase negative, UA without signs of infection, D-dimer negative when age-adjusted   Imaging Studies ordered:  I ordered imaging studies including chest x-ray I independently visualized and interpreted imaging which showed no acute cardiopulmonary process I agree with the radiologist interpretation   Medicines ordered and prescription drug management:  I ordered medication  including fluids, Zofran for dehydration, nausea Reevaluation of the patient after these medicines showed that the patient improved I have reviewed the patients home medicines and have made adjustments as needed   Problem List / ED Course:  Patient presented to the emergency department complaints of nausea and shortness of breath.  She reports that the symptoms have been present for the last 2 days and have not been improving.  Reports majority pain is in the epigastric region.  Some prior history of acute respiratory failure but denies any significant respiratory distress at this time.  No obvious known sick contacts. Lab workup initiated with basic labs, lipase, and D-dimer.  All labs are largely reassuring and D-dimer negative for acute PE.  Unsure exact etiology of patient's symptoms at this time.  The patient did report some improvement with fluids and Zofran.  Will send patient home with a prescription for Zofran for outpatient management of nausea.  Advised patient to return to the emergency department if there is any acute worsening of symptoms or unable to manage nausea.  At this time believe the patient is safe and stable for discharge and outpatient management.  Final Clinical Impression(s) / ED Diagnoses Final diagnoses:  Nausea  Shortness of breath    Rx / DC Orders ED Discharge Orders          Ordered    ondansetron (ZOFRAN-ODT) 4 MG disintegrating tablet  Every 8 hours PRN,   Status:  Discontinued        02/15/23 1730    ondansetron (ZOFRAN-ODT) 4 MG disintegrating tablet  Every 8 hours PRN        02/15/23 1755              Smitty Knudsen, PA-C 02/15/23 2347    Margarita Grizzle, MD 02/19/23 1109

## 2023-02-15 NOTE — ED Notes (Addendum)
Place on 2LPM via [place] while sleeping. HX OSA.

## 2023-02-15 NOTE — ED Triage Notes (Signed)
Pt reports shortness of breath and 3 days of N/V/D with epigastric pain. EMS administered albuterol nebulizer and Zofran.

## 2023-02-15 NOTE — Discharge Instructions (Addendum)
You were seen in the emergency department today for nausea and shortness of breath.  Your labs and imaging were all largely reassuring without any acute abnormalities noted.  I have sent a prescription for Zofran to your pharmacy.  You may use this for nausea as needed.  If you have any acute worsening of your symptoms please return the emergency department for further evaluation.

## 2023-02-15 NOTE — ED Notes (Signed)
DC papers reviewed. No questions or concerns. No signs of distress. Pt assisted to wheelchair and out to lobby. Appropriate measures for safety taken. 

## 2023-02-15 NOTE — ED Notes (Signed)
Pt ambulated to and from restroom with minimal assistance.

## 2023-02-18 ENCOUNTER — Emergency Department (HOSPITAL_BASED_OUTPATIENT_CLINIC_OR_DEPARTMENT_OTHER)
Admission: EM | Admit: 2023-02-18 | Discharge: 2023-02-18 | Disposition: A | Discharge status: Home / Self Care | Payer: Medicaid Other | Attending: Emergency Medicine | Admitting: Emergency Medicine

## 2023-02-18 ENCOUNTER — Encounter (HOSPITAL_BASED_OUTPATIENT_CLINIC_OR_DEPARTMENT_OTHER): Payer: Self-pay | Admitting: Emergency Medicine

## 2023-02-18 ENCOUNTER — Emergency Department (HOSPITAL_BASED_OUTPATIENT_CLINIC_OR_DEPARTMENT_OTHER): Payer: Medicaid Other

## 2023-02-18 DIAGNOSIS — Z1152 Encounter for screening for COVID-19: Secondary | ICD-10-CM | POA: Diagnosis not present

## 2023-02-18 DIAGNOSIS — R519 Headache, unspecified: Secondary | ICD-10-CM

## 2023-02-18 DIAGNOSIS — J329 Chronic sinusitis, unspecified: Secondary | ICD-10-CM | POA: Insufficient documentation

## 2023-02-18 LAB — SARS CORONAVIRUS 2 BY RT PCR: SARS Coronavirus 2 by RT PCR: NEGATIVE

## 2023-02-18 MED ORDER — DEXAMETHASONE SODIUM PHOSPHATE 10 MG/ML IJ SOLN
10.0000 mg | Freq: Once | INTRAMUSCULAR | Status: AC
  Administered 2023-02-18: 10 mg via INTRAVENOUS
  Filled 2023-02-18: qty 1

## 2023-02-18 MED ORDER — AMOXICILLIN-POT CLAVULANATE 875-125 MG PO TABS: 1.0000 | ORAL_TABLET | Freq: Two times a day (BID) | ORAL | 0 refills | Status: DC

## 2023-02-18 MED ORDER — DIPHENHYDRAMINE HCL 50 MG/ML IJ SOLN
50.0000 mg | Freq: Once | INTRAMUSCULAR | Status: AC
  Administered 2023-02-18: 50 mg via INTRAVENOUS
  Filled 2023-02-18: qty 1

## 2023-02-18 MED ORDER — ONDANSETRON HCL 4 MG/2ML IJ SOLN
4.0000 mg | Freq: Once | INTRAMUSCULAR | Status: AC
  Administered 2023-02-18: 4 mg via INTRAVENOUS
  Filled 2023-02-18: qty 2

## 2023-02-18 MED ORDER — SODIUM CHLORIDE 0.9 % IV BOLUS
1000.0000 mL | Freq: Once | INTRAVENOUS | Status: AC
  Administered 2023-02-18: 1000 mL via INTRAVENOUS

## 2023-02-18 NOTE — ED Triage Notes (Signed)
Headache/migraine x 4 days. Not relieved with home meds Brought in by EMS from home Hx of migraine

## 2023-02-18 NOTE — ED Notes (Signed)
Discharge instructions discussed with pt. Pt verbalized understanding. Pt stable and ambulatory.  °

## 2023-02-18 NOTE — ED Notes (Signed)
Pt back to room after CT. Repositioned in bed, given crackers, and diet ginger ale (ok per PA).

## 2023-02-18 NOTE — ED Provider Notes (Signed)
[place] EMERGENCY DEPARTMENT AT [facility] Provider Note   CSN: [id number] Arrival date & time: 02/18/23  1504     History  No chief complaint on file.   [name] is a 67 y.o. female.  The history is provided by the patient and medical records. No language interpreter was used.     67 year old for female significant history of chronic migraine, prior stroke, concussion syndrome, depression, brought here via EMS from home for evaluation of headache.  Patient report gradual onset of right-sided headache ongoing for the past 4 days.  Headache described as a pounding sensation persistent with associated nausea and light sensitivity.  No fever or neck stiffness cold symptoms focal numbness focal weakness or rash.  Headache is similar to migraine headache that she normally have twice monthly but this 1 seems more intense.  Denies any change in her environment or change in medication but states that she is having trouble with getting a good night sleep.  She did try taking her home headache medication without adequate relief.  Home Medications Prior to Admission medications   Medication Sig Start Date End Date Taking? Authorizing Provider  aspirin EC 81 MG tablet Take 81 mg by mouth daily. Swallow whole.    [provider]  fenofibrate (TRICOR) 48 MG tablet TAKE ONE TABLET BY MOUTH EVERY DAY 01/26/23   Marinus Maw, MD  flecainide (TAMBOCOR) 50 MG tablet Take 1 tablet (50 mg total) by mouth 2 (two) times daily. 11/09/22   Marinus Maw, MD  Galcanezumab-gnlm [facility]) 120 MG/ML SOAJ Inject 120 mg into the skin every 30 (thirty) days. 10/19/22   Ihor Austin, NP  HYDROcodone-acetaminophen (NORCO/VICODIN) 5-325 MG tablet Take 1 tablet by mouth every 6 (six) hours as needed for severe pain. 01/27/23   Renne Crigler, PA-C  hydrocortisone (CORTEF) 10 MG tablet Take 1 tablet (10 mg total) by mouth as directed. 15 mg with Breakfast and 10 mg between 2-4 pm daily 07/20/22    Shamleffer, Konrad Dolores, MD  hydrOXYzine (ATARAX) 10 MG tablet Take 1 tablet (10 mg total) by mouth at bedtime as needed. 01/04/22   Ivonne Andrew, NP  levothyroxine (SYNTHROID) 75 MCG tablet Take 1 tablet (75 mcg total) by mouth daily. 02/27/22 02/27/23  Ivonne Andrew, NP  methocarbamol (ROBAXIN) 500 MG tablet Take 2 tablets (1,000 mg total) by mouth every 8 (eight) hours as needed for muscle spasms. 01/27/23   Renne Crigler, PA-C  metoprolol tartrate (LOPRESSOR) 25 MG tablet Take 1 tablet (25 mg total) by mouth 2 (two) times daily. 11/10/22   Benjiman Core, MD  nitroGLYCERIN (NITROSTAT) 0.4 MG SL tablet Place 1 tablet (0.4 mg total) under the tongue every 5 (five) minutes x 3 doses as needed for chest pain. 12/11/22   Ivonne Andrew, NP  omega-3 acid ethyl esters (LOVAZA) 1 g capsule Take 1 capsule (1 g total) by mouth 2 (two) times daily. 02/08/22 02/08/23  Ivonne Andrew, NP  omeprazole (PRILOSEC) 20 MG capsule Take 1 capsule (20 mg total) by mouth daily. 01/27/23   Renne Crigler, PA-C  ondansetron (ZOFRAN-ODT) 4 MG disintegrating tablet Take 1 tablet (4 mg total) by mouth every 8 (eight) hours as needed for nausea or vomiting. 02/15/23   Smitty Knudsen, PA-C  Rimegepant Sulfate (NURTEC) 75 MG TBDP Take 1 tablet (75 mg total) by mouth daily as needed. For migraines. Take as close to onset of migraine as possible. One daily maximum. 10/19/22   McCue,  Shanda Bumps, NP  rosuvastatin (CRESTOR) 10 MG tablet TAKE ONE TABLET BY MOUTH DAILY 12/14/22   Ivonne Andrew, NP  venlafaxine XR (EFFEXOR-XR) 150 MG 24 hr capsule Take 1 capsule (150 mg total) by mouth daily. 12/02/21 12/11/22  Barbette Merino, NP  Vitamin D, Ergocalciferol, (DRISDOL) 1.25 MG (50000 UNIT) CAPS capsule Take 1 capsule (50,000 Units total) by mouth every 7 (seven) days. 01/23/23   Ivonne Andrew, NP      Allergies    Aspirin, Ibuprofen [ibuprofen], and Penicillins    Review of Systems   Review of Systems  All other systems  reviewed and are negative.   Physical Exam Updated Vital Signs BP 109/63 (BP Location: Right Arm)   Pulse 86   Temp 98.4 F (36.9 C) (Oral)   Resp 16   SpO2 94%  Physical Exam Vitals and nursing note reviewed.  Constitutional:      General: She is not in acute distress.    Appearance: She is well-developed.  HENT:     Head: Normocephalic and atraumatic.  Eyes:     Extraocular Movements: Extraocular movements intact.     Conjunctiva/sclera: Conjunctivae normal.     Pupils: Pupils are equal, round, and reactive to light.     Comments: Mild left trabismus noted  Neck:     Vascular: No carotid bruit.  Cardiovascular:     Rate and Rhythm: Normal rate and regular rhythm.  Pulmonary:     Effort: Pulmonary effort is normal.  Abdominal:     Palpations: Abdomen is soft.     Tenderness: There is no abdominal tenderness.  Musculoskeletal:     Cervical back: Normal range of motion and neck supple. No rigidity or tenderness.     Comments: 5 out of 5 strength to all 4 extremities  Lymphadenopathy:     Cervical: No cervical adenopathy.  Skin:    Findings: No rash.  Neurological:     Mental Status: She is alert and oriented to person, place, and time.     GCS: GCS eye subscore is 4. GCS verbal subscore is 5. GCS motor subscore is 6.     Cranial Nerves: Cranial nerves 2-12 are intact.     Sensory: Sensation is intact.     Motor: Motor function is intact.     Coordination: Coordination is intact.  Psychiatric:        Mood and Affect: Mood normal.     ED Results / Procedures / Treatments   Labs (all labs ordered are listed, but only abnormal results are displayed) Labs Reviewed  SARS CORONAVIRUS 2 BY RT PCR    EKG None  Radiology CT Head Wo Contrast  Result Date: 02/18/2023 CLINICAL DATA:  Head trauma.  Headache with migraine. EXAM: CT HEAD WITHOUT CONTRAST TECHNIQUE: Contiguous axial images were obtained from the base of the skull through the vertex without intravenous  contrast. RADIATION DOSE REDUCTION: This exam was performed according to the departmental dose-optimization program which includes automated exposure control, adjustment of the mA and/or kV according to patient size and/or use of iterative reconstruction technique. COMPARISON:  10/10/2019. FINDINGS: Brain: No acute intracranial hemorrhage, midline shift or mass effect. No extra-axial fluid collection. Gray-white matter differentiation is within normal limits. No hydrocephalus. The sella is mildly enlarged and empty sella is noted. Vascular: No hyperdense vessel or unexpected calcification. Skull: Normal. Negative for fracture or focal lesion. Sinuses/Orbits: There is partial opacification of the ethmoid air cells, sphenoid sinuses, and maxillary sinuses bilaterally and  frontal sinus on the right. The previously described right orbital lesion is not seen on this exam. The orbits are within normal limits. Other: None. IMPRESSION: 1. No acute intracranial process. 2. 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 3. Diffuse chronic sinus disease. Electronically Signed   By: Thornell Sartorius M.D.   On: 02/18/2023 21:33    Procedures Procedures    Medications Ordered in ED Medications  dexamethasone (DECADRON) injection 10 mg (10 mg Intravenous Given 02/18/23 1552)  diphenhydrAMINE (BENADRYL) injection 50 mg (50 mg Intravenous Given 02/18/23 1553)  ondansetron (ZOFRAN) injection 4 mg (4 mg Intravenous Given 02/18/23 1552)  sodium chloride 0.9 % bolus 1,000 mL (0 mLs Intravenous Stopped 02/18/23 1900)    ED Course/ Medical Decision Making/ A&P                             Medical Decision Making Amount and/or Complexity of Data Reviewed Radiology: ordered.  Risk Prescription drug management.   BP 109/63 (BP Location: Right Arm)   Pulse 86   Temp 98.4 F (36.9 C) (Oral)   Resp 16   SpO2 94%   24:10 PM 67 year old for female significant history of chronic migraine, prior  stroke, concussion syndrome, depression, brought here via EMS from home for evaluation of headache.  Patient report gradual onset of right-sided headache ongoing for the past 4 days.  Headache described as a pounding sensation persistent with associated nausea and light sensitivity.  No fever or neck stiffness cold symptoms focal numbness focal weakness or rash.  Headache is similar to migraine headache that she normally have twice monthly but this 1 seems more intense.  Denies any change in her environment or change in medication but states that she is having trouble with getting a good night sleep.  She did try taking her home headache medication without adequate relief.  On exam, patient is laying in bed with eyes closed but in no acute discomfort.  Head is normocephalic atraumatic, pupils equal round reactive extraocular movement intact, mild left trismus noted.  She has no focal neurologic deficit no facial droop no nuchal rigidity.  She has normal equal strength throughout all 4 extremities.  -Labs ordered, independently viewed and interpreted by me.  Labs remarkable for covid negative -The patient was maintained on a cardiac monitor.  I personally viewed and interpreted the cardiac monitored which showed an underlying rhythm of: NSR -Imaging independently viewed and interpreted by me and I agree with radiologist's interpretation.  Result remarkable for head CT showing now acute changes.  Empty expanded sella which may suggest intracranial hypertension.  This is similar to prior brain MRI a few years ago.  Will have pt f/u with her neurologist for further care -This patient presents to the ED for concern of headache, this involves an extensive number of treatment options, and is a complaint that carries with it a high risk of complications and morbidity.  The differential diagnosis includes migraine, cluster, tension, sah, meningitis, pseudotumor cerebri -Co morbidities that complicate the patient  evaluation includes migraine -Treatment includes migraine cocktail -Reevaluation of the patient after these medicines showed that the patient improved -PCP office notes or outside notes reviewed -Discussion with specialist attending Dr. Rubin Payor -Escalation to admission/observation considered: patients feels much better, is comfortable with discharge, and will follow up with PCP -Prescription medication considered, patient comfortable with outpt f/u -Social Determinant of Health considered  Since CT did mention chronic  sinusitis I discussed this with patient and she does complain of some sinus congestion.  Although I am not quite sure if sinusitis is the source of her recurrent migraine I felt it would be appropriate to prescribe patient Augmentin in the event that sinusitis is quite contribute to her symptoms.  Otherwise I strong encouraged patient to follow-up closely with her neurologist for further managements of her recurrent headache.  Patient voiced understanding and agrees with plan.         Final Clinical Impression(s) / ED Diagnoses Final diagnoses:  Bad headache  Chronic sinusitis, unspecified location    Rx / DC Orders ED Discharge Orders          Ordered    amoxicillin-clavulanate (AUGMENTIN) 875-125 MG tablet  Every 12 hours        02/18/23 2202              Fayrene Helper, PA-C 02/18/23 2203    Benjiman Core, MD 02/18/23 [phone]

## 2023-02-18 NOTE — Discharge Instructions (Signed)
You have been evaluated for your headache.  This is likely due to your chronic migraine headache.  Continue to take medication please for the prescribed for your headache including Emgality.  CT scan today did show evidence of chronic sinusitis.  Sometimes sinusitis can also contribute to headache.  Take antibiotic as prescribed.  Follow-up closely with your neurologist for outpatient management of condition.  Return if you have any concern

## 2023-02-18 NOTE — ED Notes (Signed)
Patient transported to CT 

## 2023-02-19 ENCOUNTER — Other Ambulatory Visit: Payer: Self-pay | Admitting: Internal Medicine

## 2023-02-19 DIAGNOSIS — E781 Pure hyperglyceridemia: Secondary | ICD-10-CM

## 2023-02-21 ENCOUNTER — Other Ambulatory Visit: Payer: Self-pay | Admitting: Nurse Practitioner

## 2023-02-21 ENCOUNTER — Other Ambulatory Visit (HOSPITAL_COMMUNITY): Payer: Self-pay

## 2023-02-21 ENCOUNTER — Other Ambulatory Visit (HOSPITAL_COMMUNITY): Payer: Self-pay | Admitting: Emergency Medicine

## 2023-02-21 MED ORDER — OMEPRAZOLE 20 MG PO CPDR: 20.0000 mg | DELAYED_RELEASE_CAPSULE | Freq: Every day | ORAL | 0 refills | Status: DC

## 2023-02-22 ENCOUNTER — Telehealth: Payer: Self-pay | Admitting: Neurology

## 2023-02-22 NOTE — Telephone Encounter (Signed)
Thayer Ohm called to checked the status on a HST for the patient, but Dr. Vickey Huger did not order that for the patient.. I sent a mychart message to the patient.

## 2023-03-01 ENCOUNTER — Other Ambulatory Visit: Payer: Self-pay | Admitting: Nurse Practitioner

## 2023-03-07 ENCOUNTER — Ambulatory Visit (INDEPENDENT_AMBULATORY_CARE_PROVIDER_SITE_OTHER): Payer: Medicaid Other | Admitting: Internal Medicine

## 2023-03-07 ENCOUNTER — Encounter: Payer: Self-pay | Admitting: Internal Medicine

## 2023-03-07 VITALS — BP 100/74 | HR 53 | Ht 60.0 in | Wt 204.0 lb

## 2023-03-07 DIAGNOSIS — K219 Gastro-esophageal reflux disease without esophagitis: Secondary | ICD-10-CM

## 2023-03-07 DIAGNOSIS — Z800 Family history of malignant neoplasm of digestive organs: Secondary | ICD-10-CM

## 2023-03-07 DIAGNOSIS — K769 Liver disease, unspecified: Secondary | ICD-10-CM | POA: Diagnosis not present

## 2023-03-07 MED ORDER — PLENVU 140 G PO SOLR: 1.0000 | Freq: Once | ORAL | 0 refills | Status: AC

## 2023-03-07 NOTE — Progress Notes (Signed)
HISTORY OF PRESENT ILLNESS:  [name] is a 67 y.o. female with multiple medical problems as listed below, including with renal insufficiency and prior history of CVA.Marland Kitchen  She is sent to this office today regarding abnormal liver imaging.  She has a history of GERD for which she is on PPI.  She also has a history of colon cancer in her sister at age 62.  She underwent screening colonoscopy March 2016.  Examination revealed moderate diverticulosis of the sigmoid colon but was otherwise normal.  Follow-up in 5 years recommended.  Her current history is that of flank pain for which she was evaluated in the emergency room December 04, 2022.  As part of that evaluation, she underwent a CT scan of the abdomen and pelvis with contrast on that same date.  The current history is that of flank pain for which she was evaluated in the emergency room December 04, 2022.  She underwent contrast-enhanced CT scan of the abdomen pelvis that day.  She was found to have uncharacterized subcentimeter lesions of the liver felt to be cysts or hemangiomas.  She was subsequently seen by her PCP and is referred for that reason.  She has no personal history of liver disease.  No family history of liver disease.  No personal history of cancer.  She was seen again in the emergency department February 15, 2023 regarding nausea and shortness of breath.  Workup was negative.  She was treated symptomatically.  Symptoms have resolved without recurrence.   Blood work from that day shows unremarkable comprehensive metabolic panel.  Normal CBC with hemoglobin 13.2.  Reflux symptoms are well-controlled on PPI.  GI review of systems is negative.  REVIEW OF SYSTEMS:  All non-GI ROS negative unless otherwise stated in the HPI except for arthritis, back pain, left eye blind  Past Medical History:  Diagnosis Date   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HCC)    Allergy    Amblyopia    Anemia    Arthritis    "in my lower back"   Asthma    Atrial myxoma     Blindness of left eye 1978   Blood transfusion    Breast cyst    Constipation    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HCC)    CVA (cerebral infarction)    Depression    Depression    GERD (gastroesophageal reflux disease)    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HCC)    Hemorrhoids    History of open heart surgery 1977   Hyperlipemia    Hypothyroidism    per office note dated 03/25/2014   Hypoxia    Migraine    Peptic ulcer associated with Helicobacter pylori infection    Reflux    Stroke (HCC) 1978   "piece tumor broke off & went to left eye; leaving me blind"    Past Surgical History:  Procedure Laterality Date   ADRENAL GLAND SURGERY  09/11/1980   on hydrocortisone   ATRIAL MYXOMA EXCISION  09/11/1976   BREAST BIOPSY  07/13/1999   ductal tissue excision & bx right; bx only left breast   COLONOSCOPY     EYE SURGERY     LEFT HEART CATHETERIZATION WITH CORONARY ANGIOGRAM N/A 10/06/2011   Procedure: LEFT HEART CATHETERIZATION WITH CORONARY ANGIOGRAM;  Surgeon: Robynn Pane, MD;  Location: [facility] CATH LAB;  Service: Cardiovascular;  Laterality: N/A;   ORBITOTOMY Right 07/27/2021   POLYPECTOMY     repaired cross eyed     "as a child"   ROTATOR CUFF REPAIR  05/12/2005   left   VAGINAL HYSTERECTOMY  09/11/1998   "partial; w/left ovary"    Social History [name]  reports that she has never smoked. She has never used smokeless tobacco. She reports that she does not drink alcohol and does not use drugs.  family history includes Acromegaly in her father; Colon cancer (age of onset: 75) in her sister; Diabetes in her sister; Heart disease in her daughter, father, and sister; Kidney disease in her sister; Stomach cancer in her father.  Allergies  Allergen Reactions   Aspirin Other (See Comments)    Can take baby aspirin (heart flutters with high doses)   Ibuprofen [Ibuprofen] Nausea And Vomiting   Penicillins Nausea And Vomiting    Has patient had a PCN reaction causing immediate rash,  facial/tongue/throat swelling, SOB or lightheadedness with hypotension: Yes Has patient had a PCN reaction causing severe rash involving mucus membranes or skin necrosis: No Has patient had a PCN reaction that required hospitalization No Has patient had a PCN reaction occurring within the last 10 years: No If all of the above answers are "NO", then may proceed with Cephalosporin use.        PHYSICAL EXAMINATION: Vital signs: BP 100/74   Pulse (!) 53   Ht 5' (1.524 m)   Wt 204 lb (92.5 kg)   BMI 39.84 kg/m   Constitutional: generally well-appearing, no acute distress Psychiatric: alert and oriented x3, cooperative Eyes: extraocular movements intact, anicteric, conjunctiva pink.  Left eye blind Mouth: oral pharynx moist, no lesions Neck: supple no lymphadenopathy Cardiovascular: heart regular rate and rhythm, no murmur Lungs: clear to auscultation bilaterally Abdomen: soft, obese, nontender, nondistended, no obvious ascites, no peritoneal signs, normal bowel sounds, no organomegaly Rectal: Deferred to colonoscopy Extremities: no clubbing, cyanosis, or lower extremity edema bilaterally Skin: no lesions on visible extremities Neuro: No focal deficits. No asterixis.    ASSESSMENT:  1.  Subcentimeter lesions of the liver.  Likely benign. 2.  Family history of colon cancer in sister less than age 50.  Last examination 2016 was negative for neoplasia.  Due for follow-up 3.  GERD.  Symptoms controlled PPI 4.  Multiple medical problems   PLAN:  1.  Ultrasound of the liver to further characterize subcentimeter liver lesions. 2.  Schedule surveillance colonoscopy.  Patient is higher than average risk due to her age and comorbidities.The nature of the procedure, as well as the risks, benefits, and alternatives were carefully and thoroughly reviewed with the patient. Ample time for discussion and questions allowed. The patient understood, was satisfied, and agreed to proceed. 3.  Reflux  precautions 4.  Continue PPI 5.  Ongoing general medical care with PCP Total time of 60 minutes was spent preparing to see the patient, reviewing a myriad of data and evaluations, obtaining interval history, performing medically appropriate physical examination, counseling educating the patient regarding above listed issues, ordering advanced radiology study, ordering endoscopic procedure, and documenting clinical information in the health record

## 2023-03-07 NOTE — Patient Instructions (Signed)
You have been scheduled for an abdominal ultrasound at [facility] (1st floor of hospital) on 03/12/2023 at 10:30am. Please arrive 15 minutes prior to your appointment for registration. Make certain not to have anything to eat or drink 6 hours prior to your appointment. Should you need to reschedule your appointment, please contact radiology at [phone]. This test typically takes about 30 minutes to perform.  You have been scheduled for a colonoscopy. Please follow written instructions given to you at your visit today.  Please pick up your prep supplies at the pharmacy within the next 1-3 days. If you use inhalers (even only as needed), please bring them with you on the day of your procedure.  _______________________________________________________  If your blood pressure at your visit was 140/90 or greater, please contact your primary care physician to follow up on this.  _______________________________________________________  If you are age 15 or older, your body mass index should be between 23-30. Your Body mass index is 39.84 kg/m. If this is out of the aforementioned range listed, please consider follow up with your Primary Care Provider.  If you are age 34 or younger, your body mass index should be between 19-25. Your Body mass index is 39.84 kg/m. If this is out of the aformentioned range listed, please consider follow up with your Primary Care Provider.   ________________________________________________________  The [facility] providers would like to encourage you to use [facility] to communicate with providers for non-urgent requests or questions.  Due to long hold times on the telephone, sending your provider a message by [facility] may be a faster and more efficient way to get a response.  Please allow 48 business hours for a response.  Please remember that this is for non-urgent requests.  _______________________________________________________

## 2023-03-12 ENCOUNTER — Ambulatory Visit (HOSPITAL_COMMUNITY): Admission: RE | Admit: 2023-03-12 | Payer: Medicaid Other | Source: Ambulatory Visit

## 2023-03-19 ENCOUNTER — Telehealth: Payer: Self-pay

## 2023-03-19 NOTE — Telephone Encounter (Signed)
Chart review completed for patient. Patient is due for screening mammogram. Mychart message sent to patient to inquire about scheduling mammogram.  Zaylei Mullane, Population Health Specialist.  

## 2023-03-21 ENCOUNTER — Other Ambulatory Visit: Payer: Self-pay | Admitting: Internal Medicine

## 2023-03-22 ENCOUNTER — Encounter: Payer: Medicaid Other | Admitting: Internal Medicine

## 2023-03-26 ENCOUNTER — Other Ambulatory Visit: Payer: Self-pay | Admitting: Nurse Practitioner

## 2023-03-26 DIAGNOSIS — E559 Vitamin D deficiency, unspecified: Secondary | ICD-10-CM

## 2023-03-26 MED ORDER — VITAMIN D (ERGOCALCIFEROL) 1.25 MG (50000 UNIT) PO CAPS: 50000.0000 [IU] | ORAL_CAPSULE | ORAL | 0 refills | Status: DC

## 2023-03-26 NOTE — Telephone Encounter (Signed)
Please advise in Tonya absences. KH

## 2023-03-27 ENCOUNTER — Other Ambulatory Visit: Payer: Self-pay | Admitting: Nurse Practitioner

## 2023-03-27 ENCOUNTER — Other Ambulatory Visit: Payer: Self-pay | Admitting: Internal Medicine

## 2023-04-02 ENCOUNTER — Ambulatory Visit (INDEPENDENT_AMBULATORY_CARE_PROVIDER_SITE_OTHER): Payer: Medicaid Other | Admitting: Nurse Practitioner

## 2023-04-02 ENCOUNTER — Encounter: Payer: Self-pay | Admitting: Nurse Practitioner

## 2023-04-02 ENCOUNTER — Other Ambulatory Visit: Payer: Self-pay

## 2023-04-02 ENCOUNTER — Telehealth: Payer: Self-pay

## 2023-04-02 VITALS — BP 95/70 | HR 83 | Temp 97.0°F | Wt 198.2 lb

## 2023-04-02 DIAGNOSIS — Z1231 Encounter for screening mammogram for malignant neoplasm of breast: Secondary | ICD-10-CM | POA: Diagnosis not present

## 2023-04-02 DIAGNOSIS — Z13220 Encounter for screening for lipoid disorders: Secondary | ICD-10-CM

## 2023-04-02 DIAGNOSIS — R935 Abnormal findings on diagnostic imaging of other abdominal regions, including retroperitoneum: Secondary | ICD-10-CM

## 2023-04-02 DIAGNOSIS — E781 Pure hyperglyceridemia: Secondary | ICD-10-CM | POA: Diagnosis not present

## 2023-04-02 DIAGNOSIS — F4323 Adjustment disorder with mixed anxiety and depressed mood: Secondary | ICD-10-CM

## 2023-04-02 DIAGNOSIS — N289 Disorder of kidney and ureter, unspecified: Secondary | ICD-10-CM

## 2023-04-02 DIAGNOSIS — E039 Hypothyroidism, unspecified: Secondary | ICD-10-CM

## 2023-04-02 MED ORDER — VENLAFAXINE HCL ER 150 MG PO CP24
150.0000 mg | ORAL_CAPSULE | Freq: Every day | ORAL | 11 refills | Status: DC
Start: 2023-04-02 — End: 2023-11-05

## 2023-04-02 MED ORDER — OMEGA-3-ACID ETHYL ESTERS 1 G PO CAPS
1.0000 g | ORAL_CAPSULE | Freq: Two times a day (BID) | ORAL | 3 refills | Status: DC
Start: 2023-04-02 — End: 2023-06-25

## 2023-04-02 MED ORDER — PLENVU 140 G PO SOLR: 1.0000 | Freq: Once | ORAL | 0 refills | Status: AC

## 2023-04-02 MED ORDER — LEVOTHYROXINE SODIUM 75 MCG PO TABS: 75.0000 ug | ORAL_TABLET | Freq: Every day | ORAL | 2 refills | Status: DC

## 2023-04-02 NOTE — Patient Instructions (Addendum)
1. Hypertriglyceridemia  - omega-3 acid ethyl esters (LOVAZA) 1 g capsule; Take 1 capsule (1 g total) by mouth 2 (two) times daily.  Dispense: 60 capsule; Refill: 3 - Lipid Panel  2. Adjustment disorder with mixed anxiety and depressed mood  - venlafaxine XR (EFFEXOR-XR) 150 MG 24 hr capsule; Take 1 capsule (150 mg total) by mouth daily.  Dispense: 30 capsule; Refill: 11  3. Encounter for screening mammogram for malignant neoplasm of breast  - MM Digital Screening  4. Lipid screening  - Lipid Panel  5. Hypothyroidism, unspecified type  - CBC - Comprehensive metabolic panel  Follow up:  Follow up in 6 months

## 2023-04-02 NOTE — Progress Notes (Signed)
@Patient  ID: [name], female    DOB: [birth year], 67 y.o.   MRN: [id number]  Chief Complaint  Patient presents with   Follow-up    Referring provider: Ivonne Andrew, NP   HPI  Patient presents today for a follow-up visit.  She does have an upcoming colonoscopy scheduled.  She states she continues to have blood in her stool and abdominal pain.  Patient will need blood work today.  Patient still has not followed with nephrology from previous abnormal CT scan which showed lesions on her kidneys.  We will check about this with the referral coordinator today.  Patient does need refills on medications.  Patient does need blood work today.  Denies f/c/s, n/v/d, hemoptysis, PND, leg swelling Denies chest pain or edema       Allergies  Allergen Reactions   Aspirin Other (See Comments)    Can take baby aspirin (heart flutters with high doses)   Ibuprofen [Ibuprofen] Nausea And Vomiting   Penicillins Nausea And Vomiting    Has patient had a PCN reaction causing immediate rash, facial/tongue/throat swelling, SOB or lightheadedness with hypotension: Yes Has patient had a PCN reaction causing severe rash involving mucus membranes or skin necrosis: No Has patient had a PCN reaction that required hospitalization No Has patient had a PCN reaction occurring within the last 10 years: No If all of the above answers are "NO", then may proceed with Cephalosporin use.     Immunization History  Administered Date(s) Administered   Influenza Split 08/03/2011, 07/18/2015   Influenza,inj,Quad PF,6+ Mos 07/02/2016, 09/27/2018, 05/30/2021, 07/13/2022   Tdap 09/27/2018    Past Medical History:  Diagnosis Date   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HCC)    Allergy    Amblyopia    Anemia    Arthritis    "in my lower back"   Asthma    Atrial myxoma    Blindness of left eye 1978   Blood transfusion    Breast cyst    Constipation    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HCC)    CVA (cerebral infarction)     Depression    Depression    GERD (gastroesophageal reflux disease)    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HCC)    Hemorrhoids    History of open heart surgery 1977   Hyperlipemia    Hypothyroidism    per office note dated 03/25/2014   Hypoxia    Migraine    Peptic ulcer associated with Helicobacter pylori infection    Reflux    Stroke (HCC) 1978   "piece tumor broke off & went to left eye; leaving me blind"    Tobacco History: Social History   Tobacco Use  Smoking Status Never  Smokeless Tobacco Never   Counseling given: Not Answered   Outpatient Encounter Medications as of 04/02/2023  Medication Sig   aspirin EC 81 MG tablet Take 81 mg by mouth daily. Swallow whole.   fenofibrate (TRICOR) 48 MG tablet TAKE ONE TABLET BY MOUTH EVERY DAY   flecainide (TAMBOCOR) 50 MG tablet Take 1 tablet (50 mg total) by mouth 2 (two) times daily.   Galcanezumab-gnlm (EMGALITY) 120 MG/ML SOAJ Inject 120 mg into the skin every 30 (thirty) days.   HYDROcodone-acetaminophen (NORCO/VICODIN) 5-325 MG tablet Take 1 tablet by mouth every 6 (six) hours as needed for severe pain.   hydrocortisone (CORTEF) 10 MG tablet Take 1 tablet (10 mg total) by mouth as directed. 15 mg with Breakfast and 10 mg between 2-4 pm daily   hydrOXYzine (ATARAX)  10 MG tablet Take 1 tablet (10 mg total) by mouth at bedtime as needed.   methocarbamol (ROBAXIN) 500 MG tablet Take 2 tablets (1,000 mg total) by mouth every 8 (eight) hours as needed for muscle spasms.   metoprolol tartrate (LOPRESSOR) 25 MG tablet Take 1 tablet (25 mg total) by mouth 2 (two) times daily.   nitroGLYCERIN (NITROSTAT) 0.4 MG SL tablet Place 1 tablet (0.4 mg total) under the tongue every 5 (five) minutes x 3 doses as needed for chest pain.   omeprazole (PRILOSEC) 20 MG capsule TAKE ONE CAPSULE BY MOUTH DAILY   ondansetron (ZOFRAN-ODT) 4 MG disintegrating tablet Take 1 tablet (4 mg total) by mouth every 8 (eight) hours as needed for nausea or vomiting.    PEG-KCl-NaCl-NaSulf-Na Asc-C (PLENVU) 140 g SOLR Take 1 kit by mouth once for 1 dose. (Patient not taking: Reported on 04/02/2023)   Rimegepant Sulfate (NURTEC) 75 MG TBDP Take 1 tablet (75 mg total) by mouth daily as needed. For migraines. Take as close to onset of migraine as possible. One daily maximum.   rosuvastatin (CRESTOR) 10 MG tablet TAKE ONE TABLET BY MOUTH DAILY   [DISCONTINUED] levothyroxine (SYNTHROID) 75 MCG tablet Take 1 tablet (75 mcg total) by mouth daily.   [DISCONTINUED] venlafaxine XR (EFFEXOR-XR) 150 MG 24 hr capsule Take 1 capsule (150 mg total) by mouth daily.   amoxicillin-clavulanate (AUGMENTIN) 875-125 MG tablet Take 1 tablet by mouth every 12 (twelve) hours. (Patient not taking: Reported on 04/02/2023)   levothyroxine (SYNTHROID) 75 MCG tablet Take 1 tablet (75 mcg total) by mouth daily.   omega-3 acid ethyl esters (LOVAZA) 1 g capsule Take 1 capsule (1 g total) by mouth 2 (two) times daily.   venlafaxine XR (EFFEXOR-XR) 150 MG 24 hr capsule Take 1 capsule (150 mg total) by mouth daily.   Vitamin D, Ergocalciferol, (DRISDOL) 1.25 MG (50000 UNIT) CAPS capsule Take 1 capsule (50,000 Units total) by mouth every 7 (seven) days. (Patient not taking: Reported on 04/02/2023)   [DISCONTINUED] omega-3 acid ethyl esters (LOVAZA) 1 g capsule Take 1 capsule (1 g total) by mouth 2 (two) times daily.   No facility-administered encounter medications on file as of 04/02/2023.     Review of Systems  Review of Systems  Constitutional: Negative.   HENT: Negative.    Cardiovascular: Negative.   Gastrointestinal: Negative.   Allergic/Immunologic: Negative.   Neurological: Negative.   Psychiatric/Behavioral: Negative.         Physical Exam  BP 95/70   Pulse 83   Temp (!) 97 F (36.1 C)   Wt 198 lb 3.2 oz (89.9 kg)   SpO2 96%   BMI 38.71 kg/m   Wt Readings from Last 5 Encounters:  04/02/23 198 lb 3.2 oz (89.9 kg)  03/07/23 204 lb (92.5 kg)  02/15/23 216 lb 0.8 oz (98 kg)   02/14/23 216 lb 0.8 oz (98 kg)  12/11/22 216 lb 12.8 oz (98.3 kg)     Physical Exam Vitals and nursing note reviewed.  Constitutional:      General: She is not in acute distress.    Appearance: She is well-developed.  Cardiovascular:     Rate and Rhythm: Normal rate and regular rhythm.  Pulmonary:     Effort: Pulmonary effort is normal.     Breath sounds: Normal breath sounds.  Neurological:     Mental Status: She is alert and oriented to person, place, and time.      Lab Results:  CBC  Component Value Date/Time   WBC 7.9 02/15/2023 1344   RBC 4.45 02/15/2023 1344   HGB 13.2 02/15/2023 1344   HGB 13.4 07/13/2022 0858   HCT 39.6 02/15/2023 1344   HCT 41.4 07/13/2022 0858   PLT 329 02/15/2023 1344   PLT 323 07/13/2022 0858   MCV 89.0 02/15/2023 1344   MCV 92 07/13/2022 0858   MCH 29.7 02/15/2023 1344   MCHC 33.3 02/15/2023 1344   RDW 13.3 02/15/2023 1344   RDW 13.3 07/13/2022 0858   LYMPHSABS 3.6 12/04/2022 1010   LYMPHSABS 2.7 11/25/2020 1350   MONOABS 0.5 12/04/2022 1010   EOSABS 0.4 12/04/2022 1010   EOSABS 0.4 11/25/2020 1350   BASOSABS 0.0 12/04/2022 1010   BASOSABS 0.0 11/25/2020 1350    BMET    Component Value Date/Time   NA 137 02/15/2023 1344   NA 137 07/13/2022 0858   K 3.8 02/15/2023 1344   CL 104 02/15/2023 1344   CO2 22 02/15/2023 1344   GLUCOSE 147 (H) 02/15/2023 1344   BUN 17 02/15/2023 1344   BUN 15 07/13/2022 0858   CREATININE 0.87 02/15/2023 1344   CALCIUM 10.0 02/15/2023 1344   GFRNONAA >60 02/15/2023 1344   GFRAA 83 05/25/2020 1234      Assessment & Plan:   Hypertriglyceridemia - omega-3 acid ethyl esters (LOVAZA) 1 g capsule; Take 1 capsule (1 g total) by mouth 2 (two) times daily.  Dispense: 60 capsule; Refill: 3 - Lipid Panel  2. Adjustment disorder with mixed anxiety and depressed mood  - venlafaxine XR (EFFEXOR-XR) 150 MG 24 hr capsule; Take 1 capsule (150 mg total) by mouth daily.  Dispense: 30 capsule; Refill:  11  3. Encounter for screening mammogram for malignant neoplasm of breast  - MM Digital Screening  4. Lipid screening  - Lipid Panel  5. Hypothyroidism, unspecified type  - CBC - Comprehensive metabolic panel  Follow up:  Follow up in 6 months     Ivonne Andrew, NP 04/02/2023

## 2023-04-02 NOTE — Addendum Note (Signed)
Addended by: Ivonne Andrew on: 04/02/2023 04:10 PM   Modules accepted: Orders

## 2023-04-02 NOTE — Telephone Encounter (Signed)
Erroneous encounter

## 2023-04-02 NOTE — Assessment & Plan Note (Signed)
-   omega-3 acid ethyl esters (LOVAZA) 1 g capsule; Take 1 capsule (1 g total) by mouth 2 (two) times daily.  Dispense: 60 capsule; Refill: 3 - Lipid Panel  2. Adjustment disorder with mixed anxiety and depressed mood  - venlafaxine XR (EFFEXOR-XR) 150 MG 24 hr capsule; Take 1 capsule (150 mg total) by mouth daily.  Dispense: 30 capsule; Refill: 11  3. Encounter for screening mammogram for malignant neoplasm of breast  - MM Digital Screening  4. Lipid screening  - Lipid Panel  5. Hypothyroidism, unspecified type  - CBC - Comprehensive metabolic panel  Follow up:  Follow up in 6 months

## 2023-04-03 ENCOUNTER — Encounter: Payer: Medicaid Other | Admitting: Internal Medicine

## 2023-04-03 LAB — LIPID PANEL
Chol/HDL Ratio: 3.3 ratio (ref 0.0–4.4)
Cholesterol, Total: 111 mg/dL (ref 100–199)
HDL: 34 mg/dL — ABNORMAL LOW (ref >39)
LDL Chol Calc (NIH): 39 mg/dL (ref 0–99)
Triglycerides: 243 mg/dL — ABNORMAL HIGH (ref 0–149)
VLDL Cholesterol Cal: 38 mg/dL (ref 5–40)

## 2023-04-03 LAB — COMPREHENSIVE METABOLIC PANEL
ALT: 16 IU/L (ref 0–32)
AST: 25 IU/L (ref 0–40)
Albumin: 4.2 g/dL (ref 3.9–4.9)
Alkaline Phosphatase: 81 IU/L (ref 44–121)
BUN/Creatinine Ratio: 14 (ref 12–28)
BUN: 12 mg/dL (ref 8–27)
Bilirubin Total: 0.2 mg/dL (ref 0.0–1.2)
CO2: 19 mmol/L — ABNORMAL LOW (ref 20–29)
Calcium: 10.3 mg/dL (ref 8.7–10.3)
Chloride: 101 mmol/L (ref 96–106)
Creatinine, Ser: 0.83 mg/dL (ref 0.57–1.00)
Globulin, Total: 3 g/dL (ref 1.5–4.5)
Glucose: 161 mg/dL — ABNORMAL HIGH (ref 70–99)
Potassium: 5.5 mmol/L — ABNORMAL HIGH (ref 3.5–5.2)
Sodium: 136 mmol/L (ref 134–144)
Total Protein: 7.2 g/dL (ref 6.0–8.5)
eGFR: 79 mL/min/{1.73_m2} (ref >59)

## 2023-04-03 LAB — CBC
Hematocrit: 46.4 % (ref 34.0–46.6)
Hemoglobin: 14.8 g/dL (ref 11.1–15.9)
MCH: 28.7 pg (ref 26.6–33.0)
MCHC: 31.9 g/dL (ref 31.5–35.7)
MCV: 90 fL (ref 79–97)
Platelets: 349 10*3/uL (ref 150–450)
RBC: 5.15 x10E6/uL (ref 3.77–5.28)
RDW: 13.1 % (ref 11.7–15.4)
WBC: 7 10*3/uL (ref 3.4–10.8)

## 2023-04-04 ENCOUNTER — Other Ambulatory Visit: Payer: Self-pay | Admitting: Nurse Practitioner

## 2023-04-04 DIAGNOSIS — E875 Hyperkalemia: Secondary | ICD-10-CM

## 2023-04-05 ENCOUNTER — Other Ambulatory Visit: Payer: Self-pay

## 2023-04-05 DIAGNOSIS — G479 Sleep disorder, unspecified: Secondary | ICD-10-CM

## 2023-04-05 MED ORDER — HYDROXYZINE HCL 10 MG PO TABS: 10.0000 mg | ORAL_TABLET | Freq: Every evening | ORAL | 0 refills | Status: DC | PRN

## 2023-04-11 ENCOUNTER — Other Ambulatory Visit: Payer: Self-pay | Admitting: Nurse Practitioner

## 2023-04-11 DIAGNOSIS — G479 Sleep disorder, unspecified: Secondary | ICD-10-CM

## 2023-04-12 NOTE — Telephone Encounter (Signed)
Please advise KH 

## 2023-04-24 ENCOUNTER — Other Ambulatory Visit: Payer: Self-pay

## 2023-05-02 ENCOUNTER — Encounter: Payer: Medicaid Other | Admitting: Internal Medicine

## 2023-05-02 ENCOUNTER — Telehealth: Payer: Self-pay | Admitting: Internal Medicine

## 2023-05-02 NOTE — Telephone Encounter (Signed)
Patients son called and rescheduled stated the patient did not start her prep yesterday. Currently on for 05/15/23.

## 2023-05-02 NOTE — Telephone Encounter (Signed)
Noted  

## 2023-05-02 NOTE — Telephone Encounter (Signed)
Fyi.

## 2023-05-04 ENCOUNTER — Other Ambulatory Visit: Payer: Self-pay | Admitting: Nurse Practitioner

## 2023-05-04 DIAGNOSIS — E559 Vitamin D deficiency, unspecified: Secondary | ICD-10-CM

## 2023-05-07 ENCOUNTER — Other Ambulatory Visit: Payer: Self-pay | Admitting: Internal Medicine

## 2023-05-15 ENCOUNTER — Ambulatory Visit (AMBULATORY_SURGERY_CENTER): Payer: Medicaid Other | Admitting: Internal Medicine

## 2023-05-15 ENCOUNTER — Encounter: Payer: Self-pay | Admitting: Internal Medicine

## 2023-05-15 VITALS — BP 120/55 | HR 98 | Temp 96.0°F | Resp 20 | Ht 60.0 in | Wt 204.0 lb

## 2023-05-15 DIAGNOSIS — K769 Liver disease, unspecified: Secondary | ICD-10-CM

## 2023-05-15 DIAGNOSIS — Z1211 Encounter for screening for malignant neoplasm of colon: Secondary | ICD-10-CM

## 2023-05-15 DIAGNOSIS — D128 Benign neoplasm of rectum: Secondary | ICD-10-CM

## 2023-05-15 DIAGNOSIS — D125 Benign neoplasm of sigmoid colon: Secondary | ICD-10-CM | POA: Diagnosis not present

## 2023-05-15 DIAGNOSIS — Z800 Family history of malignant neoplasm of digestive organs: Secondary | ICD-10-CM

## 2023-05-15 DIAGNOSIS — K621 Rectal polyp: Secondary | ICD-10-CM

## 2023-05-15 MED ORDER — SODIUM CHLORIDE 0.9 % IV SOLN
500.0000 mL | Freq: Once | INTRAVENOUS | Status: DC
Start: 2023-05-15 — End: 2023-05-15

## 2023-05-15 NOTE — Progress Notes (Signed)
HISTORY OF PRESENT ILLNESS:  [name] is a 67 y.o. female with a family history of colon cancer.  Presents today for screening colonoscopy.  Negative exam 2016  REVIEW OF SYSTEMS:  All non-GI ROS negative except for  Past Medical History:  Diagnosis Date   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HCC)    Allergy    Amblyopia    Anemia    Arthritis    "in my lower back"   Asthma    Atrial myxoma    Blindness of left eye 1978   Blood transfusion    Breast cyst    Constipation    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HCC)    CVA (cerebral infarction)    Depression    Depression    GERD (gastroesophageal reflux disease)    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HCC)    Hemorrhoids    History of open heart surgery 1977   Hyperlipemia    Hypothyroidism    per office note dated 03/25/2014   Hypoxia    Migraine    Peptic ulcer associated with Helicobacter pylori infection    Reflux    Stroke (HCC) 1978   "piece tumor broke off & went to left eye; leaving me blind"    Past Surgical History:  Procedure Laterality Date   ADRENAL GLAND SURGERY  09/11/1980   on hydrocortisone   ATRIAL MYXOMA EXCISION  09/11/1976   BREAST BIOPSY  07/13/1999   ductal tissue excision & bx right; bx only left breast   COLONOSCOPY     EYE SURGERY     LEFT HEART CATHETERIZATION WITH CORONARY ANGIOGRAM N/A 10/06/2011   Procedure: LEFT HEART CATHETERIZATION WITH CORONARY ANGIOGRAM;  Surgeon: Robynn Pane, MD;  Location: [facility] CATH [facility];  Service: Cardiovascular;  Laterality: N/A;   ORBITOTOMY Right 07/27/2021   POLYPECTOMY     repaired cross eyed     "as a child"   ROTATOR CUFF REPAIR  05/12/2005   left   VAGINAL HYSTERECTOMY  09/11/1998   "partial; w/left ovary"    Social History JANAEH STEINHART  reports that she has never smoked. She has never used smokeless tobacco. She reports that she does not drink alcohol and does not use drugs.  family history includes Acromegaly in her father; Colon cancer (age of onset: 51) in her  sister; Diabetes in her sister; Heart disease in her daughter, father, and sister; Kidney disease in her sister; Stomach cancer in her father.  Allergies  Allergen Reactions   Aspirin Other (See Comments)    Can take baby aspirin (heart flutters with high doses)   Ibuprofen [Ibuprofen] Nausea And Vomiting   Penicillins Nausea And Vomiting    Has patient had a PCN reaction causing immediate rash, facial/tongue/throat swelling, SOB or lightheadedness with hypotension: Yes Has patient had a PCN reaction causing severe rash involving mucus membranes or skin necrosis: No Has patient had a PCN reaction that required hospitalization No Has patient had a PCN reaction occurring within the last 10 years: No If all of the above answers are "NO", then may proceed with Cephalosporin use.        PHYSICAL EXAMINATION: Vital signs: BP (!) 111/54   Pulse 94   Temp (!) 96 F (35.6 C) (Skin)   Ht 5' (1.524 m)   Wt 204 lb (92.5 kg)   SpO2 96%   BMI 39.84 kg/m  General: Well-developed, well-nourished, no acute distress HEENT: Sclerae are anicteric, conjunctiva pink. Oral mucosa intact Lungs: Clear Heart: Regular Abdomen: soft, nontender, nondistended, no  obvious ascites, no peritoneal signs, normal bowel sounds. No organomegaly. Extremities: No edema Psychiatric: alert and oriented x3. Cooperative     ASSESSMENT:  Colon cancer screening   PLAN:  Screening colonoscopy

## 2023-05-15 NOTE — Progress Notes (Signed)
Pt's states no medical or surgical changes since previsit or office visit. 

## 2023-05-15 NOTE — Op Note (Signed)
[facility] Patient Name: [name] Procedure Date: 05/15/2023 4:25 PM MRN: [id number] Endoscopist: Wilhemina Bonito. Marina Goodell , MD, [id number] Age: 67 Referring MD:  Date of Birth: 1956/05/07 Gender: Female Account #: [id number] Procedure:                Colonoscopy with cold snare x 1; bx polypectomy x 1 Indications:              Screening in patient at increased risk: Colorectal                            cancer in sister before age 65. Prior exam 2016 (-) Medicines:                Monitored Anesthesia Care Procedure:                Pre-Anesthesia Assessment:                           - Prior to the procedure, a History and Physical                            was performed, and patient medications and                            allergies were reviewed. The patient's tolerance of                            previous anesthesia was also reviewed. The risks                            and benefits of the procedure and the sedation                            options and risks were discussed with the patient.                            All questions were answered, and informed consent                            was obtained. Prior Anticoagulants: The patient has                            taken no anticoagulant or antiplatelet agents. ASA                            Grade Assessment: II - A patient with mild systemic                            disease. After reviewing the risks and benefits,                            the patient was deemed in satisfactory condition to                            undergo the procedure.  After obtaining informed consent, the colonoscope                            was passed under direct vision. Throughout the                            procedure, the patient's blood pressure, pulse, and                            oxygen saturations were monitored continuously. The                            Olympus Scope SN: [id number] was introduced  through                            the anus and advanced to the the cecum, identified                            by appendiceal orifice and ileocecal valve. The                            ileocecal valve, appendiceal orifice, and rectum                            were photographed. The quality of the bowel                            preparation was excellent. The colonoscopy was                            performed without difficulty. The patient tolerated                            the procedure well. The bowel preparation used was                            SUPREP via split dose instruction. Scope In: 4:32:44 PM Scope Out: 4:43:44 PM Scope Withdrawal Time: 0 hours 8 minutes 46 seconds  Total Procedure Duration: 0 hours 11 minutes 0 seconds  Findings:                 A 3 mm polyp was found in the sigmoid colon. The                            polyp was removed with a cold snare. Resection and                            retrieval were complete.                           A less than 1 mm polyp was found in the rectum. The                            polyp  was removed with a jumbo cold forceps.                            Resection and retrieval were complete.                           Multiple diverticula were found in the sigmoid                            colon.                           Internal hemorrhoids were found during                            retroflexion. The hemorrhoids were moderate.                           The exam was otherwise without abnormality on                            direct and retroflexion views. Complications:            No immediate complications. Estimated blood loss:                            None. Estimated Blood Loss:     Estimated blood loss: none. Impression:               - One 3 mm polyp in the sigmoid colon, removed with                            a cold snare. Resected and retrieved.                           - One less than 1 mm polyp in the  rectum, removed                            with a jumbo cold forceps. Resected and retrieved.                           - Diverticulosis in the sigmoid colon.                           - Internal hemorrhoids.                           - The examination was otherwise normal on direct                            and retroflexion views. Recommendation:           - Repeat colonoscopy in 5 years for surveillance                            (Fam Hx).                           -  Patient has a contact number available for                            emergencies. The signs and symptoms of potential                            delayed complications were discussed with the                            patient. Return to normal activities tomorrow.                            Written discharge instructions were provided to the                            patient.                           - Resume previous diet.                           - Continue present medications.                           - Await pathology results. Wilhemina Bonito. Marina Goodell, MD 05/15/2023 4:49:11 PM This report has been signed electronically.

## 2023-05-15 NOTE — Progress Notes (Signed)
Called to room to assist during endoscopic procedure.  Patient ID and intended procedure confirmed with present staff. Received instructions for my participation in the procedure from the performing physician.  

## 2023-05-15 NOTE — Progress Notes (Signed)
Sedate, gd SR, tolerated procedure well, VSS, report to RN 

## 2023-05-15 NOTE — Patient Instructions (Signed)
-  Handout on polyps and hemorrhoids provided -await pathology results -repeat colonoscopy in 5 years for surveillance recommended.  -Continue present medications   YOU HAD AN ENDOSCOPIC PROCEDURE TODAY AT THE [facility]:   Refer to the procedure report that was given to you for any specific questions about what was found during the examination.  If the procedure report does not answer your questions, please call your gastroenterologist to clarify.  If you requested that your care partner not be given the details of your procedure findings, then the procedure report has been included in a sealed envelope for you to review at your convenience later.  YOU SHOULD EXPECT: Some feelings of bloating in the abdomen. Passage of more gas than usual.  Walking can help get rid of the air that was put into your GI tract during the procedure and reduce the bloating. If you had a lower endoscopy (such as a colonoscopy or flexible sigmoidoscopy) you may notice spotting of blood in your stool or on the toilet paper. If you underwent a bowel prep for your procedure, you may not have a normal bowel movement for a few days.  Please Note:  You might notice some irritation and congestion in your nose or some drainage.  This is from the oxygen used during your procedure.  There is no need for concern and it should clear up in a day or so.  SYMPTOMS TO REPORT IMMEDIATELY:  Following lower endoscopy (colonoscopy or flexible sigmoidoscopy):  Excessive amounts of blood in the stool  Significant tenderness or worsening of abdominal pains  Swelling of the abdomen that is new, acute  Fever of 100F or higher   For urgent or emergent issues, a gastroenterologist can be reached at any hour by calling [phone]. Do not use MyChart messaging for urgent concerns.    DIET:  We do recommend a small meal at first, but then you may proceed to your regular diet.  Drink plenty of fluids but you should avoid  alcoholic beverages for 24 hours.  ACTIVITY:  You should plan to take it easy for the rest of today and you should NOT DRIVE or use heavy machinery until tomorrow (because of the sedation medicines used during the test).    FOLLOW UP: Our staff will call the number listed on your records the next business day following your procedure.  We will call around 7:15- 8:00 am to check on you and address any questions or concerns that you may have regarding the information given to you following your procedure. If we do not reach you, we will leave a message.     If any biopsies were taken you will be contacted by phone or by letter within the next 1-3 weeks.  Please call us at [phone] if you have not heard about the biopsies in 3 weeks.    SIGNATURES/CONFIDENTIALITY: You and/or your care partner have signed paperwork which will be entered into your electronic medical record.  These signatures attest to the fact that that the information above on your After Visit Summary has been reviewed and is understood.  Full responsibility of the confidentiality of this discharge information lies with you and/or your care-partner.

## 2023-05-16 ENCOUNTER — Telehealth: Payer: Self-pay | Admitting: *Deleted

## 2023-05-16 ENCOUNTER — Telehealth: Payer: Self-pay

## 2023-05-16 NOTE — Telephone Encounter (Signed)
Patient returning call. States she is having abdominal discomfort and is also feeling a little warm. States she is also having a bit if a head ache. Please advise.

## 2023-05-16 NOTE — Telephone Encounter (Signed)
[name], Not sure why she is having pain. If she is having severe abdominal pain, she should go to the ER immediately for evaluation. Please let her know. Thanks, Dr. Marina Goodell

## 2023-05-16 NOTE — Telephone Encounter (Signed)
Returned the patient's phone call. No answer left her a detailed message with the recommendations from Dr. Marina Goodell to go to the ED for evaluation if she is continuing to have 10/10 pain.

## 2023-05-16 NOTE — Telephone Encounter (Signed)
Returned the patient's phone call. She reports having 10/10 lower abdominal pain and feels "warm" after her Colonoscopy yesterday, ,they did not have a thermometer to check her temp. She reports that she has been drinking but not eating. Will let Dr. Marina Goodell know.

## 2023-05-16 NOTE — Telephone Encounter (Signed)
No answer, left message to call if having any issues or concerns, B.Schwartz RN 

## 2023-05-22 ENCOUNTER — Encounter: Payer: Self-pay | Admitting: Internal Medicine

## 2023-05-24 NOTE — Telephone Encounter (Signed)
Caller & Relationship to patient:  MRN #  [id number]   Call Back Number:   Date of Last Office Visit: 05/04/2023     Date of Next Office Visit: 07/04/2023    Medication(s) to be Refilled: Metroprolol  Preferred Pharmacy:   ** Please notify patient to allow 48-72 hours to process** **Let patient know to contact pharmacy at the end of the day to make sure medication is ready. ** **If patient has not been seen in a year or longer, book an appointment **Advise to use MyChart for refill requests OR to contact their pharmacy

## 2023-05-25 ENCOUNTER — Other Ambulatory Visit: Payer: Self-pay | Admitting: Internal Medicine

## 2023-05-25 ENCOUNTER — Other Ambulatory Visit: Payer: Self-pay | Admitting: Nurse Practitioner

## 2023-05-25 DIAGNOSIS — G479 Sleep disorder, unspecified: Secondary | ICD-10-CM

## 2023-05-25 NOTE — Telephone Encounter (Signed)
Please advise Kh

## 2023-05-28 ENCOUNTER — Other Ambulatory Visit: Payer: Self-pay | Admitting: Internal Medicine

## 2023-05-28 ENCOUNTER — Other Ambulatory Visit: Payer: Self-pay | Admitting: Nurse Practitioner

## 2023-05-31 ENCOUNTER — Other Ambulatory Visit: Payer: Self-pay | Admitting: Nurse Practitioner

## 2023-05-31 DIAGNOSIS — E559 Vitamin D deficiency, unspecified: Secondary | ICD-10-CM

## 2023-05-31 NOTE — Telephone Encounter (Signed)
Please advise Kh

## 2023-06-25 ENCOUNTER — Other Ambulatory Visit: Payer: Self-pay | Admitting: Nurse Practitioner

## 2023-06-25 DIAGNOSIS — E781 Pure hyperglyceridemia: Secondary | ICD-10-CM

## 2023-06-28 ENCOUNTER — Other Ambulatory Visit: Payer: Self-pay | Admitting: Nurse Practitioner

## 2023-06-28 DIAGNOSIS — E559 Vitamin D deficiency, unspecified: Secondary | ICD-10-CM

## 2023-06-28 DIAGNOSIS — G479 Sleep disorder, unspecified: Secondary | ICD-10-CM

## 2023-07-02 NOTE — Telephone Encounter (Signed)
Please advise KH 

## 2023-07-04 ENCOUNTER — Ambulatory Visit: Payer: Self-pay | Admitting: Nurse Practitioner

## 2023-07-10 ENCOUNTER — Ambulatory Visit: Payer: Medicaid Other | Attending: Internal Medicine | Admitting: Internal Medicine

## 2023-07-10 NOTE — Progress Notes (Deleted)
Cardiology Office Note:  .   Date:  07/10/2023  ID:  IDMAN VITTITOE, DOB Jul 24, 1956, MRN [id number] PCP: Ivonne Andrew, NP  [facility] Providers Cardiologist:  None Electrophysiologist:  Lewayne Bunting, MD    History of Present Illness: [name] is a 67 y.o. female.  Discussed the use of AI scribe software for clinical note transcription with the patient, who gave verbal consent to proceed.  History of Present Illness            ROS: negative except per HPI above.  Studies Reviewed: .        Results         Risk Assessment/Calculations:   {Does this patient have ATRIAL FIBRILLATION?:[id number]} No BP recorded.  {Refresh Note OR Click here to enter BP  :1}***       Physical Exam:   VS:  There were no vitals taken for this visit.   Wt Readings from Last 3 Encounters:  05/15/23 204 lb (92.5 kg)  04/02/23 198 lb 3.2 oz (89.9 kg)  03/07/23 204 lb (92.5 kg)     Physical Exam         GEN: Well nourished, well developed in no acute distress NECK: No JVD; No carotid bruits CARDIAC: ***RRR, no murmurs, rubs, gallops RESPIRATORY:  Clear to auscultation without rales, wheezing or rhonchi  ABDOMEN: Soft, non-tender, non-distended EXTREMITIES:  No edema; No deformity   ASSESSMENT AND PLAN: .    No diagnosis found.  Assessment and Plan                 {Are you ordering a CV Procedure (e.g. stress test, cath, DCCV, TEE, etc)?   Press F2        :[id number]}   Total time of encounter: *** minutes total time of encounter, including *** minutes spent in face-to-face patient care on the date of this encounter. This time includes coordination of care and counseling regarding above mentioned problem list. Remainder of non-face-to-face time involved reviewing chart documents/testing relevant to the patient encounter and documentation in the medical record. I have independently reviewed documentation from referring provider.   Weston Brass, MD,  [facility]  [facility]

## 2023-07-11 ENCOUNTER — Ambulatory Visit: Payer: Self-pay | Admitting: Nurse Practitioner

## 2023-07-13 ENCOUNTER — Other Ambulatory Visit: Payer: Self-pay | Admitting: Nurse Practitioner

## 2023-07-13 DIAGNOSIS — N289 Disorder of kidney and ureter, unspecified: Secondary | ICD-10-CM

## 2023-07-16 ENCOUNTER — Other Ambulatory Visit: Payer: Self-pay | Admitting: Internal Medicine

## 2023-07-24 ENCOUNTER — Other Ambulatory Visit: Payer: Self-pay | Admitting: Internal Medicine

## 2023-07-26 ENCOUNTER — Other Ambulatory Visit: Payer: Self-pay | Admitting: Nurse Practitioner

## 2023-07-26 DIAGNOSIS — G479 Sleep disorder, unspecified: Secondary | ICD-10-CM

## 2023-08-15 ENCOUNTER — Other Ambulatory Visit: Payer: Self-pay | Admitting: Nurse Practitioner

## 2023-08-17 ENCOUNTER — Other Ambulatory Visit: Payer: Self-pay | Admitting: Internal Medicine

## 2023-09-07 ENCOUNTER — Other Ambulatory Visit (HOSPITAL_COMMUNITY): Payer: Self-pay

## 2023-09-10 ENCOUNTER — Emergency Department (HOSPITAL_COMMUNITY)
Admission: EM | Admit: 2023-09-10 | Discharge: 2023-09-10 | Disposition: A | Discharge status: Home / Self Care | Payer: Medicaid Other

## 2023-09-10 ENCOUNTER — Emergency Department (HOSPITAL_COMMUNITY): Payer: Medicaid Other

## 2023-09-10 ENCOUNTER — Other Ambulatory Visit: Payer: Self-pay

## 2023-09-10 ENCOUNTER — Encounter (HOSPITAL_COMMUNITY): Payer: Self-pay

## 2023-09-10 ENCOUNTER — Other Ambulatory Visit: Payer: Self-pay | Admitting: Nurse Practitioner

## 2023-09-10 DIAGNOSIS — E039 Hypothyroidism, unspecified: Secondary | ICD-10-CM | POA: Diagnosis not present

## 2023-09-10 DIAGNOSIS — R7309 Other abnormal glucose: Secondary | ICD-10-CM | POA: Diagnosis not present

## 2023-09-10 DIAGNOSIS — Z7982 Long term (current) use of aspirin: Secondary | ICD-10-CM | POA: Insufficient documentation

## 2023-09-10 DIAGNOSIS — E871 Hypo-osmolality and hyponatremia: Secondary | ICD-10-CM | POA: Insufficient documentation

## 2023-09-10 DIAGNOSIS — G479 Sleep disorder, unspecified: Secondary | ICD-10-CM

## 2023-09-10 DIAGNOSIS — R718 Other abnormality of red blood cells: Secondary | ICD-10-CM | POA: Insufficient documentation

## 2023-09-10 DIAGNOSIS — Z79899 Other long term (current) drug therapy: Secondary | ICD-10-CM | POA: Diagnosis not present

## 2023-09-10 DIAGNOSIS — R0789 Other chest pain: Secondary | ICD-10-CM | POA: Insufficient documentation

## 2023-09-10 LAB — HEPATIC FUNCTION PANEL
ALT: 18 U/L (ref 0–44)
AST: 23 U/L (ref 15–41)
Albumin: 3.9 g/dL (ref 3.5–5.0)
Alkaline Phosphatase: 59 U/L (ref 38–126)
Bilirubin, Direct: <0.1 mg/dL (ref 0.0–0.2)
Total Bilirubin: 0.6 mg/dL (ref <1.2)
Total Protein: 8.1 g/dL (ref 6.5–8.1)

## 2023-09-10 LAB — BASIC METABOLIC PANEL
Anion gap: 14 (ref 5–15)
BUN: 21 mg/dL (ref 8–23)
CO2: 21 mmol/L — ABNORMAL LOW (ref 22–32)
Calcium: 10.5 mg/dL — ABNORMAL HIGH (ref 8.9–10.3)
Chloride: 98 mmol/L (ref 98–111)
Creatinine, Ser: 0.92 mg/dL (ref 0.44–1.00)
GFR, Estimated: >60 mL/min (ref >60)
Glucose, Bld: 145 mg/dL — ABNORMAL HIGH (ref 70–99)
Potassium: 4.6 mmol/L (ref 3.5–5.1)
Sodium: 133 mmol/L — ABNORMAL LOW (ref 135–145)

## 2023-09-10 LAB — CBC
HCT: 47.1 % — ABNORMAL HIGH (ref 36.0–46.0)
Hemoglobin: 15.5 g/dL — ABNORMAL HIGH (ref 12.0–15.0)
MCH: 29.4 pg (ref 26.0–34.0)
MCHC: 32.9 g/dL (ref 30.0–36.0)
MCV: 89.2 fL (ref 80.0–100.0)
Platelets: 422 10*3/uL — ABNORMAL HIGH (ref 150–400)
RBC: 5.28 MIL/uL — ABNORMAL HIGH (ref 3.87–5.11)
RDW: 13.2 % (ref 11.5–15.5)
WBC: 8.5 10*3/uL (ref 4.0–10.5)
nRBC: 0 % (ref 0.0–0.2)

## 2023-09-10 LAB — LIPASE, BLOOD: Lipase: 24 U/L (ref 11–51)

## 2023-09-10 LAB — TROPONIN I (HIGH SENSITIVITY)
Troponin I (High Sensitivity): <2 ng/L (ref <18)
Troponin I (High Sensitivity): 3 ng/L (ref <18)

## 2023-09-10 MED ORDER — ONDANSETRON 4 MG PO TBDP
4.0000 mg | ORAL_TABLET | Freq: Once | ORAL | Status: AC
  Administered 2023-09-10: 4 mg via ORAL
  Filled 2023-09-10: qty 1

## 2023-09-10 MED ORDER — ALUMINUM-MAGNESIUM-SIMETHICONE 200-200-20 MG/5ML PO SUSP: 30.0000 mL | Freq: Three times a day (TID) | ORAL | 1 refills | Status: DC

## 2023-09-10 MED ORDER — ALUM & MAG HYDROXIDE-SIMETH 200-200-20 MG/5ML PO SUSP
30.0000 mL | Freq: Once | ORAL | Status: AC
  Administered 2023-09-10: 30 mL via ORAL
  Filled 2023-09-10: qty 30

## 2023-09-10 NOTE — ED Notes (Signed)
Pt reports improvement in symptoms post GI cocktail. Pt pain decreased from 7 to 3. Passed fluid challenge.

## 2023-09-10 NOTE — ED Triage Notes (Signed)
Patient arrived by [facility] from home with complaint of not feeling well yesterday and today. Nausea yesterday and today cp with nausea. Patient alert and oriented Received aspirin 324mg 

## 2023-09-10 NOTE — ED Provider Notes (Signed)
[facility] EM[facility]ENC[facility] DEPARTMENT AT [facility] Provider Note   CSN: [id number] Arrival date & time: 09/10/23  0865     History  Chief Complaint  Patient presents with   Chest Pain    [name] is a 67 y.o. female.  Patient with history of migraine headache, atypical chest pains, depression, hypothyroidism, GERD, remote CVA--presents today to the emergency department for evaluation of chest discomfort.  She describes this as a heaviness in her mid chest.  It started after she awoke this morning.  It occurred at rest.  It did not radiate.  No associated vomiting or diaphoresis but did feel nauseous and lightheaded.  She did not pass out.  No associated fevers or coughing. Patient denies risk factors for pulmonary embolism including: unilateral leg swelling, history of DVT/PE/other blood clots, use of exogenous hormones, recent immobilizations, recent surgery, recent travel (>4hr segment), malignancy, hemoptysis.  Nothing really made the symptoms worse.  Not really change with eating or drinking.       Home Medications Prior to Admission medications   Medication Sig Start Date End Date Taking? Authorizing Provider  aspirin EC 81 MG tablet Take 81 mg by mouth daily. Swallow whole.    [provider]  fenofibrate (TRICOR) 48 MG tablet TAKE ONE TABLET BY MOUTH EVERY DAY 01/26/23   Marinus Maw, MD  flecainide (TAMBOCOR) 50 MG tablet Take 1 tablet (50 mg total) by mouth 2 (two) times daily. 05/09/23   Marinus Maw, MD  Galcanezumab-gnlm [facility]) 120 MG/ML SOAJ Inject 120 mg into the skin every 30 (thirty) days. Patient taking differently: Inject into the skin every 30 (thirty) days. 10/19/22   Ihor Austin, NP  HYDROcodone-acetaminophen (NORCO/VICODIN) 5-325 MG tablet Take 1 tablet by mouth every 6 (six) hours as needed for severe pain. 01/27/23   Renne Crigler, PA-C  hydrocortisone (CORTEF) 10 MG tablet take 15 MG (1&1/2 tablets) with Breakfast AND 10 MG  (ONE tablet) between 2-4 as needed daily] 08/17/23   Shamleffer, Konrad Dolores, MD  hydrOXYzine (ATARAX) 10 MG tablet TAKE ONE TABLET BY MOUTH AT BEDTIME AS NEEDED 07/26/23   Ivonne Andrew, NP  levothyroxine (SYNTHROID) 75 MCG tablet TAKE ONE TABLET ( ) BY MOUTH EVERY DAY 08/15/23   Ivonne Andrew, NP  methocarbamol (ROBAXIN) 500 MG tablet Take 2 tablets (1,000 mg total) by mouth every 8 (eight) hours as needed for muscle spasms. 01/27/23   Renne Crigler, PA-C  metoprolol tartrate (LOPRESSOR) 25 MG tablet Take 1 tablet (25 mg total) by mouth 2 (two) times daily. 11/10/22   Benjiman Core, MD  nitroGLYCERIN (NITROSTAT) 0.4 MG SL tablet Place 1 tablet (0.4 mg total) under the tongue every 5 (five) minutes x 3 doses as needed for chest pain. Patient not taking: Reported on 05/15/2023 12/11/22   Ivonne Andrew, NP  omega-3 acid ethyl esters (LOVAZA) 1 g capsule Take 1 capsule (1 g total) by mouth 2 (two) times daily. 06/25/23 06/24/24  Ivonne Andrew, NP  omeprazole (PRILOSEC) 20 MG capsule TAKE ONE CAPSULE BY MOUTH EVERY DAY 07/26/23   Ivonne Andrew, NP  ondansetron (ZOFRAN-ODT) 4 MG disintegrating tablet Take 1 tablet (4 mg total) by mouth every 8 (eight) hours as needed for nausea or vomiting. Patient not taking: Reported on 05/15/2023 02/15/23   Smitty Knudsen, PA-C  Rimegepant Sulfate (NURTEC) 75 MG TBDP Take 1 tablet (75 mg total) by mouth daily as needed. For migraines. Take as close to onset of migraine  as possible. One daily maximum. 10/19/22   Ihor Austin, NP  rosuvastatin (CRESTOR) 10 MG tablet TAKE ONE TABLET BY MOUTH DAILY 12/14/22   Ivonne Andrew, NP  venlafaxine XR (EFFEXOR-XR) 150 MG 24 hr capsule Take 1 capsule (150 mg total) by mouth daily. 04/02/23 04/01/24  Ivonne Andrew, NP  Vitamin D, Ergocalciferol, 50000 units CAPS TAKE ONE CAPSULE BY MOUTH EVERY 7 DAYS 07/02/23   Ivonne Andrew, NP      Allergies    Aspirin, Ibuprofen [ibuprofen], and Penicillins    Review of  Systems   Review of Systems  Physical Exam Updated Vital Signs BP 110/87   Pulse (!) 101   Temp 98.3 F (36.8 C) (Oral)   Resp 18   SpO2 95%   Physical Exam Vitals and nursing note reviewed.  Constitutional:      Appearance: She is well-developed. She is not diaphoretic.  HENT:     Head: Normocephalic and atraumatic.     Mouth/Throat:     Mouth: Mucous membranes are not dry.  Eyes:     Conjunctiva/sclera: Conjunctivae normal.  Neck:     Vascular: Normal carotid pulses. No JVD.     Trachea: Trachea normal. No tracheal deviation.  Cardiovascular:     Rate and Rhythm: Normal rate and regular rhythm.     Pulses: No decreased pulses.          Radial pulses are 2+ on the right side and 2+ on the left side.     Heart sounds: Normal heart sounds, S1 normal and S2 normal. No murmur heard.    Comments: Heart rate upper 90s Pulmonary:     Effort: Pulmonary effort is normal. No respiratory distress.     Breath sounds: No wheezing.  Chest:     Chest wall: No tenderness.  Abdominal:     General: Bowel sounds are normal.     Palpations: Abdomen is soft.     Tenderness: There is no abdominal tenderness. There is no guarding or rebound.  Musculoskeletal:        General: Normal range of motion.     Cervical back: Normal range of motion and neck supple. No muscular tenderness.     Right lower leg: No tenderness. No edema.     Left lower leg: No tenderness. No edema.     Comments: No clinical signs and symptoms of DVT  Skin:    General: Skin is warm and dry.     Coloration: Skin is not pale.  Neurological:     Mental Status: She is alert.     ED Results / Procedures / Treatments   Labs (all labs ordered are listed, but only abnormal results are displayed) Labs Reviewed  BASIC METABOLIC PANEL - Abnormal; Notable for the following components:      Result Value   Sodium 133 (*)    CO2 21 (*)    Glucose, Bld 145 (*)    Calcium 10.5 (*)    All other components within normal  limits  CBC - Abnormal; Notable for the following components:   RBC 5.28 (*)    Hemoglobin 15.5 (*)    HCT 47.1 (*)    Platelets 422 (*)    All other components within normal limits  LIPASE, BLOOD  HEPATIC FUNCTION PANEL  TROPONIN I (HIGH SENSITIVITY)  TROPONIN I (HIGH SENSITIVITY)    EKG EKG Interpretation Date/Time:  Monday September 10 2023 08:27:25 EST Ventricular Rate:  96 PR Interval:  160 QRS Duration:  90 QT Interval:  356 QTC Calculation: 449 R Axis:   77  Text Interpretation: Normal sinus rhythm Nonspecific T wave abnormality Abnormal ECG When compared with ECG of 15-Feb-2023 13:37, PREVIOUS ECG IS PRESENT Confirmed by Alona Bene [phone]) on 09/10/2023 8:41:27 AM  Radiology DG Chest 2 View Result Date: 09/10/2023 CLINICAL DATA:  67 year old female with chest pain. EXAM: CHEST - 2 VIEW COMPARISON:  Portable chest 02/15/2023 and earlier. FINDINGS: PA and lateral views 0906 hours. Chronic sternotomy, epicardial pacer wires. Lung volumes and mediastinal contours are within normal limits. Visualized tracheal air column is within normal limits. No pneumothorax, pulmonary edema, pleural effusion or confluent lung opacity. No acute osseous abnormality identified. Numerous surgical clips in the retroperitoneum, paraspinal abdomen. Negative visible bowel gas. IMPRESSION: No acute cardiopulmonary abnormality. Chronic sternotomy, retained epicardial pacer wires. Electronically Signed   By: Odessa Fleming M.D.   On: 09/10/2023 09:21    Procedures Procedures    Medications Ordered in ED Medications  ondansetron (ZOFRAN-ODT) disintegrating tablet 4 mg (4 mg Oral Given 09/10/23 0852)  alum & mag hydroxide-simeth (MAALOX/MYLANTA) 200-200-20 MG/5ML suspension 30 mL (30 mLs Oral Given 09/10/23 1242)    ED Course/ Medical Decision Making/ A&P    Patient seen and examined. History obtained directly from patient. Work-up including labs, imaging, EKG ordered in triage, if performed, were  reviewed.    Labs/EKG: Independently reviewed and interpreted.  This included: CBC with mildly elevated hemoglobin at 15.5, normal white blood cell count at 8.5, otherwise unremarkable; BMP was lightly low sodium at 133, glucose 145 with a bicarb of 21, normal anion gap, mildly elevated calcium at 10.5; first troponin was normal at less than 2.  Lipase was normal.  Imaging: Independently visualized and interpreted.  This included: Chest x-ray, postsurgical changes noted (patient has a history of atrial myxoma removal), agree no infiltrate  Medications/Fluids: Ordered: GI cocktail  Most recent vital signs reviewed and are as follows: BP 110/87   Pulse (!) 101   Temp 98.3 F (36.8 C) (Oral)   Resp 18   SpO2 95%   Initial impression: Awaiting second troponin and repeat EKG.  Will reassess afterwards.  Overall patient looks well, likely low risk for ACS, PE.  No evidence of pneumonia.  2:57 PM Reassessment performed. Patient appears stable, comfortable.  Repeat EKG is unchanged.  Patient states that she feels much better after drinking the Maalox.  Heaviness nearly resolved.  Encouraged patient to continue home omeprazole.  Labs personally reviewed and interpreted including: Second troponin normal at 3.  Reviewed pertinent lab work and imaging with patient at bedside. Questions answered.   Most current vital signs reviewed and are as follows: BP 106/84 (BP Location: Right Arm)   Pulse 99   Temp 98.3 F (36.8 C) (Oral)   Resp 16   SpO2 93%   Plan: Discharge to home.   Prescriptions written for: Maalox  Return and follow-up instructions: I encouraged patient to return to ED with severe chest pain, especially if the pain is crushing or pressure-like and spreads to the arms, back, neck, or jaw, or if they have associated sweating, vomiting, or shortness of breath with the pain, or significant pain with activity. We discussed that the evaluation here today indicates a low-risk of serious  cause of chest pain, including heart trouble or a blood clot, but no evaluation is perfect and chest pain can evolve with time. The patient verbalized understanding and agreed.  I encouraged patient to  follow-up with their provider in the next 48 hours for recheck.                                   Medical Decision Making Amount and/or Complexity of Data Reviewed Labs: ordered. Radiology: ordered.  Risk OTC drugs.   For this patient's complaint of chest pain, the following emergent conditions were considered on the differential diagnosis: acute coronary syndrome, pulmonary embolism, pneumothorax, myocarditis, pericardial tamponade, aortic dissection, thoracic aortic aneurysm complication, esophageal perforation.   Other causes were also considered including: gastroesophageal reflux disease, musculoskeletal pain including costochondritis, pneumonia/pleurisy, herpes zoster, pericarditis.  In regards to possibility of ACS, patient has atypical features of pain, unchanged EKG and negative troponin(s).    In regards to possibility of PE, symptoms are atypical for PE and risk profile is low, making PE low likelihood.   Patient treated in ED with Maalox, with much improvement in her symptoms.  This is suggestive of possible GI etiology.  No hypoxia.  EKG is not normal but unchanged during ED stay and when compared to previous.  Patient comfortable with going home with close PCP follow-up.  The patient's vital signs, pertinent lab work and imaging were reviewed and interpreted as discussed in the ED course. Hospitalization was considered for further testing, treatments, or serial exams/observation. However as patient is well-appearing, has a stable exam, and reassuring studies today, I do not feel that they warrant admission at this time. This plan was discussed with the patient who verbalizes agreement and comfort with this plan and seems reliable and able to return to the Emergency Department with  worsening or changing symptoms.           Final Clinical Impression(s) / ED Diagnoses Final diagnoses:  Atypical chest pain    Rx / DC Orders ED Discharge Orders          Ordered    aluminum-magnesium hydroxide-simethicone (MAALOX) 200-200-20 MG/5ML SUSP  3 times daily before meals & bedtime        09/10/23 1447              Renne Crigler, PA-C 09/10/23 1500    Coral Spikes, DO 09/10/23 1534

## 2023-09-10 NOTE — ED Provider Triage Note (Signed)
Emergency Medicine Provider Triage Evaluation Note  MAHINA PETRUSKA , a 67 y.o. female  was evaluated in triage.  Pt complains of CP starting this AM. She has associated nausea and lightheadedness. No fever or chills.   Review of Systems  Positive: CP, nausea, dizziness Negative: Abdominal pain, vomiting, diarrhea  Physical Exam  BP 111/84 (BP Location: Right Arm)   Pulse (!) 105   Temp 98.3 F (36.8 C) (Oral)   Resp (!) 24   SpO2 96%  Gen:   Awake, no distress   Resp:  Normal effort  MSK:   Moves extremities without difficulty    Medical Decision Making  Medically screening exam initiated at 8:39 AM.  Appropriate orders placed.  [name] was informed that the remainder of the evaluation will be completed by another provider, this initial triage assessment does not replace that evaluation, and the importance of remaining in the ED until their evaluation is complete.    Maia Plan, MD 09/10/23 [phone]

## 2023-09-10 NOTE — Discharge Instructions (Addendum)
Please read and follow all provided instructions.  Your diagnoses today include:  1. Atypical chest pain     Tests performed today include: An EKG of your heart A chest x-ray Cardiac enzymes - a blood test for heart muscle damage, did not show signs of a heart attack Blood counts and electrolytes Vital signs. See below for your results today.   Medications prescribed:  Maalox  Take any prescribed medications only as directed.  Follow-up instructions: Please follow-up with your primary care provider as soon as you can for further evaluation of your symptoms.   Return instructions:  SEEK IMMEDIATE MEDICAL ATTENTION IF: You have severe chest pain, especially if the pain is crushing or pressure-like and spreads to the arms, back, neck, or jaw, or if you have sweating, nausea or vomiting, or trouble with breathing. THIS IS AN EMERGENCY. Do not wait to see if the pain will go away. Get medical help at once. Call 911. DO NOT drive yourself to the hospital.  Your chest pain gets worse and does not go away after a few minutes of rest.  You have an attack of chest pain lasting longer than what you usually experience.  You have significant dizziness, if you pass out, or have trouble walking.  You have chest pain not typical of your usual pain for which you originally saw your caregiver.  You have any other emergent concerns regarding your health.  Additional Information: Chest pain comes from many different causes. Your caregiver has diagnosed you as having chest pain that is not specific for one problem, but does not require admission.  You are at low risk for an acute heart condition or other serious illness.   Your vital signs today were: BP 106/84 (BP Location: Right Arm)   Pulse 99   Temp 98.3 F (36.8 C) (Oral)   Resp 16   SpO2 93%  If your blood pressure (BP) was elevated above 135/85 this visit, please have this repeated by your doctor within one month. --------------

## 2023-09-18 ENCOUNTER — Other Ambulatory Visit: Payer: Self-pay | Admitting: Internal Medicine

## 2023-09-19 NOTE — Telephone Encounter (Signed)
 This is a pt last seen by Dr. Ladona Ridgel. She has had her 3 attempts. Does Dr. Ladona Ridgel want to refill? Please advise

## 2023-10-05 ENCOUNTER — Other Ambulatory Visit: Payer: Self-pay | Admitting: Internal Medicine

## 2023-10-05 ENCOUNTER — Other Ambulatory Visit: Payer: Self-pay

## 2023-10-05 DIAGNOSIS — E781 Pure hyperglyceridemia: Secondary | ICD-10-CM

## 2023-10-05 NOTE — Telephone Encounter (Signed)
Please advise [facility]

## 2023-10-12 ENCOUNTER — Other Ambulatory Visit (HOSPITAL_COMMUNITY): Payer: Self-pay

## 2023-10-25 ENCOUNTER — Encounter: Payer: Self-pay | Admitting: Adult Health

## 2023-10-25 ENCOUNTER — Ambulatory Visit: Payer: Medicare Other | Admitting: Adult Health

## 2023-10-25 ENCOUNTER — Other Ambulatory Visit: Payer: Self-pay

## 2023-10-25 VITALS — BP 102/77 | HR 107 | Ht 60.0 in | Wt 180.6 lb

## 2023-10-25 DIAGNOSIS — G43711 Chronic migraine without aura, intractable, with status migrainosus: Secondary | ICD-10-CM | POA: Diagnosis not present

## 2023-10-25 MED ORDER — OMEPRAZOLE 20 MG PO CPDR: 20.0000 mg | DELAYED_RELEASE_CAPSULE | Freq: Every day | ORAL | 0 refills | Status: DC

## 2023-10-25 MED ORDER — EMGALITY 120 MG/ML ~~LOC~~ SOAJ: 120.0000 mg | SUBCUTANEOUS | 11 refills | Status: AC

## 2023-10-25 MED ORDER — NURTEC 75 MG PO TBDP: 75.0000 mg | ORAL_TABLET | Freq: Every day | ORAL | 11 refills | Status: AC | PRN

## 2023-10-25 NOTE — Progress Notes (Signed)
[facility]    Primary neurologist:  Dr Lucia Gaskins Primary Care Provider:  Ivonne Andrew, NP Reason for visit: Migraine follow-up   Chief Complaint  Patient presents with   Follow-up    Pt in room 3. Son in room. Here for migraine follow up. Pt states she doesn't remember to use Emgality. Pt doesn't use monthly, but does Emagality does help with using.       HPI:   Update 10/25/2023 JM: Patient returns for follow-up visit accompanied by her son.  She does not use Emgality consistently every month, may not use for 1 month if she hasn't had any migraines and other times she may forget to do the injection.  When she is consistently on Emgality, migraines are well-controlled.  She has had increased migraines over the past month.  Use of Nurtec with benefit.     History provided for reference purposes only Update 10/19/2022 JM: Patient returns for migraine follow-up visit.  She has remained on Emgality with great control of migraine frequency, currently experiencing 1 migraine day per month.  Use of Nurtec with benefit.  Was seen by Dr. Vickey Huger for possible underlying sleep apnea in 02/2022, ordered sleep study at that time but she no showed sleep study in 03/2022. She does not wish to pursue further sleep testing at this time.   Does report issues with chronic lower back pain and bilateral foot pain. Did see PCP back in November who recommended xray and PT but has not yet pursued these.  Update 10/13/2021 JM: returns for migraine follow up accompanied by her son, Thayer Ohm.  Previously saw Dr. Lucia Gaskins on 05/25/2020 for initial consult visit.  Started on [employer]	[place].  Reports migraine headaches currently occurring x1 per month (previously daily). Will use Nurtec with great benefit.  Tolerating both well without side effects. Did not complete sleep study as previously ordered. Does continue to have insomnia and daytime fatigue.  No new concerns at this time.   Consult visit  05/25/2020 Dr. Lucia Gaskins:  [name] is a 68 y.o. female here as requested by Ivonne Andrew, NP for migraines.  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 I reviewed epic notes, patient was seen in the emergency room in July of this year for migraine: She had a sudden onset constant diffuse chest pain, the night before she started having migraine headaches, she tried to go to sleep and ignore it however it kept her up around 5 AM she took a Fioricet, when she woke up up later in the morning and she reported the headache had resolved. she also reported other symptoms such as shortness of breath, nausea, emesis, diaphoresis, EMS was called, pain started with a migraine headache but then started having chest pain after taking Fioricet, when seen in the emergency room she was not have a headache and was there for more for the other symptoms.  It appears as though she was also admitted back in January for Covid positive and migraine, with acute hypoxic respiratory failure, non-intractable migraine, acute kidney injury, chronic adrenal insufficiency on home hydrocortisone, hyperlipidemia, hypothyroidism, depression and previous CVA (or MI really unclear).  Migraines started at the age of 6 after bing it in the head. Here with her son who also provides much information. The older she gets the worse she gets. Headaches start in the thr  front of the head, continuous headache. Never had a sleep test. Wakes with dry mouth in the morning. Wakes up with headaches every day. Headaches every day, continuous just waxes and wanes all day, worsening 6 months ago with intractable headaches, no trauma, nothing maes it better or worse. Starts on the right side if a migraines or on the top, pulsating, pounding throbbing, light and sound and smell,  significant nausea, vomiting, movement makes it worse, it is positional worse in the morning, can wake her up at night, she is also having vision changes in her right eye movement and changing positions make it worse. 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 Migraines daily as well.  Headaches daily.  Continuous headache.  No medication overuse, denies.  No aura.  Ongoing for years but worsening over the last 6 months with vision changes, nocturnal and morning headaches, positional quality.  She is tried and failed multiple medications including preventatives in acute.  We discussed her options, feel an MRI of the brain is warranted, would not hesitate to start Botox and we will initiate CGRP. No other focal neurologic deficits, associated symptoms, inciting events or modifiable factors.  Reviewed notes, labs and imaging from outside physicians, which showed:    CT head 09/2019: IMPRESSION: Personally reviewed and agree with the following: 1. No acute intracranial abnormalities. 2. Significant mucosal thickening throughout the paranasal sinuses as above with no air-fluid levels.   Review of Systems: Patient complains of symptoms per HPI as well as the following symptoms: Those listed in HPI.  Pertinent negatives and positives per HPI. All others negative.   Social History   Socioeconomic History   Marital status: Married    Spouse name: Simona Huh   Number of children: 2   Years of education: Not on file   Highest education level: Some college, no degree  Occupational History   Occupation: Disabled  Tobacco Use   Smoking status: Never   Smokeless tobacco: Never  Vaping Use   Vaping status: Never Used  Substance and Sexual Activity   Alcohol use: No   Drug use: No   Sexual activity: Not Currently  Other Topics Concern   Not on file  Social  History Narrative   Lives w husband and son   Left handed   Caffeine: none    Social Drivers of Corporate investment banker Strain: Not on file  Food Insecurity: Not on file  Transportation Needs: Not on file  Physical Activity: Not on file  Stress: Not on file  Social Connections: Not on file  Intimate Partner Violence: Not on file    Family History  Problem Relation Age of Onset   Acromegaly Father    Heart disease Father        Tumor in the heart   Stomach cancer Father    Colon cancer Sister 44   Diabetes Sister    Kidney disease Sister        x2   Heart disease Sister        Tumor in the heart   Heart disease Daughter        Tumor in the heart   Colon polyps Neg Hx    Esophageal cancer Neg Hx    Gallbladder disease Neg Hx    Migraines Neg Hx  Rectal cancer Neg Hx     Past Medical History:  Diagnosis Date   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HCC)    Allergy    Amblyopia    Anemia    Arthritis    "in my lower back"   Asthma    Atrial myxoma    Blindness of left eye 1978   Blood transfusion    Breast cyst    Constipation    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HCC)    CVA (cerebral infarction)    Depression    Depression    GERD (gastroesophageal reflux disease)    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HCC)    Hemorrhoids    History of open heart surgery 1977   Hyperlipemia    Hypothyroidism    per office note dated 03/25/2014   Hypoxia    Migraine    Peptic ulcer associated with Helicobacter pylori infection    Reflux    Stroke (HCC) 1978   "piece tumor broke off & went to left eye; leaving me blind"    Patient Active Problem List   Diagnosis Date Noted   Hypertriglyceridemia 04/02/2023   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HCC) 07/21/2022   Chronic bilateral low back pain with right-sided sciatica 07/13/2022   Vitamin D deficiency 04/03/2022   Intractable episodic cluster headache 02/22/2022   History of COVID-19 02/22/2022   Morbid obesity with BMI of 40.0-44.9,  adult (HCC) 02/22/2022   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   Hypersomnia with sleep apnea 02/22/2022   Psychophysiological insomnia 02/22/2022   Witnessed episode of apnea 02/22/2022   Abnormal CT of the abdomen 01/30/2022   Snoring 01/05/2022   AKI (acute kidney injury) (HCC) 12/24/2021   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   NSVT (nonsustained ventricular tachycardia) (HCC) 03/08/2021   New-onset angina (HCC) 12/04/2020   Chronic migraine without aura, with intractable migraine, so stated, with status migrainosus 05/25/2020   Frequent PVCs    COVID-19 10/10/2019   Hypoxia    Migraine with aura and without status migrainosus, not intractable    Tachycardia    ARF (acute renal failure) (HCC) 12/06/2018   Fever 12/06/2018   Acute hypoxemic respiratory failure (HCC) 12/06/2018   Headache 11/02/2013   Dizziness 11/02/2013   Anemia 01/10/2013   Chest pain, unspecified 01/10/2013   Adjustment disorder with mixed anxiety and depressed mood 01/10/2013   Adrenal crisis (HCC) 01/07/2013   Nausea & vomiting 08/03/2011   Hypotension 08/03/2011   Adrenal insufficiency (HCC) 08/03/2011   Hyponatremia 08/03/2011   Hypothyroidism 08/03/2011   Depression 08/03/2011    Past Surgical History:  Procedure Laterality Date   ADRENAL GLAND SURGERY  09/11/1980   on hydrocortisone   ATRIAL MYXOMA EXCISION  09/11/1976   BREAST BIOPSY  07/13/1999   ductal tissue excision & bx right; bx only left breast   COLONOSCOPY     EYE SURGERY     LEFT HEART CATHETERIZATION WITH CORONARY ANGIOGRAM N/A 10/06/2011   Procedure: LEFT HEART CATHETERIZATION WITH CORONARY ANGIOGRAM;  Surgeon: Robynn Pane, MD;  Location: [facility] CATH [facility];  Service: Cardiovascular;  Laterality: N/A;   ORBITOTOMY Right 07/27/2021   POLYPECTOMY     repaired cross eyed     "as a child"   ROTATOR CUFF REPAIR  05/12/2005   left   VAGINAL HYSTERECTOMY  09/11/1998   "partial; w/left ovary"    Current Outpatient Medications  Medication Sig  Dispense Refill   aspirin EC 81 MG tablet Take 81 mg by mouth daily. Swallow whole.     fenofibrate (TRICOR) 48 MG  tablet TAKE ONE TABLET BY MOUTH EVERY DAY 15 tablet 0   Galcanezumab-gnlm (EMGALITY) 120 MG/ML SOAJ Inject 120 mg into the skin every 30 (thirty) days. 1 mL 11   HYDROcodone-acetaminophen (NORCO/VICODIN) 5-325 MG tablet Take 1 tablet by mouth every 6 (six) hours as needed for severe pain. 6 tablet 0   hydrocortisone (CORTEF) 10 MG tablet take 15 MG (1&1/2 tablets) with Breakfast AND 10 MG (ONE tablet) between 2-4 as needed daily] 90 tablet 0   hydrOXYzine (ATARAX) 10 MG tablet TAKE ONE TABLET BY MOUTH AT BEDTIME AS NEEDED 30 tablet 0   levothyroxine (SYNTHROID) 75 MCG tablet TAKE ONE TABLET ( ) BY MOUTH EVERY DAY 30 tablet 2   nitroGLYCERIN (NITROSTAT) 0.4 MG SL tablet Place 1 tablet (0.4 mg total) under the tongue every 5 (five) minutes x 3 doses as needed for chest pain. 25 tablet 12   omega-3 acid ethyl esters (LOVAZA) 1 g capsule Take 1 capsule (1 g total) by mouth 2 (two) times daily. 60 capsule 3   omeprazole (PRILOSEC) 20 MG capsule TAKE ONE CAPSULE BY MOUTH EVERY DAY 30 capsule 0   ondansetron (ZOFRAN-ODT) 4 MG disintegrating tablet Take 1 tablet (4 mg total) by mouth every 8 (eight) hours as needed for nausea or vomiting. 20 tablet 0   Rimegepant Sulfate (NURTEC) 75 MG TBDP Take 1 tablet (75 mg total) by mouth daily as needed. For migraines. Take as close to onset of migraine as possible. One daily maximum. 8 tablet 11   rosuvastatin (CRESTOR) 10 MG tablet TAKE ONE TABLET BY MOUTH DAILY 30 tablet 11   venlafaxine XR (EFFEXOR-XR) 150 MG 24 hr capsule Take 1 capsule (150 mg total) by mouth daily. 30 capsule 11   Vitamin D, Ergocalciferol, 50000 units CAPS TAKE ONE CAPSULE BY MOUTH EVERY 7 DAYS 5 capsule 0   aluminum-magnesium hydroxide-simethicone (MAALOX) 200-200-20 MG/5ML SUSP Take 30 mLs by mouth 4 (four) times daily -  before meals and at bedtime. (Patient not taking:  Reported on 10/25/2023) 355 mL 1   flecainide (TAMBOCOR) 50 MG tablet Take 1 tablet (50 mg total) by mouth 2 (two) times daily. (Patient not taking: Reported on 10/25/2023) 30 tablet 0   methocarbamol (ROBAXIN) 500 MG tablet Take 2 tablets (1,000 mg total) by mouth every 8 (eight) hours as needed for muscle spasms. (Patient not taking: Reported on 10/25/2023) 30 tablet 0   metoprolol tartrate (LOPRESSOR) 25 MG tablet Take 1 tablet (25 mg total) by mouth 2 (two) times daily. (Patient not taking: Reported on 10/25/2023) 60 tablet 0   No current facility-administered medications for this visit.    Allergies as of 10/25/2023 - Review Complete 10/25/2023  Allergen Reaction Noted   Aspirin Other (See Comments) 10/03/2011   Ibuprofen [ibuprofen] Nausea And Vomiting 08/02/2011   Penicillins Nausea And Vomiting 06/02/2011    Vitals: Today's Vitals   10/25/23 1529  BP: 102/77  Pulse: (!) 107  Weight: 180 lb 9.6 oz (81.9 kg)  Height: 5' (1.524 m)   Body mass index is 35.27 kg/m.   Physical exam: Exam: Gen: NAD, very pleasant middle-aged African-American female, conversant, well nourised, obese, well groomed                     CV: RRR, no MRG. No Carotid Bruits. No peripheral edema, warm, nontender  Neuro: Detailed Neurologic Exam  Speech:    Speech is normal; fluent and spontaneous with normal comprehension.  Cognition:    The patient is  oriented to person, place, and time;     recent and remote memory intact;     language fluent;     normal attention, concentration,     fund of knowledge Cranial Nerves:     Trigeminal sensation is intact and the muscles of mastication are normal. The face is symmetric. The palate elevates in the midline. Hearing intact. Voice is normal. Shoulder shrug is normal. The tongue has normal motion without fasciculations.   Coordination:    Normal finger to nose and heel to shin. Normal rapid alternating movements.   Gait:    Deferred, seated in  wheelchair  Motor Observation:    No asymmetry, no atrophy, and no involuntary movements noted. Tone:    Normal muscle tone.      Strength:    Strength is V/V in the upper and lower limbs.                 Assessment/Plan:  68 year old with chronic migraines followed by Dr. Lucia Gaskins.  Initially referred due to worsening migraine headaches, vision changes in right eye with swelling and positional headaches.  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    -Difficulty remembering monthly Emgality injections, discussed other options such as Qulipta or Vyepti, patient and son wish to continue with Emgality and will try to do better to remember monthly injection as migraines well-controlled with routine Emgality injections.  Discussed ways to help remember this.  They were advised to call office if they wish to change therapies in the future -Continue Nurtec as needed for emergent management. Refills provided. Triptans contraindicated due to history of stroke and CAD     She will follow-up in 6 months or call earlier if needed    No orders of the defined types were placed in this encounter.  Meds ordered this encounter  Medications   Galcanezumab-gnlm (EMGALITY) 120 MG/ML SOAJ    Sig: Inject 120 mg into the skin every 30 (thirty) days.    Dispense:  1 mL    Refill:  11    This prescription was filled on 12/06/2021. Any refills authorized will be placed on file.   Rimegepant Sulfate (NURTEC) 75 MG TBDP    Sig: Take 1 tablet (75 mg total) by mouth daily as needed. For migraines. Take as close to onset of migraine as possible. One daily maximum.    Dispense:  8 tablet    Refill:  11    8 or max allowed by insurance. Triptans contraindicated in this  patient due to stroke    CC: Ivonne Andrew, NP   I spent 20 minutes of face-to-face and non-face-to-face time with patient and son.  This included previsit chart review, lab review, study review, order entry, electronic health record documentation, patient education and discussion regarding above diagnoses and treatment plan and answered all the questions to patient and son satisfaction  Ihor Austin, AGNP-BC  [facility] [address] [place], [place] [address]  Phone [phone] Fax [phone] Note: This document was prepared with digital dictation and possible smart phrase technology. Any transcriptional errors that result from this process are unintentional.

## 2023-10-25 NOTE — Patient Instructions (Signed)
Continue Emgality - ensure you are using every 30 days for best benefit  Continue Nurtec as needed   Please let me know if migraines should start to worsen     Follow up in 6 months or call earlier if needed

## 2023-10-29 ENCOUNTER — Ambulatory Visit: Payer: Medicaid Other | Admitting: Nurse Practitioner

## 2023-11-05 ENCOUNTER — Other Ambulatory Visit: Payer: Self-pay | Admitting: Internal Medicine

## 2023-11-05 ENCOUNTER — Encounter: Payer: Self-pay | Admitting: Nurse Practitioner

## 2023-11-05 ENCOUNTER — Ambulatory Visit (INDEPENDENT_AMBULATORY_CARE_PROVIDER_SITE_OTHER): Payer: Medicare Other | Admitting: Nurse Practitioner

## 2023-11-05 VITALS — BP 105/61 | HR 86 | Ht 60.0 in | Wt 182.0 lb

## 2023-11-05 DIAGNOSIS — F4323 Adjustment disorder with mixed anxiety and depressed mood: Secondary | ICD-10-CM

## 2023-11-05 DIAGNOSIS — E039 Hypothyroidism, unspecified: Secondary | ICD-10-CM

## 2023-11-05 DIAGNOSIS — E781 Pure hyperglyceridemia: Secondary | ICD-10-CM

## 2023-11-05 DIAGNOSIS — Z13220 Encounter for screening for lipoid disorders: Secondary | ICD-10-CM | POA: Diagnosis not present

## 2023-11-05 MED ORDER — OMEPRAZOLE 20 MG PO CPDR: 20.0000 mg | DELAYED_RELEASE_CAPSULE | Freq: Every day | ORAL | 0 refills | Status: DC

## 2023-11-05 MED ORDER — VENLAFAXINE HCL ER 150 MG PO CP24: 150.0000 mg | ORAL_CAPSULE | Freq: Every day | ORAL | 11 refills | Status: DC

## 2023-11-05 MED ORDER — LEVOTHYROXINE SODIUM 75 MCG PO TABS: 75.0000 ug | ORAL_TABLET | Freq: Every day | ORAL | 2 refills | Status: DC

## 2023-11-05 MED ORDER — ALUMINUM-MAGNESIUM-SIMETHICONE 200-200-20 MG/5ML PO SUSP: 30.0000 mL | Freq: Three times a day (TID) | ORAL | 1 refills | Status: DC

## 2023-11-05 MED ORDER — FENOFIBRATE 48 MG PO TABS: 48.0000 mg | ORAL_TABLET | Freq: Every day | ORAL | 0 refills | Status: DC

## 2023-11-05 NOTE — Progress Notes (Signed)
 Subjective   Patient ID: [name], female    DOB: 07-31-1956, 68 y.o.   MRN: [id number]  Chief Complaint  Patient presents with   Medical Management of Chronic Issues   Hand Pain    Right side    Referring provider: Ivonne Andrew, NP  [name] is a 68 y.o. female with Past Medical History: No date: 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HCC) No date: Allergy No date: Amblyopia No date: Anemia No date: Arthritis     Comment:  "in my lower back" No date: Asthma No date: Atrial myxoma 1978: Blindness of left eye No date: Blood transfusion No date: Breast cyst No date: Constipation No date: 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HCC) No date: CVA (cerebral infarction) No date: Depression No date: Depression No date: GERD (gastroesophageal reflux disease) No date: 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HCC) No date: Hemorrhoids 1977: History of open heart surgery No date: Hyperlipemia No date: Hypothyroidism     Comment:  per office note dated 03/25/2014 No date: Hypoxia No date: Migraine No date: Peptic ulcer associated with Helicobacter pylori infection No date: Reflux 1978: Stroke [facility])     Comment:  "piece tumor broke off & went to left eye; leaving me               blind"   HPI  Patient presents today for follow-up on hypothyroidism and hypertriglyceridemia.  We will check labs today.  Patient does need refills on medications today.  We discussed that patient does need a follow-up appointment with cardiology.  She states that she will make this up she does have a history of frequent PVCs. Denies f/c/s, n/v/d, hemoptysis, PND, leg swelling Denies chest pain or edema   Note patient is complaining of right hand pain.  She states that this happened a couple weeks ago but has resolved at this time.    Allergies  Allergen Reactions   Aspirin Other (See Comments)    Can take baby aspirin (heart flutters with high doses)   Ibuprofen [Ibuprofen] Nausea And Vomiting   Penicillins Nausea  And Vomiting    Has patient had a PCN reaction causing immediate rash, facial/tongue/throat swelling, SOB or lightheadedness with hypotension: Yes Has patient had a PCN reaction causing severe rash involving mucus membranes or skin necrosis: No Has patient had a PCN reaction that required hospitalization No Has patient had a PCN reaction occurring within the last 10 years: No If all of the above answers are "NO", then may proceed with Cephalosporin use.     Immunization History  Administered Date(s) Administered   Influenza Split 08/03/2011, 07/18/2015   Influenza,inj,Quad PF,6+ Mos 07/02/2016, 09/27/2018, 05/30/2021, 07/13/2022   Tdap 09/27/2018    Tobacco History: Social History   Tobacco Use  Smoking Status Never  Smokeless Tobacco Never   Counseling given: Not Answered   Outpatient Encounter Medications as of 11/05/2023  Medication Sig   aspirin EC 81 MG tablet Take 81 mg by mouth daily. Swallow whole.   Galcanezumab-gnlm (EMGALITY) 120 MG/ML SOAJ Inject 120 mg into the skin every 30 (thirty) days.   hydrocortisone (CORTEF) 10 MG tablet take 15 MG (1&1/2 tablets) with Breakfast AND 10 MG (ONE tablet) between 2-4 as needed daily]   hydrOXYzine (ATARAX) 10 MG tablet TAKE ONE TABLET BY MOUTH AT BEDTIME AS NEEDED   nitroGLYCERIN (NITROSTAT) 0.4 MG SL tablet Place 1 tablet (0.4 mg total) under the tongue every 5 (five) minutes x 3 doses as needed for chest pain.   omega-3 acid ethyl esters (  LOVAZA) 1 g capsule Take 1 capsule (1 g total) by mouth 2 (two) times daily.   ondansetron (ZOFRAN-ODT) 4 MG disintegrating tablet Take 1 tablet (4 mg total) by mouth every 8 (eight) hours as needed for nausea or vomiting.   Rimegepant Sulfate (NURTEC) 75 MG TBDP Take 1 tablet (75 mg total) by mouth daily as needed. For migraines. Take as close to onset of migraine as possible. One daily maximum.   rosuvastatin (CRESTOR) 10 MG tablet TAKE ONE TABLET BY MOUTH DAILY   Vitamin D, Ergocalciferol,  50000 units CAPS TAKE ONE CAPSULE BY MOUTH EVERY 7 DAYS   [DISCONTINUED] aluminum-magnesium hydroxide-simethicone (MAALOX) 200-200-20 MG/5ML SUSP Take 30 mLs by mouth 4 (four) times daily -  before meals and at bedtime.   [DISCONTINUED] levothyroxine (SYNTHROID) 75 MCG tablet TAKE ONE TABLET ( ) BY MOUTH EVERY DAY   [DISCONTINUED] omeprazole (PRILOSEC) 20 MG capsule Take 1 capsule (20 mg total) by mouth daily.   [DISCONTINUED] venlafaxine XR (EFFEXOR-XR) 150 MG 24 hr capsule Take 1 capsule (150 mg total) by mouth daily.   aluminum-magnesium hydroxide-simethicone (MAALOX) 200-200-20 MG/5ML SUSP Take 30 mLs by mouth 4 (four) times daily -  before meals and at bedtime.   fenofibrate (TRICOR) 48 MG tablet Take 1 tablet (48 mg total) by mouth daily.   levothyroxine (SYNTHROID) 75 MCG tablet Take 1 tablet (75 mcg total) by mouth daily before breakfast.   omeprazole (PRILOSEC) 20 MG capsule Take 1 capsule (20 mg total) by mouth daily.   venlafaxine XR (EFFEXOR-XR) 150 MG 24 hr capsule Take 1 capsule (150 mg total) by mouth daily.   [DISCONTINUED] fenofibrate (TRICOR) 48 MG tablet TAKE ONE TABLET BY MOUTH EVERY DAY (Patient not taking: Reported on 11/05/2023)   [DISCONTINUED] flecainide (TAMBOCOR) 50 MG tablet Take 1 tablet (50 mg total) by mouth 2 (two) times daily. (Patient not taking: Reported on 10/25/2023)   [DISCONTINUED] HYDROcodone-acetaminophen (NORCO/VICODIN) 5-325 MG tablet Take 1 tablet by mouth every 6 (six) hours as needed for severe pain.   [DISCONTINUED] methocarbamol (ROBAXIN) 500 MG tablet Take 2 tablets (1,000 mg total) by mouth every 8 (eight) hours as needed for muscle spasms. (Patient not taking: Reported on 10/25/2023)   [DISCONTINUED] metoprolol tartrate (LOPRESSOR) 25 MG tablet Take 1 tablet (25 mg total) by mouth 2 (two) times daily. (Patient not taking: Reported on 10/25/2023)   No facility-administered encounter medications on file as of 11/05/2023.    Review of  Systems  Review of Systems  Constitutional: Negative.   HENT: Negative.    Cardiovascular: Negative.   Gastrointestinal: Negative.   Allergic/Immunologic: Negative.   Neurological: Negative.   Psychiatric/Behavioral: Negative.       Objective:   BP 105/61   Pulse 86   Ht 5' (1.524 m)   Wt 182 lb (82.6 kg)   BMI 35.54 kg/m   Wt Readings from Last 5 Encounters:  11/05/23 182 lb (82.6 kg)  10/25/23 180 lb 9.6 oz (81.9 kg)  05/15/23 204 lb (92.5 kg)  04/02/23 198 lb 3.2 oz (89.9 kg)  03/07/23 204 lb (92.5 kg)     Physical Exam Vitals and nursing note reviewed.  Constitutional:      General: She is not in acute distress.    Appearance: She is well-developed.  Cardiovascular:     Rate and Rhythm: Normal rate and regular rhythm.  Pulmonary:     Effort: Pulmonary effort is normal.     Breath sounds: Normal breath sounds.  Neurological:  Mental Status: She is alert and oriented to person, place, and time.       Assessment & Plan:   Lipid screening -     Lipid panel  Adjustment disorder with mixed anxiety and depressed mood -     Venlafaxine HCl ER; Take 1 capsule (150 mg total) by mouth daily.  Dispense: 30 capsule; Refill: 11  Hypertriglyceridemia -     Fenofibrate; Take 1 tablet (48 mg total) by mouth daily.  Dispense: 15 tablet; Refill: 0 -     CBC -     Comprehensive metabolic panel -     Lipid panel  Hypothyroidism, unspecified type -     Thyroid Panel With TSH  Other orders -     Aluminum-Magnesium-Simethicone; Take 30 mLs by mouth 4 (four) times daily -  before meals and at bedtime.  Dispense: 355 mL; Refill: 1 -     Omeprazole; Take 1 capsule (20 mg total) by mouth daily.  Dispense: 30 capsule; Refill: 0 -     Levothyroxine Sodium; Take 1 tablet (75 mcg total) by mouth daily before breakfast.  Dispense: 30 tablet; Refill: 2     Return in about 6 months (around 05/04/2024).   Ivonne Andrew, NP 11/05/2023

## 2023-11-05 NOTE — Patient Instructions (Signed)
 1. Adjustment disorder with mixed anxiety and depressed mood  - venlafaxine XR (EFFEXOR-XR) 150 MG 24 hr capsule; Take 1 capsule (150 mg total) by mouth daily.  Dispense: 30 capsule; Refill: 11  2. Hypertriglyceridemia  - fenofibrate (TRICOR) 48 MG tablet; Take 1 tablet (48 mg total) by mouth daily.  Dispense: 15 tablet; Refill: 0 - CBC - Comprehensive metabolic panel - Lipid Panel  3. Lipid screening (Primary)  - Lipid Panel  4. Hypothyroidism, unspecified type  - Thyroid Panel With TSH

## 2023-11-06 LAB — CBC
Hematocrit: 41.8 % (ref 34.0–46.6)
Hemoglobin: 13.6 g/dL (ref 11.1–15.9)
MCH: 29.8 pg (ref 26.6–33.0)
MCHC: 32.5 g/dL (ref 31.5–35.7)
MCV: 92 fL (ref 79–97)
Platelets: 329 10*3/uL (ref 150–450)
RBC: 4.57 x10E6/uL (ref 3.77–5.28)
RDW: 12.6 % (ref 11.7–15.4)
WBC: 7 10*3/uL (ref 3.4–10.8)

## 2023-11-06 LAB — LIPID PANEL
Chol/HDL Ratio: 2.4 {ratio} (ref 0.0–4.4)
Cholesterol, Total: 101 mg/dL (ref 100–199)
HDL: 42 mg/dL (ref >39)
LDL Chol Calc (NIH): 39 mg/dL (ref 0–99)
Triglycerides: 111 mg/dL (ref 0–149)
VLDL Cholesterol Cal: 20 mg/dL (ref 5–40)

## 2023-11-06 LAB — COMPREHENSIVE METABOLIC PANEL
ALT: 13 [IU]/L (ref 0–32)
AST: 21 [IU]/L (ref 0–40)
Albumin: 3.9 g/dL (ref 3.9–4.9)
Alkaline Phosphatase: 64 [IU]/L (ref 44–121)
BUN/Creatinine Ratio: 14 (ref 12–28)
BUN: 12 mg/dL (ref 8–27)
Bilirubin Total: 0.2 mg/dL (ref 0.0–1.2)
CO2: 21 mmol/L (ref 20–29)
Calcium: 9.7 mg/dL (ref 8.7–10.3)
Chloride: 101 mmol/L (ref 96–106)
Creatinine, Ser: 0.86 mg/dL (ref 0.57–1.00)
Globulin, Total: 3.1 g/dL (ref 1.5–4.5)
Glucose: 201 mg/dL — ABNORMAL HIGH (ref 70–99)
Potassium: 5.2 mmol/L (ref 3.5–5.2)
Sodium: 137 mmol/L (ref 134–144)
Total Protein: 7 g/dL (ref 6.0–8.5)
eGFR: 75 mL/min/{1.73_m2} (ref >59)

## 2023-11-06 LAB — THYROID PANEL WITH TSH
Free Thyroxine Index: 1.9 (ref 1.2–4.9)
T3 Uptake Ratio: 24 % (ref 24–39)
T4, Total: 8 ug/dL (ref 4.5–12.0)
TSH: 0.664 u[IU]/mL (ref 0.450–4.500)

## 2023-11-07 ENCOUNTER — Other Ambulatory Visit (HOSPITAL_COMMUNITY): Payer: Self-pay

## 2023-11-07 ENCOUNTER — Telehealth: Payer: Self-pay

## 2023-11-07 NOTE — Telephone Encounter (Signed)
*  [facility]  Pharmacy Patient Advocate Encounter   Received notification from CoverMyMeds that prior authorization for Emgality  is required/requested.   Insurance verification completed.   The patient is insured through [facility] MEDICAID .   Per test claim: PA required; PA submitted to above mentioned insurance via [facility] [employer] Key/confirmation #/EOC [id number] W Status is pending

## 2023-11-09 NOTE — Telephone Encounter (Signed)
 Pharmacy Patient Advocate Encounter  Received notification from [facility] MEDICAID that Prior Authorization for Emgality has been APPROVED from 11/07/2023 to 11/06/2024

## 2023-11-11 NOTE — Progress Notes (Deleted)
 Cardiology Office Note    Date:  11/11/2023  ID:  Jermya, Dowding Dec 10, 1955, MRN [id number] PCP:  Ivonne Andrew, NP  Cardiologist:  None  Electrophysiologist:  Lewayne Bunting, MD   Chief Complaint: ***  History of Present Illness: [name] is a 68 y.o. 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  Previously seen by Dr. Jacques Navy in 12/2020 for atypical chest pain.  Patient underwent coronary CTA in 02/2021 that showed no evidence of CAD with coronary calcium score of 0.  Echocardiogram in 01/2021 indicated LVEF of 55 to 60%, LV with normal function, no RWMA, diastolic parameters were normal, mild aortic valve sclerosis was present without evidence of stenosis.  Cardiac monitor in 02/2021 indicated very frequent ventricular ectopy at 15% burden she was referred to electrophysiology and started on flecainide.  She was last seen by Dr. Ladona Ridgel in 07/2021, she remained stable from a cardiac standpoint.  Today she presents for follow-up.  She reports that she  Frequent PVCs: Previous cardiac monitor in 2022 indicated frequent PVCs at 15% burden.  She was referred to Dr. Ladona Ridgel and was started on flecainide. Today she reports EKG today History of precordial pain: Coronary CTA in 2022 indicated coronary calcium score of 0.  Today she reports History of atrial myxoma: Echo in 2022 showed no evidence of recurrence, she denies any strokelike symptoms. Hypertension: Blood pressure today Hyperlipidemia: Last lipid profile in 11/01/2023 indicated total cholesterol 101, HDL 42, triglycerides 111 and LDL 39.  Labwork independently reviewed: 11/05/2023: Sodium 137, potassium 5.2, creatinine 0.86, AST 21, LT 13  ROS: .   *** denies chest pain, shortness of breath, lower extremity edema, fatigue,  palpitations, melena, hematuria, hemoptysis, diaphoresis, weakness, presyncope, syncope, orthopnea, and PND.  All other systems are reviewed and otherwise negative.  Studies Reviewed: Marland Kitchen    EKG:  EKG is ordered today, personally reviewed, demonstrating ***  CV Studies:  Cardiac Studies & Procedures   ______________________________________________________________________________________________   STRESS TESTS  NM MYOCAR MULTI W/SPECT W 11/23/2017  Narrative CLINICAL DATA:  Chest pain  EXAM: MYOCARDIAL IMAGING WITH SPECT (REST AND PHARMACOLOGIC-STRESS)  GATED LEFT VENTRICULAR WALL MOTION STUDY  LEFT VENTRICULAR EJECTION FRACTION  TECHNIQUE: Standard myocardial SPECT imaging was performed after resting intravenous injection of 10 mCi Tc-66m tetrofosmin. Subsequently, intravenous infusion of Lexiscan was performed under the supervision of the Cardiology staff. At peak effect of the drug, 30 mCi Tc-5m tetrofosmin was injected intravenously and standard myocardial SPECT imaging was performed. Quantitative gated imaging was also performed to evaluate left ventricular wall motion, and estimate left ventricular ejection fraction.  COMPARISON:  10/05/2011  FINDINGS: Perfusion: No decreased activity in the left ventricle on stress imaging to suggest reversible ischemia or infarction.  Wall Motion: Normal left ventricular wall motion. No left ventricular dilation.  Left Ventricular Ejection Fraction: 70 %  End diastolic volume 56 ml  End systolic volume 17 ml  IMPRESSION: 1. No reversible ischemia or infarction.  2. Normal left ventricular wall motion.  3. Left ventricular ejection fraction 70%  4. Non invasive risk stratification*: Low  *2012 Appropriate Use Criteria for Coronary Revascularization Focused Update: J Am Coll Cardiol. 2012;59(9):857-881. http://content.dementiazones.com.aspx?articleid=1201161   Electronically Signed By: Charline Bills  M.D. On: 11/23/2017 13:47   ECHOCARDIOGRAM  ECHOCARDIOGRAM COMPLETE 02/04/2021  Narrative ECHOCARDIOGRAM REPORT    Patient  Name:   [name] Date of Exam: 02/04/2021 Medical Rec #:  [id number]          Height:       60.0 in Accession #:    [id number]         Weight:       210.0 lb Date of Birth:  1956/02/20          BSA:          1.906 m Patient Age:    64 years           BP:           118/80 mmHg Patient Gender: F                  HR:           77 bpm. Exam Location:  [address]  Procedure: 2D Echo, Cardiac Doppler, Color Doppler and Intracardiac Opacification Agent  Indications:    R00.2 Palpitations  History:        Patient has prior history of Echocardiogram examinations, most recent 09/12/2006. Stroke, Palpitations, Signs/Symptoms:H/o atrial myxoma; Risk Factors:Hypertension and Obesity.  Sonographer:    Samule Ohm RDCS Referring Phys: [id number] Parke Poisson   Sonographer Comments: Technically difficult study due to poor echo windows and patient is morbidly obese. Image acquisition challenging due to patient body habitus. IMPRESSIONS   1. Left ventricular ejection fraction, by estimation, is 55 to 60%. The left ventricle has normal function. The left ventricle has no regional wall motion abnormalities. Left ventricular diastolic parameters were normal. 2. Right ventricular systolic function is normal. The right ventricular size is normal. 3. The mitral valve is normal in structure. Trivial mitral valve regurgitation. No evidence of mitral stenosis. 4. The aortic valve is tricuspid. Aortic valve regurgitation is not visualized. Mild aortic valve sclerosis is present, with no evidence of aortic valve stenosis. 5. The inferior vena cava is normal in size with greater than 50% respiratory variability, suggesting right atrial pressure of 3 mmHg.  FINDINGS Left Ventricle: Left ventricular ejection fraction, by estimation, is 55 to 60%. The left ventricle has  normal function. The left ventricle has no regional wall motion abnormalities. Definity contrast agent was given IV to delineate the left ventricular endocardial borders. The left ventricular internal cavity size was normal in size. There is no left ventricular hypertrophy. Left ventricular diastolic parameters were normal.  Right Ventricle: The right ventricular size is normal. Right ventricular systolic function is normal.  Left Atrium: Left atrial size was normal in size.  Right Atrium: Right atrial size was normal in size.  Pericardium: There is no evidence of pericardial effusion.  Mitral Valve: The mitral valve is normal in structure. Trivial mitral valve regurgitation. No evidence of mitral valve stenosis.  Tricuspid Valve: The tricuspid valve is normal in structure. Tricuspid valve regurgitation is mild . No evidence of tricuspid stenosis.  Aortic Valve: The aortic valve is tricuspid. Aortic valve regurgitation is not visualized. Mild aortic valve sclerosis is present, with no evidence of aortic valve stenosis.  Pulmonic Valve: The pulmonic valve was normal in structure. Pulmonic valve regurgitation is not visualized. No evidence of pulmonic stenosis.  Aorta: The aortic root is normal in size and structure.  Venous: The inferior vena cava is normal in size with greater than 50% respiratory variability, suggesting right atrial pressure of 3 mmHg.  LEFT VENTRICLE PLAX 2D LVIDd:         4.20 cm  Diastology LVIDs:  3.00 cm  LV e' medial:    8.05 cm/s LV PW:         1.10 cm  LV E/e' medial:  8.9 LV IVS:        1.10 cm  LV e' lateral:   7.18 cm/s LVOT diam:     2.00 cm  LV E/e' lateral: 10.0 LV SV:         45 LV SV Index:   24 LVOT Area:     3.14 cm   RIGHT VENTRICLE            IVC RVSP:           22.9 mmHg  IVC diam: 0.80 cm  LEFT ATRIUM             Index       RIGHT ATRIUM LA diam:        3.40 cm 1.78 cm/m  RA Pressure: 3.00 mmHg LA Vol (A2C):   36.7 ml 19.26  ml/m LA Vol (A4C):   33.9 ml 17.79 ml/m LA Biplane Vol: 35.7 ml 18.73 ml/m AORTIC VALVE LVOT Vmax:   74.40 cm/s LVOT Vmean:  52.700 cm/s LVOT VTI:    0.143 m  AORTA Ao Root diam: 3.00 cm Ao Asc diam:  2.50 cm  MV E velocity: 71.60 cm/s  TRICUSPID VALVE MV A velocity: 44.10 cm/s  TR Peak grad:   19.9 mmHg MV E/A ratio:  1.62        TR Vmax:        223.00 cm/s Estimated RAP:  3.00 mmHg RVSP:           22.9 mmHg  SHUNTS Systemic VTI:  0.14 m Systemic Diam: 2.00 cm  Olga Millers MD Electronically signed by Olga Millers MD Signature Date/Time: 02/04/2021/12:49:02 PM    Final    MONITORS  CARDIAC EVENT MONITOR 02/11/2021  Narrative Indication: palpitations  Minimum HR (bpm): 59 Maximum HR (bpm): 162  Supraventricular Ectopy: <1% SVT: none reported  Ventricular Ectopy: very frequent, 15% total beats NSVT: very frequent, up to 8 beats Ventricular Tachycardia: no sustained episodes  Pauses: none AV block: none  Atrial fibrillation: none  Diary events: symptoms of dizziness, chest pain, lightheaded, rapid heart beat associated with PVCs and NSVT  Impression Very frequent PVCs and NSVT, occasionally associated with symptoms.   CT SCANS  CT CORONARY MORPH W/CTA COR W/SCORE 03/03/2021  Addendum 03/03/2021  4:36 PM ADDENDUM REPORT: 03/03/2021 16:33  CLINICAL DATA:  89F presents with chest pain  EXAM: Cardiac/Coronary CTA  TECHNIQUE: The patient was scanned on a [employer].  FINDINGS: A 100 kV retrospective scan was triggered in the descending thoracic aorta at 111 HU's. Axial non-contrast 3 mm slices were carried out through the heart. The data set was analyzed on a dedicated work station and scored using the Agatson method. Gantry rotation speed was 250 msecs and collimation was .6 mm. 0.8 mg of sl NTG was given. The 3D data set was reconstructed in 5% intervals of the 35-75 % of the R-R cycle. Phases were analyzed on a dedicated work  station using MPR, MIP and VRT modes. The patient received 80 cc of contrast. Retrospective scan was done due to frequent PVCs  Coronary Arteries:  Normal coronary origin.  Right dominance.  RCA is a large dominant artery that gives rise to PDA and PLA. There is no plaque. Retained epicardial pacing wires obscures distal RCA/PDA and part of distal LAD  Left main is a  large artery that gives rise to LAD and LCX arteries.  LAD is a large vessel that has no plaque.  LCX is a non-dominant artery that gives rise to one large OM1 branch. There is no plaque.  Other findings:  Left Ventricle: Normal size  Left Atrium: Mild enlargement  Pulmonary Veins: Normal configuration  Right Ventricle: Normal size  Right Atrium: Normal size  Cardiac valves: No calcifications  Thoracic aorta: Normal size  Pulmonary Arteries: Normal size  Systemic Veins: Normal drainage  Pericardium: Normal thickness  IMPRESSION: 1. Coronary calcium score of 0.  2. No evidence of CAD in visualized coronaries. Retained epicardial pacing wires obscures distal RCA/PDA and part of distal LAD  3. No evidence of atrial myxoma recurrence  CAD-RADS 0. No evidence of CAD (0%). Consider non-atherosclerotic causes of chest pain.   Electronically Signed By: Epifanio Lesches MD On: 03/03/2021 16:33  Narrative EXAM: OVER-READ INTERPRETATION  CT CHEST  The following report is an over-read performed by radiologist Dr. Trudie Reed of [facility] on 03/03/2021. This over-read does not include interpretation of cardiac or coronary anatomy or pathology. The coronary calcium score/coronary CTA interpretation by the cardiologist is attached.  COMPARISON:  None.  FINDINGS: Within the visualized portions of the thorax there are no suspicious appearing pulmonary nodules or masses, there is no acute consolidative airspace disease, no pleural effusions, no pneumothorax and no  lymphadenopathy. Visualized portions of the upper abdomen demonstrate a 1 cm low-attenuation lesion in segment 8 of the liver, compatible with a simple cyst. There is also an incompletely imaged exophytic lesion in the upper pole the left kidney which is relatively high attenuation (87 HU), but stable compared to remote prior study from 06/02/2011, likely to represent a proteinaceous/hemorrhagic cyst. There are no aggressive appearing lytic or blastic lesions noted in the visualized portions of the skeleton.  IMPRESSION: 1. No significant incidental noncardiac findings are noted.  Electronically Signed: By: Trudie Reed M.D. On: 03/03/2021 15:39     ______________________________________________________________________________________________        Current Reported Medications:.    No outpatient medications have been marked as taking for the 11/13/23 encounter (Appointment) with Rip Harbour, NP.    Physical Exam:    VS:  There were no vitals taken for this visit.   Wt Readings from Last 3 Encounters:  11/05/23 182 lb (82.6 kg)  10/25/23 180 lb 9.6 oz (81.9 kg)  05/15/23 204 lb (92.5 kg)    GEN: Well nourished, well developed in no acute distress NECK: No JVD; No carotid bruits CARDIAC: ***RRR, no murmurs, rubs, gallops RESPIRATORY:  Clear to auscultation without rales, wheezing or rhonchi  ABDOMEN: Soft, non-tender, non-distended EXTREMITIES:  No edema; No acute deformity   Asessement and Plan:.     ***     Disposition: F/u with ***  Signed, Rip Harbour, NP

## 2023-11-13 ENCOUNTER — Ambulatory Visit: Payer: Medicare Other | Attending: Cardiology | Admitting: Cardiology

## 2023-11-13 DIAGNOSIS — I4729 Other ventricular tachycardia: Secondary | ICD-10-CM

## 2023-11-13 DIAGNOSIS — I493 Ventricular premature depolarization: Secondary | ICD-10-CM

## 2023-11-16 ENCOUNTER — Other Ambulatory Visit (HOSPITAL_COMMUNITY): Payer: Self-pay

## 2023-11-16 ENCOUNTER — Telehealth: Payer: Self-pay | Admitting: Pharmacy Technician

## 2023-11-16 NOTE — Telephone Encounter (Signed)
 Pharmacy Patient Advocate Encounter  Received notification from [facility] MEDICAID that Prior Authorization for Nurtec 75 mg has been APPROVED from 11/16/2023 to 11/15/2024   PA #/Case ID/Reference #: [id number]

## 2023-11-16 NOTE — Telephone Encounter (Signed)
 Pharmacy Patient Advocate Encounter   Received notification from CoverMyMeds that prior authorization for Nurtec 75 mg is required/requested.   Insurance verification completed.   The patient is insured through [facility] MEDICAID .   Per test claim: PA required; PA submitted to above mentioned insurance via [employer] Key/confirmation #/EOC [id number] W Status is pending

## 2023-11-26 ENCOUNTER — Other Ambulatory Visit: Payer: Self-pay

## 2023-11-26 DIAGNOSIS — E781 Pure hyperglyceridemia: Secondary | ICD-10-CM

## 2023-11-26 MED ORDER — FENOFIBRATE 48 MG PO TABS: 48.0000 mg | ORAL_TABLET | Freq: Every day | ORAL | 0 refills | Status: DC

## 2023-12-05 ENCOUNTER — Other Ambulatory Visit: Payer: Self-pay | Admitting: Nurse Practitioner

## 2023-12-05 DIAGNOSIS — E781 Pure hyperglyceridemia: Secondary | ICD-10-CM

## 2023-12-06 ENCOUNTER — Ambulatory Visit: Payer: Self-pay | Admitting: Nurse Practitioner

## 2023-12-10 ENCOUNTER — Other Ambulatory Visit: Payer: Self-pay | Admitting: Nurse Practitioner

## 2023-12-10 DIAGNOSIS — G479 Sleep disorder, unspecified: Secondary | ICD-10-CM

## 2023-12-10 NOTE — Telephone Encounter (Signed)
 Please advise [facility]

## 2023-12-21 ENCOUNTER — Ambulatory Visit: Payer: Self-pay | Admitting: Nurse Practitioner

## 2023-12-26 ENCOUNTER — Ambulatory Visit: Payer: Medicare Other | Admitting: Internal Medicine

## 2023-12-31 ENCOUNTER — Other Ambulatory Visit: Payer: Self-pay

## 2023-12-31 DIAGNOSIS — E781 Pure hyperglyceridemia: Secondary | ICD-10-CM

## 2023-12-31 MED ORDER — FENOFIBRATE 48 MG PO TABS: 48.0000 mg | ORAL_TABLET | Freq: Every day | ORAL | 0 refills | Status: DC

## 2024-01-02 ENCOUNTER — Other Ambulatory Visit: Payer: Self-pay | Admitting: Nurse Practitioner

## 2024-01-08 ENCOUNTER — Other Ambulatory Visit: Payer: Self-pay | Admitting: Nurse Practitioner

## 2024-01-08 DIAGNOSIS — G479 Sleep disorder, unspecified: Secondary | ICD-10-CM

## 2024-01-22 ENCOUNTER — Other Ambulatory Visit: Payer: Self-pay | Admitting: Nurse Practitioner

## 2024-01-22 DIAGNOSIS — G479 Sleep disorder, unspecified: Secondary | ICD-10-CM

## 2024-01-24 ENCOUNTER — Telehealth: Payer: Self-pay | Admitting: Nurse Practitioner

## 2024-01-24 ENCOUNTER — Ambulatory Visit: Payer: Self-pay | Admitting: Nurse Practitioner

## 2024-01-24 NOTE — Telephone Encounter (Signed)
 Copied from CRM [id number]. Topic: General - Other >> Jan 23, 2024  2:27 PM Everette C wrote: Reason for CRM: The patient's son has called to request a letter excusing them from jury duty   Please contact the patient's family member if/when possible to discuss their needed document from their PCP

## 2024-01-25 ENCOUNTER — Ambulatory Visit: Payer: Self-pay | Admitting: Nurse Practitioner

## 2024-01-28 ENCOUNTER — Ambulatory Visit: Payer: Self-pay | Admitting: Nurse Practitioner

## 2024-01-31 NOTE — Telephone Encounter (Signed)
 Done [facility]

## 2024-02-06 ENCOUNTER — Ambulatory Visit (INDEPENDENT_AMBULATORY_CARE_PROVIDER_SITE_OTHER): Admitting: Nurse Practitioner

## 2024-02-06 ENCOUNTER — Encounter: Payer: Self-pay | Admitting: Nurse Practitioner

## 2024-02-06 VITALS — BP 98/70 | HR 96 | Temp 98.6°F | Wt 180.2 lb

## 2024-02-06 DIAGNOSIS — F4323 Adjustment disorder with mixed anxiety and depressed mood: Secondary | ICD-10-CM | POA: Diagnosis not present

## 2024-02-06 DIAGNOSIS — F419 Anxiety disorder, unspecified: Secondary | ICD-10-CM | POA: Diagnosis not present

## 2024-02-06 DIAGNOSIS — E039 Hypothyroidism, unspecified: Secondary | ICD-10-CM | POA: Diagnosis not present

## 2024-02-06 DIAGNOSIS — F32A Depression, unspecified: Secondary | ICD-10-CM | POA: Diagnosis not present

## 2024-02-06 MED ORDER — LEVOTHYROXINE SODIUM 75 MCG PO TABS: 75.0000 ug | ORAL_TABLET | Freq: Every day | ORAL | 2 refills | Status: DC

## 2024-02-06 MED ORDER — ROSUVASTATIN CALCIUM 10 MG PO TABS: 10.0000 mg | ORAL_TABLET | Freq: Every day | ORAL | 11 refills | Status: AC

## 2024-02-06 MED ORDER — VENLAFAXINE HCL ER 150 MG PO CP24: 150.0000 mg | ORAL_CAPSULE | Freq: Every day | ORAL | 11 refills | Status: AC

## 2024-02-06 MED ORDER — OMEPRAZOLE 20 MG PO CPDR: 20.0000 mg | DELAYED_RELEASE_CAPSULE | Freq: Every day | ORAL | 0 refills | Status: DC

## 2024-02-06 NOTE — Progress Notes (Signed)
 Subjective   Patient ID: [name], female    DOB: 1956-02-19, 68 y.o.   MRN: [id number]  No chief complaint on file.   Referring provider: Jerrlyn Morel, NP  [name] is a 68 y.o. female with Past Medical History: No date: 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HCC) No date: Allergy No date: Amblyopia No date: Anemia No date: Arthritis     Comment:  "in my lower back" No date: Asthma No date: Atrial myxoma 1978: Blindness of left eye No date: Blood transfusion No date: Breast cyst No date: Constipation No date: 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HCC) No date: CVA (cerebral infarction) No date: Depression No date: Depression No date: GERD (gastroesophageal reflux disease) No date: 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HCC) No date: Hemorrhoids 1977: History of open heart surgery No date: Hyperlipemia No date: Hypothyroidism     Comment:  per office note dated 03/25/2014 No date: Hypoxia No date: Migraine No date: Peptic ulcer associated with Helicobacter pylori infection No date: Reflux 1978: Stroke [name])     Comment:  "piece tumor broke off & went to left eye; leaving me               blind"   HPI  Patient presents today for follow-up on hypothyroidism and hypertriglyceridemia.  We will check labs today.  Patient does need refills on medications today.  We discussed that patient does need a follow-up appointment with cardiology.  She states that she will make this up she does have a history of frequent PVCs. Denies f/c/s, n/v/d, hemoptysis, PND, leg swelling Denies chest pain or edema   Allergies  Allergen Reactions   Aspirin  Other (See Comments)    Can take baby aspirin  (heart flutters with high doses)   Ibuprofen [Ibuprofen] Nausea And Vomiting   Penicillins Nausea And Vomiting    Has patient had a PCN reaction causing immediate rash, facial/tongue/throat swelling, SOB or lightheadedness with hypotension: Yes Has patient had a PCN reaction causing severe rash involving mucus  membranes or skin necrosis: No Has patient had a PCN reaction that required hospitalization No Has patient had a PCN reaction occurring within the last 10 years: No If all of the above answers are "NO", then may proceed with Cephalosporin use.     Immunization History  Administered Date(s) Administered   Influenza Split 08/03/2011, 07/18/2015   Influenza,inj,Quad PF,6+ Mos 07/02/2016, 09/27/2018, 05/30/2021, 07/13/2022   Tdap 09/27/2018    Tobacco History: Social History   Tobacco Use  Smoking Status Never  Smokeless Tobacco Never   Counseling given: Not Answered   Outpatient Encounter Medications as of 02/06/2024  Medication Sig   aluminum -magnesium  hydroxide-simethicone  (MAALOX) 200-200-20 MG/5ML SUSP Take 30 mLs by mouth 4 (four) times daily -  before meals and at bedtime.   aspirin  EC 81 MG tablet Take 81 mg by mouth daily. Swallow whole.   fenofibrate  (TRICOR ) 48 MG tablet Take 1 tablet (48 mg total) by mouth daily.   Galcanezumab -gnlm (EMGALITY ) 120 MG/ML SOAJ Inject 120 mg into the skin every 30 (thirty) days.   hydrocortisone  (CORTEF ) 10 MG tablet take 15 MG (1&1/2 tablets) with Breakfast AND 10 MG (ONE tablet) between 2-4 as needed daily]   hydrOXYzine  (ATARAX ) 10 MG tablet TAKE ONE TABLET BY MOUTH AT BEDTIME AS NEEDED   nitroGLYCERIN  (NITROSTAT ) 0.4 MG SL tablet Place 1 tablet (0.4 mg total) under the tongue every 5 (five) minutes x 3 doses as needed for chest pain.   omega-3 acid ethyl esters (LOVAZA ) 1 g capsule TAKE  ONE CAPSULE BY MOUTH TWICE DAILY   ondansetron  (ZOFRAN -ODT) 4 MG disintegrating tablet Take 1 tablet (4 mg total) by mouth every 8 (eight) hours as needed for nausea or vomiting.   Rimegepant Sulfate (NURTEC) 75 MG TBDP Take 1 tablet (75 mg total) by mouth daily as needed. For migraines. Take as close to onset of migraine as possible. One daily maximum.   Vitamin D , Ergocalciferol , 50000 units CAPS TAKE ONE CAPSULE BY MOUTH EVERY 7 DAYS   [DISCONTINUED]  levothyroxine  (SYNTHROID ) 75 MCG tablet Take 1 tablet (75 mcg total) by mouth daily before breakfast.   [DISCONTINUED] omeprazole  (PRILOSEC) 20 MG capsule Take 1 capsule (20 mg total) by mouth daily.   [DISCONTINUED] rosuvastatin  (CRESTOR ) 10 MG tablet TAKE ONE TABLET BY MOUTH DAILY   [DISCONTINUED] venlafaxine  XR (EFFEXOR -XR) 150 MG 24 hr capsule Take 1 capsule (150 mg total) by mouth daily.   levothyroxine  (SYNTHROID ) 75 MCG tablet Take 1 tablet (75 mcg total) by mouth daily before breakfast.   omeprazole  (PRILOSEC) 20 MG capsule Take 1 capsule (20 mg total) by mouth daily.   rosuvastatin  (CRESTOR ) 10 MG tablet Take 1 tablet (10 mg total) by mouth daily.   venlafaxine  XR (EFFEXOR -XR) 150 MG 24 hr capsule Take 1 capsule (150 mg total) by mouth daily.   No facility-administered encounter medications on file as of 02/06/2024.    Review of Systems  Review of Systems  Constitutional: Negative.   HENT: Negative.    Cardiovascular: Negative.   Gastrointestinal: Negative.   Allergic/Immunologic: Negative.   Neurological: Negative.   Psychiatric/Behavioral: Negative.       Objective:   BP 98/70   Pulse 96   Temp 98.6 F (37 C) (Oral)   Wt 180 lb 3.2 oz (81.7 kg)   SpO2 96%   BMI 35.19 kg/m   Wt Readings from Last 5 Encounters:  02/06/24 180 lb 3.2 oz (81.7 kg)  11/05/23 182 lb (82.6 kg)  10/25/23 180 lb 9.6 oz (81.9 kg)  05/15/23 204 lb (92.5 kg)  04/02/23 198 lb 3.2 oz (89.9 kg)     Physical Exam Vitals and nursing note reviewed.  Constitutional:      General: She is not in acute distress.    Appearance: She is well-developed.  Cardiovascular:     Rate and Rhythm: Normal rate and regular rhythm.  Pulmonary:     Effort: Pulmonary effort is normal.     Breath sounds: Normal breath sounds.  Neurological:     Mental Status: She is alert and oriented to person, place, and time.       Assessment & Plan:   Hypothyroidism, unspecified type -     TSH -     CBC -      Comprehensive metabolic panel with GFR  Anxiety and depression -     Ambulatory referral to Psychiatry  Adjustment disorder with mixed anxiety and depressed mood -     Venlafaxine  HCl ER; Take 1 capsule (150 mg total) by mouth daily.  Dispense: 30 capsule; Refill: 11  Other orders -     Levothyroxine  Sodium; Take 1 tablet (75 mcg total) by mouth daily before breakfast.  Dispense: 30 tablet; Refill: 2 -     Omeprazole ; Take 1 capsule (20 mg total) by mouth daily.  Dispense: 30 capsule; Refill: 0 -     Rosuvastatin  Calcium ; Take 1 tablet (10 mg total) by mouth daily.  Dispense: 30 tablet; Refill: 11     Return in about 3 months (  around 05/08/2024).   Mikayla Morel, NP 02/06/2024

## 2024-02-06 NOTE — Patient Instructions (Signed)
 1. Hypothyroidism, unspecified type (Primary)  - TSH - CBC - Comprehensive metabolic panel with GFR  2. Anxiety and depression  - Ambulatory referral to Psychiatry  3. Adjustment disorder with mixed anxiety and depressed mood  - venlafaxine  XR (EFFEXOR -XR) 150 MG 24 hr capsule; Take 1 capsule (150 mg total) by mouth daily.  Dispense: 30 capsule; Refill: 11

## 2024-02-07 ENCOUNTER — Ambulatory Visit: Payer: Self-pay | Admitting: Nurse Practitioner

## 2024-02-07 LAB — TSH: TSH: 1.64 u[IU]/mL (ref 0.450–4.500)

## 2024-02-07 LAB — COMPREHENSIVE METABOLIC PANEL WITH GFR
ALT: 18 IU/L (ref 0–32)
AST: 27 IU/L (ref 0–40)
Albumin: 4 g/dL (ref 3.9–4.9)
Alkaline Phosphatase: 74 IU/L (ref 44–121)
BUN/Creatinine Ratio: 16 (ref 12–28)
BUN: 17 mg/dL (ref 8–27)
Bilirubin Total: 0.3 mg/dL (ref 0.0–1.2)
CO2: 20 mmol/L (ref 20–29)
Calcium: 9.9 mg/dL (ref 8.7–10.3)
Chloride: 100 mmol/L (ref 96–106)
Creatinine, Ser: 1.04 mg/dL — ABNORMAL HIGH (ref 0.57–1.00)
Globulin, Total: 3 g/dL (ref 1.5–4.5)
Glucose: 87 mg/dL (ref 70–99)
Potassium: 5.1 mmol/L (ref 3.5–5.2)
Sodium: 136 mmol/L (ref 134–144)
Total Protein: 7 g/dL (ref 6.0–8.5)
eGFR: 60 mL/min/{1.73_m2} (ref >59)

## 2024-02-07 LAB — CBC
Hematocrit: 42.9 % (ref 34.0–46.6)
Hemoglobin: 13.7 g/dL (ref 11.1–15.9)
MCH: 29.6 pg (ref 26.6–33.0)
MCHC: 31.9 g/dL (ref 31.5–35.7)
MCV: 93 fL (ref 79–97)
Platelets: 377 10*3/uL (ref 150–450)
RBC: 4.63 x10E6/uL (ref 3.77–5.28)
RDW: 14.2 % (ref 11.7–15.4)
WBC: 7.6 10*3/uL (ref 3.4–10.8)

## 2024-02-27 ENCOUNTER — Ambulatory Visit: Attending: Internal Medicine | Admitting: Internal Medicine

## 2024-02-27 VITALS — BP 114/60 | HR 97 | Ht 60.0 in | Wt 177.0 lb

## 2024-02-27 DIAGNOSIS — I493 Ventricular premature depolarization: Secondary | ICD-10-CM | POA: Diagnosis present

## 2024-02-27 NOTE — Progress Notes (Signed)
 HPI [name] returns today for evaluation of PVCs and NSVT. She is a pleasant 68 yo woman with a h/o HTN, dyslipidemia and remote atrial myxoma s/p resection. She note palpitations. She has not angina. She does have a h/o atypical chest pain. Since starting the flecainide , her palpitations are much improved.  However the flecainide  was stopped and she has some palpitations. She has lost over 25 pounds since her last visit. No syncope.  Allergies  Allergen Reactions   Aspirin  Other (See Comments)    Can take baby aspirin  (heart flutters with high doses)   Ibuprofen [Ibuprofen] Nausea And Vomiting   Penicillins Nausea And Vomiting    Has patient had a PCN reaction causing immediate rash, facial/tongue/throat swelling, SOB or lightheadedness with hypotension: Yes Has patient had a PCN reaction causing severe rash involving mucus membranes or skin necrosis: No Has patient had a PCN reaction that required hospitalization No Has patient had a PCN reaction occurring within the last 10 years: No If all of the above answers are NO, then may proceed with Cephalosporin use.      Current Outpatient Medications  Medication Sig Dispense Refill   fenofibrate  (TRICOR ) 48 MG tablet Take 1 tablet (48 mg total) by mouth daily. 15 tablet 0   Galcanezumab -gnlm (EMGALITY ) 120 MG/ML SOAJ Inject 120 mg into the skin every 30 (thirty) days. 1 mL 11   hydrocortisone  (CORTEF ) 10 MG tablet take 15 MG (1&1/2 tablets) with Breakfast AND 10 MG (ONE tablet) between 2-4 as needed daily] 90 tablet 0   hydrOXYzine  (ATARAX ) 10 MG tablet TAKE ONE TABLET BY MOUTH AT BEDTIME AS NEEDED 30 tablet 0   levothyroxine  (SYNTHROID ) 75 MCG tablet Take 1 tablet (75 mcg total) by mouth daily before breakfast. 30 tablet 2   nitroGLYCERIN  (NITROSTAT ) 0.4 MG SL tablet Place 1 tablet (0.4 mg total) under the tongue every 5 (five) minutes x 3 doses as needed for chest pain. 25 tablet 12   omega-3 acid ethyl esters (LOVAZA ) 1 g  capsule TAKE ONE CAPSULE BY MOUTH TWICE DAILY 60 capsule 3   omeprazole  (PRILOSEC) 20 MG capsule Take 1 capsule (20 mg total) by mouth daily. 30 capsule 0   ondansetron  (ZOFRAN -ODT) 4 MG disintegrating tablet Take 1 tablet (4 mg total) by mouth every 8 (eight) hours as needed for nausea or vomiting. 20 tablet 0   Rimegepant Sulfate (NURTEC) 75 MG TBDP Take 1 tablet (75 mg total) by mouth daily as needed. For migraines. Take as close to onset of migraine as possible. One daily maximum. 8 tablet 11   rosuvastatin  (CRESTOR ) 10 MG tablet Take 1 tablet (10 mg total) by mouth daily. 30 tablet 11   venlafaxine  XR (EFFEXOR -XR) 150 MG 24 hr capsule Take 1 capsule (150 mg total) by mouth daily. 30 capsule 11   Vitamin D , Ergocalciferol , 50000 units CAPS TAKE ONE CAPSULE BY MOUTH EVERY 7 DAYS 5 capsule 0   aluminum -magnesium  hydroxide-simethicone  (MAALOX) 200-200-20 MG/5ML SUSP Take 30 mLs by mouth 4 (four) times daily -  before meals and at bedtime. (Patient not taking: Reported on 02/27/2024) 355 mL 1   aspirin  EC 81 MG tablet Take 81 mg by mouth daily. Swallow whole. (Patient not taking: Reported on 02/27/2024)     No current facility-administered medications for this visit.     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   Asthma    Atrial myxoma    Blindness of left eye 1978   Blood transfusion    Breast cyst    Constipation    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HCC)    CVA (cerebral infarction)    Depression    Depression    GERD (gastroesophageal reflux disease)    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HCC)    Hemorrhoids    History of open heart surgery 1977   Hyperlipemia    Hypothyroidism    per office note dated 03/25/2014   Hypoxia    Migraine    Peptic ulcer associated with Helicobacter pylori infection    Reflux    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   Past Surgical History:  Procedure Laterality Date   ADRENAL GLAND SURGERY  09/11/1980   on hydrocortisone    ATRIAL MYXOMA EXCISION  09/11/1976   BREAST BIOPSY  07/13/1999   ductal tissue excision & bx right; bx only left breast   COLONOSCOPY     EYE SURGERY     LEFT HEART CATHETERIZATION WITH CORONARY ANGIOGRAM N/A 10/06/2011   Procedure: LEFT HEART CATHETERIZATION WITH CORONARY ANGIOGRAM;  Surgeon: Sharene Dauer, MD;  Location: [facility]ATH [facility];  Service: Cardiovascular;  Laterality: N/A;   ORBITOTOMY Right 07/27/2021   POLYPECTOMY     repaired cross eyed     as a child   ROTATOR CUFF REPAIR  05/12/2005   left   VAGINAL HYSTERECTOMY  09/11/1998   partial; w/left ovary     Family History  Problem Relation Age of Onset   Acromegaly Father    Heart disease Father        Tumor in the heart   Stomach cancer Father    Colon cancer Sister 60   Diabetes Sister    Kidney disease Sister        x2   Heart disease Sister        Tumor in the heart   Heart disease Daughter        Tumor in the heart   Colon polyps Neg Hx    Esophageal cancer Neg Hx    Gallbladder disease Neg Hx    Migraines Neg Hx    Rectal cancer Neg Hx      Social History   Socioeconomic History   Marital status: Married    Spouse name: Domenica Fried   Number of children: 2   Years of education: Not on file   Highest education level: Some college, no degree  Occupational History   Occupation: Disabled  Tobacco Use   Smoking status: Never   Smokeless tobacco: Never  Vaping Use   Vaping status: Never Used  Substance and Sexual Activity   Alcohol use: No   Drug use: No   Sexual activity: Not Currently  Other Topics Concern   Not on file  Social History Narrative   Lives w husband and son   Left handed   Caffeine : none    Social Drivers of Corporate investment banker Strain: Not on file  Food Insecurity: No Food  Insecurity (02/06/2024)   Hunger Vital Sign    Worried About Running Out of Food in the Last Year: Never true    Ran Out of Food in the Last Year: Never true  Transportation Needs: No Transportation Needs (02/06/2024)   PRAPARE - Administrator, Civil Service (Medical): No    Lack of Transportation (Non-Medical): No  Physical Activity: Not on file  Stress: Not on file  Social Connections: Not on file  Intimate Partner Violence: Not on file     BP 114/60   Pulse 97   Ht 5' (1.524 m)   Wt 177 lb (80.3 kg)   SpO2 97%   BMI 34.57 kg/m   Physical Exam:  Well appearing NAD HEENT: Unremarkable Neck:  No JVD, no thyromegally Lymphatics:  No adenopathy Back:  No CVA tenderness Lungs:  Clear HEART:  Regular rate rhythm, no murmurs, no rubs, no clicks Abd:  soft, positive bowel sounds, no organomegally, no rebound, no guarding Ext:  2 plus pulses, no edema, no cyanosis, no clubbing Skin:  No rashes no nodules Neuro:  CN II through XII intact, motor grossly intact   Assess/Plan:  Obesity - she is encouraged to reach out to her primary to learn about weight loss options Symptomatic NSVT/PVC' s- She has had some more symptoms since stopping the flecainide . I have recommended she wear a zio monitor to better assess the burden of her arrhythmias offf of  Atypical chest pain - she has a negative coronary CT Syncope - None recently.    Pete Brand Lahela Woodin,MD

## 2024-02-27 NOTE — Patient Instructions (Signed)
 Medication Instructions:  Your physician recommends that you continue on your current medications as directed. Please refer to the Current Medication list given to you today.  *If you need a refill on your cardiac medications before your next appointment, please call your pharmacy*  Lab Work: None ordered.  You may go to any [facility] Location for your lab work:  [facility] - [address] ([facility]) - [address] [phone] [address]   - [address]   [place]  - [address]   [place] - [address] - [address] [employer]  - [address] ([facility]   If you have labs (blood work) drawn today and your tests are completely normal, you will receive your results only by: [employer] (if you have MyChart)  If you have any lab test that is abnormal or we need to change your treatment, we will call you or send a MyChart message to review the results.  Testing/Procedures: [name]- Long Term Monitor Instructions  Your physician has requested you wear a ZIO patch monitor for 3 days.   This is a single patch monitor. [employer] supplies one patch monitor per enrollment. Additional  stickers are not available. Please do not apply patch if you will be having a Nuclear Stress Test,  Echocardiogram, Cardiac CT, MRI, or Chest Xray during the period you would be wearing the  monitor. The patch cannot be worn during these tests. You cannot remove and re-apply the  ZIO XT patch monitor.   Your ZIO patch monitor will be mailed 3 day USPS to your address on file. It may take 3-5 days  to receive your monitor after you have been enrolled.   Once you have received your monitor, please review the enclosed instructions. Your monitor  has already been registered assigning a specific monitor serial # to you.     Billing and Patient Assistance  Program Information  We have supplied [employer] with any of your insurance information on file for billing purposes.  [employer] offers a sliding scale Patient Assistance Program for patients that do not have  insurance, or whose insurance does not completely cover the cost of the ZIO monitor.  You must apply for the Patient Assistance Program to qualify for this discounted rate.   To apply, please call [employer] at [phone], select option 4, select option 2, ask to apply for  Patient Assistance Program. Sanna Crystal will ask your household income, and how many people  are in your household. They will quote your out-of-pocket cost based on that information.  [employer] will also be able to set up a 39-month, interest-free payment plan if needed.     Applying the monitor  Shave hair from upper left chest.  Hold abrader disc by orange tab. Rub abrader in 40 strokes over the upper left chest as  indicated in your monitor instructions.  Clean area with 4 enclosed alcohol pads. Let dry.  Apply patch as indicated in monitor instructions. Patch will be placed under collarbone on left  side of chest with arrow pointing upward.  Rub patch adhesive wings for 2 minutes. Remove white label marked 1. Remove the white  label marked 2. Rub patch adhesive wings for 2 additional minutes.  While looking in a mirror, press and release button in center of patch. A small green light  will  flash 3-4 times. This will be your only indicator that the monitor has been turned on.  Do not shower for the first 24 hours. You may shower after the first 24 hours.  Press the button if you feel a symptom. You will hear a small click. Record Date, Time and  Symptom in the Patient Logbook.  When you are ready to remove the patch, follow instructions on the last 2 pages of Patient  Logbook. Stick patch monitor onto the last page of Patient Logbook.   Place Patient Logbook in the blue and white box. Use locking tab on box and tape  box closed  securely. The blue and white box has prepaid postage on it. Please place it in the mailbox as  soon as possible. Your physician should have your test results approximately 7 days after the  monitor has been mailed back to [facility].   Call [facility] Customer Care at [phone] if you have questions regarding  your ZIO XT patch monitor. Call them immediately if you see an orange light blinking on your  monitor.   If your monitor falls off in less than 4 days, contact our Monitor department at [phone].   If your monitor becomes loose or falls off after 4 days call [employer] at [phone] for  suggestions on securing your monitor.     Follow-Up: At [facility], you and your health needs are our priority.  As part of our continuing mission to provide you with exceptional heart care, we have created designated Provider Care Teams.  These Care Teams include your primary Cardiologist (physician) and Advanced Practice Providers (APPs -  Physician Assistants and Nurse Practitioners) who all work together to provide you with the care you need, when you need it.  Your next appointment:   To determine after testing  The format for your next appointment:   In Person  Provider:   Manya Sells, [name]{or one of the following Advanced Practice Providers on your designated Care Team:   Mikayla Abrahams, [name] Mikayla Andy Tillery, [name] Mikayla Ollis, [name]

## 2024-03-13 ENCOUNTER — Other Ambulatory Visit: Payer: Self-pay | Admitting: Nurse Practitioner

## 2024-03-13 DIAGNOSIS — E559 Vitamin D deficiency, unspecified: Secondary | ICD-10-CM

## 2024-03-13 NOTE — Telephone Encounter (Signed)
 Please advise [facility]

## 2024-03-24 ENCOUNTER — Telehealth: Payer: Self-pay | Admitting: Nurse Practitioner

## 2024-03-24 ENCOUNTER — Other Ambulatory Visit: Payer: Self-pay | Admitting: Nurse Practitioner

## 2024-03-24 DIAGNOSIS — E781 Pure hyperglyceridemia: Secondary | ICD-10-CM

## 2024-03-24 NOTE — Telephone Encounter (Unsigned)
 Copied from CRM [id number]. Topic: Clinical - Medication Refill >> Mar 24, 2024  4:35 PM [name] wrote: Medication: omeprazole  (PRILOSEC) 20 MG capsule fenofibrate  (TRICOR ) 48 MG tablet  Has the patient contacted their pharmacy? Yes (Agent: If no, request that the patient contact the pharmacy for the refill. If patient does not wish to contact the pharmacy document the reason why and proceed with request.) (Agent: If yes, when and what did the pharmacy advise?)  This is the patient's preferred pharmacy:  [facility] - [place], [place] - [address] [place] [address] Phone: [phone] Fax: [phone]  Is this the correct pharmacy for this prescription? Yes If no, delete pharmacy and type the correct one.   Has the prescription been filled recently? Yes  Is the patient out of the medication? Yes  Has the patient been seen for an appointment in the last year OR does the patient have an upcoming appointment? Yes  Can we respond through MyChart? Yes  Agent: Please be advised that Rx refills may take up to 3 business days. We ask that you follow-up with your pharmacy.

## 2024-03-25 MED ORDER — OMEPRAZOLE 20 MG PO CPDR: 20.0000 mg | DELAYED_RELEASE_CAPSULE | Freq: Every day | ORAL | 0 refills | Status: DC

## 2024-03-25 MED ORDER — FENOFIBRATE 48 MG PO TABS: 48.0000 mg | ORAL_TABLET | Freq: Every day | ORAL | 0 refills | Status: DC

## 2024-04-22 ENCOUNTER — Other Ambulatory Visit: Payer: Self-pay | Admitting: Nurse Practitioner

## 2024-04-22 DIAGNOSIS — E781 Pure hyperglyceridemia: Secondary | ICD-10-CM

## 2024-05-01 ENCOUNTER — Other Ambulatory Visit: Payer: Self-pay | Admitting: Nurse Practitioner

## 2024-05-01 ENCOUNTER — Telehealth: Payer: Self-pay

## 2024-05-01 NOTE — Telephone Encounter (Signed)
 Copied from CRM #[id number]. Topic: Clinical - Prescription Issue >> May 01, 2024 11:26 AM Tinnie BROCKS wrote: Reason for CRM: Pt's son Lonni called in to check on omeprazole  refill. He was wanting to know why it seems like she is running out of the medication every month and missing doses before they can pick it up at the pharmacy. I let him know to make sure this med in particular is on auto-refill with the pharmacy, because we didn't get a refill request until about an hour ago. He wanted the nurses to know that she is out of the medication currently and he wants to know if there's anything he can do to make sure this doesn't keep happening. I let him know it can be up to 3 more business days for the refill.

## 2024-05-02 ENCOUNTER — Other Ambulatory Visit: Payer: Self-pay | Admitting: Nurse Practitioner

## 2024-05-05 ENCOUNTER — Ambulatory Visit: Payer: Self-pay | Admitting: Nurse Practitioner

## 2024-05-09 ENCOUNTER — Ambulatory Visit: Payer: Self-pay | Admitting: Nurse Practitioner

## 2024-05-19 ENCOUNTER — Telehealth: Payer: Self-pay | Admitting: Adult Health

## 2024-05-19 NOTE — Progress Notes (Deleted)
 [facility]    Primary neurologist:  Dr Ines Primary Care Provider:  Oley Bascom RAMAN, NP Reason for visit: Migraine follow-up   No chief complaint on file.     HPI:   Update 05/20/2024 JM:         History provided for reference purposes only Update 10/25/2023 JM: Patient returns for follow-up visit accompanied by her son.  She does not use Emgality  consistently every month, may not use for 1 month if she hasn't had any migraines and other times she may forget to do the injection.  When she is consistently on Emgality , migraines are well-controlled.  She has had increased migraines over the past month.  Use of Nurtec with benefit.  Update 10/19/2022 JM: Patient returns for migraine follow-up visit.  She has remained on Emgality  with great control of migraine frequency, currently experiencing 1 migraine day per month.  Use of Nurtec with benefit.  Was seen by Dr. Chalice for possible underlying sleep apnea in 02/2022, ordered sleep study at that time but she no showed sleep study in 03/2022. She does not wish to pursue further sleep testing at this time.   Does report issues with chronic lower back pain and bilateral foot pain. Did see PCP back in November who recommended xray and PT but has not yet pursued these.  Update 10/13/2021 JM: returns for migraine follow up accompanied by her son, [place]ford.  Previously saw Dr. Ines on 05/25/2020 for initial consult visit.  Started on Emgality  and Nurtec.  Reports migraine headaches currently occurring x1 per month (previously daily). Will use Nurtec with great benefit.  Tolerating both well without side effects. Did not complete sleep study as previously ordered. Does continue to have insomnia and daytime fatigue.  No new concerns at this time.   Consult visit 05/25/2020 Dr. Ines:  Nicoletta JAYSON Gearing is a 68 y.o. female here as requested by Oley Bascom RAMAN, NP for migraines.  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 I reviewed epic notes, patient was seen in the emergency room in July of this year for migraine: She had a sudden onset constant diffuse chest pain, the night before she started having migraine headaches, she tried to go to sleep and ignore it however it kept her up around 5 AM she took a Fioricet , when she woke up up later in the morning and she reported the headache had resolved. she also reported other symptoms such as shortness of breath, nausea, emesis, diaphoresis, EMS was called, pain started with a migraine headache but then started having chest pain after taking Fioricet , when seen in the emergency room she was not have a headache and was there for more for the other symptoms.  It appears as though she was also admitted back in January for Covid positive and migraine, with acute hypoxic respiratory failure, non-intractable migraine, acute kidney injury, chronic adrenal insufficiency on home hydrocortisone , hyperlipidemia, hypothyroidism, depression and previous CVA (or MI really unclear).  Migraines started at the age of 25 after bing it in the head. Here with her son who also provides much information. The older she gets the worse she gets. Headaches start in the thr front of the head, continuous headache. Never had a sleep test. Wakes with dry mouth in the morning. Wakes up with headaches every day. Headaches every day, continuous just waxes and wanes  all day, worsening 6 months ago with intractable headaches, no trauma, nothing maes it better or worse. Starts on the right side if a migraines or on the top, pulsating, pounding throbbing, light and sound and smell, significant nausea, vomiting, movement makes it worse, it is positional worse in the morning, can wake her up at night, she is also having vision changes in her right eye  movement and changing positions make it worse. 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 Migraines daily as well.  Headaches daily.  Continuous headache.  No medication overuse, denies.  No aura.  Ongoing for years but worsening over the last 6 months with vision changes, nocturnal and morning headaches, positional quality.  She is tried and failed multiple medications including preventatives in acute.  We discussed her options, feel an MRI of the brain is warranted, would not hesitate to start Botox and we will initiate CGRP. No other focal neurologic deficits, associated symptoms, inciting events or modifiable factors.  Reviewed notes, labs and imaging from outside physicians, which showed:    CT head 09/2019: IMPRESSION: Personally reviewed and agree with the following: 1. No acute intracranial abnormalities. 2. Significant mucosal thickening throughout the paranasal sinuses as above with no air-fluid levels.   Review of Systems: Patient complains of symptoms per HPI as well as the following symptoms: Those listed in HPI.  Pertinent negatives and positives per HPI. All others negative.   Social History   Socioeconomic History   Marital status: Married    Spouse name: Nadara   Number of children: 2   Years of education: Not on file   Highest education level: Some college, no degree  Occupational History   Occupation: Disabled  Tobacco Use   Smoking status: Never   Smokeless tobacco: Never  Vaping Use   Vaping status: Never Used  Substance and Sexual Activity   Alcohol use: No   Drug use: No   Sexual activity: Not Currently  Other Topics Concern   Not on file  Social History Narrative   Lives w husband and son   Left handed   Caffeine : none    Social Drivers of Corporate investment banker Strain: Not on file  Food Insecurity:  No Food Insecurity (02/06/2024)   Hunger Vital Sign    Worried About Running Out of Food in the Last Year: Never true    Ran Out of Food in the Last Year: Never true  Transportation Needs: No Transportation Needs (02/06/2024)   PRAPARE - Administrator, Civil Service (Medical): No    Lack of Transportation (Non-Medical): No  Physical Activity: Not on file  Stress: Not on file  Social Connections: Not on file  Intimate Partner Violence: Not on file    Family History  Problem Relation Age of Onset   Acromegaly Father    Heart disease Father        Tumor in the heart   Stomach cancer Father    Colon cancer Sister 33   Diabetes Sister    Kidney disease Sister        x2   Heart disease Sister        Tumor in the heart   Heart disease Daughter        Tumor in the heart   Colon  polyps Neg Hx    Esophageal cancer Neg Hx    Gallbladder disease Neg Hx    Migraines Neg Hx    Rectal cancer Neg Hx     Past Medical History:  Diagnosis Date   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HCC)    Allergy    Amblyopia    Anemia    Arthritis    in my lower back   Asthma    Atrial myxoma    Blindness of left eye 1978   Blood transfusion    Breast cyst    Constipation    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HCC)    CVA (cerebral infarction)    Depression    Depression    GERD (gastroesophageal reflux disease)    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HCC)    Hemorrhoids    History of open heart surgery 1977   Hyperlipemia    Hypothyroidism    per office note dated 03/25/2014   Hypoxia    Migraine    Peptic ulcer associated with Helicobacter pylori infection    Reflux    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; leaving me blind    Patient Active Problem List   Diagnosis Date Noted   Hypertriglyceridemia 04/02/2023   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HCC) 07/21/2022   Chronic bilateral low back pain with right-sided sciatica 07/13/2022   Vitamin D  deficiency 04/03/2022    Intractable episodic cluster headache 02/22/2022   History of COVID-19 02/22/2022   Morbid obesity with BMI of 40.0-44.9, adult (HCC) 02/22/2022   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   Hypersomnia with sleep apnea 02/22/2022   Psychophysiological insomnia 02/22/2022   Witnessed episode of apnea 02/22/2022   Abnormal CT of the abdomen 01/30/2022   Snoring 01/05/2022   AKI (acute kidney injury) (HCC) 12/24/2021   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   NSVT (nonsustained ventricular tachycardia) (HCC) 03/08/2021   New-onset angina (HCC) 12/04/2020   Chronic migraine without aura, with intractable migraine, so stated, with status migrainosus 05/25/2020   Frequent PVCs    COVID-19 10/10/2019   Hypoxia    Migraine with aura and without status migrainosus, not intractable    Tachycardia    ARF (acute renal failure) (HCC) 12/06/2018   Fever 12/06/2018   Acute hypoxemic respiratory failure (HCC) 12/06/2018   Headache 11/02/2013   Dizziness 11/02/2013   Anemia 01/10/2013   Chest pain, unspecified 01/10/2013   Adjustment disorder with mixed anxiety and depressed mood 01/10/2013   Adrenal crisis (HCC) 01/07/2013   Nausea & vomiting 08/03/2011   Hypotension 08/03/2011   Adrenal insufficiency (HCC) 08/03/2011   Hyponatremia 08/03/2011   Hypothyroidism 08/03/2011   Depression 08/03/2011    Past Surgical History:  Procedure Laterality Date   ADRENAL GLAND SURGERY  09/11/1980   on hydrocortisone    ATRIAL MYXOMA EXCISION  09/11/1976   BREAST BIOPSY  07/13/1999   ductal tissue excision & bx right; bx only left breast   COLONOSCOPY     EYE SURGERY     LEFT HEART CATHETERIZATION WITH CORONARY ANGIOGRAM N/A 10/06/2011   Procedure: LEFT HEART CATHETERIZATION WITH CORONARY ANGIOGRAM;  Surgeon: Rober LOISE Chroman, MD;  Location: [facility] CATH [facility];  Service: Cardiovascular;  Laterality: N/A;   ORBITOTOMY Right 07/27/2021   POLYPECTOMY     repaired cross eyed     as a child   ROTATOR CUFF REPAIR  05/12/2005    left   VAGINAL HYSTERECTOMY  09/11/1998   partial; w/left ovary    Current Outpatient Medications  Medication  Sig Dispense Refill   aluminum -magnesium  hydroxide-simethicone  (MAALOX) 200-200-20 MG/5ML SUSP Take 30 mLs by mouth 4 (four) times daily -  before meals and at bedtime. (Patient not taking: Reported on 02/27/2024) 355 mL 1   aspirin  EC 81 MG tablet Take 81 mg by mouth daily. Swallow whole. (Patient not taking: Reported on 02/27/2024)     fenofibrate  (TRICOR ) 48 MG tablet Take 1 tablet (48 mg total) by mouth daily. 15 tablet 0   Galcanezumab -gnlm (EMGALITY ) 120 MG/ML SOAJ Inject 120 mg into the skin every 30 (thirty) days. 1 mL 11   hydrocortisone  (CORTEF ) 10 MG tablet take 15 MG (1&1/2 tablets) with Breakfast AND 10 MG (ONE tablet) between 2-4 as needed daily] 90 tablet 0   hydrOXYzine  (ATARAX ) 10 MG tablet TAKE ONE TABLET BY MOUTH AT BEDTIME AS NEEDED 30 tablet 0   levothyroxine  (SYNTHROID ) 75 MCG tablet Take 1 tablet (75 mcg total) by mouth daily before breakfast. 30 tablet 2   nitroGLYCERIN  (NITROSTAT ) 0.4 MG SL tablet Place 1 tablet (0.4 mg total) under the tongue every 5 (five) minutes x 3 doses as needed for chest pain. 25 tablet 0   omega-3 acid ethyl esters (LOVAZA ) 1 g capsule TAKE ONE CAPSULE BY MOUTH TWICE DAILY 60 capsule 3   omeprazole  (PRILOSEC) 20 MG capsule Take 1 capsule (20 mg total) by mouth daily. 30 capsule 0   ondansetron  (ZOFRAN -ODT) 4 MG disintegrating tablet Take 1 tablet (4 mg total) by mouth every 8 (eight) hours as needed for nausea or vomiting. 20 tablet 0   Rimegepant Sulfate (NURTEC) 75 MG TBDP Take 1 tablet (75 mg total) by mouth daily as needed. For migraines. Take as close to onset of migraine as possible. One daily maximum. 8 tablet 11   rosuvastatin  (CRESTOR ) 10 MG tablet Take 1 tablet (10 mg total) by mouth daily. 30 tablet 11   venlafaxine  XR (EFFEXOR -XR) 150 MG 24 hr capsule Take 1 capsule (150 mg total) by mouth daily. 30 capsule 11   Vitamin D ,  Ergocalciferol , (DRISDOL ) 1.25 MG (50000 UNIT) CAPS capsule TAKE ONE CAPSULE BY MOUTH EVERY SEVEN DAYS 5 capsule 5   No current facility-administered medications for this visit.    Allergies as of 05/20/2024 - Review Complete 02/06/2024  Allergen Reaction Noted   Aspirin  Other (See Comments) 10/03/2011   Ibuprofen [ibuprofen] Nausea And Vomiting 08/02/2011   Penicillins Nausea And Vomiting 06/02/2011    Vitals: There were no vitals filed for this visit.  There is no height or weight on file to calculate BMI.   Physical exam: Exam: Gen: NAD, very pleasant middle-aged African-American female, conversant, well nourised, obese, well groomed                     CV: RRR, no MRG. No Carotid Bruits. No peripheral edema, warm, nontender  Neuro: Detailed Neurologic Exam  Speech:    Speech is normal; fluent and spontaneous with normal comprehension.  Cognition:    The patient is oriented to person, place, and time;     recent and remote memory intact;     language fluent;     normal attention, concentration,     fund of knowledge Cranial Nerves:     Trigeminal sensation is intact and the muscles of mastication are normal. The face is symmetric. The palate elevates in the midline. Hearing intact. Voice is normal. Shoulder shrug is normal. The tongue has normal motion without fasciculations.   Coordination:    Normal finger to  nose and heel to shin. Normal rapid alternating movements.   Gait:    Deferred, seated in wheelchair  Motor Observation:    No asymmetry, no atrophy, and no involuntary movements noted. Tone:    Normal muscle tone.      Strength:    Strength is V/V in the upper and lower limbs.                 Assessment/Plan:  68 year old with chronic migraines followed by Dr. Ines.  Initially referred due to worsening migraine headaches, vision changes in right eye with swelling and positional headaches.  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    -Difficulty remembering monthly Emgality  injections, discussed other options such as Qulipta or Vyepti, patient and son wish to continue with Emgality  and will try to do better to remember monthly injection as migraines well-controlled with routine Emgality  injections.  Discussed ways to help remember this.  They were advised to call office if they wish to change therapies in the future -Continue Nurtec as needed for emergent management. Refills provided. Triptans contraindicated due to history of stroke and CAD     She will follow-up in 6 months or call earlier if needed    No orders of the defined types were placed in this encounter.  No orders of the defined types were placed in this encounter.   CC: Oley Bascom RAMAN, NP   I personally spent a total of *** minutes in the care of the patient today including {Time Based Coding:210964241}.   Harlene Bogaert, AGNP-BC  [facility] [address] [place], [place] [address]  Phone [phone] Fax [phone] Note: This document was prepared with digital dictation and possible smart phrase technology. Any transcriptional errors that result from this process are unintentional.

## 2024-05-19 NOTE — Telephone Encounter (Signed)
 Patient called for an earlier appointment time.

## 2024-05-20 ENCOUNTER — Ambulatory Visit: Payer: Medicare Other | Admitting: Adult Health

## 2024-05-21 ENCOUNTER — Other Ambulatory Visit: Payer: Self-pay | Admitting: Nurse Practitioner

## 2024-05-21 DIAGNOSIS — E781 Pure hyperglyceridemia: Secondary | ICD-10-CM

## 2024-05-21 NOTE — Telephone Encounter (Signed)
 Called pt to remind her that she needs to follow up with Tonya . No answer and could not lvm. Will send in 15 day supply . KH

## 2024-06-06 ENCOUNTER — Other Ambulatory Visit: Payer: Self-pay | Admitting: Nurse Practitioner

## 2024-06-06 ENCOUNTER — Other Ambulatory Visit: Payer: Self-pay

## 2024-06-06 ENCOUNTER — Ambulatory Visit: Payer: Self-pay

## 2024-06-06 NOTE — Telephone Encounter (Signed)
Appt made Kh

## 2024-06-06 NOTE — Telephone Encounter (Signed)
 FYI Only or Action Required?: FYI only for provider.  Patient was last seen in primary care on 02/06/2024 by Oley Bascom RAMAN, NP.  Called Nurse Triage reporting No chief complaint on file..  Symptoms began today.  Interventions attempted: Rest, hydration, or home remedies.  Symptoms are: gradually worsening.  Triage Disposition: No disposition on file.  Patient/caregiver understands and will follow disposition?:   Copied from CRM [id number]. Topic: Clinical - Red Word Triage >> Jun 06, 2024 11:22 AM Montie POUR wrote: Red Word that prompted transfer to Nurse Triage:  She is out of omeprazole  (PRILOSEC) 20 MG capsule. She is throwing up today and pain level is at a 10  ----------------------------------------------------------------------- Reason for Disposition  [1] SEVERE pain (e.g., excruciating) AND [2] present > 1 hour  Answer Assessment - Initial Assessment Questions Pt son calls in to request refill on medications; but pt is having severe 10/10 abd pain and NV, admits to pink/blood tinged emesis. Chronic stomach ulcers  1. LOCATION: Where does it hurt?      periumbilical 2. RADIATION: Does the pain shoot anywhere else? (e.g., chest, back)     denies 3. ONSET: When did the pain begin? (e.g., minutes, hours or days ago)      This AM 4. SUDDEN: Gradual or sudden onset?     Sudden when she began throwing up 5. PATTERN Does the pain come and go, or is it constant?     Intermittent with every emesis episode 6. SEVERITY: How bad is the pain?  (e.g., Scale 1-10; mild, moderate, or severe)     10/10 7. RECURRENT SYMPTOM: Have you ever had this type of stomach pain before? If Yes, ask: When was the last time? and What happened that time?      Yes; last month, chronic stomach ulcers 8. CAUSE: What do you think is causing the stomach pain? (e.g., gallstones, recent abdominal surgery)     Ulcers 9. RELIEVING/AGGRAVATING FACTORS: What makes it better or worse?  (e.g., antacids, bending or twisting motion, bowel movement)     Denies; out of rx meds 10. OTHER SYMPTOMS: Do you have any other symptoms? (e.g., back pain, diarrhea, fever, urination pain, vomiting)       NV, admits to pink tinged vomit  Protocols used: Abdominal Pain - [facility]

## 2024-06-06 NOTE — Telephone Encounter (Signed)
Pt was made an appt. Kh

## 2024-06-07 ENCOUNTER — Emergency Department (HOSPITAL_COMMUNITY)

## 2024-06-07 ENCOUNTER — Inpatient Hospital Stay (HOSPITAL_COMMUNITY)
Admission: EM | Admit: 2024-06-07 | Discharge: 2024-06-09 | DRG: 644 | Disposition: A | Discharge status: Home / Self Care | Attending: Internal Medicine | Admitting: Internal Medicine

## 2024-06-07 ENCOUNTER — Other Ambulatory Visit: Payer: Self-pay

## 2024-06-07 DIAGNOSIS — Z8249 Family history of ischemic heart disease and other diseases of the circulatory system: Secondary | ICD-10-CM

## 2024-06-07 DIAGNOSIS — Z91138 Patient's unintentional underdosing of medication regimen for other reason: Secondary | ICD-10-CM

## 2024-06-07 DIAGNOSIS — E2740 Unspecified adrenocortical insufficiency: Secondary | ICD-10-CM | POA: Diagnosis present

## 2024-06-07 DIAGNOSIS — Z8711 Personal history of peptic ulcer disease: Secondary | ICD-10-CM

## 2024-06-07 DIAGNOSIS — F4320 Adjustment disorder, unspecified: Secondary | ICD-10-CM | POA: Diagnosis present

## 2024-06-07 DIAGNOSIS — R109 Unspecified abdominal pain: Secondary | ICD-10-CM | POA: Diagnosis present

## 2024-06-07 DIAGNOSIS — Z79890 Hormone replacement therapy (postmenopausal): Secondary | ICD-10-CM

## 2024-06-07 DIAGNOSIS — T380X6A Underdosing of glucocorticoids and synthetic analogues, initial encounter: Secondary | ICD-10-CM | POA: Diagnosis present

## 2024-06-07 DIAGNOSIS — E871 Hypo-osmolality and hyponatremia: Secondary | ICD-10-CM | POA: Diagnosis present

## 2024-06-07 DIAGNOSIS — R1010 Upper abdominal pain, unspecified: Secondary | ICD-10-CM | POA: Diagnosis not present

## 2024-06-07 DIAGNOSIS — Z886 Allergy status to analgesic agent status: Secondary | ICD-10-CM

## 2024-06-07 DIAGNOSIS — T471X6A Underdosing of other antacids and anti-gastric-secretion drugs, initial encounter: Secondary | ICD-10-CM | POA: Diagnosis present

## 2024-06-07 DIAGNOSIS — K219 Gastro-esophageal reflux disease without esophagitis: Secondary | ICD-10-CM | POA: Diagnosis present

## 2024-06-07 DIAGNOSIS — Z841 Family history of disorders of kidney and ureter: Secondary | ICD-10-CM

## 2024-06-07 DIAGNOSIS — Z880 Allergy status to penicillin: Secondary | ICD-10-CM

## 2024-06-07 DIAGNOSIS — R1013 Epigastric pain: Principal | ICD-10-CM

## 2024-06-07 DIAGNOSIS — F32A Depression, unspecified: Secondary | ICD-10-CM | POA: Diagnosis present

## 2024-06-07 DIAGNOSIS — Z7952 Long term (current) use of systemic steroids: Secondary | ICD-10-CM

## 2024-06-07 DIAGNOSIS — E896 Postprocedural adrenocortical (-medullary) hypofunction: Principal | ICD-10-CM | POA: Diagnosis present

## 2024-06-07 DIAGNOSIS — E785 Hyperlipidemia, unspecified: Secondary | ICD-10-CM | POA: Diagnosis present

## 2024-06-07 DIAGNOSIS — Z90711 Acquired absence of uterus with remaining cervical stump: Secondary | ICD-10-CM

## 2024-06-07 DIAGNOSIS — E039 Hypothyroidism, unspecified: Secondary | ICD-10-CM | POA: Diagnosis present

## 2024-06-07 DIAGNOSIS — I69398 Other sequelae of cerebral infarction: Secondary | ICD-10-CM

## 2024-06-07 DIAGNOSIS — M899 Disorder of bone, unspecified: Secondary | ICD-10-CM | POA: Diagnosis present

## 2024-06-07 DIAGNOSIS — J45909 Unspecified asthma, uncomplicated: Secondary | ICD-10-CM | POA: Diagnosis present

## 2024-06-07 DIAGNOSIS — Z79899 Other long term (current) drug therapy: Secondary | ICD-10-CM

## 2024-06-07 DIAGNOSIS — E860 Dehydration: Secondary | ICD-10-CM | POA: Diagnosis present

## 2024-06-07 DIAGNOSIS — Z833 Family history of diabetes mellitus: Secondary | ICD-10-CM

## 2024-06-07 DIAGNOSIS — Z800 Family history of malignant neoplasm of digestive organs: Secondary | ICD-10-CM

## 2024-06-07 DIAGNOSIS — Z7982 Long term (current) use of aspirin: Secondary | ICD-10-CM

## 2024-06-07 DIAGNOSIS — H5462 Unqualified visual loss, left eye, normal vision right eye: Secondary | ICD-10-CM | POA: Diagnosis present

## 2024-06-07 LAB — COMPREHENSIVE METABOLIC PANEL WITH GFR
ALT: 20 U/L (ref 0–44)
AST: 36 U/L (ref 15–41)
Albumin: 3.9 g/dL (ref 3.5–5.0)
Alkaline Phosphatase: 57 U/L (ref 38–126)
Anion gap: 14 (ref 5–15)
BUN: 22 mg/dL (ref 8–23)
CO2: 19 mmol/L — ABNORMAL LOW (ref 22–32)
Calcium: 11.9 mg/dL — ABNORMAL HIGH (ref 8.9–10.3)
Chloride: 98 mmol/L (ref 98–111)
Creatinine, Ser: 1.16 mg/dL — ABNORMAL HIGH (ref 0.44–1.00)
GFR, Estimated: 52 mL/min — ABNORMAL LOW (ref >60)
Glucose, Bld: 91 mg/dL (ref 70–99)
Potassium: 4.2 mmol/L (ref 3.5–5.1)
Sodium: 131 mmol/L — ABNORMAL LOW (ref 135–145)
Total Bilirubin: 1.3 mg/dL — ABNORMAL HIGH (ref 0.0–1.2)
Total Protein: 7.9 g/dL (ref 6.5–8.1)

## 2024-06-07 LAB — CBC
HCT: 45.1 % (ref 36.0–46.0)
Hemoglobin: 15.1 g/dL — ABNORMAL HIGH (ref 12.0–15.0)
MCH: 29.6 pg (ref 26.0–34.0)
MCHC: 33.5 g/dL (ref 30.0–36.0)
MCV: 88.4 fL (ref 80.0–100.0)
Platelets: 360 K/uL (ref 150–400)
RBC: 5.1 MIL/uL (ref 3.87–5.11)
RDW: 12.7 % (ref 11.5–15.5)
WBC: 7.8 K/uL (ref 4.0–10.5)
nRBC: 0 % (ref 0.0–0.2)

## 2024-06-07 LAB — URINALYSIS, ROUTINE W REFLEX MICROSCOPIC
Bilirubin Urine: NEGATIVE
Glucose, UA: NEGATIVE mg/dL
Ketones, ur: NEGATIVE mg/dL
Nitrite: NEGATIVE
Protein, ur: 100 mg/dL — AB
Specific Gravity, Urine: >1.046 — ABNORMAL HIGH (ref 1.005–1.030)
WBC, UA: >50 WBC/hpf (ref 0–5)
pH: 5 (ref 5.0–8.0)

## 2024-06-07 LAB — TYPE AND SCREEN
ABO/RH(D): A POS
Antibody Screen: NEGATIVE

## 2024-06-07 LAB — I-STAT CG4 LACTIC ACID, ED: Lactic Acid, Venous: 1.7 mmol/L (ref 0.5–1.9)

## 2024-06-07 LAB — TROPONIN I (HIGH SENSITIVITY): Troponin I (High Sensitivity): 6 ng/L (ref <18)

## 2024-06-07 LAB — LIPASE, BLOOD: Lipase: 11 U/L (ref 11–51)

## 2024-06-07 MED ORDER — ROSUVASTATIN CALCIUM 5 MG PO TABS
10.0000 mg | ORAL_TABLET | Freq: Every day | ORAL | Status: DC
  Administered 2024-06-08 – 2024-06-09 (×2): 10 mg via ORAL
  Filled 2024-06-07 (×2): qty 2

## 2024-06-07 MED ORDER — LACTATED RINGERS IV BOLUS
1000.0000 mL | Freq: Once | INTRAVENOUS | Status: AC
  Administered 2024-06-07: 1000 mL via INTRAVENOUS

## 2024-06-07 MED ORDER — LACTATED RINGERS IV SOLN: INTRAVENOUS | Status: DC

## 2024-06-07 MED ORDER — ONDANSETRON HCL 4 MG/2ML IJ SOLN: 4.0000 mg | Freq: Four times a day (QID) | INTRAMUSCULAR | Status: DC | PRN

## 2024-06-07 MED ORDER — ACETAMINOPHEN 650 MG RE SUPP: 650.0000 mg | Freq: Four times a day (QID) | RECTAL | Status: DC | PRN

## 2024-06-07 MED ORDER — MORPHINE SULFATE (PF) 4 MG/ML IV SOLN
4.0000 mg | Freq: Once | INTRAVENOUS | Status: AC
  Administered 2024-06-07: 4 mg via INTRAVENOUS
  Filled 2024-06-07: qty 1

## 2024-06-07 MED ORDER — SODIUM CHLORIDE 0.9% FLUSH
3.0000 mL | Freq: Two times a day (BID) | INTRAVENOUS | Status: DC
  Administered 2024-06-08 – 2024-06-09 (×3): 3 mL via INTRAVENOUS

## 2024-06-07 MED ORDER — IOHEXOL 350 MG/ML SOLN
75.0000 mL | Freq: Once | INTRAVENOUS | Status: AC | PRN
Start: 2024-06-07 — End: 2024-06-07
  Administered 2024-06-07: 75 mL via INTRAVENOUS

## 2024-06-07 MED ORDER — HYDROCORTISONE SOD SUC (PF) 100 MG IJ SOLR
100.0000 mg | Freq: Once | INTRAMUSCULAR | Status: AC
  Administered 2024-06-07: 100 mg via INTRAVENOUS
  Filled 2024-06-07: qty 2

## 2024-06-07 MED ORDER — SENNOSIDES-DOCUSATE SODIUM 8.6-50 MG PO TABS: 1.0000 | ORAL_TABLET | Freq: Every evening | ORAL | Status: DC | PRN

## 2024-06-07 MED ORDER — PANTOPRAZOLE SODIUM 40 MG IV SOLR
40.0000 mg | Freq: Once | INTRAVENOUS | Status: AC
  Administered 2024-06-07: 40 mg via INTRAVENOUS
  Filled 2024-06-07: qty 10

## 2024-06-07 MED ORDER — PANTOPRAZOLE SODIUM 40 MG IV SOLR
40.0000 mg | Freq: Two times a day (BID) | INTRAVENOUS | Status: DC
  Administered 2024-06-07 – 2024-06-09 (×4): 40 mg via INTRAVENOUS
  Filled 2024-06-07 (×4): qty 10

## 2024-06-07 MED ORDER — SODIUM CHLORIDE 0.9 % IV BOLUS
1000.0000 mL | Freq: Once | INTRAVENOUS | Status: AC
  Administered 2024-06-07: 1000 mL via INTRAVENOUS

## 2024-06-07 MED ORDER — ONDANSETRON HCL 4 MG PO TABS: 4.0000 mg | ORAL_TABLET | Freq: Four times a day (QID) | ORAL | Status: DC | PRN

## 2024-06-07 MED ORDER — ONDANSETRON HCL 4 MG/2ML IJ SOLN
4.0000 mg | Freq: Once | INTRAMUSCULAR | Status: AC
  Administered 2024-06-07: 4 mg via INTRAVENOUS
  Filled 2024-06-07: qty 2

## 2024-06-07 MED ORDER — HYDROCORTISONE SOD SUC (PF) 100 MG IJ SOLR
50.0000 mg | Freq: Four times a day (QID) | INTRAMUSCULAR | Status: DC
  Administered 2024-06-08 (×3): 50 mg via INTRAVENOUS
  Filled 2024-06-07 (×5): qty 1

## 2024-06-07 MED ORDER — BISACODYL 5 MG PO TBEC: 5.0000 mg | DELAYED_RELEASE_TABLET | Freq: Every day | ORAL | Status: DC | PRN

## 2024-06-07 MED ORDER — ACETAMINOPHEN 325 MG PO TABS
650.0000 mg | ORAL_TABLET | Freq: Four times a day (QID) | ORAL | Status: DC | PRN
  Administered 2024-06-08: 650 mg via ORAL
  Filled 2024-06-07: qty 2

## 2024-06-07 MED ORDER — LEVOTHYROXINE SODIUM 75 MCG PO TABS
75.0000 ug | ORAL_TABLET | Freq: Every day | ORAL | Status: DC
  Administered 2024-06-08 – 2024-06-09 (×2): 75 ug via ORAL
  Filled 2024-06-07 (×2): qty 1

## 2024-06-07 NOTE — ED Notes (Signed)
 Patient transported to CT

## 2024-06-07 NOTE — ED Triage Notes (Signed)
 Patient from home via EMS for eval of n/v and generalized burning abdominal pain x 2 days. Emesis x 4 in total. Hx GERD and peptic ulcers and states this pain feels the same as previous flare ups.

## 2024-06-07 NOTE — Hospital Course (Signed)
 [name] is a 67 y.o. female with medical history significant for adrenal insufficiency on chronic hydrocortisone , remote history of atrial myxoma s/p resection, history of embolic stroke with left eye blindness, HLD, hypothyroidism, chronic migraines, PUD, symptomatic NSVT/PVCs, depression who is admitted with volume depletion secondary to abdominal pain with nausea vomiting.

## 2024-06-07 NOTE — ED Provider Notes (Signed)
 [place] EMERGENCY DEPARTMENT AT [facility] Provider Note   CSN: [id number] Arrival date & time: 06/07/24  1345     Patient presents with: Emesis   [name] is a 68 y.o. female.      Prior to Admission medications   Medication Sig Start Date End Date Taking? Authorizing Provider  aluminum -magnesium  hydroxide-simethicone  (MAALOX) 200-200-20 MG/5ML SUSP Take 30 mLs by mouth 4 (four) times daily -  before meals and at bedtime. Patient not taking: Reported on 02/27/2024 11/05/23   Oley Bascom RAMAN, NP  aspirin  EC 81 MG tablet Take 81 mg by mouth daily. Swallow whole. Patient not taking: Reported on 02/27/2024    [provider]  fenofibrate  (TRICOR ) 48 MG tablet Take 1 tablet (48 mg total) by mouth daily. 05/21/24   Oley Bascom RAMAN, NP  Galcanezumab -gnlm (EMGALITY ) 120 MG/ML SOAJ Inject 120 mg into the skin every 30 (thirty) days. 10/25/23   Whitfield Raisin, NP  hydrocortisone  (CORTEF ) 10 MG tablet take 15 MG (1&1/2 tablets) with Breakfast AND 10 MG (ONE tablet) between 2-4 as needed daily] 08/17/23   Shamleffer, Ibtehal Jaralla, MD  hydrOXYzine  (ATARAX ) 10 MG tablet TAKE ONE TABLET BY MOUTH AT BEDTIME AS NEEDED 01/23/24   Nichols, Tonya S, NP  levothyroxine  (SYNTHROID ) 75 MCG tablet Take 1 tablet (75 mcg total) by mouth daily before breakfast. 02/06/24   Nichols, Tonya S, NP  nitroGLYCERIN  (NITROSTAT ) 0.4 MG SL tablet Place 1 tablet (0.4 mg total) under the tongue every 5 (five) minutes x 3 doses as needed for chest pain. 03/24/24   Oley Bascom RAMAN, NP  omega-3 acid ethyl esters (LOVAZA ) 1 g capsule TAKE ONE CAPSULE BY MOUTH TWICE DAILY 03/24/24   Nichols, Tonya S, NP  omeprazole  (PRILOSEC) 20 MG capsule Take 1 capsule (20 mg total) by mouth daily. 06/06/24   Oley Bascom RAMAN, NP  ondansetron  (ZOFRAN -ODT) 4 MG disintegrating tablet Take 1 tablet (4 mg total) by mouth every 8 (eight) hours as needed for nausea or vomiting. 02/15/23   Zelaya, Oscar A, PA-C  Rimegepant  Sulfate (NURTEC) 75 MG TBDP Take 1 tablet (75 mg total) by mouth daily as needed. For migraines. Take as close to onset of migraine as possible. One daily maximum. 10/25/23   Whitfield Raisin, NP  rosuvastatin  (CRESTOR ) 10 MG tablet Take 1 tablet (10 mg total) by mouth daily. 02/06/24   Oley Bascom RAMAN, NP  venlafaxine  XR (EFFEXOR -XR) 150 MG 24 hr capsule Take 1 capsule (150 mg total) by mouth daily. 02/06/24 02/05/25  Oley Bascom RAMAN, NP  Vitamin D , Ergocalciferol , (DRISDOL ) 1.25 MG (50000 UNIT) CAPS capsule TAKE ONE CAPSULE BY MOUTH EVERY SEVEN DAYS 03/16/24   Nichols, Tonya S, NP    Allergies: Aspirin , Ibuprofen [ibuprofen], and Penicillins    Review of Systems  Gastrointestinal:  Positive for vomiting.    Updated Vital Signs BP 108/75   Pulse (!) 118   Temp 98.5 F (36.9 C) (Oral)   Resp 17   Ht 4' 11 (1.499 m)   Wt 68 kg   SpO2 100%   BMI 30.30 kg/m   Physical Exam  (all labs ordered are listed, but only abnormal results are displayed) Labs Reviewed  COMPREHENSIVE METABOLIC PANEL WITH GFR - Abnormal; Notable for the following components:      Result Value   Sodium 131 (*)    CO2 19 (*)    Creatinine, Ser 1.16 (*)    Calcium  11.9 (*)    Total Bilirubin 1.3 (*)  GFR, Estimated 52 (*)    All other components within normal limits  CBC - Abnormal; Notable for the following components:   Hemoglobin 15.1 (*)    All other components within normal limits  URINALYSIS, ROUTINE W REFLEX MICROSCOPIC - Abnormal; Notable for the following components:   APPearance CLOUDY (*)    Specific Gravity, Urine >1.046 (*)    Hgb urine dipstick SMALL (*)    Protein, ur 100 (*)    Leukocytes,Ua MODERATE (*)    Bacteria, UA RARE (*)    Non Squamous Epithelial 0-5 (*)    All other components within normal limits  LIPASE, BLOOD  HIV ANTIBODY (ROUTINE TESTING W REFLEX)  BASIC METABOLIC PANEL WITH GFR  CBC  I-STAT CG4 LACTIC ACID, ED  TYPE AND SCREEN  TROPONIN I (HIGH SENSITIVITY)     EKG: EKG Interpretation Date/Time:  Saturday June 07 2024 14:23:53 EDT Ventricular Rate:  126 PR Interval:  135 QRS Duration:  95 QT Interval:  345 QTC Calculation: 500 R Axis:   77  Text Interpretation: Sinus tachycardia Multiple ventricular premature complexes Abnormal T, consider ischemia, diffuse leads , new since last tracing, Since last tracing rate faster Confirmed by Randol Simmonds [phone]) on 06/07/2024 2:32:38 PM  Radiology: CT ABDOMEN PELVIS W CONTRAST Result Date: 06/07/2024 CLINICAL DATA:  Abdominal pain.  Acute nonlocalized EXAM: CT ABDOMEN AND PELVIS WITH CONTRAST TECHNIQUE: Multidetector CT imaging of the abdomen and pelvis was performed using the standard protocol following bolus administration of intravenous contrast. RADIATION DOSE REDUCTION: This exam was performed according to the departmental dose-optimization program which includes automated exposure control, adjustment of the mA and/or kV according to patient size and/or use of iterative reconstruction technique. CONTRAST:  75mL OMNIPAQUE  IOHEXOL  350 MG/ML SOLN COMPARISON:  CT 12/04/2022 FINDINGS: Lower chest: Mild linear interstitial thickening and atelectasis at the lung bases. No evidence of acute infection. Hepatobiliary: Several small benign hepatic cysts unchanged. Gallbladder normal. No biliary duct dilatation. Pancreas: Pancreas is normal. No ductal dilatation. No pancreatic inflammation. Spleen: Normal spleen Adrenals/urinary tract: Surgical clips at the level in LEFT and RIGHT adrenal gland. Adrenal glands not well appreciated. No hydronephrosis. Bilateral low-attenuation cysts are not changed from comparison CT 2024. Stomach/Bowel: Stomach, small bowel, appendix, and cecum are normal. Multiple diverticula of the descending colon and sigmoid colon without acute inflammation. Vascular/Lymphatic: Abdominal aorta is normal caliber. No periportal or retroperitoneal adenopathy. No pelvic adenopathy. Reproductive: Post  hysterectomy.  Adnexa unremarkable Other: No free fluid. Musculoskeletal: Multiple sclerotic lesions throughout the pelvis and spine. Lesion increased in number and conspicuity in the thoracic lumbar spine and sacrum. Example lesion at T11 measures 15 mm image 18/3. Lesion at L1 measures 8 mm. Multiple small lesions throughout the sacrum. IMPRESSION: 1. No acute findings in the abdomen pelvis. 2. Normal gallbladder and appendix. 3. Multiple sclerotic lesions within the pelvis and spine. Lesions have increased in number from CT 12/24/2021. Unclear etiology. Differential include medical renal disease, primary bone disease, or metastatic disease. Consider MRI of the lumbar spine for further evaluation. Electronically Signed   By: Jackquline Boxer M.D.   On: 06/07/2024 17:32   DG Chest Portable 1 View Result Date: 06/07/2024 CLINICAL DATA:  Chest pain.  Nausea, vomiting, and abdominal pain. EXAM: PORTABLE CHEST 1 VIEW COMPARISON:  09/10/2023. FINDINGS: The heart is borderline enlarged and mediastinal contours are within normal limits. Sternal wires and epicardial pacer wires are noted over the midline. Multiple no consolidation, effusion, or pneumothorax is seen. No acute osseous abnormality.  Surgical clips are noted in the upper abdomen. IMPRESSION: No active disease. Electronically Signed   By: Leita Birmingham M.D.   On: 06/07/2024 15:07     Procedures   Medications Ordered in the ED  sodium chloride  flush (NS) 0.9 % injection 3 mL (has no administration in time range)  lactated ringers infusion ( Intravenous New Bag/Given 06/07/24 2128)  acetaminophen  (TYLENOL ) tablet 650 mg (has no administration in time range)    Or  acetaminophen  (TYLENOL ) suppository 650 mg (has no administration in time range)  ondansetron  (ZOFRAN ) tablet 4 mg (has no administration in time range)    Or  ondansetron  (ZOFRAN ) injection 4 mg (has no administration in time range)  senna-docusate (Senokot-S) tablet 1 tablet (has no  administration in time range)  bisacodyl (DULCOLAX) EC tablet 5 mg (has no administration in time range)  hydrocortisone  sodium succinate  (SOLU-CORTEF ) 100 MG injection 100 mg (100 mg Intravenous Given 06/07/24 2121)    Followed by  hydrocortisone  sodium succinate  (SOLU-CORTEF ) 100 MG injection 50 mg (has no administration in time range)  pantoprazole  (PROTONIX ) injection 40 mg (40 mg Intravenous Given 06/07/24 2121)  levothyroxine  (SYNTHROID ) tablet 75 mcg (has no administration in time range)  rosuvastatin  (CRESTOR ) tablet 10 mg (has no administration in time range)  ondansetron  (ZOFRAN ) injection 4 mg (4 mg Intravenous Given 06/07/24 1427)  sodium chloride  0.9 % bolus 1,000 mL (0 mLs Intravenous Stopped 06/07/24 1600)  morphine  (PF) 4 MG/ML injection 4 mg (4 mg Intravenous Given 06/07/24 1427)  pantoprazole  (PROTONIX ) injection 40 mg (40 mg Intravenous Given 06/07/24 1427)  iohexol  (OMNIPAQUE ) 350 MG/ML injection 75 mL (75 mLs Intravenous Contrast Given 06/07/24 1656)  lactated ringers bolus 1,000 mL (0 mLs Intravenous Stopped 06/07/24 2008)  morphine  (PF) 4 MG/ML injection 4 mg (4 mg Intravenous Given 06/07/24 1924)    Clinical Course as of 06/07/24 2223  Sat Jun 07, 2024  1501 Hx Cushing. Bleeding gastric ulcers. Abdominal pain x2 with pink tinged emesis. Epigastric tenderness. Received 1L IVF.   [ ]  CT AP [WB]  1745 1 L LR [WB]  1911 Patient reassessed reporting ongoing epigastric pain.  Given 4 mg IV morphine . [WB]    Clinical Course User Index [WB] Roc Streett, Elsie, MD                                 Medical Decision Making Amount and/or Complexity of Data Reviewed Labs: ordered. Radiology: ordered.  Risk Prescription drug management. Decision regarding hospitalization.   Patient handed off from previous provider, see initial APP note for full HPI.  On reassessment, patient resting comfortably with no acute somatic complaints.  CMP with mild hyponatremia and elevated total  bilirubin.  Creatinine elevated from baseline, however subdiagnostic for AKI.  CT reviewed without evidence of acute intra-abdominal pathology.  Normal gallbladder and appendix.  Sclerotic lesions in the pelvis and spine noted, increased in number from CT 12/24/2021.  Patient updated regarding all findings and encouraged to follow-up with PCP for outpatient workup of incidental bony lesions.  Lipase WNL.  In the setting of dry oral mucosa and concern for mild clinical dehydration, patient administered second liter of IV crystalloid.  Upon reassessment, patient with ongoing tachycardia and discomfort requiring repeat dosing of IV morphine .  Hospitalist service engaged for admission in setting of ongoing pain control.  Final diagnoses:  Epigastric pain    ED Discharge Orders     None  Dorcus Fallow, MD 06/07/24 2223    Simon Lavonia SAILOR, MD 06/07/24 [phone]

## 2024-06-07 NOTE — H&P (Signed)
 History and Physical    [name] FMW:[id number] DOB: 1956/05/24 DOA: 06/07/2024  PCP: Oley Bascom RAMAN, NP  Patient coming from: Home  I have personally briefly reviewed patient's old medical records in [facility]  Chief Complaint: Abdominal pain, nausea, vomiting  HPI: [name] is a 68 y.o. female with medical history significant for adrenal insufficiency on chronic hydrocortisone , remote history of atrial myxoma s/p resection, history of embolic stroke with left eye blindness, HLD, hypothyroidism, chronic migraines, PUD, symptomatic NSVT/PVCs, depression who presented to the ED for evaluation of nausea, vomiting, abdominal pain.  History is limited from patient as she is somnolent after receiving IV morphine  and is otherwise supplemented by EDP, chart review, and son at bedside.  Patient's son states that she began developing nausea and vomiting yesterday.  Today symptoms persisted and she developed new upper abdominal pain.  He notes that she has been out of her hydrocortisone  and omeprazole  for at least 2 days.  She last saw her endocrinologist in November 2023 but has not followed up since then.  Patient herself is very somnolent and difficult to arouse, grimaces and withdraws extremities with noxious stimuli.  ED Course  Labs/Imaging on admission: I have personally reviewed following labs and imaging studies.  Initial vitals showed BP 97/73, pulse 124, RR 17, temp 99.2 F, SpO2 95% on room air.  Labs showed WBC 7.8, hemoglobin 15.1, platelets 360, sodium 131, potassium 4.2, bicarb 19, BUN 22, creatinine 1.16, serum glucose 91, lipase 11, AST 36, ALT 20, alk phos 57, total bilirubin 1.3, troponin 6, lactic acid 1.7.  CT abdomen/pelvis with contrast negative for acute findings.  Normal gallbladder and appendix.  Multiple sclerotic lesions within the pelvis and spine, increase in number from CT 12/24/2021.  Patient was given 2 L IV fluids, IV Protonix  40 mg, IV  morphine  4 mg x 2, IV Zofran .  The hospitalist service was consulted for admission.  Review of Systems: All systems reviewed and are negative except as documented in history of present illness above.   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   Asthma    Atrial myxoma    Blindness of left eye 1978   Blood transfusion    Breast cyst    Constipation    Cushing's syndrome    CVA (cerebral infarction)    Depression    Depression    GERD (gastroesophageal reflux disease)    Glucocorticoid deficiency    Hemorrhoids    History of open heart surgery 1977   Hyperlipemia    Hypothyroidism    per office note dated 03/25/2014   Hypoxia    Migraine    Peptic ulcer associated with Helicobacter pylori infection    Reflux    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; leaving me blind    Past Surgical History:  Procedure Laterality Date   ADRENAL GLAND SURGERY  09/11/1980   on hydrocortisone    ATRIAL MYXOMA EXCISION  09/11/1976   BREAST BIOPSY  07/13/1999   ductal tissue excision & bx right; bx only left breast   COLONOSCOPY     EYE SURGERY     LEFT HEART CATHETERIZATION WITH CORONARY ANGIOGRAM N/A 10/06/2011   Procedure: LEFT HEART CATHETERIZATION WITH CORONARY ANGIOGRAM;  Surgeon: Rober LOISE Chroman, MD;  Location: [facility]  CATH LAB;  Service: Cardiovascular;  Laterality: N/A;   ORBITOTOMY Right 07/27/2021   POLYPECTOMY     repaired cross eyed     as a child   ROTATOR CUFF REPAIR  05/12/2005   left   VAGINAL HYSTERECTOMY  09/11/1998   partial; w/left ovary    Social History: Social History   Tobacco Use   Smoking status: Never   Smokeless tobacco: Never  Vaping Use   Vaping status: Never Used  Substance Use Topics   Alcohol use: No   Drug use: No    Allergies  Allergen Reactions   Aspirin  Other (See Comments)    Can take baby aspirin  (heart flutters with  high doses)   Ibuprofen [Ibuprofen] Nausea And Vomiting   Penicillins Nausea And Vomiting    Has patient had a PCN reaction causing immediate rash, facial/tongue/throat swelling, SOB or lightheadedness with hypotension: Yes Has patient had a PCN reaction causing severe rash involving mucus membranes or skin necrosis: No Has patient had a PCN reaction that required hospitalization No Has patient had a PCN reaction occurring within the last 10 years: No If all of the above answers are NO, then may proceed with Cephalosporin use.     Family History  Problem Relation Age of Onset   Acromegaly Father    Heart disease Father        Tumor in the heart   Stomach cancer Father    Colon cancer Sister 14   Diabetes Sister    Kidney disease Sister        x2   Heart disease Sister        Tumor in the heart   Heart disease Daughter        Tumor in the heart   Colon polyps Neg Hx    Esophageal cancer Neg Hx    Gallbladder disease Neg Hx    Migraines Neg Hx    Rectal cancer Neg Hx      Prior to Admission medications   Medication Sig Start Date End Date Taking? Authorizing Provider  aluminum -magnesium  hydroxide-simethicone  (MAALOX) 200-200-20 MG/5ML SUSP Take 30 mLs by mouth 4 (four) times daily -  before meals and at bedtime. Patient not taking: Reported on 02/27/2024 11/05/23   Oley Bascom RAMAN, NP  aspirin  EC 81 MG tablet Take 81 mg by mouth daily. Swallow whole. Patient not taking: Reported on 02/27/2024    [provider]  fenofibrate  (TRICOR ) 48 MG tablet Take 1 tablet (48 mg total) by mouth daily. 05/21/24   Oley Bascom RAMAN, NP  Galcanezumab -gnlm (EMGALITY ) 120 MG/ML SOAJ Inject 120 mg into the skin every 30 (thirty) days. 10/25/23   Whitfield Raisin, NP  hydrocortisone  (CORTEF ) 10 MG tablet take 15 MG (1&1/2 tablets) with Breakfast AND 10 MG (ONE tablet) between 2-4 as needed daily] 08/17/23   Shamleffer, Ibtehal Jaralla, MD  hydrOXYzine  (ATARAX ) 10 MG tablet TAKE ONE TABLET BY  MOUTH AT BEDTIME AS NEEDED 01/23/24   Nichols, Tonya S, NP  levothyroxine  (SYNTHROID ) 75 MCG tablet Take 1 tablet (75 mcg total) by mouth daily before breakfast. 02/06/24   Nichols, Tonya S, NP  nitroGLYCERIN  (NITROSTAT ) 0.4 MG SL tablet Place 1 tablet (0.4 mg total) under the tongue every 5 (five) minutes x 3 doses as needed for chest pain. 03/24/24   Oley Bascom RAMAN, NP  omega-3 acid ethyl esters (LOVAZA ) 1 g capsule TAKE ONE CAPSULE BY MOUTH TWICE DAILY 03/24/24   Nichols, Tonya S, NP  omeprazole  (PRILOSEC) 20  MG capsule Take 1 capsule (20 mg total) by mouth daily. 06/06/24   Oley Bascom RAMAN, NP  ondansetron  (ZOFRAN -ODT) 4 MG disintegrating tablet Take 1 tablet (4 mg total) by mouth every 8 (eight) hours as needed for nausea or vomiting. 02/15/23   Zelaya, Oscar A, PA-C  Rimegepant Sulfate (NURTEC) 75 MG TBDP Take 1 tablet (75 mg total) by mouth daily as needed. For migraines. Take as close to onset of migraine as possible. One daily maximum. 10/25/23   Whitfield Raisin, NP  rosuvastatin  (CRESTOR ) 10 MG tablet Take 1 tablet (10 mg total) by mouth daily. 02/06/24   Oley Bascom RAMAN, NP  venlafaxine  XR (EFFEXOR -XR) 150 MG 24 hr capsule Take 1 capsule (150 mg total) by mouth daily. 02/06/24 02/05/25  Oley Bascom RAMAN, NP  Vitamin D , Ergocalciferol , (DRISDOL ) 1.25 MG (50000 UNIT) CAPS capsule TAKE ONE CAPSULE BY MOUTH EVERY SEVEN DAYS 03/16/24   Oley Bascom RAMAN, NP    Physical Exam: Vitals:   06/07/24 1815 06/07/24 1830 06/07/24 2000 06/07/24 2030  BP: 103/60 99/68 107/70 108/75  Pulse: (!) 115  (!) 119 (!) 118  Resp: 15 15 13 17   Temp:      TempSrc:      SpO2:   100% 100%  Weight:      Height:       \Constitutional: Very somnolent, difficult to arouse Eyes:  lids and conjunctivae normal ENMT: Mucous membranes are dry. Posterior pharynx clear of any exudate or lesions.Normal dentition.  Neck: normal, supple, no masses. Respiratory: clear to auscultation anteriorly. Normal respiratory effort. No  accessory muscle use.  Cardiovascular: Mild tachycardia. No extremity edema. 2+ pedal pulses. Abdomen: no obvious tenderness elicited with palpation, no masses palpated. Musculoskeletal: no clubbing / cyanosis. No joint deformity upper and lower extremities Skin: no rashes, lesions, ulcers. No induration Neurologic: Exam limited due to hypersomnolence.  Grimaces and withdraws extremities to noxious stimuli Psychiatric: Somnolent  EKG: Personally reviewed. Sinus tachycardia, rate 126, PVCs.  Rate is faster when compared to previous.  Assessment/Plan Principal Problem:   Abdominal pain Active Problems:   Adrenal insufficiency   Hyponatremia   Hypothyroidism   [name] is a 68 y.o. female with medical history significant for adrenal insufficiency on chronic hydrocortisone , remote history of atrial myxoma s/p resection, history of embolic stroke with left eye blindness, HLD, hypothyroidism, chronic migraines, PUD, symptomatic NSVT/PVCs, depression who is admitted with volume depletion secondary to abdominal pain with nausea vomiting.  Assessment and Plan: Nausea/vomiting/abdominal pain: Persistent symptoms for 2 days.  Patient reportedly has been out of her hydrocortisone  and PPI during same period of time.  Symptoms could be due to adrenal insufficiency at risk of adrenal crisis.  She does also have remote history of PUD and H. pylori which is also in differential as well as gastritis.  CT A/P was unrevealing for acute pathology. - Start stress dose steroids with IV hydrocortisone  100 mg once followed by 50 mg every 6 hours - Continue IV fluid hydration overnight - Continue IV Protonix  40 mg twice daily  Adrenal insufficiency: S/p bilateral adrenalectomy in 1982.  Has been on chronic hydrocortisone  15 mg a.m. and 10 mg 2-4 p.m. Reportedly ran out of her medications about 2 days prior to this admission.  Has borderline low blood pressure and is high risk for developing adrenal  crisis.  Started on stress dose steroids as above.  Mild hyponatremia: This could be from volume depletion and/or adrenal insufficiency.  Continue management as above.  Hypothyroidism: Continue Synthroid .  Hyperlipidemia: Continue rosuvastatin .  Sclerotic lesions within pelvis and spine: CT showed multiple sclerotic lesions within the pelvis and spine, increased in number from prior CT in April 2023.  Differential includes medical renal disease, primary bone disease, or metastatic disease.  Consider nonemergent follow-up MRI of the lumbar spine.   DVT prophylaxis: SCDs Start: 06/07/24 2045 Code Status: Full code Family Communication: Son at bedside Disposition Plan: From home and likely return to home pending clinical progress Consults called: None Severity of Illness: The appropriate patient status for this patient is OBSERVATION. Observation status is judged to be reasonable and necessary in order to provide the required intensity of service to ensure the patient's safety. The patient's presenting symptoms, physical exam findings, and initial radiographic and laboratory data in the context of their medical condition is felt to place them at decreased risk for further clinical deterioration. Furthermore, it is anticipated that the patient will be medically stable for discharge from the hospital within 2 midnights of admission.   Jorie Blanch MD [facility]  If 7PM-7AM, please contact night-coverage [email or web address]  06/07/2024, 9:12 PM

## 2024-06-07 NOTE — ED Notes (Signed)
 CCMD called.

## 2024-06-07 NOTE — ED Notes (Signed)
 Pt placed on 2L of O2.

## 2024-06-07 NOTE — ED Provider Notes (Signed)
 [place] [facility]ERGENCY DEPARTMENT AT [facility] Provider Note   CSN: [id number] Arrival date & time: 06/07/24  1345     Patient presents with: Emesis   [name] is a 68 y.o. 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  She reports 4 episodes of pink-tinged emesis.  She reports that she has had bleeding ulcers in the past secondary to her chronic steroid use.  She denies any alcohol use.  She rates the pain 10/10.  She denies any dark stool.  She denies any dysuria, constipation.    Emesis      Prior to Admission medications   Medication Sig Start Date End Date Taking? Authorizing Provider  aluminum -magnesium  hydroxide-simethicone  (MAALOX) 200-200-20 MG/5ML SUSP Take 30 mLs by mouth 4 (four) times daily -  before meals and at bedtime. Patient not taking: Reported on 02/27/2024 11/05/23   Oley Bascom RAMAN, NP  aspirin  EC 81 MG tablet Take 81 mg by mouth daily. Swallow whole. Patient not taking: Reported on 02/27/2024    [provider]  fenofibrate  (TRICOR ) 48 MG tablet Take 1 tablet (48 mg total) by mouth daily. 05/21/24   Oley Bascom RAMAN, NP  Galcanezumab -gnlm (EMGALITY ) 120 MG/ML SOAJ Inject 120 mg into the skin every 30 (thirty) days. 10/25/23   Whitfield Raisin, NP  hydrocortisone  (CORTEF ) 10 MG tablet take 15 MG (1&1/2 tablets) with Breakfast AND 10 MG (ONE tablet) between 2-4 as needed daily] 08/17/23   Shamleffer, Ibtehal Jaralla, MD  hydrOXYzine  (ATARAX ) 10 MG tablet TAKE ONE TABLET BY MOUTH AT BEDTIME AS NEEDED 01/23/24   Nichols, Tonya S, NP  levothyroxine  (SYNTHROID ) 75 MCG tablet Take 1 tablet (75 mcg total) by mouth daily before breakfast. 02/06/24   Nichols, Tonya S, NP  nitroGLYCERIN  (NITROSTAT ) 0.4 MG SL tablet Place 1 tablet (0.4 mg total) under the tongue every 5 (five) minutes x  3 doses as needed for chest pain. 03/24/24   Oley Bascom RAMAN, NP  omega-3 acid ethyl esters (LOVAZA ) 1 g capsule TAKE ONE CAPSULE BY MOUTH TWICE DAILY 03/24/24   Nichols, Tonya S, NP  omeprazole  (PRILOSEC) 20 MG capsule Take 1 capsule (20 mg total) by mouth daily. 06/06/24   Oley Bascom RAMAN, NP  ondansetron  (ZOFRAN -ODT) 4 MG disintegrating tablet Take 1 tablet (4 mg total) by mouth every 8 (eight) hours as needed for nausea or vomiting. 02/15/23   Zelaya, Oscar A, PA-C  Rimegepant Sulfate (NURTEC) 75 MG TBDP Take 1 tablet (75 mg total) by mouth daily as needed. For migraines. Take as close to onset of migraine as possible. One daily maximum. 10/25/23   Whitfield Raisin, NP  rosuvastatin  (CRESTOR ) 10 MG tablet Take 1 tablet (10 mg total) by mouth daily. 02/06/24   Oley Bascom RAMAN, NP  venlafaxine  XR (EFFEXOR -XR) 150 MG 24 hr capsule Take 1 capsule (150 mg total) by mouth daily. 02/06/24 02/05/25  Oley Bascom RAMAN, NP  Vitamin D , Ergocalciferol , (DRISDOL ) 1.25 MG (50000 UNIT) CAPS capsule TAKE ONE CAPSULE BY MOUTH EVERY SEVEN DAYS 03/16/24   Nichols, Tonya S, NP    Allergies: Aspirin , Ibuprofen [ibuprofen], and Penicillins    Review of Systems  Gastrointestinal:  Positive for vomiting.  All other systems reviewed and are negative.   Updated Vital Signs BP 97/73   Pulse (!) 123   Temp 99.2 F (37.3 C) (Oral)   Resp  17   Ht 4' 11 (1.499 m)   Wt 68 kg   SpO2 95%   BMI 30.30 kg/m   Physical Exam Vitals and nursing note reviewed.  Constitutional:      General: She is not in acute distress.    Appearance: Normal appearance.  HENT:     Head: Normocephalic and atraumatic.  Eyes:     General:        Right eye: No discharge.        Left eye: No discharge.  Cardiovascular:     Rate and Rhythm: Normal rate and regular rhythm.     Heart sounds: No murmur heard.    No friction rub. No gallop.  Pulmonary:     Effort: Pulmonary effort is normal.     Breath sounds: Normal breath sounds.   Abdominal:     General: Bowel sounds are normal.     Palpations: Abdomen is soft.     Comments: Focal ttp in epigastric region, no rebound, rigidity, some guarding  Skin:    General: Skin is warm and dry.     Capillary Refill: Capillary refill takes less than 2 seconds.  Neurological:     Mental Status: She is alert and oriented to person, place, and time.  Psychiatric:        Mood and Affect: Mood normal.        Behavior: Behavior normal.     (all labs ordered are listed, but only abnormal results are displayed) Labs Reviewed  COMPREHENSIVE METABOLIC PANEL WITH GFR - Abnormal; Notable for the following components:      Result Value   Sodium 131 (*)    CO2 19 (*)    Creatinine, Ser 1.16 (*)    Calcium  11.9 (*)    Total Bilirubin 1.3 (*)    GFR, Estimated 52 (*)    All other components within normal limits  CBC - Abnormal; Notable for the following components:   Hemoglobin 15.1 (*)    All other components within normal limits  LIPASE, BLOOD  URINALYSIS, ROUTINE W REFLEX MICROSCOPIC  I-STAT CG4 LACTIC ACID, ED  TYPE AND SCREEN  TROPONIN I (HIGH SENSITIVITY)    EKG: EKG Interpretation Date/Time:  Saturday June 07 2024 14:23:53 EDT Ventricular Rate:  126 PR Interval:  135 QRS Duration:  95 QT Interval:  345 QTC Calculation: 500 R Axis:   77  Text Interpretation: Sinus tachycardia Multiple ventricular premature complexes Abnormal T, consider ischemia, diffuse leads , new since last tracing, Since last tracing rate faster Confirmed by Randol Simmonds [phone]) on 06/07/2024 2:32:38 PM  Radiology: No results found.   Procedures   Medications Ordered in the ED  ondansetron  (ZOFRAN ) injection 4 mg (4 mg Intravenous Given 06/07/24 1427)  sodium chloride  0.9 % bolus 1,000 mL (1,000 mLs Intravenous New Bag/Given 06/07/24 1425)  morphine  (PF) 4 MG/ML injection 4 mg (4 mg Intravenous Given 06/07/24 1427)  pantoprazole  (PROTONIX ) injection 40 mg (40 mg Intravenous Given 06/07/24  1427)    Clinical Course as of 06/07/24 1503  Sat Jun 07, 2024  1501 Hx Cushing. Bleeding gastric ulcers. Abdominal pain x2 with pink tinged emesis. Epigastric tenderness.  [WB]    Clinical Course User Index [WB] Beam, Elsie, MD                                 Medical Decision Making  This patient is a 68 y.o. female  who presents to the ED for concern of abdominal pain, nausea, vomiting, some radiation into chest.   Differential diagnoses prior to evaluation: The emergent differential diagnosis includes, but is not limited to,  The causes of generalized abdominal pain include but are not limited to AAA, mesenteric ischemia, appendicitis, diverticulitis, DKA, gastritis, gastroenteritis, AMI, nephrolithiasis, pancreatitis, peritonitis, adrenal insufficiency,lead poisoning, iron toxicity, intestinal ischemia, constipation, UTI,SBO/LBO, splenic rupture, biliary disease, IBD, IBS, PUD, or hepatitis . This is not an exhaustive differential.   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 Pertinent results include: reviewed labwork, imaging from previous ED visits, outpatient cardiology visits, endoscopy previously with [facility]  Physical Exam: Physical exam performed. The pertinent findings include: focal ttp in epigastric region, borderline temp, 99.2, tachycardia with normal rhythm, pulse 125  Lab Tests/Imaging studies: I personally interpreted labs/imaging and the pertinent results include:  CBC unremarkable, notable with normal to slightly elevated HGB, 15.1. Normal lactic acid.  Initial troponin normal at 6.  CMP with mild hyponatremia, sodium 131, consistent with her known history of Cushing's, mild bicarb deficit, CO2 19, slightly elevated creatinine at 1.16, suspect secondary to dehydration plain film chest x-ray with no evidence of acute intrathoracic abnormality,  no evidence of perforation. Imaging is pending at time of handoff. I agree with the radiologist interpretation.  Cardiac monitoring: EKG obtained and interpreted by myself and attending physician which shows: Sinus tachycardia, nonspecific T wave abnormality   Medications: I ordered medication including morphine , Zofran , Protonix , fluid bolus for dehydration, abdominal pain, suspected gastric ulcer.  I have reviewed the patients home medicines and have made adjustments as needed.   3:03 PM Care of Tyna C Slawson transferred to Dr. Dorcus and Dr. Simon at the end of my shift as the patient will require reassessment once labs/imaging have resulted. Patient presentation, ED course, and plan of care discussed with review of all pertinent labs and imaging. Please see his/her note for further details regarding further ED course and disposition. Plan at time of handoff is reassess after lab work, CT abdomen pelvis, and response to treatment. This may be altered or completely changed at the discretion of the oncoming team pending results of further workup.   Final diagnoses:  None    ED Discharge Orders     None          Rosan Sherlean VEAR DEVONNA 06/07/24 1503    Randol Simmonds, MD 06/08/24 [phone]

## 2024-06-08 DIAGNOSIS — Z833 Family history of diabetes mellitus: Secondary | ICD-10-CM | POA: Diagnosis not present

## 2024-06-08 DIAGNOSIS — Z90711 Acquired absence of uterus with remaining cervical stump: Secondary | ICD-10-CM | POA: Diagnosis not present

## 2024-06-08 DIAGNOSIS — E871 Hypo-osmolality and hyponatremia: Secondary | ICD-10-CM | POA: Diagnosis present

## 2024-06-08 DIAGNOSIS — J45909 Unspecified asthma, uncomplicated: Secondary | ICD-10-CM | POA: Diagnosis present

## 2024-06-08 DIAGNOSIS — Z8249 Family history of ischemic heart disease and other diseases of the circulatory system: Secondary | ICD-10-CM | POA: Diagnosis not present

## 2024-06-08 DIAGNOSIS — E896 Postprocedural adrenocortical (-medullary) hypofunction: Secondary | ICD-10-CM | POA: Diagnosis present

## 2024-06-08 DIAGNOSIS — F4320 Adjustment disorder, unspecified: Secondary | ICD-10-CM | POA: Diagnosis present

## 2024-06-08 DIAGNOSIS — T380X6A Underdosing of glucocorticoids and synthetic analogues, initial encounter: Secondary | ICD-10-CM | POA: Diagnosis present

## 2024-06-08 DIAGNOSIS — Z79899 Other long term (current) drug therapy: Secondary | ICD-10-CM | POA: Diagnosis not present

## 2024-06-08 DIAGNOSIS — E860 Dehydration: Secondary | ICD-10-CM | POA: Diagnosis present

## 2024-06-08 DIAGNOSIS — K219 Gastro-esophageal reflux disease without esophagitis: Secondary | ICD-10-CM | POA: Diagnosis present

## 2024-06-08 DIAGNOSIS — Z7952 Long term (current) use of systemic steroids: Secondary | ICD-10-CM | POA: Diagnosis not present

## 2024-06-08 DIAGNOSIS — E2740 Unspecified adrenocortical insufficiency: Secondary | ICD-10-CM | POA: Diagnosis not present

## 2024-06-08 DIAGNOSIS — I69398 Other sequelae of cerebral infarction: Secondary | ICD-10-CM | POA: Diagnosis not present

## 2024-06-08 DIAGNOSIS — Z79890 Hormone replacement therapy (postmenopausal): Secondary | ICD-10-CM | POA: Diagnosis not present

## 2024-06-08 DIAGNOSIS — Z8711 Personal history of peptic ulcer disease: Secondary | ICD-10-CM | POA: Diagnosis not present

## 2024-06-08 DIAGNOSIS — R1013 Epigastric pain: Secondary | ICD-10-CM | POA: Diagnosis present

## 2024-06-08 DIAGNOSIS — E785 Hyperlipidemia, unspecified: Secondary | ICD-10-CM | POA: Diagnosis present

## 2024-06-08 DIAGNOSIS — Z880 Allergy status to penicillin: Secondary | ICD-10-CM | POA: Diagnosis not present

## 2024-06-08 DIAGNOSIS — E039 Hypothyroidism, unspecified: Secondary | ICD-10-CM | POA: Diagnosis present

## 2024-06-08 DIAGNOSIS — F32A Depression, unspecified: Secondary | ICD-10-CM | POA: Diagnosis present

## 2024-06-08 DIAGNOSIS — Z886 Allergy status to analgesic agent status: Secondary | ICD-10-CM | POA: Diagnosis not present

## 2024-06-08 DIAGNOSIS — H5462 Unqualified visual loss, left eye, normal vision right eye: Secondary | ICD-10-CM | POA: Diagnosis present

## 2024-06-08 DIAGNOSIS — M899 Disorder of bone, unspecified: Secondary | ICD-10-CM | POA: Diagnosis present

## 2024-06-08 DIAGNOSIS — T471X6A Underdosing of other antacids and anti-gastric-secretion drugs, initial encounter: Secondary | ICD-10-CM | POA: Diagnosis present

## 2024-06-08 LAB — RAPID URINE DRUG SCREEN, HOSP PERFORMED
Amphetamines: NOT DETECTED
Barbiturates: NOT DETECTED
Benzodiazepines: NOT DETECTED
Cocaine: NOT DETECTED
Opiates: POSITIVE — AB
Tetrahydrocannabinol: NOT DETECTED

## 2024-06-08 LAB — CBC
HCT: 40.1 % (ref 36.0–46.0)
Hemoglobin: 13 g/dL (ref 12.0–15.0)
MCH: 29.3 pg (ref 26.0–34.0)
MCHC: 32.4 g/dL (ref 30.0–36.0)
MCV: 90.5 fL (ref 80.0–100.0)
Platelets: 285 K/uL (ref 150–400)
RBC: 4.43 MIL/uL (ref 3.87–5.11)
RDW: 12.8 % (ref 11.5–15.5)
WBC: 7.6 K/uL (ref 4.0–10.5)
nRBC: 0 % (ref 0.0–0.2)

## 2024-06-08 LAB — BASIC METABOLIC PANEL WITH GFR
Anion gap: 13 (ref 5–15)
BUN: 21 mg/dL (ref 8–23)
CO2: 21 mmol/L — ABNORMAL LOW (ref 22–32)
Calcium: 10.5 mg/dL — ABNORMAL HIGH (ref 8.9–10.3)
Chloride: 101 mmol/L (ref 98–111)
Creatinine, Ser: 1.19 mg/dL — ABNORMAL HIGH (ref 0.44–1.00)
GFR, Estimated: 51 mL/min — ABNORMAL LOW (ref >60)
Glucose, Bld: 129 mg/dL — ABNORMAL HIGH (ref 70–99)
Potassium: 5 mmol/L (ref 3.5–5.1)
Sodium: 135 mmol/L (ref 135–145)

## 2024-06-08 LAB — HIV ANTIBODY (ROUTINE TESTING W REFLEX): HIV Screen 4th Generation wRfx: NONREACTIVE

## 2024-06-08 MED ORDER — HYDROCORTISONE 10 MG PO TABS
10.0000 mg | ORAL_TABLET | Freq: Every day | ORAL | Status: DC
  Filled 2024-06-08: qty 1

## 2024-06-08 MED ORDER — HYDROCORTISONE 5 MG PO TABS
15.0000 mg | ORAL_TABLET | Freq: Every day | ORAL | Status: DC
  Filled 2024-06-08: qty 3

## 2024-06-08 NOTE — Progress Notes (Signed)
 PROGRESS NOTE    [name]  FMW:[id number] DOB: [birth year] DOA: 06/07/2024 PCP: Oley Bascom RAMAN, NP   Brief Narrative:   68 y.o. female with medical history significant for adrenal insufficiency on chronic hydrocortisone , remote history of atrial myxoma s/p resection, history of embolic stroke with left eye blindness, HLD, hypothyroidism, chronic migraines, PUD, symptomatic NSVT/PVCs, depression who presented to the ED for evaluation of nausea, vomiting, abdominal pain. He notes that she has been out of her hydrocortisone  and omeprazole  for at least 2 days. She last saw her endocrinologist in November 2023 but has not followed up since then.  Started on IV Hydrocortisone  and symptoms are improving now. She lives at home with her husband.  Assessment & Plan:  Principal Problem:   Abdominal pain Active Problems:   Adrenal insufficiency   Hyponatremia   Hypothyroidism   Nausea/vomiting/abdominal pain:  Symptoms could be due to adrenal insufficiency at risk of adrenal crisis.  She does also have remote history of PUD and H. pylori which is also in differential as well as gastritis.   CT A/P was unrevealing for acute pathology. - Started on stress dose steroids with IV hydrocortisone  100 mg once followed by 50 mg every 6 hours. Transition to oral steroids on 9/29. - Received IV fluid hydration overnight - Continue IV Protonix  40 mg twice daily   Adrenal insufficiency: S/p bilateral adrenalectomy in 1982.  Has been on chronic hydrocortisone  15 mg a.m. and 10 mg 2-4 p.m. Reportedly ran out of her medications about 2-3 days prior to this admission.  Has borderline low blood pressure and is high risk for developing adrenal crisis.  Started on stress dose steroids as above.   Mild hyponatremia: Resolved now This could be from volume depletion and/or adrenal insufficiency.  Continue management as above.   Hypothyroidism: Continue Synthroid . TSH back in May 2025 was normal.    Hyperlipidemia: Continue rosuvastatin .   Sclerotic lesions within pelvis and spine: CT showed multiple sclerotic lesions within the pelvis and spine, increased in number from prior CT in April 2023.  Differential includes medical renal disease, primary bone disease, or metastatic disease. She also has hypercalcemia. follow-up MRI of the lumbar spine.   DVT prophylaxis: SCDs Start: 06/07/24 2045     Code Status: Full Code Family Communication:  None at the bedside Status is: Observation The patient remains OBS appropriate and will d/c before 2 midnights.    Subjective:  She is awake and alert. She said that she ran out of her steroids for almost 3 days but her son has gotten a refill on that now. She lives at home with her husband and son. She walks with the help of a cane.  Examination:  General exam: Appears calm and comfortable  Respiratory system: Clear to auscultation. Respiratory effort normal. Cardiovascular system: S1 & S2 heard, RRR. No JVD, murmurs, rubs, gallops or clicks. No pedal edema. Gastrointestinal system: Abdomen is nondistended, soft and nontender. No organomegaly or masses felt. Normal bowel sounds heard. Central nervous system: Alert and oriented. No focal neurological deficits. Extremities: Symmetric 5 x 5 power. Skin: No rashes, lesions or ulcers Psychiatry: Judgement and insight appear normal. Mood & affect appropriate.      Diet Orders (From admission, onward)     Start     Ordered   06/07/24 2111  Diet NPO time specified Except for: Sips with Meds, Ice Chips  Diet effective now       Question Answer Comment  Except for [employer]  with Meds   Except for Ice Chips      06/07/24 2110            Objective: Vitals:   06/07/24 2356 06/08/24 0517 06/08/24 0644 06/08/24 0841  BP: 107/68 106/69  91/66  Pulse: (!) 113 (!) 104 (!) 103 (!) 102  Resp:  18  18  Temp: 99.2 F (37.3 C) 97.9 F (36.6 C)  97.6 F (36.4 C)  TempSrc: Oral Oral  Oral   SpO2: 93% (!) 79% 98% 97%  Weight:      Height:        Intake/Output Summary (Last 24 hours) at 06/08/2024 1012 Last data filed at 06/07/2024 1600 Gross per 24 hour  Intake 1000 ml  Output --  Net 1000 ml   Filed Weights   06/07/24 1347  Weight: 68 kg    Scheduled Meds:  hydrocortisone  sod succinate (SOLU-CORTEF ) inj  50 mg Intravenous Q6H   levothyroxine   75 mcg Oral QAC breakfast   pantoprazole  (PROTONIX ) IV  40 mg Intravenous Q12H   rosuvastatin   10 mg Oral Daily   sodium chloride  flush  3 mL Intravenous Q12H   Continuous Infusions:  Nutritional status     Body mass index is 30.3 kg/m.  Data Reviewed:   CBC: Recent Labs  Lab 06/07/24 1402 06/08/24 0428  WBC 7.8 7.6  HGB 15.1* 13.0  HCT 45.1 40.1  MCV 88.4 90.5  PLT 360 285   Basic Metabolic Panel: Recent Labs  Lab 06/07/24 1402 06/08/24 0428  NA 131* 135  K 4.2 5.0  CL 98 101  CO2 19* 21*  GLUCOSE 91 129*  BUN 22 21  CREATININE 1.16* 1.19*  CALCIUM  11.9* 10.5*   GFR: Estimated Creatinine Clearance: 39.5 mL/min (A) (by C-G formula based on SCr of 1.19 mg/dL (H)). Liver Function Tests: Recent Labs  Lab 06/07/24 1402  AST 36  ALT 20  ALKPHOS 57  BILITOT 1.3*  PROT 7.9  ALBUMIN 3.9   Recent Labs  Lab 06/07/24 1402  LIPASE 11   No results for input(s): AMMONIA in the last 168 hours. Coagulation Profile: No results for input(s): INR, PROTIME in the last 168 hours. Cardiac Enzymes: No results for input(s): CKTOTAL, CKMB, CKMBINDEX, TROPONINI in the last 168 hours. BNP (last 3 results) No results for input(s): PROBNP in the last 8760 hours. HbA1C: No results for input(s): HGBA1C in the last 72 hours. CBG: No results for input(s): GLUCAP in the last 168 hours. Lipid Profile: No results for input(s): CHOL, HDL, LDLCALC, TRIG, CHOLHDL, LDLDIRECT in the last 72 hours. Thyroid  Function Tests: No results for input(s): TSH, T4TOTAL, FREET4, T3FREE,  THYROIDAB in the last 72 hours. Anemia Panel: No results for input(s): VITAMINB12, FOLATE, FERRITIN, TIBC, IRON, RETICCTPCT in the last 72 hours. Sepsis Labs: Recent Labs  Lab 06/07/24 1410  LATICACIDVEN 1.7    No results found for this or any previous visit (from the past 240 hours).       Radiology Studies: CT ABDOMEN PELVIS W CONTRAST Result Date: 06/07/2024 CLINICAL DATA:  Abdominal pain.  Acute nonlocalized EXAM: CT ABDOMEN AND PELVIS WITH CONTRAST TECHNIQUE: Multidetector CT imaging of the abdomen and pelvis was performed using the standard protocol following bolus administration of intravenous contrast. RADIATION DOSE REDUCTION: This exam was performed according to the departmental dose-optimization program which includes automated exposure control, adjustment of the mA and/or kV according to patient size and/or use of iterative reconstruction technique. CONTRAST:  75mL OMNIPAQUE  IOHEXOL  350 MG/ML  SOLN COMPARISON:  CT 12/04/2022 FINDINGS: Lower chest: Mild linear interstitial thickening and atelectasis at the lung bases. No evidence of acute infection. Hepatobiliary: Several small benign hepatic cysts unchanged. Gallbladder normal. No biliary duct dilatation. Pancreas: Pancreas is normal. No ductal dilatation. No pancreatic inflammation. Spleen: Normal spleen Adrenals/urinary tract: Surgical clips at the level in LEFT and RIGHT adrenal gland. Adrenal glands not well appreciated. No hydronephrosis. Bilateral low-attenuation cysts are not changed from comparison CT 2024. Stomach/Bowel: Stomach, small bowel, appendix, and cecum are normal. Multiple diverticula of the descending colon and sigmoid colon without acute inflammation. Vascular/Lymphatic: Abdominal aorta is normal caliber. No periportal or retroperitoneal adenopathy. No pelvic adenopathy. Reproductive: Post hysterectomy.  Adnexa unremarkable Other: No free fluid. Musculoskeletal: Multiple sclerotic lesions throughout the  pelvis and spine. Lesion increased in number and conspicuity in the thoracic lumbar spine and sacrum. Example lesion at T11 measures 15 mm image 18/3. Lesion at L1 measures 8 mm. Multiple small lesions throughout the sacrum. IMPRESSION: 1. No acute findings in the abdomen pelvis. 2. Normal gallbladder and appendix. 3. Multiple sclerotic lesions within the pelvis and spine. Lesions have increased in number from CT 12/24/2021. Unclear etiology. Differential include medical renal disease, primary bone disease, or metastatic disease. Consider MRI of the lumbar spine for further evaluation. Electronically Signed   By: Jackquline Boxer M.D.   On: 06/07/2024 17:32   DG Chest Portable 1 View Result Date: 06/07/2024 CLINICAL DATA:  Chest pain.  Nausea, vomiting, and abdominal pain. EXAM: PORTABLE CHEST 1 VIEW COMPARISON:  09/10/2023. FINDINGS: The heart is borderline enlarged and mediastinal contours are within normal limits. Sternal wires and epicardial pacer wires are noted over the midline. Multiple no consolidation, effusion, or pneumothorax is seen. No acute osseous abnormality. Surgical clips are noted in the upper abdomen. IMPRESSION: No active disease. Electronically Signed   By: Leita Birmingham M.D.   On: 06/07/2024 15:07         LOS: 0 days   Time spent= 40 mins    Deliliah Room, MD [facility]  If 7PM-7AM, please contact night-coverage  06/08/2024, 10:12 AM

## 2024-06-08 NOTE — Care Management Obs Status (Signed)
 MEDICARE OBSERVATION STATUS NOTIFICATION   Patient Details  Name: [name] MRN: [id number] Date of Birth: 02/14/1956   Medicare Observation Status Notification Given:  Yes    Camrie Stock G., RN 06/08/2024, 9:19 AM

## 2024-06-09 ENCOUNTER — Encounter: Payer: Self-pay | Admitting: Nurse Practitioner

## 2024-06-09 ENCOUNTER — Other Ambulatory Visit (HOSPITAL_COMMUNITY): Payer: Self-pay

## 2024-06-09 ENCOUNTER — Ambulatory Visit (INDEPENDENT_AMBULATORY_CARE_PROVIDER_SITE_OTHER): Payer: Self-pay | Admitting: Nurse Practitioner

## 2024-06-09 VITALS — BP 101/61 | HR 97 | Ht 59.0 in | Wt 180.0 lb

## 2024-06-09 DIAGNOSIS — E039 Hypothyroidism, unspecified: Secondary | ICD-10-CM

## 2024-06-09 DIAGNOSIS — E2740 Unspecified adrenocortical insufficiency: Secondary | ICD-10-CM | POA: Diagnosis not present

## 2024-06-09 LAB — CBC WITH DIFFERENTIAL/PLATELET
Abs Immature Granulocytes: 0.05 K/uL (ref 0.00–0.07)
Basophils Absolute: 0 K/uL (ref 0.0–0.1)
Basophils Relative: 0 %
Eosinophils Absolute: 0 K/uL (ref 0.0–0.5)
Eosinophils Relative: 0 %
HCT: 32.8 % — ABNORMAL LOW (ref 36.0–46.0)
Hemoglobin: 10.8 g/dL — ABNORMAL LOW (ref 12.0–15.0)
Immature Granulocytes: 0 %
Lymphocytes Relative: 15 %
Lymphs Abs: 1.8 K/uL (ref 0.7–4.0)
MCH: 29.3 pg (ref 26.0–34.0)
MCHC: 32.9 g/dL (ref 30.0–36.0)
MCV: 89.1 fL (ref 80.0–100.0)
Monocytes Absolute: 1.3 K/uL — ABNORMAL HIGH (ref 0.1–1.0)
Monocytes Relative: 11 %
Neutro Abs: 8.7 K/uL — ABNORMAL HIGH (ref 1.7–7.7)
Neutrophils Relative %: 74 %
Platelets: 284 K/uL (ref 150–400)
RBC: 3.68 MIL/uL — ABNORMAL LOW (ref 3.87–5.11)
RDW: 12.9 % (ref 11.5–15.5)
WBC: 11.8 K/uL — ABNORMAL HIGH (ref 4.0–10.5)
nRBC: 0 % (ref 0.0–0.2)

## 2024-06-09 LAB — BASIC METABOLIC PANEL WITH GFR
Anion gap: 10 (ref 5–15)
BUN: 27 mg/dL — ABNORMAL HIGH (ref 8–23)
CO2: 24 mmol/L (ref 22–32)
Calcium: 10.1 mg/dL (ref 8.9–10.3)
Chloride: 100 mmol/L (ref 98–111)
Creatinine, Ser: 1.11 mg/dL — ABNORMAL HIGH (ref 0.44–1.00)
GFR, Estimated: 55 mL/min — ABNORMAL LOW (ref >60)
Glucose, Bld: 132 mg/dL — ABNORMAL HIGH (ref 70–99)
Potassium: 5 mmol/L (ref 3.5–5.1)
Sodium: 134 mmol/L — ABNORMAL LOW (ref 135–145)

## 2024-06-09 MED ORDER — HYDROCORTISONE 10 MG PO TABS
10.0000 mg | ORAL_TABLET | Freq: Every day | ORAL | Status: DC
  Administered 2024-06-09: 10 mg via ORAL
  Filled 2024-06-09: qty 1

## 2024-06-09 MED ORDER — HYDROCORTISONE 5 MG PO TABS
5.0000 mg | ORAL_TABLET | Freq: Every day | ORAL | Status: DC
  Filled 2024-06-09: qty 1

## 2024-06-09 MED ORDER — PANTOPRAZOLE SODIUM 20 MG PO TBEC
20.0000 mg | DELAYED_RELEASE_TABLET | Freq: Every day | ORAL | 0 refills | Status: DC
  Filled 2024-06-09 (×2): qty 15, 15d supply, fill #0

## 2024-06-09 NOTE — Discharge Summary (Signed)
 Physician Discharge Summary   Patient: [name] MRN: [id number] DOB: [birth year]  Admit date:     06/07/2024  Discharge date: 06/09/24  Discharge Physician: [name]   PCP: Mikayla Bascom RAMAN, NP   Recommendations at discharge:    F./u with your PCP in one week F/u outpatient with your endocrinologist. Call to make an appointment.  Discharge Diagnoses: Principal Problem:   Abdominal pain Active Problems:   Adrenal insufficiency   Hyponatremia   Hypothyroidism   Hospital Course:  68 y.o. female with medical history significant for adrenal insufficiency on chronic hydrocortisone , remote history of atrial myxoma s/p resection, history of embolic stroke with left eye blindness, HLD, hypothyroidism, chronic migraines, PUD, symptomatic NSVT/PVCs, depression who presented to the ED for evaluation of nausea, vomiting, abdominal pain. He noted that she has been out of her hydrocortisone  and omeprazole  for at least 2 days. She last saw her endocrinologist in November 2023 but has not followed up since then.  Started on IV Hydrocortisone  and symptoms resolved. Transitioned to oral hydrocortisone  (home dose on 9/29). Hemodynamically stable and unremarkable blood work on the discharge day. She told me that she has already received the refill on her hydrocortisone .  She lives at home with her son and husband.       Consultants: None Procedures performed: None  Disposition: Home Diet recommendation:  Cardiac diet DISCHARGE MEDICATION: Allergies as of 06/09/2024       Reactions   Aspirin  Other (See Comments)   Can take baby aspirin  (heart flutters with high doses)   Ibuprofen [ibuprofen] Nausea And Vomiting   Penicillins Nausea And Vomiting   Has patient had a PCN reaction causing immediate rash, facial/tongue/throat swelling, SOB or lightheadedness with hypotension: Yes Has patient had a PCN reaction causing severe rash involving mucus membranes or skin necrosis: No Has  patient had a PCN reaction that required hospitalization No Has patient had a PCN reaction occurring within the last 10 years: No If all of the above answers are NO, then may proceed with Cephalosporin use.        Medication List     STOP taking these medications    omeprazole  20 MG capsule Commonly known as: PRILOSEC       TAKE these medications    aluminum -magnesium  hydroxide-simethicone  200-200-20 MG/5ML Susp Commonly known as: MAALOX Take 30 mLs by mouth 4 (four) times daily -  before meals and at bedtime.   aspirin  EC 81 MG tablet Take 81 mg by mouth daily. Swallow whole.   Emgality  120 MG/ML Soaj Generic drug: Galcanezumab -gnlm Inject 120 mg into the skin every 30 (thirty) days.   fenofibrate  48 MG tablet Commonly known as: TRICOR  Take 1 tablet (48 mg total) by mouth daily.   hydrocortisone  10 MG tablet Commonly known as: CORTEF  take 15 MG (1&1/2 tablets) with Breakfast AND 10 MG (ONE tablet) between 2-4 as needed daily]   hydrOXYzine  10 MG tablet Commonly known as: ATARAX  TAKE ONE TABLET BY MOUTH AT BEDTIME AS NEEDED   levothyroxine  75 MCG tablet Commonly known as: SYNTHROID  Take 1 tablet (75 mcg total) by mouth daily before breakfast.   nitroGLYCERIN  0.4 MG SL tablet Commonly known as: NITROSTAT  Place 1 tablet (0.4 mg total) under the tongue every 5 (five) minutes x 3 doses as needed for chest pain.   Nurtec 75 MG Tbdp Generic drug: Rimegepant Sulfate Take 1 tablet (75 mg total) by mouth daily as needed. For migraines. Take as close to onset of migraine as  possible. One daily maximum.   omega-3 acid ethyl esters 1 g capsule Commonly known as: LOVAZA  TAKE ONE CAPSULE BY MOUTH TWICE DAILY   ondansetron  4 MG disintegrating tablet Commonly known as: ZOFRAN -ODT Take 1 tablet (4 mg total) by mouth every 8 (eight) hours as needed for nausea or vomiting.   pantoprazole  20 MG tablet Commonly known as: Protonix  Take 1 tablet (20 mg total) by mouth  daily.   rosuvastatin  10 MG tablet Commonly known as: CRESTOR  Take 1 tablet (10 mg total) by mouth daily.   venlafaxine  XR 150 MG 24 hr capsule Commonly known as: EFFEXOR -XR Take 1 capsule (150 mg total) by mouth daily.   Vitamin D  (Ergocalciferol ) 1.25 MG (50000 UNIT) Caps capsule Commonly known as: DRISDOL  TAKE ONE CAPSULE BY MOUTH EVERY SEVEN DAYS        Follow-up Information     Mikayla Bascom RAMAN, NP. Schedule an appointment as soon as possible for a visit in 1 week(s).   Specialties: Pulmonary Disease, Endocrinology Contact information: [address] [place] [address] [phone]                Discharge Exam: [name]   06/07/24 1347  Weight: 68 kg   Constitutional: NAD, calm, comfortable Eyes: PERRL, lids and conjunctivae normal ENMT: Mucous membranes are moist. Posterior pharynx clear of any exudate or lesions.Normal dentition.  Neck: normal, supple, no masses, no thyromegaly Respiratory: clear to auscultation bilaterally, no wheezing, no crackles. Normal respiratory effort. No accessory muscle use.  Cardiovascular: Regular rate and rhythm, no murmurs / rubs / gallops. No extremity edema. 2+ pedal pulses. No carotid bruits.  Abdomen: no tenderness, no masses palpated. No hepatosplenomegaly. Bowel sounds positive.  Musculoskeletal: no clubbing / cyanosis. No joint deformity upper and lower extremities. Good ROM, no contractures. Normal muscle tone.  Skin: no rashes, lesions, ulcers. No induration Neurologic: CN 2-12 grossly intact. Sensation intact, DTR normal. Strength 5/5 x all 4 extremities.  Psychiatric: Normal judgment and insight. Alert and oriented x 3. Normal mood.    Condition at discharge: good  The results of significant diagnostics from this hospitalization (including imaging, microbiology, ancillary and laboratory) are listed below for reference.   Imaging Studies: CT ABDOMEN PELVIS W CONTRAST Result Date:  06/07/2024 CLINICAL DATA:  Abdominal pain.  Acute nonlocalized EXAM: CT ABDOMEN AND PELVIS WITH CONTRAST TECHNIQUE: Multidetector CT imaging of the abdomen and pelvis was performed using the standard protocol following bolus administration of intravenous contrast. RADIATION DOSE REDUCTION: This exam was performed according to the departmental dose-optimization program which includes automated exposure control, adjustment of the mA and/or kV according to patient size and/or use of iterative reconstruction technique. CONTRAST:  75mL OMNIPAQUE  IOHEXOL  350 MG/ML SOLN COMPARISON:  CT 12/04/2022 FINDINGS: Lower chest: Mild linear interstitial thickening and atelectasis at the lung bases. No evidence of acute infection. Hepatobiliary: Several small benign hepatic cysts unchanged. Gallbladder normal. No biliary duct dilatation. Pancreas: Pancreas is normal. No ductal dilatation. No pancreatic inflammation. Spleen: Normal spleen Adrenals/urinary tract: Surgical clips at the level in LEFT and RIGHT adrenal gland. Adrenal glands not well appreciated. No hydronephrosis. Bilateral low-attenuation cysts are not changed from comparison CT 2024. Stomach/Bowel: Stomach, small bowel, appendix, and cecum are normal. Multiple diverticula of the descending colon and sigmoid colon without acute inflammation. Vascular/Lymphatic: Abdominal aorta is normal caliber. No periportal or retroperitoneal adenopathy. No pelvic adenopathy. Reproductive: Post hysterectomy.  Adnexa unremarkable Other: No free fluid. Musculoskeletal: Multiple sclerotic lesions throughout the pelvis and  spine. Lesion increased in number and conspicuity in the thoracic lumbar spine and sacrum. Example lesion at T11 measures 15 mm image 18/3. Lesion at L1 measures 8 mm. Multiple small lesions throughout the sacrum. IMPRESSION: 1. No acute findings in the abdomen pelvis. 2. Normal gallbladder and appendix. 3. Multiple sclerotic lesions within the pelvis and spine. Lesions  have increased in number from CT 12/24/2021. Unclear etiology. Differential include medical renal disease, primary bone disease, or metastatic disease. Consider MRI of the lumbar spine for further evaluation. Electronically Signed   By: Jackquline Boxer M.D.   On: 06/07/2024 17:32   DG Chest Portable 1 View Result Date: 06/07/2024 CLINICAL DATA:  Chest pain.  Nausea, vomiting, and abdominal pain. EXAM: PORTABLE CHEST 1 VIEW COMPARISON:  09/10/2023. FINDINGS: The heart is borderline enlarged and mediastinal contours are within normal limits. Sternal wires and epicardial pacer wires are noted over the midline. Multiple no consolidation, effusion, or pneumothorax is seen. No acute osseous abnormality. Surgical clips are noted in the upper abdomen. IMPRESSION: No active disease. Electronically Signed   By: Leita Birmingham M.D.   On: 06/07/2024 15:07    Microbiology: Results for orders placed or performed during the hospital encounter of 02/18/23  SARS Coronavirus 2 by RT PCR (hospital order, performed in [facility] hospital lab) *cepheid single result test* Anterior Nasal Swab     Status: None   Collection Time: 02/18/23  7:12 PM   Specimen: Anterior Nasal Swab  Result Value Ref Range Status   SARS Coronavirus 2 by RT PCR NEGATIVE NEGATIVE Final    Comment: (NOTE) SARS-CoV-2 target nucleic acids are NOT DETECTED.  The SARS-CoV-2 RNA is generally detectable in upper and lower respiratory specimens during the acute phase of infection. The lowest concentration of SARS-CoV-2 viral copies this assay can detect is 250 copies / mL. A negative result does not preclude SARS-CoV-2 infection and should not be used as the sole basis for treatment or other patient management decisions.  A negative result may occur with improper specimen collection / handling, submission of specimen other than nasopharyngeal swab, presence of viral mutation(s) within the areas targeted by this assay, and inadequate number of  viral copies (<250 copies / mL). A negative result must be combined with clinical observations, patient history, and epidemiological information.  Fact Sheet for Patients:   RoadLapTop.co.za  Fact Sheet for Healthcare Providers: http://kim-miller.com/  This test is not yet approved or  cleared by the [place]  FDA and has been authorized for detection and/or diagnosis of SARS-CoV-2 by FDA under an Emergency Use Authorization (EUA).  This EUA will remain in effect (meaning this test can be used) for the duration of the COVID-19 declaration under Section 564(b)(1) of the Act, 21 U.S.C. section 360bbb-3(b)(1), unless the authorization is terminated or revoked sooner.  Performed at [facility], [address], [place], [place] [address]     Labs: CBC: Recent Labs  Lab 06/07/24 1402 06/08/24 0428 06/09/24 0741  WBC 7.8 7.6 11.8*  NEUTROABS  --   --  8.7*  HGB 15.1* 13.0 10.8*  HCT 45.1 40.1 32.8*  MCV 88.4 90.5 89.1  PLT 360 285 284   Basic Metabolic Panel: Recent Labs  Lab 06/07/24 1402 06/08/24 0428 06/09/24 0741  NA 131* 135 134*  K 4.2 5.0 5.0  CL 98 101 100  CO2 19* 21* 24  GLUCOSE 91 129* 132*  BUN 22 21 27*  CREATININE 1.16* 1.19* 1.11*  CALCIUM  11.9* 10.5* 10.1  Liver Function Tests: Recent Labs  Lab 06/07/24 1402  AST 36  ALT 20  ALKPHOS 57  BILITOT 1.3*  PROT 7.9  ALBUMIN 3.9   CBG: No results for input(s): GLUCAP in the last 168 hours.  Discharge time spent: 42 minutes.  Signed: Deliliah Room, MD [facility] 06/09/2024

## 2024-06-09 NOTE — Progress Notes (Signed)
 Subjective   Patient ID: [name], female    DOB: 1956-08-05, 68 y.o.   MRN: [id number]  Chief Complaint  Patient presents with   Hospitalization Follow-up    Hospital f/u    Referring provider: Oley Bascom RAMAN, NP  Nicoletta JAYSON Gearing is a 68 y.o. female with Past Medical History: No date: Adrenal hypofunction No date: Allergy No date: Amblyopia No date: Anemia No date: Arthritis     Comment:  in my lower back No date: Asthma No date: Atrial myxoma 1978: Blindness of left eye No date: Blood transfusion No date: Breast cyst No date: Constipation No date: Cushing's syndrome No date: CVA (cerebral infarction) No date: Depression No date: Depression No date: GERD (gastroesophageal reflux disease) No date: Glucocorticoid deficiency No date: Hemorrhoids 1977: History of open heart surgery No date: Hyperlipemia No date: Hypothyroidism     Comment:  per office note dated 03/25/2014 No date: Hypoxia No date: Migraine No date: Peptic ulcer associated with Helicobacter pylori infection No date: Reflux 1978: Stroke [facility])     Comment:  piece tumor broke off & went to left eye; leaving me               blind   HPI  Patient presents today for hospital follow-up.  She was released from the hospital today.  She does need to follow-up with endocrinology and referral has been placed today.  The number was given to the patient in office today to call make an appointment.  She was in the hospital for adrenal insufficiency. Denies f/c/s, n/v/d, hemoptysis, PND, leg swelling Denies chest pain or edema     Allergies  Allergen Reactions   Aspirin  Other (See Comments)    Can take baby aspirin  (heart flutters with high doses)   Ibuprofen [Ibuprofen] Nausea And Vomiting   Penicillins Nausea And Vomiting    Has patient had a PCN reaction causing immediate rash, facial/tongue/throat swelling, SOB or lightheadedness with hypotension: Yes Has patient had a PCN reaction  causing severe rash involving mucus membranes or skin necrosis: No Has patient had a PCN reaction that required hospitalization No Has patient had a PCN reaction occurring within the last 10 years: No If all of the above answers are NO, then may proceed with Cephalosporin use.     Immunization History  Administered Date(s) Administered   Influenza Split 08/03/2011, 07/18/2015   Influenza,inj,Quad PF,6+ Mos 07/02/2016, 09/27/2018, 05/30/2021, 07/13/2022   Tdap 09/27/2018    Tobacco History: Social History   Tobacco Use  Smoking Status Never  Smokeless Tobacco Never   Counseling given: Not Answered   Outpatient Encounter Medications as of 06/09/2024  Medication Sig   pantoprazole  (PROTONIX ) 20 MG tablet Take 1 tablet (20 mg total) by mouth daily.   aluminum -magnesium  hydroxide-simethicone  (MAALOX) 200-200-20 MG/5ML SUSP Take 30 mLs by mouth 4 (four) times daily -  before meals and at bedtime. (Patient not taking: No sig reported)   aspirin  EC 81 MG tablet Take 81 mg by mouth daily. Swallow whole. (Patient not taking: No sig reported)   fenofibrate  (TRICOR ) 48 MG tablet Take 1 tablet (48 mg total) by mouth daily.   Galcanezumab -gnlm (EMGALITY ) 120 MG/ML SOAJ Inject 120 mg into the skin every 30 (thirty) days.   hydrocortisone  (CORTEF ) 10 MG tablet take 15 MG (1&1/2 tablets) with Breakfast AND 10 MG (ONE tablet) between 2-4 as needed daily]   hydrOXYzine  (ATARAX ) 10 MG tablet TAKE ONE TABLET BY MOUTH AT BEDTIME AS NEEDED  levothyroxine  (SYNTHROID ) 75 MCG tablet Take 1 tablet (75 mcg total) by mouth daily before breakfast.   nitroGLYCERIN  (NITROSTAT ) 0.4 MG SL tablet Place 1 tablet (0.4 mg total) under the tongue every 5 (five) minutes x 3 doses as needed for chest pain.   omega-3 acid ethyl esters (LOVAZA ) 1 g capsule TAKE ONE CAPSULE BY MOUTH TWICE DAILY   ondansetron  (ZOFRAN -ODT) 4 MG disintegrating tablet Take 1 tablet (4 mg total) by mouth every 8 (eight) hours as needed for  nausea or vomiting.   Rimegepant Sulfate (NURTEC) 75 MG TBDP Take 1 tablet (75 mg total) by mouth daily as needed. For migraines. Take as close to onset of migraine as possible. One daily maximum.   rosuvastatin  (CRESTOR ) 10 MG tablet Take 1 tablet (10 mg total) by mouth daily.   venlafaxine  XR (EFFEXOR -XR) 150 MG 24 hr capsule Take 1 capsule (150 mg total) by mouth daily.   Vitamin D , Ergocalciferol , (DRISDOL ) 1.25 MG (50000 UNIT) CAPS capsule TAKE ONE CAPSULE BY MOUTH EVERY SEVEN DAYS   [DISCONTINUED] acetaminophen  (TYLENOL ) suppository 650 mg    [DISCONTINUED] acetaminophen  (TYLENOL ) tablet 650 mg    [DISCONTINUED] bisacodyl (DULCOLAX) EC tablet 5 mg    [DISCONTINUED] hydrocortisone  (CORTEF ) tablet 10 mg    [DISCONTINUED] hydrocortisone  (CORTEF ) tablet 5 mg    [DISCONTINUED] levothyroxine  (SYNTHROID ) tablet 75 mcg    [DISCONTINUED] ondansetron  (ZOFRAN ) injection 4 mg    [DISCONTINUED] ondansetron  (ZOFRAN ) tablet 4 mg    [DISCONTINUED] rosuvastatin  (CRESTOR ) tablet 10 mg    [DISCONTINUED] senna-docusate (Senokot-S) tablet 1 tablet    [DISCONTINUED] sodium chloride  flush (NS) 0.9 % injection 3 mL    No facility-administered encounter medications on file as of 06/09/2024.    Review of Systems  Review of Systems  Constitutional: Negative.   HENT: Negative.    Cardiovascular: Negative.   Gastrointestinal: Negative.   Allergic/Immunologic: Negative.   Neurological: Negative.   Psychiatric/Behavioral: Negative.       Objective:   BP 101/61 (BP Location: Right Arm, Patient Position: Sitting, Cuff Size: Large)   Pulse 97   Ht 4' 11 (1.499 m)   Wt 180 lb (81.6 kg)   SpO2 96%   BMI 36.36 kg/m   Wt Readings from Last 5 Encounters:  06/09/24 180 lb (81.6 kg)  06/07/24 150 lb (68 kg)  02/27/24 177 lb (80.3 kg)  02/06/24 180 lb 3.2 oz (81.7 kg)  11/05/23 182 lb (82.6 kg)     Physical Exam Vitals and nursing note reviewed.  Constitutional:      General: She is not in acute  distress.    Appearance: She is well-developed.  Cardiovascular:     Rate and Rhythm: Normal rate and regular rhythm.  Pulmonary:     Effort: Pulmonary effort is normal.     Breath sounds: Normal breath sounds.  Neurological:     Mental Status: She is alert and oriented to person, place, and time.       Assessment & Plan:   Hypothyroidism, unspecified type -     Ambulatory referral to Endocrinology     Return in about 3 months (around 09/08/2024).   Bascom GORMAN Borer, NP 06/09/2024

## 2024-06-17 ENCOUNTER — Other Ambulatory Visit: Payer: Self-pay

## 2024-06-17 ENCOUNTER — Other Ambulatory Visit: Payer: Self-pay | Admitting: Nurse Practitioner

## 2024-06-17 ENCOUNTER — Emergency Department (HOSPITAL_COMMUNITY)
Admission: EM | Admit: 2024-06-17 | Discharge: 2024-06-18 | Disposition: A | Discharge status: Home / Self Care | Attending: Emergency Medicine | Admitting: Emergency Medicine

## 2024-06-17 ENCOUNTER — Encounter (HOSPITAL_COMMUNITY): Payer: Self-pay

## 2024-06-17 ENCOUNTER — Other Ambulatory Visit: Payer: Self-pay | Admitting: Internal Medicine

## 2024-06-17 DIAGNOSIS — M791 Myalgia, unspecified site: Secondary | ICD-10-CM | POA: Diagnosis not present

## 2024-06-17 DIAGNOSIS — Z8673 Personal history of transient ischemic attack (TIA), and cerebral infarction without residual deficits: Secondary | ICD-10-CM | POA: Insufficient documentation

## 2024-06-17 DIAGNOSIS — R519 Headache, unspecified: Secondary | ICD-10-CM | POA: Insufficient documentation

## 2024-06-17 DIAGNOSIS — J45909 Unspecified asthma, uncomplicated: Secondary | ICD-10-CM | POA: Insufficient documentation

## 2024-06-17 DIAGNOSIS — Z7982 Long term (current) use of aspirin: Secondary | ICD-10-CM | POA: Diagnosis not present

## 2024-06-17 DIAGNOSIS — M2550 Pain in unspecified joint: Secondary | ICD-10-CM | POA: Insufficient documentation

## 2024-06-17 DIAGNOSIS — E039 Hypothyroidism, unspecified: Secondary | ICD-10-CM | POA: Diagnosis not present

## 2024-06-17 DIAGNOSIS — Z760 Encounter for issue of repeat prescription: Secondary | ICD-10-CM | POA: Insufficient documentation

## 2024-06-17 DIAGNOSIS — R110 Nausea: Secondary | ICD-10-CM | POA: Diagnosis not present

## 2024-06-17 LAB — CORTISOL: Cortisol, Plasma: <0.4 ug/dL

## 2024-06-17 LAB — CBC WITH DIFFERENTIAL/PLATELET
Abs Immature Granulocytes: 0.01 K/uL (ref 0.00–0.07)
Basophils Absolute: 0.1 K/uL (ref 0.0–0.1)
Basophils Relative: 1 %
Eosinophils Absolute: 0.5 K/uL (ref 0.0–0.5)
Eosinophils Relative: 7 %
HCT: 45.1 % (ref 36.0–46.0)
Hemoglobin: 14.7 g/dL (ref 12.0–15.0)
Immature Granulocytes: 0 %
Lymphocytes Relative: 56 %
Lymphs Abs: 3.8 K/uL (ref 0.7–4.0)
MCH: 29.3 pg (ref 26.0–34.0)
MCHC: 32.6 g/dL (ref 30.0–36.0)
MCV: 89.8 fL (ref 80.0–100.0)
Monocytes Absolute: 0.5 K/uL (ref 0.1–1.0)
Monocytes Relative: 8 %
Neutro Abs: 1.9 K/uL (ref 1.7–7.7)
Neutrophils Relative %: 28 %
Platelets: 492 K/uL — ABNORMAL HIGH (ref 150–400)
RBC: 5.02 MIL/uL (ref 3.87–5.11)
RDW: 13.2 % (ref 11.5–15.5)
WBC: 6.7 K/uL (ref 4.0–10.5)
nRBC: 0 % (ref 0.0–0.2)

## 2024-06-17 LAB — COMPREHENSIVE METABOLIC PANEL WITH GFR
ALT: 17 U/L (ref 0–44)
AST: 29 U/L (ref 15–41)
Albumin: 3.6 g/dL (ref 3.5–5.0)
Alkaline Phosphatase: 49 U/L (ref 38–126)
Anion gap: 13 (ref 5–15)
BUN: 9 mg/dL (ref 8–23)
CO2: 20 mmol/L — ABNORMAL LOW (ref 22–32)
Calcium: 9.5 mg/dL (ref 8.9–10.3)
Chloride: 102 mmol/L (ref 98–111)
Creatinine, Ser: 0.98 mg/dL (ref 0.44–1.00)
GFR, Estimated: >60 mL/min (ref >60)
Glucose, Bld: 109 mg/dL — ABNORMAL HIGH (ref 70–99)
Potassium: 4.1 mmol/L (ref 3.5–5.1)
Sodium: 135 mmol/L (ref 135–145)
Total Bilirubin: 0.6 mg/dL (ref 0.0–1.2)
Total Protein: 7.4 g/dL (ref 6.5–8.1)

## 2024-06-17 MED ORDER — HYDROCORTISONE SOD SUC (PF) 100 MG IJ SOLR
100.0000 mg | Freq: Once | INTRAMUSCULAR | Status: AC
  Administered 2024-06-17: 100 mg via INTRAVENOUS
  Filled 2024-06-17: qty 2

## 2024-06-17 MED ORDER — SODIUM CHLORIDE 0.9 % IV BOLUS
1000.0000 mL | Freq: Once | INTRAVENOUS | Status: AC
  Administered 2024-06-17: 1000 mL via INTRAVENOUS

## 2024-06-17 MED ORDER — ONDANSETRON HCL 4 MG/2ML IJ SOLN
4.0000 mg | Freq: Once | INTRAMUSCULAR | Status: AC
  Administered 2024-06-17: 4 mg via INTRAVENOUS
  Filled 2024-06-17: qty 2

## 2024-06-17 NOTE — ED Triage Notes (Signed)
 Pt bib EMS from home for acute on chronic joint pain. Hx of arthritis. Takes Cortisone, out for 5 days.  VSS, mild headache, mild nausea en route to ER. Nad noted in ER.

## 2024-06-17 NOTE — ED Notes (Signed)
 Pt denies any symptoms other than pain until being told that she may have to wait in the lobby for a room, at which point patient is now reporting feeling dizzy.

## 2024-06-17 NOTE — ED Provider Triage Note (Signed)
 Emergency Medicine Provider Triage Evaluation Note  [name] , a 68 y.o. female  was evaluated in triage.  Pt complains of vomiting, joint pain. Patient has hx of arthritis on hydrocortisone . Recently admitted for IV hydrocortisone  but unable to get her meds filled. Started vomiting en route to the ED, no abdominal pain   Review of Systems  Positive: Vomiting,  Negative:   Physical Exam  BP 101/79   Pulse (!) 102   Temp 98.5 F (36.9 C)   Resp 16   Ht 4' 11 (1.499 m)   Wt 81.6 kg   SpO2 97%   BMI 36.36 kg/m  Gen:   Awake, no distress   Resp:  Normal effort  MSK:   Moves extremities without difficulty  Other:    Medical Decision Making  Medically screening exam initiated at 7:36 PM.  Appropriate orders placed.  [name] was informed that the remainder of the evaluation will be completed by another provider, this initial triage assessment does not replace that evaluation, and the importance of remaining in the ED until their evaluation is complete.  [name] is a 68 y.o. female here with vomiting and joint pain. Ordered labs, cortisol level, hydrcortisone IV. Stable for lobby     Mikayla Alm Macho, MD 06/17/24 [phone]

## 2024-06-17 NOTE — Telephone Encounter (Unsigned)
 Copied from CRM [id number]. Topic: Clinical - Medication Refill >> Jun 17, 2024  9:30 AM Fonda T wrote: Medication: hydrocortisone  (CORTEF ) 10 MG tablet  Wants this to be done today, as soon as possible as patient has not taken medication in a while per son.  Has the patient contacted their pharmacy? Yes, advised patient to contact office, states pt is out of refills   This is the patient's preferred pharmacy:  [facility] - [place], [place] - [address] [place] [address] Phone: [phone] Fax: [phone]   Is this the correct pharmacy for this prescription? Yes If no, delete pharmacy and type the correct one.   Has the prescription been filled recently? Yes  Is the patient out of the medication? Yes  Has the patient been seen for an appointment in the last year OR does the patient have an upcoming appointment? Yes  Can we respond through MyChart? Yes   Agent: Please be advised that Rx refills may take up to 3 business days. We ask that you follow-up with your pharmacy.

## 2024-06-17 NOTE — Telephone Encounter (Signed)
 Please advise [facility]

## 2024-06-18 ENCOUNTER — Other Ambulatory Visit: Payer: Self-pay | Admitting: Nurse Practitioner

## 2024-06-18 DIAGNOSIS — E781 Pure hyperglyceridemia: Secondary | ICD-10-CM

## 2024-06-18 LAB — LIPASE, BLOOD: Lipase: 13 U/L (ref 11–51)

## 2024-06-18 MED ORDER — FENOFIBRATE 48 MG PO TABS: 48.0000 mg | ORAL_TABLET | Freq: Every day | ORAL | 0 refills | Status: DC

## 2024-06-18 MED ORDER — HYDROCORTISONE 10 MG PO TABS: ORAL_TABLET | ORAL | 0 refills | Status: DC

## 2024-06-18 MED ORDER — HYDROCORTISONE 10 MG PO TABS: 10.0000 mg | ORAL_TABLET | Freq: Every day | ORAL | 0 refills | Status: DC

## 2024-06-18 NOTE — ED Provider Notes (Signed)
 [place] EMERGENCY DEPARTMENT AT [facility] Provider Note  CSN: [id number] Arrival date & time: 06/17/24 1925  Chief Complaint(s) Joint Pain  HPI [name] is a 68 y.o. female with a past medical history listed below including adrenal insufficiency on Cortef  which she has been off of for the past 5 days.  She reports reaching out to the PCP for refill but has not received that yet.  Endorses increased joint pain typically felt when not on steroids.  She endorses mild nausea and headache.  No emesis.  No chest pain or shortness of breath.  No abdominal pain.  No fall or trauma.  No other physical complaints.  HPI  Past Medical History 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   Asthma    Atrial myxoma    Blindness of left eye 1978   Blood transfusion    Breast cyst    Constipation    Cushing's syndrome    CVA (cerebral infarction)    Depression    Depression    GERD (gastroesophageal reflux disease)    Glucocorticoid deficiency    Hemorrhoids    History of open heart surgery 1977   Hyperlipemia    Hypothyroidism    per office note dated 03/25/2014   Hypoxia    Migraine    Peptic ulcer associated with Helicobacter pylori infection    Reflux    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; leaving me blind   Patient Active Problem List   Diagnosis Date Noted   Abdominal pain 06/07/2024   Hypertriglyceridemia 04/02/2023   Post-adrenalectomy adrenal insufficiency 07/21/2022   Chronic bilateral low back pain with right-sided sciatica 07/13/2022   Vitamin D  deficiency 04/03/2022   Intractable episodic cluster headache 02/22/2022   History of COVID-19 02/22/2022   Morbid obesity with BMI of 40.0-44.9, adult (HCC) 02/22/2022   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   Hypersomnia with sleep apnea 02/22/2022   Psychophysiological insomnia  02/22/2022   Witnessed episode of apnea 02/22/2022   Abnormal CT of the abdomen 01/30/2022   Snoring 01/05/2022   AKI (acute kidney injury) 12/24/2021   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   NSVT (nonsustained ventricular tachycardia) (HCC) 03/08/2021   New-onset angina 12/04/2020   Chronic migraine without aura, with intractable migraine, so stated, with status migrainosus 05/25/2020   Frequent PVCs    COVID-19 10/10/2019   Hypoxia    Migraine with aura and without status migrainosus, not intractable    Tachycardia    ARF (acute renal failure) 12/06/2018   Fever 12/06/2018   Acute hypoxemic respiratory failure (HCC) 12/06/2018   Headache 11/02/2013   Dizziness 11/02/2013   Anemia 01/10/2013   Chest pain, unspecified 01/10/2013   Adjustment disorder with mixed anxiety and depressed mood 01/10/2013   Adrenal crisis 01/07/2013   Nausea & vomiting 08/03/2011   Hypotension 08/03/2011   Adrenal insufficiency 08/03/2011   Hyponatremia 08/03/2011   Hypothyroidism 08/03/2011   Depression 08/03/2011   Home Medication(s) Prior to Admission medications   Medication Sig Start Date End Date Taking? Authorizing Provider  hydrocortisone  (CORTEF ) 10 MG tablet take 15 MG (1&1/2 tablets) with Breakfast AND 10 MG (ONE tablet) between 2-4 as needed daily 06/18/24  Yes Selah Zelman, Raynell Moder, MD  aluminum -magnesium  hydroxide-simethicone  (MAALOX) 200-200-20 MG/5ML SUSP Take 30 mLs by mouth 4 (  four) times daily -  before meals and at bedtime. Patient not taking: No sig reported 11/05/23   Oley Bascom RAMAN, NP  aspirin  EC 81 MG tablet Take 81 mg by mouth daily. Swallow whole. Patient not taking: No sig reported    [provider]  fenofibrate  (TRICOR ) 48 MG tablet Take 1 tablet (48 mg total) by mouth daily. 05/21/24   Oley Bascom RAMAN, NP  Galcanezumab -gnlm (EMGALITY ) 120 MG/ML SOAJ Inject 120 mg into the skin every 30 (thirty) days. 10/25/23   Whitfield Raisin, NP  hydrocortisone  (CORTEF ) 10 MG tablet take  15 MG (1&1/2 tablets) with Breakfast AND 10 MG (ONE tablet) between 2-4 as needed daily] 08/17/23   Shamleffer, Ibtehal Jaralla, MD  hydrOXYzine  (ATARAX ) 10 MG tablet TAKE ONE TABLET BY MOUTH AT BEDTIME AS NEEDED 01/23/24   Nichols, Tonya S, NP  levothyroxine  (SYNTHROID ) 75 MCG tablet Take 1 tablet (75 mcg total) by mouth daily before breakfast. 02/06/24   Nichols, Tonya S, NP  nitroGLYCERIN  (NITROSTAT ) 0.4 MG SL tablet Place 1 tablet (0.4 mg total) under the tongue every 5 (five) minutes x 3 doses as needed for chest pain. 03/24/24   Oley Bascom RAMAN, NP  omega-3 acid ethyl esters (LOVAZA ) 1 g capsule TAKE ONE CAPSULE BY MOUTH TWICE DAILY 03/24/24   Nichols, Tonya S, NP  ondansetron  (ZOFRAN -ODT) 4 MG disintegrating tablet Take 1 tablet (4 mg total) by mouth every 8 (eight) hours as needed for nausea or vomiting. 02/15/23   Zelaya, Oscar A, PA-C  pantoprazole  (PROTONIX ) 20 MG tablet Take 1 tablet (20 mg total) by mouth daily. 06/09/24 06/09/25  Rashid, Farhan, MD  Rimegepant Sulfate (NURTEC) 75 MG TBDP Take 1 tablet (75 mg total) by mouth daily as needed. For migraines. Take as close to onset of migraine as possible. One daily maximum. 10/25/23   Whitfield Raisin, NP  rosuvastatin  (CRESTOR ) 10 MG tablet Take 1 tablet (10 mg total) by mouth daily. 02/06/24   Oley Bascom RAMAN, NP  venlafaxine  XR (EFFEXOR -XR) 150 MG 24 hr capsule Take 1 capsule (150 mg total) by mouth daily. 02/06/24 02/05/25  Oley Bascom RAMAN, NP  Vitamin D , Ergocalciferol , (DRISDOL ) 1.25 MG (50000 UNIT) CAPS capsule TAKE ONE CAPSULE BY MOUTH EVERY SEVEN DAYS 03/16/24   Nichols, Tonya S, NP                                                                                                                                    Allergies Aspirin , Ibuprofen [ibuprofen], and Penicillins  Review of Systems Review of Systems As noted in HPI  Physical Exam Vital Signs  I have reviewed the triage vital signs BP 115/63   Pulse (!) 107   Temp 98.4 F (36.9 C)    Resp 16   Ht 4' 11 (1.499 m)   Wt 81.6 kg   SpO2 99%   BMI 36.36 kg/m   Physical Exam Vitals reviewed.  Constitutional:      General: She is not in acute distress.    Appearance: She is well-developed. She is not diaphoretic.  HENT:     Head: Normocephalic and atraumatic.     Right Ear: External ear normal.     Left Ear: External ear normal.     Nose: Nose normal.  Eyes:     General: No scleral icterus.    Conjunctiva/sclera: Conjunctivae normal.  Neck:     Trachea: Phonation normal.  Cardiovascular:     Rate and Rhythm: Normal rate and regular rhythm.  Pulmonary:     Effort: Pulmonary effort is normal. No respiratory distress.     Breath sounds: No stridor.  Abdominal:     General: There is no distension.  Musculoskeletal:        General: Normal range of motion.     Cervical back: Normal range of motion.  Neurological:     Mental Status: She is alert and oriented to person, place, and time.  Psychiatric:        Behavior: Behavior normal.     ED Results and Treatments Labs (all labs ordered are listed, but only abnormal results are displayed) Labs Reviewed  CBC WITH DIFFERENTIAL/PLATELET - Abnormal; Notable for the following components:      Result Value   Platelets 492 (*)    All other components within normal limits  COMPREHENSIVE METABOLIC PANEL WITH GFR - Abnormal; Notable for the following components:   CO2 20 (*)    Glucose, Bld 109 (*)    All other components within normal limits  CORTISOL  LIPASE, BLOOD                                                                                                                         EKG  EKG Interpretation Date/Time:    Ventricular Rate:    PR Interval:    QRS Duration:    QT Interval:    QTC Calculation:   R Axis:      Text Interpretation:         Radiology No results found.  Medications Ordered in ED Medications  sodium chloride  0.9 % bolus 1,000 mL (0 mLs Intravenous Stopped 06/17/24 2131)   ondansetron  (ZOFRAN ) injection 4 mg (4 mg Intravenous Given 06/17/24 2002)  hydrocortisone  sodium succinate  (SOLU-CORTEF ) 100 MG injection 100 mg (100 mg Intravenous Given 06/17/24 2002)   Procedures Procedures  (including critical care time) Medical Decision Making / ED Course   Medical Decision Making Amount and/or Complexity of Data Reviewed Labs: ordered. Decision-making details documented in ED Course.  Risk Prescription drug management.    Polyarthralgia attributed to not having her Cortef  for the past several days.  No traumatic injuries requiring imaging.  Seen in the [facility] process and labs obtained which were grossly reassuring without leukocytosis or anemia, significant electrolyte derangements or renal sufficiency.  Solu-Cortef  and IV fluids were also ordered and given.  Patient reports feeling significantly better after  this.  Will provide prescription for short course of Cortef  until she is able to receive hers from pharmacy.    Final Clinical Impression(s) / ED Diagnoses Final diagnoses:  Polyarthralgia  Medication refill   The patient appears reasonably screened and/or stabilized for discharge and I doubt any other medical condition or other [facility] requiring further screening, evaluation, or treatment in the ED at this time. I have discussed the findings, Dx and Tx plan with the patient/family who expressed understanding and agree(s) with the plan. Discharge instructions discussed at length. The patient/family was given strict return precautions who verbalized understanding of the instructions. No further questions at time of discharge.  Disposition: Discharge  Condition: Good  ED Discharge Orders          Ordered    hydrocortisone  (CORTEF ) 10 MG tablet        06/18/24 0249              Follow Up: Oley Bascom RAMAN, NP [address] [place] [address] [phone]  Call      This chart was dictated using voice recognition software.   Despite best efforts to proofread,  errors can occur which can change the documentation meaning.    Trine Raynell Moder, MD 06/18/24 [phone]

## 2024-06-18 NOTE — Telephone Encounter (Signed)
 Copied from CRM #[id number]. Topic: Clinical - Medication Refill >> Jun 18, 2024  9:58 AM Sophia H wrote: Medication: fenofibrate  (TRICOR ) 48 MG tablet **Requesting a few fills on file if possible & full 30 day quantity, patient has been out of medication & trying to prevent this In future.  hydrocortisone  (CORTEF ) 10 MG tablet   Has the patient contacted their pharmacy? Yes, told no fills on file.   This is the patient's preferred pharmacy:  [facility] - [place], [place] - [address] [place] [address] Phone: [phone] Fax: [phone]   Is this the correct pharmacy for this prescription? Yes If no, delete pharmacy and type the correct one.   Has the prescription been filled recently? Yes  Is the patient out of the medication? Yes  Has the patient been seen for an appointment in the last year OR does the patient have an upcoming appointment? Yes, seen 09/29.  Can we respond through MyChart? Yes  Agent: Please be advised that Rx refills may take up to 3 business days. We ask that you follow-up with your pharmacy.

## 2024-07-01 ENCOUNTER — Encounter: Payer: Self-pay | Admitting: Nurse Practitioner

## 2024-07-02 ENCOUNTER — Other Ambulatory Visit: Payer: Self-pay | Admitting: Nurse Practitioner

## 2024-07-08 ENCOUNTER — Other Ambulatory Visit: Payer: Self-pay | Admitting: Nurse Practitioner

## 2024-07-08 DIAGNOSIS — E781 Pure hyperglyceridemia: Secondary | ICD-10-CM

## 2024-07-08 MED ORDER — FENOFIBRATE 48 MG PO TABS: 48.0000 mg | ORAL_TABLET | Freq: Every day | ORAL | 0 refills | Status: DC

## 2024-07-08 NOTE — Telephone Encounter (Signed)
 Copied from CRM #[id number]. Topic: Clinical - Medication Refill >> Jul 08, 2024 10:10 AM Deaijah H wrote: Medication: fenofibrate  (TRICOR ) 48 MG tablet  Has the patient contacted their pharmacy? No (Agent: If no, request that the patient contact the pharmacy for the refill. If patient does not wish to contact the pharmacy document the reason why and proceed with request.) (Agent: If yes, when and what did the pharmacy advise?)  This is the patient's preferred pharmacy:  [facility] - [place], [place] - [address] [place] [address] Phone: [phone] Fax: [phone]   Is this the correct pharmacy for this prescription? Yes If no, delete pharmacy and type the correct one.   Has the prescription been filled recently? Yes  Is the patient out of the medication? Yes  Has the patient been seen for an appointment in the last year OR does the patient have an upcoming appointment? Yes  Can we respond through MyChart? Yes  Agent: Please be advised that Rx refills may take up to 3 business days. We ask that you follow-up with your pharmacy.

## 2024-07-10 ENCOUNTER — Ambulatory Visit: Payer: Self-pay

## 2024-07-10 ENCOUNTER — Ambulatory Visit: Payer: Self-pay | Admitting: Family Medicine

## 2024-07-10 NOTE — Telephone Encounter (Signed)
 Lvm for pt son that pt will be added to [facility] schedule for 3 pm. [facility]

## 2024-07-10 NOTE — Telephone Encounter (Signed)
 FYI Only or Action Required?: FYI only for provider: Son wanted pt to be seen today.  Patient was last seen in primary care on 06/09/2024 by Oley Bascom RAMAN, NP.  Called Nurse Triage reporting Abdominal Pain and Medication Refill. - no abdominal pain at this time. Pt was unable to get one of the medications prescribed at hospital filled  Symptoms began ongoing  - hx of ulcer.  Interventions attempted: Prescription medications: from hospital visit.  Symptoms are: completely resolved.  Triage Disposition: See Today in Office  Patient/caregiver understands and will follow disposition?: Yes                 Copied from CRM #[id number]. Topic: Clinical - Red Word Triage >> Jul 10, 2024  9:01 AM Treva T wrote: [facility] that prompted transfer to Nurse Triage: Patient son, Lonni calling, DPR verified, reports patient was recently seen in hospital for abdominal pain, was prescribed medication, patient has taken medication, and is now out.  Per patient son, would like patient to be seen for an appointment as soon as possible to avoid symptoms of increased abdominal pain. Answer Assessment - Initial Assessment Questions Call from son. Pt has a history of abdominal pain. She currently does not have any pain. Pt was unable to fill one of the rx's from hospital visit. Son is worried that pt will begin to have abdominal pain if she does not have her medication. Son wants pt to be seen today - appt scheduled.     1. DRUG NAME: What medicine do you need to have refilled?     Unsure 6. SYMPTOMS: Do you have any symptoms?     Not at this time  Protocols used: Medication Refill and Renewal Call-A-AH

## 2024-07-10 NOTE — Telephone Encounter (Signed)
 Pt was seen today at different location. [name]

## 2024-07-11 ENCOUNTER — Encounter: Payer: Self-pay | Admitting: Nurse Practitioner

## 2024-07-11 ENCOUNTER — Ambulatory Visit: Payer: Self-pay | Admitting: Nurse Practitioner

## 2024-07-11 VITALS — BP 120/66 | HR 62 | Wt 174.0 lb

## 2024-07-11 DIAGNOSIS — Z23 Encounter for immunization: Secondary | ICD-10-CM | POA: Diagnosis not present

## 2024-07-11 DIAGNOSIS — M2550 Pain in unspecified joint: Secondary | ICD-10-CM | POA: Insufficient documentation

## 2024-07-11 DIAGNOSIS — E2740 Unspecified adrenocortical insufficiency: Secondary | ICD-10-CM

## 2024-07-11 DIAGNOSIS — Z09 Encounter for follow-up examination after completed treatment for conditions other than malignant neoplasm: Secondary | ICD-10-CM | POA: Insufficient documentation

## 2024-07-11 DIAGNOSIS — K219 Gastro-esophageal reflux disease without esophagitis: Secondary | ICD-10-CM | POA: Diagnosis not present

## 2024-07-11 MED ORDER — PANTOPRAZOLE SODIUM 20 MG PO TBEC: 20.0000 mg | DELAYED_RELEASE_TABLET | Freq: Every day | ORAL | 1 refills | Status: DC

## 2024-07-11 NOTE — Patient Instructions (Signed)
 1. Adrenal insufficiency (Primary)   2. Gastroesophageal reflux disease, unspecified whether esophagitis present  - pantoprazole  (PROTONIX ) 20 MG tablet; Take 1 tablet (20 mg total) by mouth daily.  Dispense: 90 tablet; Refill: 1  3. Need for influenza vaccination   It is important that you exercise regularly at least 30 minutes 5 times a week as tolerated  Think about what you will eat, plan ahead. Choose  clean, green, fresh or frozen over canned, processed or packaged foods which are more sugary, salty and fatty. 70 to 75% of food eaten should be vegetables and fruit. Three meals at set times with snacks allowed between meals, but they must be fruit or vegetables. Aim to eat over a 12 hour period , example 7 am to 7 pm, and STOP after  your last meal of the day. Drink water,generally about 64 ounces per day, no other drink is as healthy. Fruit juice is best enjoyed in a healthy way, by EATING the fruit.  Thanks for choosing [facility] we consider it a privelige to serve you.

## 2024-07-11 NOTE — Progress Notes (Signed)
 Established Patient Office Visit  Subjective:  Patient ID: [name], female    DOB: [birth year]  Age: 68 y.o. MRN: [id number]  CC:  Chief Complaint  Patient presents with   Hospitalization Follow-up    HPI   History of Present Illness [name] is a 68 year old female  has a past medical history of Adrenal hypofunction, Allergy, Amblyopia, Anemia, Arthritis, Asthma, Atrial myxoma, Blindness of left eye (1978), Blood transfusion, Breast cyst, Constipation, Cushing's syndrome, CVA (cerebral infarction), Depression, Depression, GERD (gastroesophageal reflux disease), Glucocorticoid deficiency, Hemorrhoids, History of open heart surgery (1977), Hyperlipemia, Hypothyroidism, Hypoxia, Migraine, Peptic ulcer associated with Helicobacter pylori infection, Reflux, and Stroke (HCC) (1978).  who presents for follow-up after an emergency room visit on 06/17/2024 for polyarthralgia.   She has run out of her hydrocortisone  tablets, which were refilled during her emergency room visit. She takes 10 mg of hydrocortisone  daily. Additionally, she requires a refill of her acid reflux medication, which she takes daily.  Her joint pain has improved since her emergency room visit on October 7th. She experiences no nausea, headache, vomiting, shortness of breath, or abdominal pain.    Assessment & Plan       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   Asthma    Atrial myxoma    Blindness of left eye 1978   Blood transfusion    Breast cyst    Constipation    Cushing's syndrome    CVA (cerebral infarction)    Depression    Depression    GERD (gastroesophageal reflux disease)    Glucocorticoid deficiency    Hemorrhoids    History of open heart surgery 1977   Hyperlipemia    Hypothyroidism    per office note dated 03/25/2014   Hypoxia    Migraine    Peptic ulcer associated with  Helicobacter pylori infection    Reflux    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; leaving me blind    Past Surgical History:  Procedure Laterality Date   ADRENAL GLAND SURGERY  09/11/1980   on hydrocortisone    ATRIAL MYXOMA EXCISION  09/11/1976   BREAST BIOPSY  07/13/1999   ductal tissue excision & bx right; bx only left breast   COLONOSCOPY     EYE SURGERY     LEFT HEART CATHETERIZATION WITH CORONARY ANGIOGRAM N/A 10/06/2011   Procedure: LEFT HEART CATHETERIZATION WITH CORONARY ANGIOGRAM;  Surgeon: Rober LOISE Chroman, MD;  Location: [facility];  Service: Cardiovascular;  Laterality: N/A;   ORBITOTOMY Right 07/27/2021   POLYPECTOMY     repaired cross eyed     as a child   ROTATOR CUFF REPAIR  05/12/2005   left   VAGINAL HYSTERECTOMY  09/11/1998   partial; w/left ovary    Family History  Problem Relation Age of Onset   Acromegaly Father    Heart disease Father        Tumor in the heart   Stomach cancer Father    Colon cancer Sister 51   Diabetes Sister    Kidney disease Sister        x2   Heart disease Sister        Tumor in the heart   Heart disease Daughter        Tumor  in the heart   Colon polyps Neg Hx    Esophageal cancer Neg Hx    Gallbladder disease Neg Hx    Migraines Neg Hx    Rectal cancer Neg Hx     Social History   Socioeconomic History   Marital status: Married    Spouse name: Nadara   Number of children: 2   Years of education: Not on file   Highest education level: Some college, no degree  Occupational History   Occupation: Disabled  Tobacco Use   Smoking status: Never   Smokeless tobacco: Never  Vaping Use   Vaping status: Never Used  Substance and Sexual Activity   Alcohol use: No   Drug use: No   Sexual activity: Not Currently  Other Topics Concern   Not on file  Social History Narrative   Lives w husband and son   Left handed   Caffeine : none    Social Drivers of Corporate Investment Banker Strain:  Not on file  Food Insecurity: No Food Insecurity (06/09/2024)   Hunger Vital Sign    Worried About Running Out of Food in the Last Year: Never true    Ran Out of Food in the Last Year: Never true  Transportation Needs: Patient Declined (06/09/2024)   PRAPARE - Administrator, Civil Service (Medical): Patient declined    Lack of Transportation (Non-Medical): Patient declined  Physical Activity: Not on file  Stress: Not on file  Social Connections: Patient Declined (06/09/2024)   Social Connection and Isolation Panel    Frequency of Communication with Friends and Family: Patient declined    Frequency of Social Gatherings with Friends and Family: Patient declined    Attends Religious Services: Patient declined    Database Administrator or Organizations: Patient declined    Attends Banker Meetings: Patient declined    Marital Status: Patient declined  Intimate Partner Violence: Patient Declined (06/09/2024)   Humiliation, Afraid, Rape, and Kick questionnaire    Fear of Current or Ex-Partner: Patient declined    Emotionally Abused: Patient declined    Physically Abused: Patient declined    Sexually Abused: Patient declined    Outpatient Medications Prior to Visit  Medication Sig Dispense Refill   aspirin  EC 81 MG tablet Take 81 mg by mouth daily. Swallow whole.     fenofibrate  (TRICOR ) 48 MG tablet Take 1 tablet (48 mg total) by mouth daily. 30 tablet 0   Galcanezumab -gnlm (EMGALITY ) 120 MG/ML SOAJ Inject 120 mg into the skin every 30 (thirty) days. 1 mL 11   hydrocortisone  (CORTEF ) 10 MG tablet Take 1 tablet (10 mg total) by mouth daily. 90 tablet 0   hydrocortisone  (CORTEF ) 10 MG tablet take 15 MG (1&1/2 tablets) with Breakfast AND 10 MG (ONE tablet) between 2-4 as needed daily 15 tablet 0   levothyroxine  (SYNTHROID ) 75 MCG tablet Take 1 tablet (75 mcg total) by mouth daily before breakfast. 30 tablet 2   nitroGLYCERIN  (NITROSTAT ) 0.4 MG SL tablet Place 1 tablet  (0.4 mg total) under the tongue every 5 (five) minutes x 3 doses as needed for chest pain. 25 tablet 0   omega-3 acid ethyl esters (LOVAZA ) 1 g capsule TAKE ONE CAPSULE BY MOUTH TWICE DAILY 60 capsule 3   ondansetron  (ZOFRAN -ODT) 4 MG disintegrating tablet Take 1 tablet (4 mg total) by mouth every 8 (eight) hours as needed for nausea or vomiting. 20 tablet 0   Rimegepant Sulfate (NURTEC) 75 MG TBDP  Take 1 tablet (75 mg total) by mouth daily as needed. For migraines. Take as close to onset of migraine as possible. One daily maximum. 8 tablet 11   rosuvastatin  (CRESTOR ) 10 MG tablet Take 1 tablet (10 mg total) by mouth daily. 30 tablet 11   venlafaxine  XR (EFFEXOR -XR) 150 MG 24 hr capsule Take 1 capsule (150 mg total) by mouth daily. 30 capsule 11   Vitamin D , Ergocalciferol , (DRISDOL ) 1.25 MG (50000 UNIT) CAPS capsule TAKE ONE CAPSULE BY MOUTH EVERY SEVEN DAYS 5 capsule 5   pantoprazole  (PROTONIX ) 20 MG tablet Take 1 tablet (20 mg total) by mouth daily. 15 tablet 0   aluminum -magnesium  hydroxide-simethicone  (MAALOX) 200-200-20 MG/5ML SUSP Take 30 mLs by mouth 4 (four) times daily -  before meals and at bedtime. (Patient not taking: No sig reported) 355 mL 1   hydrOXYzine  (ATARAX ) 10 MG tablet TAKE ONE TABLET BY MOUTH AT BEDTIME AS NEEDED 30 tablet 0   No facility-administered medications prior to visit.    Allergies  Allergen Reactions   Aspirin  Other (See Comments)    Can take baby aspirin  (heart flutters with high doses)   Ibuprofen [Ibuprofen] Nausea And Vomiting   Penicillins Nausea And Vomiting    Has patient had a PCN reaction causing immediate rash, facial/tongue/throat swelling, SOB or lightheadedness with hypotension: Yes Has patient had a PCN reaction causing severe rash involving mucus membranes or skin necrosis: No Has patient had a PCN reaction that required hospitalization No Has patient had a PCN reaction occurring within the last 10 years: No If all of the above answers are  NO, then may proceed with Cephalosporin use.     ROS Review of Systems  Constitutional:  Negative for appetite change, chills, fatigue and fever.  HENT:  Negative for congestion, postnasal drip, rhinorrhea and sneezing.   Respiratory:  Negative for cough, shortness of breath and wheezing.   Cardiovascular:  Negative for chest pain, palpitations and leg swelling.  Gastrointestinal:  Negative for abdominal pain, constipation, nausea and vomiting.  Genitourinary:  Negative for difficulty urinating, dysuria, flank pain and frequency.  Musculoskeletal:  Negative for arthralgias, back pain, joint swelling and myalgias.  Skin:  Negative for color change, pallor, rash and wound.  Neurological:  Negative for dizziness, facial asymmetry, weakness, numbness and headaches.  Psychiatric/Behavioral:  Negative for behavioral problems, confusion, self-injury and suicidal ideas.       Objective:    Physical Exam Vitals and nursing note reviewed.  Constitutional:      General: She is not in acute distress.    Appearance: Normal appearance. She is obese. She is not ill-appearing, toxic-appearing or diaphoretic.  Cardiovascular:     Rate and Rhythm: Normal rate and regular rhythm.     Pulses: Normal pulses.     Heart sounds: Normal heart sounds. No murmur heard.    No friction rub. No gallop.  Pulmonary:     Effort: Pulmonary effort is normal. No respiratory distress.     Breath sounds: Normal breath sounds. No stridor. No wheezing, rhonchi or rales.  Chest:     Chest wall: No tenderness.  Abdominal:     General: There is no distension.     Palpations: Abdomen is soft.     Tenderness: There is no abdominal tenderness. There is no right CVA tenderness, left CVA tenderness or guarding.  Musculoskeletal:        General: No swelling, tenderness, deformity or signs of injury.     Right lower leg:  No edema.     Left lower leg: No edema.  Skin:    General: Skin is warm and dry.     Capillary  Refill: Capillary refill takes less than 2 seconds.     Coloration: Skin is not jaundiced or pale.     Findings: No bruising, erythema or lesion.  Neurological:     Mental Status: She is alert and oriented to person, place, and time.     Motor: No weakness.     Gait: Gait normal.  Psychiatric:        Mood and Affect: Mood normal.        Behavior: Behavior normal.        Thought Content: Thought content normal.        Judgment: Judgment normal.     BP 120/66   Pulse 62   Wt 174 lb (78.9 kg)   SpO2 94%   BMI 35.14 kg/m  Wt Readings from Last 3 Encounters:  07/11/24 174 lb (78.9 kg)  06/17/24 180 lb (81.6 kg)  06/09/24 180 lb (81.6 kg)    Lab Results  Component Value Date   TSH 1.640 02/06/2024   Lab Results  Component Value Date   WBC 6.7 06/17/2024   HGB 14.7 06/17/2024   HCT 45.1 06/17/2024   MCV 89.8 06/17/2024   PLT 492 (H) 06/17/2024   Lab Results  Component Value Date   NA 135 06/17/2024   K 4.1 06/17/2024   CO2 20 (L) 06/17/2024   GLUCOSE 109 (H) 06/17/2024   BUN 9 06/17/2024   CREATININE 0.98 06/17/2024   BILITOT 0.6 06/17/2024   ALKPHOS 49 06/17/2024   AST 29 06/17/2024   ALT 17 06/17/2024   PROT 7.4 06/17/2024   ALBUMIN 3.6 06/17/2024   CALCIUM  9.5 06/17/2024   ANIONGAP 13 06/17/2024   EGFR 60 02/06/2024   Lab Results  Component Value Date   CHOL 101 11/05/2023   Lab Results  Component Value Date   HDL 42 11/05/2023   Lab Results  Component Value Date   LDLCALC 39 11/05/2023   Lab Results  Component Value Date   TRIG 111 11/05/2023   Lab Results  Component Value Date   CHOLHDL 2.4 11/05/2023   No results found for: HGBA1C    Assessment & Plan:   Problem List Items Addressed This Visit       Digestive   GERD (gastroesophageal reflux disease)   Pantoprazole  20 mg daily refilled      Relevant Medications   pantoprazole  (PROTONIX ) 20 MG tablet     Endocrine   Adrenal insufficiency   Continue hydrocortisone  10 mg  tablet She has been referred to endocrinologist by PCP        Other   Need for influenza vaccination   Relevant Orders   Flu vaccine HIGH DOSE PF(Fluzone Trivalent) (Completed)   Encounter for examination following treatment at hospital   Hospital chart reviewed, including discharge summary Medications reconciled and reviewed with the patient in detail       Polyarthralgia - Primary   Now under control       Meds ordered this encounter  Medications   pantoprazole  (PROTONIX ) 20 MG tablet    Sig: Take 1 tablet (20 mg total) by mouth daily.    Dispense:  90 tablet    Refill:  1    Follow-up: Return in about 2 months (around 09/10/2024), or adrenal insufficinecy.    Durant Scibilia R Phallon Haydu, FNP

## 2024-07-11 NOTE — Assessment & Plan Note (Signed)
 Hospital chart reviewed, including discharge summary Medications reconciled and reviewed with the patient in detail

## 2024-07-11 NOTE — Assessment & Plan Note (Signed)
 Pantoprazole  20 mg daily refilled

## 2024-07-11 NOTE — Assessment & Plan Note (Signed)
 Now under control

## 2024-07-11 NOTE — Assessment & Plan Note (Signed)
 Continue hydrocortisone  10 mg tablet She has been referred to endocrinologist by PCP

## 2024-07-18 ENCOUNTER — Ambulatory Visit: Payer: Self-pay | Admitting: Nurse Practitioner

## 2024-07-20 DIAGNOSIS — F324 Major depressive disorder, single episode, in partial remission: Secondary | ICD-10-CM | POA: Diagnosis not present

## 2024-07-20 DIAGNOSIS — E669 Obesity, unspecified: Secondary | ICD-10-CM | POA: Diagnosis not present

## 2024-07-20 DIAGNOSIS — H9192 Unspecified hearing loss, left ear: Secondary | ICD-10-CM | POA: Diagnosis not present

## 2024-07-20 DIAGNOSIS — Z8673 Personal history of transient ischemic attack (TIA), and cerebral infarction without residual deficits: Secondary | ICD-10-CM | POA: Diagnosis not present

## 2024-07-20 DIAGNOSIS — Z79890 Hormone replacement therapy (postmenopausal): Secondary | ICD-10-CM | POA: Diagnosis not present

## 2024-07-20 DIAGNOSIS — Z818 Family history of other mental and behavioral disorders: Secondary | ICD-10-CM | POA: Diagnosis not present

## 2024-07-20 DIAGNOSIS — G43909 Migraine, unspecified, not intractable, without status migrainosus: Secondary | ICD-10-CM | POA: Diagnosis not present

## 2024-07-20 DIAGNOSIS — K219 Gastro-esophageal reflux disease without esophagitis: Secondary | ICD-10-CM | POA: Diagnosis not present

## 2024-07-20 DIAGNOSIS — Z5982 Transportation insecurity: Secondary | ICD-10-CM | POA: Diagnosis not present

## 2024-07-20 DIAGNOSIS — E039 Hypothyroidism, unspecified: Secondary | ICD-10-CM | POA: Diagnosis not present

## 2024-07-20 DIAGNOSIS — E785 Hyperlipidemia, unspecified: Secondary | ICD-10-CM | POA: Diagnosis not present

## 2024-07-20 DIAGNOSIS — M329 Systemic lupus erythematosus, unspecified: Secondary | ICD-10-CM | POA: Diagnosis not present

## 2024-07-20 DIAGNOSIS — Z6833 Body mass index (BMI) 33.0-33.9, adult: Secondary | ICD-10-CM | POA: Diagnosis not present

## 2024-07-20 DIAGNOSIS — I209 Angina pectoris, unspecified: Secondary | ICD-10-CM | POA: Diagnosis not present

## 2024-07-20 DIAGNOSIS — Z833 Family history of diabetes mellitus: Secondary | ICD-10-CM | POA: Diagnosis not present

## 2024-07-20 DIAGNOSIS — F419 Anxiety disorder, unspecified: Secondary | ICD-10-CM | POA: Diagnosis not present

## 2024-07-20 DIAGNOSIS — Z8249 Family history of ischemic heart disease and other diseases of the circulatory system: Secondary | ICD-10-CM | POA: Diagnosis not present

## 2024-07-23 ENCOUNTER — Other Ambulatory Visit: Payer: Self-pay | Admitting: Nurse Practitioner

## 2024-07-23 DIAGNOSIS — E781 Pure hyperglyceridemia: Secondary | ICD-10-CM

## 2024-08-19 ENCOUNTER — Other Ambulatory Visit: Payer: Self-pay | Admitting: Nurse Practitioner

## 2024-09-01 ENCOUNTER — Other Ambulatory Visit: Payer: Self-pay | Admitting: Nurse Practitioner

## 2024-09-01 DIAGNOSIS — E781 Pure hyperglyceridemia: Secondary | ICD-10-CM

## 2024-09-01 NOTE — Telephone Encounter (Signed)
 Copied from CRM #[id number]. Topic: Clinical - Medication Refill >> Sep 01, 2024 11:14 AM Tobias L wrote: Medication: fenofibrate  (TRICOR ) 48 MG tablet Patient completely out, has been out for a week. Requesting for additional refills to be included in Rx.   Has the patient contacted their pharmacy? Yes   This is the patient's preferred pharmacy:  [facility] - [place], [place] - [address] [place] [address] Phone: [phone] Fax: [phone]  Is this the correct pharmacy for this prescription? Yes  Has the prescription been filled recently? No  Is the patient out of the medication? Yes  Has the patient been seen for an appointment in the last year OR does the patient have an upcoming appointment? Yes  Can we respond through MyChart? No  Agent: Please be advised that Rx refills may take up to 3 business days. We ask that you follow-up with your pharmacy.

## 2024-09-02 MED ORDER — FENOFIBRATE 48 MG PO TABS: 48.0000 mg | ORAL_TABLET | Freq: Every day | ORAL | 0 refills | Status: AC

## 2024-09-02 NOTE — Addendum Note (Signed)
 Addended by: OLEY BASCOM RAMAN on: 09/02/2024 05:11 PM   Modules accepted: Orders

## 2024-09-08 ENCOUNTER — Ambulatory Visit: Payer: Self-pay | Admitting: Nurse Practitioner

## 2024-09-17 ENCOUNTER — Ambulatory Visit: Payer: Self-pay | Admitting: Nurse Practitioner

## 2024-09-18 ENCOUNTER — Ambulatory Visit: Admitting: Nurse Practitioner

## 2024-09-24 ENCOUNTER — Other Ambulatory Visit: Payer: Self-pay | Admitting: Nurse Practitioner

## 2024-09-24 NOTE — Telephone Encounter (Signed)
 levothyroxine  (SYNTHROID ) 75 MCG tablet [Pharmacy Med Name: levothyroxine  75 mcg tablet]

## 2024-10-08 ENCOUNTER — Other Ambulatory Visit: Payer: Self-pay | Admitting: Nurse Practitioner

## 2024-10-08 DIAGNOSIS — E559 Vitamin D deficiency, unspecified: Secondary | ICD-10-CM

## 2024-10-08 DIAGNOSIS — K219 Gastro-esophageal reflux disease without esophagitis: Secondary | ICD-10-CM

## 2024-10-08 NOTE — Telephone Encounter (Signed)
 Please advise [facility]

## 2024-10-08 NOTE — Telephone Encounter (Signed)
 pantoprazole  (PROTONIX ) 20 MG tablet [Pharmacy Med Name: pantoprazole  20 mg tablet,delayed release]

## 2024-10-08 NOTE — Telephone Encounter (Signed)
 Vitamin D , Ergocalciferol , (DRISDOL ) 1.25 MG (50000 UNIT) CAPS capsule [Pharmacy Med Name: ergocalciferol  (vitamin D2) 1,250 mcg (50,000 unit) capsule]

## 2024-10-10 ENCOUNTER — Ambulatory Visit: Admitting: Nurse Practitioner

## 2024-10-15 ENCOUNTER — Encounter (HOSPITAL_COMMUNITY): Payer: Self-pay | Admitting: *Deleted

## 2024-10-15 ENCOUNTER — Other Ambulatory Visit: Payer: Self-pay

## 2024-10-15 ENCOUNTER — Observation Stay (HOSPITAL_COMMUNITY)
Admission: EM | Admit: 2024-10-15 | Discharge: 2024-10-17 | Disposition: A | Source: Home / Self Care | Attending: Emergency Medicine | Admitting: Emergency Medicine

## 2024-10-15 ENCOUNTER — Emergency Department (HOSPITAL_COMMUNITY)

## 2024-10-15 DIAGNOSIS — E2740 Unspecified adrenocortical insufficiency: Secondary | ICD-10-CM | POA: Diagnosis present

## 2024-10-15 DIAGNOSIS — R7981 Abnormal blood-gas level: Secondary | ICD-10-CM | POA: Diagnosis present

## 2024-10-15 DIAGNOSIS — K219 Gastro-esophageal reflux disease without esophagitis: Secondary | ICD-10-CM | POA: Diagnosis present

## 2024-10-15 DIAGNOSIS — I493 Ventricular premature depolarization: Secondary | ICD-10-CM | POA: Diagnosis present

## 2024-10-15 DIAGNOSIS — R8290 Unspecified abnormal findings in urine: Secondary | ICD-10-CM | POA: Diagnosis present

## 2024-10-15 DIAGNOSIS — E039 Hypothyroidism, unspecified: Secondary | ICD-10-CM | POA: Diagnosis present

## 2024-10-15 DIAGNOSIS — E272 Addisonian crisis: Secondary | ICD-10-CM | POA: Diagnosis present

## 2024-10-15 DIAGNOSIS — I959 Hypotension, unspecified: Secondary | ICD-10-CM | POA: Diagnosis present

## 2024-10-15 DIAGNOSIS — R112 Nausea with vomiting, unspecified: Principal | ICD-10-CM | POA: Diagnosis present

## 2024-10-15 DIAGNOSIS — I6930 Unspecified sequelae of cerebral infarction: Secondary | ICD-10-CM

## 2024-10-15 DIAGNOSIS — R531 Weakness: Secondary | ICD-10-CM

## 2024-10-15 DIAGNOSIS — R197 Diarrhea, unspecified: Secondary | ICD-10-CM | POA: Diagnosis present

## 2024-10-15 DIAGNOSIS — R6510 Systemic inflammatory response syndrome (SIRS) of non-infectious origin without acute organ dysfunction: Secondary | ICD-10-CM | POA: Diagnosis present

## 2024-10-15 DIAGNOSIS — E860 Dehydration: Secondary | ICD-10-CM

## 2024-10-15 DIAGNOSIS — F32A Depression, unspecified: Secondary | ICD-10-CM | POA: Diagnosis present

## 2024-10-15 LAB — CBC
HCT: 44.7 % (ref 36.0–46.0)
Hemoglobin: 14.8 g/dL (ref 12.0–15.0)
MCH: 30 pg (ref 26.0–34.0)
MCHC: 33.1 g/dL (ref 30.0–36.0)
MCV: 90.7 fL (ref 80.0–100.0)
Platelets: 410 10*3/uL — ABNORMAL HIGH (ref 150–400)
RBC: 4.93 MIL/uL (ref 3.87–5.11)
RDW: 12.9 % (ref 11.5–15.5)
WBC: 7.1 10*3/uL (ref 4.0–10.5)
nRBC: 0 % (ref 0.0–0.2)

## 2024-10-15 LAB — URINALYSIS, W/ REFLEX TO CULTURE (INFECTION SUSPECTED)
Bacteria, UA: NONE SEEN
Bilirubin Urine: NEGATIVE
Glucose, UA: NEGATIVE mg/dL
Hgb urine dipstick: NEGATIVE
Ketones, ur: NEGATIVE mg/dL
Nitrite: NEGATIVE
Protein, ur: 30 mg/dL — AB
Specific Gravity, Urine: 1.023 (ref 1.005–1.030)
WBC, UA: >50 WBC/hpf (ref 0–5)
pH: 5 (ref 5.0–8.0)

## 2024-10-15 LAB — COMPREHENSIVE METABOLIC PANEL WITH GFR
ALT: 16 U/L (ref 0–44)
AST: 37 U/L (ref 15–41)
Albumin: 4 g/dL (ref 3.5–5.0)
Alkaline Phosphatase: 61 U/L (ref 38–126)
Anion gap: 13 (ref 5–15)
BUN: 20 mg/dL (ref 8–23)
CO2: 25 mmol/L (ref 22–32)
Calcium: 10.8 mg/dL — ABNORMAL HIGH (ref 8.9–10.3)
Chloride: 94 mmol/L — ABNORMAL LOW (ref 98–111)
Creatinine, Ser: 1.15 mg/dL — ABNORMAL HIGH (ref 0.44–1.00)
GFR, Estimated: 52 mL/min — ABNORMAL LOW
Glucose, Bld: 98 mg/dL (ref 70–99)
Potassium: 4.9 mmol/L (ref 3.5–5.1)
Sodium: 133 mmol/L — ABNORMAL LOW (ref 135–145)
Total Bilirubin: 0.5 mg/dL (ref 0.0–1.2)
Total Protein: 8.1 g/dL (ref 6.5–8.1)

## 2024-10-15 LAB — TROPONIN T, HIGH SENSITIVITY
Troponin T High Sensitivity: 7 ng/L (ref 0–19)
Troponin T High Sensitivity: 7 ng/L (ref 0–19)

## 2024-10-15 LAB — RESP PANEL BY RT-PCR (RSV, FLU A&B, COVID)  RVPGX2
Influenza A by PCR: NEGATIVE
Influenza B by PCR: NEGATIVE
Resp Syncytial Virus by PCR: NEGATIVE
SARS Coronavirus 2 by RT PCR: NEGATIVE

## 2024-10-15 LAB — CREATININE, SERUM
Creatinine, Ser: 1.02 mg/dL — ABNORMAL HIGH (ref 0.44–1.00)
GFR, Estimated: >60 mL/min

## 2024-10-15 LAB — CK: Total CK: 197 U/L (ref 38–234)

## 2024-10-15 LAB — D-DIMER, QUANTITATIVE: D-Dimer, Quant: 0.9 ug{FEU}/mL — ABNORMAL HIGH (ref 0.00–0.50)

## 2024-10-15 LAB — GLUCOSE, CAPILLARY
Glucose-Capillary: 142 mg/dL — ABNORMAL HIGH (ref 70–99)
Glucose-Capillary: 188 mg/dL — ABNORMAL HIGH (ref 70–99)

## 2024-10-15 LAB — CBG MONITORING, ED: Glucose-Capillary: 63 mg/dL — ABNORMAL LOW (ref 70–99)

## 2024-10-15 LAB — LIPASE, BLOOD: Lipase: 15 U/L (ref 11–51)

## 2024-10-15 LAB — MAGNESIUM: Magnesium: 1.5 mg/dL — ABNORMAL LOW (ref 1.7–2.4)

## 2024-10-15 MED ORDER — LACTATED RINGERS IV BOLUS
1000.0000 mL | Freq: Once | INTRAVENOUS | Status: AC
  Administered 2024-10-15: 1000 mL via INTRAVENOUS

## 2024-10-15 MED ORDER — NITROGLYCERIN 0.4 MG SL SUBL: 0.4000 mg | SUBLINGUAL_TABLET | SUBLINGUAL | Status: DC | PRN

## 2024-10-15 MED ORDER — LACTATED RINGERS IV BOLUS (SEPSIS)
1000.0000 mL | Freq: Once | INTRAVENOUS | Status: AC
  Administered 2024-10-15: 1000 mL via INTRAVENOUS

## 2024-10-15 MED ORDER — ONDANSETRON HCL 4 MG/2ML IJ SOLN
4.0000 mg | Freq: Four times a day (QID) | INTRAMUSCULAR | Status: DC | PRN
  Administered 2024-10-15: 4 mg via INTRAVENOUS
  Filled 2024-10-15: qty 2

## 2024-10-15 MED ORDER — ACETAMINOPHEN 325 MG PO TABS: 650.0000 mg | ORAL_TABLET | Freq: Four times a day (QID) | ORAL | Status: DC | PRN

## 2024-10-15 MED ORDER — SODIUM CHLORIDE 0.9 % IV SOLN
1.0000 g | INTRAVENOUS | Status: DC
  Administered 2024-10-15 – 2024-10-16 (×2): 1 g via INTRAVENOUS
  Filled 2024-10-15 (×2): qty 10

## 2024-10-15 MED ORDER — ACETAMINOPHEN 325 MG PO TABS
650.0000 mg | ORAL_TABLET | Freq: Once | ORAL | Status: AC
  Administered 2024-10-15: 650 mg via ORAL
  Filled 2024-10-15: qty 2

## 2024-10-15 MED ORDER — ENOXAPARIN SODIUM 40 MG/0.4ML IJ SOSY
40.0000 mg | PREFILLED_SYRINGE | INTRAMUSCULAR | Status: DC
  Administered 2024-10-15 – 2024-10-16 (×2): 40 mg via SUBCUTANEOUS
  Filled 2024-10-15 (×2): qty 0.4

## 2024-10-15 MED ORDER — PANTOPRAZOLE SODIUM 40 MG IV SOLR
40.0000 mg | Freq: Two times a day (BID) | INTRAVENOUS | Status: DC
  Administered 2024-10-15 – 2024-10-17 (×5): 40 mg via INTRAVENOUS
  Filled 2024-10-15 (×5): qty 10

## 2024-10-15 MED ORDER — HYDROCORTISONE SOD SUC (PF) 100 MG IJ SOLR
100.0000 mg | Freq: Once | INTRAMUSCULAR | Status: AC
  Administered 2024-10-15: 100 mg via INTRAVENOUS
  Filled 2024-10-15: qty 2

## 2024-10-15 MED ORDER — NAPHAZOLINE-PHENIRAMINE 0.025-0.3 % OP SOLN: 1.0000 [drp] | Freq: Four times a day (QID) | OPHTHALMIC | Status: DC | PRN

## 2024-10-15 MED ORDER — PROCHLORPERAZINE EDISYLATE 10 MG/2ML IJ SOLN: 10.0000 mg | Freq: Four times a day (QID) | INTRAMUSCULAR | Status: DC | PRN

## 2024-10-15 MED ORDER — OMEGA-3-ACID ETHYL ESTERS 1 G PO CAPS
1.0000 | ORAL_CAPSULE | Freq: Two times a day (BID) | ORAL | Status: DC
  Administered 2024-10-15 – 2024-10-17 (×5): 1 g via ORAL
  Filled 2024-10-15 (×5): qty 1

## 2024-10-15 MED ORDER — HYDROCORTISONE SOD SUC (PF) 100 MG IJ SOLR
50.0000 mg | Freq: Four times a day (QID) | INTRAMUSCULAR | Status: DC
  Administered 2024-10-15 – 2024-10-17 (×8): 50 mg via INTRAVENOUS
  Filled 2024-10-15 (×8): qty 1

## 2024-10-15 MED ORDER — LEVOTHYROXINE SODIUM 50 MCG PO TABS
75.0000 ug | ORAL_TABLET | Freq: Every day | ORAL | Status: DC
  Administered 2024-10-16 – 2024-10-17 (×2): 75 ug via ORAL
  Filled 2024-10-15 (×2): qty 1

## 2024-10-15 MED ORDER — ALBUTEROL SULFATE (2.5 MG/3ML) 0.083% IN NEBU: 2.5000 mg | INHALATION_SOLUTION | Freq: Four times a day (QID) | RESPIRATORY_TRACT | Status: DC | PRN

## 2024-10-15 MED ORDER — ONDANSETRON 4 MG PO TBDP: 4.0000 mg | ORAL_TABLET | Freq: Three times a day (TID) | ORAL | 0 refills | Status: AC | PRN

## 2024-10-15 MED ORDER — DEXTROSE-SODIUM CHLORIDE 5-0.9 % IV SOLN: INTRAVENOUS | Status: AC

## 2024-10-15 MED ORDER — MAGNESIUM SULFATE 2 GM/50ML IV SOLN
2.0000 g | Freq: Once | INTRAVENOUS | Status: AC
  Administered 2024-10-15: 2 g via INTRAVENOUS
  Filled 2024-10-15: qty 50

## 2024-10-15 MED ORDER — GUAIFENESIN 100 MG/5ML PO LIQD: 5.0000 mL | ORAL | Status: DC | PRN

## 2024-10-15 MED ORDER — HYDROXYZINE HCL 10 MG PO TABS: 10.0000 mg | ORAL_TABLET | Freq: Every evening | ORAL | Status: DC | PRN

## 2024-10-15 MED ORDER — ACETAMINOPHEN 650 MG RE SUPP: 650.0000 mg | Freq: Four times a day (QID) | RECTAL | Status: DC | PRN

## 2024-10-15 NOTE — Assessment & Plan Note (Signed)
 ED RN reported patient's O2 sat dropped to low 80's at one point in the setting of ambulatory trial with concurrent hypotension and tachycardia, improved on 2 L/min [place] O2.  CXR was negative.  Pt reports a cough related to dry throat, otherwise denies respiratory symptoms. --Supplement O2 PRN, maintain spO2 > 92%, wean as tolerated --Incentive spirometer --Check d-dimer

## 2024-10-15 NOTE — ED Provider Notes (Addendum)
 " [facility] AT [facility] Provider Note   CSN: [id number] Arrival date & time: 10/15/24  9657     Patient presents with: Emesis   MARCI POLITO is a 69 y.o. female.   HPI Patient presents for multiple complaints.  Medical history includes adrenal insufficiency, depression, anemia, migraines, GERD, PUD, HLD, asthma, CVA.  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  For the past 4 days, she has had decreased appetite, decreased p.o. intake, generalized weakness, diffuse myalgias, nonbloody diarrhea, nausea, and vomiting.  She arrives via EMS due to worsening symptoms.  EMS noted hypotension prior to arrival.    Prior to Admission medications  Medication Sig Start Date End Date Taking? Authorizing Provider  aluminum -magnesium  hydroxide-simethicone  (MAALOX) 200-200-20 MG/5ML SUSP Take 30 mLs by mouth 4 (four) times daily -  before meals and at bedtime. Patient not taking: No sig reported 11/05/23   Oley Bascom RAMAN, NP  aspirin  EC 81 MG tablet Take 81 mg by mouth daily. Swallow whole.    [provider]  fenofibrate  (TRICOR ) 48 MG tablet Take 1 tablet (48 mg total) by mouth daily. 09/02/24   Oley Bascom RAMAN, NP  Galcanezumab -gnlm (EMGALITY ) 120 MG/ML SOAJ Inject 120 mg into the skin every 30 (thirty) days. 10/25/23   Whitfield Raisin, NP  hydrocortisone  (CORTEF ) 10 MG tablet Take 1 tablet (10 mg total) by mouth daily. 06/18/24   Oley Bascom RAMAN, NP  hydrocortisone  (CORTEF ) 10 MG tablet take 15 MG (1&1/2 tablets) with Breakfast AND 10 MG (ONE tablet) between 2-4 as needed daily 06/18/24   Cardama, Raynell Moder, MD  hydrOXYzine  (ATARAX ) 10 MG tablet TAKE ONE TABLET BY MOUTH AT BEDTIME AS NEEDED 01/23/24   Nichols, Tonya S, NP  levothyroxine  (SYNTHROID ) 75 MCG tablet Take 1 tablet (75 mcg total) by mouth daily before breakfast. 09/24/24   Nichols, Tonya S, NP  nitroGLYCERIN  (NITROSTAT ) 0.4 MG SL tablet Place 1 tablet (0.4 mg total) under the tongue every 5  (five) minutes as needed for chest pain 07/23/24   Oley Bascom RAMAN, NP  omega-3 acid ethyl esters (LOVAZA ) 1 g capsule TAKE ONE CAPSULE BY MOUTH TWICE DAILY 07/23/24   Nichols, Tonya S, NP  ondansetron  (ZOFRAN -ODT) 4 MG disintegrating tablet Take 1 tablet (4 mg total) by mouth every 8 (eight) hours as needed for nausea or vomiting. 10/15/24   Melvenia Motto, MD  pantoprazole  (PROTONIX ) 20 MG tablet Take 1 tablet (20 mg total) by mouth daily. 10/08/24   Paseda, Folashade R, FNP  Rimegepant Sulfate (NURTEC) 75 MG TBDP Take 1 tablet (75 mg total) by mouth daily as needed. For migraines. Take as close to onset of migraine as possible. One daily maximum. 10/25/23   Whitfield Raisin, NP  rosuvastatin  (CRESTOR ) 10 MG tablet Take 1 tablet (10 mg total) by mouth daily. 02/06/24   Oley Bascom RAMAN, NP  venlafaxine  XR (EFFEXOR -XR) 150 MG 24 hr capsule Take 1 capsule (150 mg total) by mouth daily. 02/06/24 02/05/25  Oley Bascom RAMAN, NP  Vitamin D , Ergocalciferol , (DRISDOL ) 1.25 MG (50000 UNIT) CAPS capsule TAKE ONE CAPSULE BY MOUTH EVERY SEVEN DAYS 10/08/24   Nichols, Tonya S, NP    Allergies: Aspirin , Ibuprofen [ibuprofen], and Penicillins    Review of Systems  Constitutional:  Positive for activity change, appetite change and fatigue.  Cardiovascular:  Positive for chest pain.  Gastrointestinal:  Positive for abdominal pain, diarrhea, nausea and vomiting.  Musculoskeletal:  Positive for arthralgias and myalgias.  Neurological:  Positive for weakness (Generalized) and headaches.  All other systems reviewed and are negative.   Updated Vital Signs BP (!) 111/91   Pulse (!) 107   Temp 98 F (36.7 C) (Oral)   Resp 20   SpO2 95%   Physical Exam Vitals and nursing note reviewed.  Constitutional:      General: She is not in acute distress.    Appearance: Normal appearance. She is well-developed. She is not ill-appearing, toxic-appearing or diaphoretic.  HENT:     Head: Normocephalic and atraumatic.     Right  Ear: External ear normal.     Left Ear: External ear normal.     Nose: Nose normal.     Mouth/Throat:     Mouth: Mucous membranes are dry.     Pharynx: Oropharynx is clear.  Eyes:     Conjunctiva/sclera: Conjunctivae normal.     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 Tachycardia present.     Heart sounds: No murmur heard. Pulmonary:     Effort: Pulmonary effort is normal. No respiratory distress.     Breath sounds: Normal breath sounds. No wheezing or rales.  Abdominal:     General: There is no distension.     Palpations: Abdomen is soft.     Tenderness: There is no abdominal tenderness.  Musculoskeletal:        General: No swelling or deformity. Normal range of motion.     Cervical back: Normal range of motion and neck supple.  Skin:    General: Skin is warm and dry.     Coloration: Skin is not jaundiced or pale.  Neurological:     General: No focal deficit present.     Mental Status: She is alert and oriented to person, place, and time.     Cranial Nerves: No cranial nerve deficit.     Sensory: No sensory deficit.     Motor: No weakness.     Coordination: Coordination normal.  Psychiatric:        Mood and Affect: Mood normal.        Behavior: Behavior normal.     (all labs ordered are listed, but only abnormal results are displayed) Labs Reviewed  COMPREHENSIVE METABOLIC PANEL WITH GFR - Abnormal; Notable for the following components:      Result Value   Sodium 133 (*)    Chloride 94 (*)    Creatinine, Ser 1.15 (*)    Calcium  10.8 (*)    GFR, Estimated 52 (*)    All other components within normal limits  CBC - Abnormal; Notable for the following components:   Platelets 410 (*)    All other components within normal limits  URINALYSIS, W/ REFLEX TO CULTURE (INFECTION SUSPECTED) - Abnormal; Notable for the following components:   Color, Urine AMBER (*)    APPearance CLOUDY (*)    Protein, ur 30  (*)    Leukocytes,Ua LARGE (*)    Non Squamous Epithelial 6-10 (*)    All other components within normal limits  MAGNESIUM  - Abnormal; Notable for the following components:   Magnesium  1.5 (*)    All other components within normal limits  RESP PANEL BY RT-PCR (RSV, FLU A&B, COVID)  RVPGX2  CULTURE, BLOOD (ROUTINE X 2)  CULTURE, BLOOD (ROUTINE X 2)  C DIFFICILE QUICK SCREEN W PCR REFLEX    URINE CULTURE  LIPASE, BLOOD  CK  I-STAT CG4 LACTIC  ACID, ED  I-STAT CG4 LACTIC ACID, ED  TROPONIN T, HIGH SENSITIVITY  TROPONIN T, HIGH SENSITIVITY    EKG: EKG Interpretation Date/Time:  Wednesday October 15 2024 05:06:54 EST Ventricular Rate:  111 PR Interval:  131 QRS Duration:  99 QT Interval:  349 QTC Calculation: 475 R Axis:   73  Text Interpretation: Sinus tachycardia Atrial premature complex Nonspecific T abnormalities, diffuse leads Confirmed by Melvenia Motto [phone]) on 10/15/2024 5:25:03 AM  Radiology: [facility] Chest Port 1 View Result Date: 10/15/2024 EXAM: 1 VIEW XRAY OF THE CHEST 10/15/2024 04:10:00 AM COMPARISON: Portable chest 06/07/2024. Comparison to prior study reveals no significant interval change. CLINICAL HISTORY: Questionable sepsis - evaluate for abnormality. Questionable sepsis; evaluate for abnormality. FINDINGS: LINES, TUBES AND DEVICES: Epicardial pacing wires. LUNGS AND PLEURA: No focal pulmonary opacity. No pleural effusion. No pneumothorax. HEART AND MEDIASTINUM: Mild cardiomegaly. No evidence of CHF. Stable mediastinum. BONES AND SOFT TISSUES: Thoracic spondylosis and osteopenia. Intact sternotomy sutures. Scattered surgical clips in the upper abdomen. IMPRESSION: 1. No acute findings. Stable exam. Electronically signed by: Francis Quam MD 10/15/2024 04:38 AM EST RP Workstation: [id number]     Procedures   Medications Ordered in the ED  ondansetron  (ZOFRAN ) injection 4 mg (4 mg Intravenous Given 10/15/24 0535)  magnesium  sulfate IVPB 2 g 50 mL (2 g Intravenous New  Bag/Given 10/15/24 0702)  lactated ringers  bolus 1,000 mL (0 mLs Intravenous Stopped 10/15/24 0637)  acetaminophen  (TYLENOL ) tablet 650 mg (650 mg Oral Given 10/15/24 0538)  lactated ringers  bolus 1,000 mL (1,000 mLs Intravenous New Bag/Given 10/15/24 0700)                                    Medical Decision Making Amount and/or Complexity of Data Reviewed Labs: ordered. Radiology: ordered.  Risk OTC drugs. Prescription drug management.   This patient presents to the ED for concern of multiple complaints, this involves an extensive number of treatment options, and is a complaint that carries with it a high risk of complications and morbidity.  The differential diagnosis includes URI, other infection, dehydration, metabolic derangements   Co morbidities / Chronic conditions that complicate the patient evaluation  adrenal insufficiency, depression, anemia, migraines, GERD, PUD, HLD, asthma, CVA   Additional history obtained:  Additional history obtained from EMR External records from outside source obtained and reviewed including N/A   Lab Tests:  I Ordered, and personally interpreted labs.  The pertinent results include: Normal hemoglobin, no leukocytosis, baseline creatinine, hypomagnesemia with otherwise normal electrolytes   Imaging Studies ordered:  I ordered imaging studies including chest x-ray I independently visualized and interpreted imaging which showed no acute findings I agree with the radiologist interpretation   Cardiac Monitoring: / EKG:  The patient was maintained on a cardiac monitor.  I personally viewed and interpreted the cardiac monitored which showed an underlying rhythm of: Sinus rhythm   Problem List / ED Course / Critical interventions / Medication management  Patient presenting for multiple complaints.  On arrival in the ED, she is alert and oriented.  She has no focal deficits.  She does have a flat affect.  She initially tells me about family  problems at home regarding behavior of her daughter.  When asked why she is in the ED, she states that she has been having progressive generalized weakness over the past 4 days, in the setting of decreased p.o. intake, nausea, vomiting, diarrhea, diffuse myalgias,  headache.  She does have dry oral mucosa on exam.  IV fluids were ordered for hydration.  Workup was initiated.  Patient's lab work was notable for hypomagnesemia.  Replacement magnesium  was ordered.  On reassessment, she does state that she feels improved after IV fluids.  Additional IV fluids were ordered.  She was given water and crackers for a p.o. challenge.  Plan will be for trial of ambulation prior to discharge.  Care of patient was signed out to oncoming ED provider. I ordered medication including IV fluids for hydration, Zofran  for nausea, Tylenol  for myalgias, magnesium  sulfate for hypomagnesemia Reevaluation of the patient after these medicines showed that the patient improved I have reviewed the patients home medicines and have made adjustments as needed  Social Determinants of Health:  Has PCP     Final diagnoses:  Nausea vomiting and diarrhea  Generalized weakness  Dehydration  Hypomagnesemia    ED Discharge Orders          Ordered    ondansetron  (ZOFRAN -ODT) 4 MG disintegrating tablet  Every 8 hours PRN        10/15/24 0640               Melvenia Motto, MD 10/15/24 KELLY    Melvenia Motto, MD 10/15/24 [phone]  "

## 2024-10-15 NOTE — ED Notes (Signed)
 RN assumed care at this time, report from [employer]

## 2024-10-15 NOTE — Assessment & Plan Note (Signed)
 Mild, suspect due to dehydration given history.  IV fluids as below.  Monitor BMP.

## 2024-10-15 NOTE — ED Notes (Signed)
 PT unable to tolerate standing from a sitting position. Pt reports dizziness and nausea. PT placed back into bed, on cardiac monitor and VSS. Dr. Dasie aware and pt medicated per provider orders

## 2024-10-15 NOTE — ED Notes (Signed)
 PT given orange juice & graham crackers for low sugar, per RN request.

## 2024-10-15 NOTE — ED Triage Notes (Signed)
 Pt arrives via [facility] from home. Called out for nausea, vomiting and diarrhea for 2 days. Weakness and body aches. Hx of GI ulcers. Recent flu exposure. Alert and oriented. En route, vital were BP 88 palpated, 98% ra, 115 hr, cbg 107, 98.5 temp.

## 2024-10-15 NOTE — Assessment & Plan Note (Addendum)
 Adrenal crisis -- pt presents with constellation of symptoms as above.  Seems acutely ill with UTI vs GI illness, but Gi symptoms may be from crisis. BP was orthostatic to 80's systolic despite 2 L IV fluid resuscitation in the ED.   --ED giving 100 mg IV Solu-Cortef  --Continue IV Solu-Cortef  50 mg Q6H --Taper to home oral dose when tolerating PO meds and BP's stabilized --Mgmt of underlying/related issues as outlined  Hypoglycemia - CBG's every 4 hours. IV fluids: D5-NS Hypoglycemia protocol

## 2024-10-15 NOTE — Assessment & Plan Note (Signed)
 Check C. difficile and GI panel Enteric precautions for now until results back and negative. IV fluids Monitor replace electrolytes

## 2024-10-15 NOTE — Assessment & Plan Note (Signed)
 SIRS due to adrenal crisis vs sepsis due to ?UTI vs GI illness. POA with tachycardia, intermittent tachypnea.  CXR negative.  Viral PCR's negative.  UA questionable, pt reports dysuria. --Monitor fever curve, vitals closely --Follow blood and urine cultures, stool studies --IV fluids for adrenal crisis / hypotension --Empiric antibiotics as outlined for now

## 2024-10-15 NOTE — Assessment & Plan Note (Addendum)
 Left-eye blindness\ No acute issues or neurologic changes. Monitor.

## 2024-10-15 NOTE — Assessment & Plan Note (Signed)
 IV PPI BID for now

## 2024-10-15 NOTE — Discharge Instructions (Signed)
 A prescription for a medication called ondansetron  was sent to your pharmacy.  Take this as needed for nausea.  Ensure that you are having frequent meals and drinking plenty of fluids at home to maintain good hydration.  If you do experience any new or worsening symptoms of concern, please return to the emergency department.

## 2024-10-15 NOTE — H&P (Addendum)
 " History and Physical    PatientCASIDEE JANN FMW:[id number] DOB: May 28, 1956 DOA: 10/15/2024 DOS: the patient was seen and examined on 10/15/2024 PCP: Oley Bascom RAMAN, NP  Patient coming from: Home  Referring Provider: Curtistine Dawn, MD Telemedicine Provider: Burnard Cunning, DO Provider  Location: [place], [place] Patient Location: [facility] Referring Diagnosis: Hypotension / Adrenal crisis Patient Name and DOB verified: [name], October 26, 1955 Patient consented to Telemedicine Evaluation: Yes RN virtual assistant: Norleen Olp, RN Video encounter time and date: 10/15/2024 12:00 PM   Chief Complaint:  Chief Complaint  Patient presents with   Emesis   HPI: [name] is a 69 y.o. female with medical history significant of adrenal insufficiency on chronic hydrocortisone , remote history of atrial myxoma s/p resection, history of embolic stroke with left eye blindness, HLD, hypothyroidism, chronic migraines, PUD, symptomatic NSVT/PVCs, depression who presented to the [facility] ED overnight for evaluation of generalized weakness, diffuse myalgias, nausea/vomiting, non-bloody diarrhea and poor oral intake for the past 2 days.  Patient was reportedly hypotensive with EMS.  Pt endorses head-spinning dizziness when standing, is unable to ambulate unassisted or remain standing.  She reports two falls related to dizziness but denies injuries.  She reports abdominal pain only when coughing. Reports having a dry throat and coughing from that. Reports being very thirsty related to dry throat.  Her son recently had pneumonia, no other known sick contacts.  She reports anything she's attempted to eat or drink, including her medications have come right back up, including her steroids - for past 2 days.  She does report burning on urination, and having hot and cold spells.  She reports history of bleeding ulcer from steroids and denies seeing blood in any of the episodes of vomit, stating  she watches for that.   ED course -  Initial vitals --temp 23F, HR 115, RR 20, initial BP 115/74, SpO2 95% on room air.  Positive orthostatic vitals with BP dropping from 107/58 pulse 108 supine to 88/67 pulse 125 on standing --after 2 L IV fluids. Pt reportedly became too dizzy to tolerate standing. Labs obtained including CMP and CBC were notable for sodium 133, chloride 94, creatinine 1.15, calcium  10.8, magnesium  1.5, no leukocytosis, platelets 410. Troponin normal 7. Viral PCR is negative for COVID, flu A/B and RSV. Urinalysis with large leukocytes and pyuria but no bacteria seen and epi cells's present.  Urine sample was sent for culture.  Blood culture was also obtained x 2. Imaging- - Portable chest x-ray showed no acute findings, mild cardiomegaly, thoracic spondylosis and osteopenia, intact sternotomy sutures.   Patient was treated in the ED with 650 mg p.o. Tylenol , 2 L bolus LR fluids, 4 mg IV Zofran , 2 g IV mag sulfate, and is being given 100 mg IV Solu-Cortef  currently.  Blood & urine cultures were sent.  C diff testing ordered.  Patient is being admitted to progressive unit for further evaluation and management as outlined in detail below for apparent adrenal crisis in the setting of acute GI illness.  Review of Systems: As mentioned in the history of present illness. All other systems reviewed and are negative.   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   Asthma    Atrial myxoma    Blindness of left eye 1978   Blood transfusion  Breast cyst    Constipation    Cushing's syndrome    CVA (cerebral infarction)    Depression    Depression    GERD (gastroesophageal reflux disease)    Glucocorticoid deficiency    Hemorrhoids    History of open heart surgery 1977   Hyperlipemia    Hypothyroidism    per office note dated 03/25/2014   Hypoxia    Migraine    Peptic ulcer  associated with Helicobacter pylori infection    Reflux    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; leaving me blind   Past Surgical History:  Procedure Laterality Date   ADRENAL GLAND SURGERY  09/11/1980   on hydrocortisone    ATRIAL MYXOMA EXCISION  09/11/1976   BREAST BIOPSY  07/13/1999   ductal tissue excision & bx right; bx only left breast   COLONOSCOPY     EYE SURGERY     LEFT HEART CATHETERIZATION WITH CORONARY ANGIOGRAM N/A 10/06/2011   Procedure: LEFT HEART CATHETERIZATION WITH CORONARY ANGIOGRAM;  Surgeon: Rober LOISE Chroman, MD;  Location: [facility] CATH LAB;  Service: Cardiovascular;  Laterality: N/A;   ORBITOTOMY Right 07/27/2021   POLYPECTOMY     repaired cross eyed     as a child   ROTATOR CUFF REPAIR  05/12/2005   left   VAGINAL HYSTERECTOMY  09/11/1998   partial; w/left ovary   Social History:  reports that she has never smoked. She has never used smokeless tobacco. She reports that she does not drink alcohol and does not use drugs.  Allergies[1]  Family History  Problem Relation Age of Onset   Acromegaly Father    Heart disease Father        Tumor in the heart   Stomach cancer Father    Colon cancer Sister 109   Diabetes Sister    Kidney disease Sister        x2   Heart disease Sister        Tumor in the heart   Heart disease Daughter        Tumor in the heart   Colon polyps Neg Hx    Esophageal cancer Neg Hx    Gallbladder disease Neg Hx    Migraines Neg Hx    Rectal cancer Neg Hx     Prior to Admission medications  Medication Sig Start Date End Date Taking? Authorizing Provider  aspirin  EC 81 MG tablet Take 81 mg by mouth daily. Swallow whole.   Yes [provider]  fenofibrate  (TRICOR ) 48 MG tablet Take 1 tablet (48 mg total) by mouth daily. 09/02/24  Yes Oley Bascom RAMAN, NP  Galcanezumab -gnlm (EMGALITY ) 120 MG/ML SOAJ Inject 120 mg into the skin every 30 (thirty) days. 10/25/23  Yes McCue, Harlene, NP   hydrocortisone  (CORTEF ) 10 MG tablet Take 5-10 mg by mouth See admin instructions. Take 10mg  with breakfast and 5mg  at 3pm.   Yes [provider]  hydroxypropyl methylcellulose / hypromellose (ISOPTO TEARS / GONIOVISC) 2.5 % ophthalmic solution Place 1 drop into both eyes 4 (four) times daily as needed for dry eyes.   Yes [provider]  hydrOXYzine  (ATARAX ) 10 MG tablet TAKE ONE TABLET BY MOUTH AT BEDTIME AS NEEDED 01/23/24  Yes Nichols, Tonya S, NP  levothyroxine  (SYNTHROID ) 75 MCG tablet Take 1 tablet (75 mcg total) by mouth daily before breakfast. 09/24/24  Yes Nichols, Tonya S, NP  nitroGLYCERIN  (NITROSTAT ) 0.4 MG SL tablet Place 1 tablet (  0.4 mg total) under the tongue every 5 (five) minutes as needed for chest pain 07/23/24  Yes Oley Bascom RAMAN, NP  omega-3 acid ethyl esters (LOVAZA ) 1 g capsule TAKE ONE CAPSULE BY MOUTH TWICE DAILY 07/23/24  Yes Nichols, Tonya S, NP  pantoprazole  (PROTONIX ) 20 MG tablet Take 1 tablet (20 mg total) by mouth daily. 10/08/24  Yes Paseda, Folashade R, FNP  Rimegepant Sulfate (NURTEC) 75 MG TBDP Take 1 tablet (75 mg total) by mouth daily as needed. For migraines. Take as close to onset of migraine as possible. One daily maximum. 10/25/23  Yes McCue, Harlene, NP  rosuvastatin  (CRESTOR ) 10 MG tablet Take 1 tablet (10 mg total) by mouth daily. 02/06/24  Yes Oley Bascom RAMAN, NP  venlafaxine  XR (EFFEXOR -XR) 150 MG 24 hr capsule Take 1 capsule (150 mg total) by mouth daily. 02/06/24 02/05/25 Yes Oley Bascom RAMAN, NP  Vitamin D , Ergocalciferol , (DRISDOL ) 1.25 MG (50000 UNIT) CAPS capsule TAKE ONE CAPSULE BY MOUTH EVERY SEVEN DAYS 10/08/24  Yes Nichols, Tonya S, NP  ondansetron  (ZOFRAN -ODT) 4 MG disintegrating tablet Take 1 tablet (4 mg total) by mouth every 8 (eight) hours as needed for nausea or vomiting. 10/15/24   Melvenia Motto, MD    Physical Exam: Vitals:   10/15/24 1100 10/15/24 1114 10/15/24 1200 10/15/24 1243  BP: (!) 99/57 109/67 103/71 101/86   Pulse:   (!) 102 (!) 102  Resp: 13 16 12 20   Temp:   98.1 F (36.7 C) 97.6 F (36.4 C)  TempSrc:   Oral Oral  SpO2:  100% 100% 100%  Weight:   72.6 kg   Height:   4' 11 (1.499 m)    Bedside physical exam was performed by RN listed above. Below exam findings are based on their in person physical exam findings and my observations during virtual encounter.  General exam: awake, alert, no acute distress HEENT: Voice clear, hearing grossly normal  Respiratory system: CTAB, no wheezes, rales or rhonchi, normal respiratory effort. Cardiovascular system: normal S1/S2, tachycardic, regular rhythm, no murmurs, rubs, gallops,  no peripheral edema.   Gastrointestinal system: soft, non-distended with diffuse mild tenderness on palpation, no rebound or guarding, +bowel sounds x 4 Central nervous system: A&O x 3. no gross focal neurologic deficits, normal speech Skin: dry, intact, normal temperature per RN Psychiatry: normal mood, congruent affect, judgement and insight appear normal   Data Reviewed:  As reviewed in detail above  Assessment and Plan:  * Hypotension Adrenal crisis -- pt presents with constellation of symptoms as above.  Seems acutely ill with UTI vs GI illness, but Gi symptoms may be from crisis. BP was orthostatic to 80's systolic despite 2 L IV fluid resuscitation in the ED.   --ED giving 100 mg IV Solu-Cortef  --Continue IV Solu-Cortef  50 mg Q6H --Taper to home oral dose when tolerating PO meds and BP's stabilized --Mgmt of underlying/related issues as outlined  Hypoglycemia - CBG's every 4 hours. IV fluids: D5-NS Hypoglycemia protocol  SIRS (systemic inflammatory response syndrome) (HCC) SIRS due to adrenal crisis vs sepsis due to ?UTI vs GI illness. POA with tachycardia, intermittent tachypnea.  CXR negative.  Viral PCR's negative.  UA questionable, pt reports dysuria. --Monitor fever curve, vitals closely --Follow blood and urine cultures, stool studies --IV  fluids for adrenal crisis / hypotension --Empiric antibiotics as outlined for now  Abnormal urinalysis Pt endorses dysuria and subjective fever/chills.  N/V could be related to Uti vs adrenal crisis.   --Follow urine culture --Empiric Rocephin   x 3 days ordered  Low oxygen saturation ED RN reported patient's O2 sat dropped to low 80's at one point in the setting of ambulatory trial with concurrent hypotension and tachycardia, improved on 2 L/min [place] O2.  CXR was negative.  Pt reports a cough related to dry throat, otherwise denies respiratory symptoms. --Supplement O2 PRN, maintain spO2 > 92%, wean as tolerated --Incentive spirometer --Check d-dimer  Nausea & vomiting Poor PO intake Suspect due to adrenal crisis  --IV fluids & IV steroids as outlined --IV antiemetics PRN --Clear liquids for now & advance diet as tolerated --replace electrolytes --IV PPI for now --Follow cultures --Further evaluation to include CT and GI consultation if not improved with IV steroids as anticipated  History of CVA with residual deficit Left-eye blindness\ No acute issues or neurologic changes. Monitor.   Frequent PVCs Monitor on telemetry Replace electrolytes PRN  Hypercalcemia Mild, suspect due to dehydration given history.  IV fluids as below.  Monitor BMP.  Diarrhea Check C. difficile and GI panel Enteric precautions for now until results back and negative. IV fluids Monitor replace electrolytes  Hypomagnesemia Mg 1.5 on admission.  Pt given 2 g Mg-sulfate IV in the ED. --repeat Mg level in AM --further replacement PRN, goal Mg >2  GERD (gastroesophageal reflux disease) IV PPI BID for now  Depression Resume home Effexor  when tolerating PO meds.  Hypothyroidism Resume Synthroid  75 mcg daily  Adrenal insufficiency Currently requiring IV steroids for crisis      Advance Care Planning:   Code Status: Full Code   Consults: None at this time  Family Communication: None  present during virtual admission encounter  Severity of Illness: The appropriate patient status for this patient is OBSERVATION. Observation status is judged to be reasonable and necessary in order to provide the required intensity of service to ensure the patient's safety. The patient's presenting symptoms, physical exam findings, and initial radiographic and laboratory data in the context of their medical condition is felt to place them at decreased risk for further clinical deterioration. Furthermore, it is anticipated that the patient will be medically stable for discharge from the hospital within 2 midnights of admission.   Author: Burnard DELENA Cunning, DO 10/15/2024 1:15 PM  For on call review [email or web address].      [1]  Allergies Allergen Reactions   Aspirin  Other (See Comments)    Can take baby aspirin  (heart flutters with high doses)   Ibuprofen [Ibuprofen] Nausea And Vomiting   Penicillins Nausea And Vomiting   "

## 2024-10-15 NOTE — ED Triage Notes (Signed)
 Pt reports she has had two episodes of vomiting and diarrhea. Denies fevers. Pain all over. She says she is under a lot of stress. Cough.

## 2024-10-15 NOTE — Assessment & Plan Note (Signed)
 Monitor on telemetry Replace electrolytes PRN

## 2024-10-15 NOTE — Assessment & Plan Note (Signed)
 Pt endorses dysuria and subjective fever/chills.  N/V could be related to Uti vs adrenal crisis.   --Follow urine culture --Empiric Rocephin  x 3 days ordered

## 2024-10-15 NOTE — Assessment & Plan Note (Signed)
 Currently requiring IV steroids for crisis

## 2024-10-15 NOTE — ED Provider Notes (Signed)
 Patient signed to me by Dr. Melvenia.  Results of workup here.  Patient given 2 L of fluid but continues to be orthostatic.  Patient does have a history of adrenal insufficiency.  Given hydrocortisone  100 mg IV push here.  Patient seems to endorse weakness.  Will require admission.  Will consult hospitalist team   Dasie Faden, MD 10/15/24 1021

## 2024-10-15 NOTE — Assessment & Plan Note (Addendum)
 Resume home Effexor  when tolerating PO meds.

## 2024-10-15 NOTE — Assessment & Plan Note (Signed)
 Resume Synthroid 75 mcg daily

## 2024-10-15 NOTE — Assessment & Plan Note (Addendum)
 Poor PO intake Suspect due to adrenal crisis  --IV fluids & IV steroids as outlined --IV antiemetics PRN --Clear liquids for now & advance diet as tolerated --replace electrolytes --IV PPI for now --Follow cultures --Further evaluation to include CT and GI consultation if not improved with IV steroids as anticipated

## 2024-10-15 NOTE — Assessment & Plan Note (Addendum)
 Mg 1.5 on admission.  Pt given 2 g Mg-sulfate IV in the ED. --repeat Mg level in AM --further replacement PRN, goal Mg >2

## 2024-10-16 LAB — BASIC METABOLIC PANEL WITH GFR
Anion gap: 9 (ref 5–15)
BUN: 11 mg/dL (ref 8–23)
CO2: 23 mmol/L (ref 22–32)
Calcium: 9.6 mg/dL (ref 8.9–10.3)
Chloride: 103 mmol/L (ref 98–111)
Creatinine, Ser: 0.95 mg/dL (ref 0.44–1.00)
GFR, Estimated: >60 mL/min
Glucose, Bld: 181 mg/dL — ABNORMAL HIGH (ref 70–99)
Potassium: 4.8 mmol/L (ref 3.5–5.1)
Sodium: 135 mmol/L (ref 135–145)

## 2024-10-16 LAB — CBC
HCT: 34.5 % — ABNORMAL LOW (ref 36.0–46.0)
Hemoglobin: 11.3 g/dL — ABNORMAL LOW (ref 12.0–15.0)
MCH: 29.7 pg (ref 26.0–34.0)
MCHC: 32.8 g/dL (ref 30.0–36.0)
MCV: 90.6 fL (ref 80.0–100.0)
Platelets: 321 10*3/uL (ref 150–400)
RBC: 3.81 MIL/uL — ABNORMAL LOW (ref 3.87–5.11)
RDW: 12.8 % (ref 11.5–15.5)
WBC: 6.3 10*3/uL (ref 4.0–10.5)
nRBC: 0 % (ref 0.0–0.2)

## 2024-10-16 LAB — GLUCOSE, CAPILLARY
Glucose-Capillary: 107 mg/dL — ABNORMAL HIGH (ref 70–99)
Glucose-Capillary: 153 mg/dL — ABNORMAL HIGH (ref 70–99)
Glucose-Capillary: 159 mg/dL — ABNORMAL HIGH (ref 70–99)
Glucose-Capillary: 167 mg/dL — ABNORMAL HIGH (ref 70–99)
Glucose-Capillary: 175 mg/dL — ABNORMAL HIGH (ref 70–99)
Glucose-Capillary: 98 mg/dL (ref 70–99)

## 2024-10-16 LAB — URINE CULTURE

## 2024-10-16 LAB — MAGNESIUM: Magnesium: 1.8 mg/dL (ref 1.7–2.4)

## 2024-10-16 MED ORDER — VENLAFAXINE HCL ER 150 MG PO CP24
150.0000 mg | ORAL_CAPSULE | Freq: Every day | ORAL | Status: DC
  Administered 2024-10-16 – 2024-10-17 (×2): 150 mg via ORAL
  Filled 2024-10-16 (×2): qty 1

## 2024-10-16 NOTE — Progress Notes (Signed)
 " PROGRESS NOTE    Mikayla ANTENUCCI  FMW:[id number] DOB: March 05, 1956 DOA: 10/15/2024 PCP: Oley Bascom RAMAN, NP   Brief Narrative:  69 y.o. female with medical history significant of adrenal insufficiency on chronic hydrocortisone , remote history of atrial myxoma s/p resection, history of embolic stroke with left eye blindness, HLD, hypothyroidism, chronic migraines, PUD, symptomatic NSVT/PVCs, depression presented with generalized weakness, myalgias, nausea/vomiting and nonbloody diarrhea with poor oral intake.  On presentation, she was tachycardic with orthostatic hypotension with dizziness.  Creatinine 1.15, magnesium  1.5, no leukocytosis.  COVID/influenza/RSV PCR negative.  UA suggestive of UTI.  Portable chest x-ray showed no acute findings.  She was started on IV fluids, antibiotics and IV Solu-Cortef .  Assessment & Plan:   Hypotension Possible adrenal crisis - Blood pressure still on the lower side but stable.  Currently on Solu-Cortef .  Switch to oral home dose with tolerating oral meds.  Continue IV fluids.  Encourage oral intake.  SIRS - Possibly from adrenal crisis.  Still intermittently tachycardic.  Follow-up blood cultures.  Urine culture grew multiple species.  Possible UTI: Present on admission -Urine culture as above.  Continue IV Rocephin  for 3 days.  Nausea and vomiting Poor oral intake GERD - Possibly from adrenal crisis.  Improving.  Continue IV fluids.  Feels hungry.  Advance diet to soft diet today.  Continue IV PPI  Hypothyroidism Continue levothyroxine   History of specified CVA with residual deficit Left eye blindness - No acute issues or neurologic changes.  PT eval.  Frequent PVCs - Monitoring telemetry  Hypercalcemia -Resolved  Hypomagnesemia - Resolved  Diarrhea - No diarrhea since admission.  DC stool testing for enteric precautions.  Depression - Resume Effexor   Obesity class I - Outpatient follow-up  DVT prophylaxis: Lovenox  Code  Status: Full Family Communication: None at bedside Disposition Plan: Status is: Observation The patient will require care spanning > 2 midnights and should be moved to inpatient because: Of severity of illness  Consultants: None  Procedures: None  Antimicrobials: Rocephin  from 10/15/2024 onwards   Subjective: Patient seen and examined at bedside.  Feels slightly better but still feels weak.  Wants to try soft diet today.  No diarrhea since admission.  Denies worsening abdominal pain.  Objective: Vitals:   10/15/24 1616 10/15/24 2013 10/16/24 0014 10/16/24 0418  BP: 103/73 (!) 91/58 108/76 107/71  Pulse: (!) 108 100 (!) 104 (!) 105  Resp: 20 18 18 18   Temp: 99.1 F (37.3 C) 98 F (36.7 C) 97.6 F (36.4 C) 97.6 F (36.4 C)  TempSrc: Oral Oral    SpO2: 100% 100% 100% 93%  Weight:      Height:        Intake/Output Summary (Last 24 hours) at 10/16/2024 9043 Last data filed at 10/16/2024 0827 Gross per 24 hour  Intake 2085.7 ml  Output 2450 ml  Net -364.3 ml   Filed Weights   10/15/24 1200  Weight: 72.6 kg    Examination:  General exam: Appears calm and comfortable  Respiratory system: Bilateral decreased breath sounds at bases Cardiovascular system: S1 & S2 heard, tachycardic intermittently Gastrointestinal system: Abdomen is obese, nondistended, soft and nontender. Normal bowel sounds heard. Extremities: No cyanosis, clubbing, edema  Central nervous system: Alert and oriented.  Slow to respond.  Poor historian.  No focal neurological deficits. Moving extremities Skin: No rashes, lesions or ulcers Psychiatry: Flat affect.  Not agitated  Data Reviewed: I have personally reviewed following labs and imaging studies  CBC: Recent Labs  Lab  10/15/24 0520 10/16/24 0416  WBC 7.1 6.3  HGB 14.8 11.3*  HCT 44.7 34.5*  MCV 90.7 90.6  PLT 410* 321   Basic Metabolic Panel: Recent Labs  Lab 10/15/24 0520 10/15/24 1317 10/16/24 0416  NA 133*  --  135  K 4.9  --  4.8   CL 94*  --  103  CO2 25  --  23  GLUCOSE 98  --  181*  BUN 20  --  11  CREATININE 1.15* 1.02* 0.95  CALCIUM  10.8*  --  9.6  MG 1.5*  --  1.8   GFR: Estimated Creatinine Clearance: 50.6 mL/min (by C-G formula based on SCr of 0.95 mg/dL). Liver Function Tests: Recent Labs  Lab 10/15/24 0520  AST 37  ALT 16  ALKPHOS 61  BILITOT 0.5  PROT 8.1  ALBUMIN 4.0   Recent Labs  Lab 10/15/24 0520  LIPASE 15   No results for input(s): AMMONIA in the last 168 hours. Coagulation Profile: No results for input(s): INR, PROTIME in the last 168 hours. Cardiac Enzymes: Recent Labs  Lab 10/15/24 1317  CKTOTAL 197   BNP (last 3 results) No results for input(s): PROBNP in the last 8760 hours. HbA1C: No results for input(s): HGBA1C in the last 72 hours. CBG: Recent Labs  Lab 10/15/24 1623 10/15/24 2035 10/16/24 0004 10/16/24 0414 10/16/24 0749  GLUCAP 142* 188* 153* 159* 175*   Lipid Profile: No results for input(s): CHOL, HDL, LDLCALC, TRIG, CHOLHDL, LDLDIRECT in the last 72 hours. Thyroid  Function Tests: No results for input(s): TSH, T4TOTAL, FREET4, T3FREE, THYROIDAB in the last 72 hours. Anemia Panel: No results for input(s): VITAMINB12, FOLATE, FERRITIN, TIBC, IRON, RETICCTPCT in the last 72 hours. Sepsis Labs: No results for input(s): PROCALCITON, LATICACIDVEN in the last 168 hours.  Recent Results (from the past 240 hours)  Resp panel by RT-PCR (RSV, Flu A&B, Covid) Anterior Nasal Swab     Status: None   Collection Time: 10/15/24  3:56 AM   Specimen: Anterior Nasal Swab  Result Value Ref Range Status   SARS Coronavirus 2 by RT PCR NEGATIVE NEGATIVE Final    Comment: (NOTE) SARS-CoV-2 target nucleic acids are NOT DETECTED.  The SARS-CoV-2 RNA is generally detectable in upper respiratory specimens during the acute phase of infection. The lowest concentration of SARS-CoV-2 viral copies this assay can detect is 138  copies/mL. A negative result does not preclude SARS-Cov-2 infection and should not be used as the sole basis for treatment or other patient management decisions. A negative result may occur with  improper specimen collection/handling, submission of specimen other than nasopharyngeal swab, presence of viral mutation(s) within the areas targeted by this assay, and inadequate number of viral copies(<138 copies/mL). A negative result must be combined with clinical observations, patient history, and epidemiological information. The expected result is Negative.  Fact Sheet for Patients:  bloggercourse.com  Fact Sheet for Healthcare Providers:  seriousbroker.it  This test is no t yet approved or cleared by the [place]  FDA and  has been authorized for detection and/or diagnosis of SARS-CoV-2 by FDA under an Emergency Use Authorization (EUA). This EUA will remain  in effect (meaning this test can be used) for the duration of the COVID-19 declaration under Section 564(b)(1) of the Act, 21 U.S.C.section 360bbb-3(b)(1), unless the authorization is terminated  or revoked sooner.       Influenza A by PCR NEGATIVE NEGATIVE Final   Influenza B by PCR NEGATIVE NEGATIVE Final    Comment: (NOTE)  The Xpert Xpress SARS-CoV-2/FLU/RSV plus assay is intended as an aid in the diagnosis of influenza from Nasopharyngeal swab specimens and should not be used as a sole basis for treatment. Nasal washings and aspirates are unacceptable for Xpert Xpress SARS-CoV-2/FLU/RSV testing.  Fact Sheet for Patients: bloggercourse.com  Fact Sheet for Healthcare Providers: seriousbroker.it  This test is not yet approved or cleared by the [place]  FDA and has been authorized for detection and/or diagnosis of SARS-CoV-2 by FDA under an Emergency Use Authorization (EUA). This EUA will remain in effect (meaning  this test can be used) for the duration of the COVID-19 declaration under Section 564(b)(1) of the Act, 21 U.S.C. section 360bbb-3(b)(1), unless the authorization is terminated or revoked.     Resp Syncytial Virus by PCR NEGATIVE NEGATIVE Final    Comment: (NOTE) Fact Sheet for Patients: bloggercourse.com  Fact Sheet for Healthcare Providers: seriousbroker.it  This test is not yet approved or cleared by the [place]  FDA and has been authorized for detection and/or diagnosis of SARS-CoV-2 by FDA under an Emergency Use Authorization (EUA). This EUA will remain in effect (meaning this test can be used) for the duration of the COVID-19 declaration under Section 564(b)(1) of the Act, 21 U.S.C. section 360bbb-3(b)(1), unless the authorization is terminated or revoked.  Performed at [facility], [address], [place], [place] [address]   Urine Culture     Status: Abnormal   Collection Time: 10/15/24  5:16 AM   Specimen: Urine, Random  Result Value Ref Range Status   Specimen Description   Final    URINE, RANDOM Performed at [facility], [address] [place], [place] [address]    Special Requests   Final    NONE Reflexed from [id number] Performed at [facility], [address], [place], [place] [address]    Culture MULTIPLE SPECIES PRESENT, SUGGEST RECOLLECTION (A)  Final   Report Status 10/16/2024 FINAL  Final  Blood Culture (routine x 2)     Status: None (Preliminary result)   Collection Time: 10/15/24  5:20 AM   Specimen: BLOOD  Result Value Ref Range Status   Specimen Description   Final    BLOOD BLOOD RIGHT ARM Performed at [facility], [address], [place], [place] [address]    Special Requests   Final    BOTTLES DRAWN AEROBIC AND ANAEROBIC Blood Culture results may not be optimal due to an inadequate volume of blood received in  culture bottles Performed at [facility], [address], [place], [place] [address]    Culture   Final    NO GROWTH 1 DAY Performed at [facility], [address], [place], [place] [address]    Report Status PENDING  Incomplete  Blood Culture (routine x 2)     Status: None (Preliminary result)   Collection Time: 10/15/24  5:30 AM   Specimen: BLOOD  Result Value Ref Range Status   Specimen Description   Final    BLOOD BLOOD LEFT ARM Performed at [facility], [address] [place], [place] [address]    Special Requests   Final    BOTTLES DRAWN AEROBIC AND ANAEROBIC Blood Culture results may not be optimal due to an inadequate volume of blood received in culture bottles Performed at [facility], [address] [place], [place] [address]    Culture   Final    NO GROWTH 1 DAY Performed  at [facility], [address] [place], [place] [address]    Report Status PENDING  Incomplete         Radiology Studies: DG Chest Port 1 View Result Date: 10/15/2024 EXAM: 1 VIEW XRAY OF THE CHEST 10/15/2024 04:10:00 AM COMPARISON: Portable chest 06/07/2024. Comparison to prior study reveals no significant interval change. CLINICAL HISTORY: Questionable sepsis - evaluate for abnormality. Questionable sepsis; evaluate for abnormality. FINDINGS: LINES, TUBES AND DEVICES: Epicardial pacing wires. LUNGS AND PLEURA: No focal pulmonary opacity. No pleural effusion. No pneumothorax. HEART AND MEDIASTINUM: Mild cardiomegaly. No evidence of CHF. Stable mediastinum. BONES AND SOFT TISSUES: Thoracic spondylosis and osteopenia. Intact sternotomy sutures. Scattered surgical clips in the upper abdomen. IMPRESSION: 1. No acute findings. Stable exam. Electronically signed by: Francis Quam MD 10/15/2024 04:38 AM EST RP Workstation: [id number]        Scheduled Meds:  enoxaparin  (LOVENOX ) injection  40 mg Subcutaneous Q24H    hydrocortisone  sod succinate (SOLU-CORTEF ) inj  50 mg Intravenous Q6H   levothyroxine   75 mcg Oral QAC breakfast   omega-3 acid ethyl esters  1 capsule Oral BID   pantoprazole  (PROTONIX ) IV  40 mg Intravenous Q12H   Continuous Infusions:  cefTRIAXone  (ROCEPHIN )  IV 200 mL/hr at 10/15/24 1640   dextrose  5 % and 0.9 % NaCl 75 mL/hr at 10/16/24 0223          Sophie Mao, MD [facility] 10/16/2024, 9:56 AM   "

## 2024-10-16 NOTE — TOC Initial Note (Signed)
 Transition of Care [facility]) - Initial/Assessment Note    Patient Details  Name: [name] MRN: [id number] Date of Birth: May 30, 1956  Transition of Care [facility]) CM/SW Contact:    Suzen Favor, LCSWA Phone Number: 10/16/2024, 2:44 PM  Clinical Narrative:                 Pt from home with spouse. PT consulted. Care management awaiting evaluation.   Expected Discharge Plan:  (TBD) Barriers to Discharge: Continued Medical Work up  Patient Goals and CMS Choice          Expected Discharge Plan and Services In-house Referral: Clinical Social Work                                            Prior Living Arrangements/Services   Lives with:: Significant Other   Do you feel safe going back to the place where you live?: Yes               Activities of Daily Living   ADL Screening (condition at time of admission) Independently performs ADLs?: Yes (appropriate for developmental age) Is the patient deaf or have difficulty hearing?: No Does the patient have difficulty seeing, even when wearing glasses/contacts?: Yes Does the patient have difficulty concentrating, remembering, or making decisions?: No  Permission Sought/Granted                  Emotional Assessment       Orientation: : Oriented to Self, Oriented to Place, Oriented to  Time, Oriented to Situation Alcohol / Substance Use: Not Applicable Psych Involvement: No (comment)  Admission diagnosis:  Dehydration [E86.0] Hypomagnesemia [E83.42] Adrenal crisis [E27.2] Generalized weakness [R53.1] Nausea vomiting and diarrhea [R11.2, R19.7] Hypotension [I95.9] Patient Active Problem List   Diagnosis Date Noted   Hypomagnesemia 10/15/2024   History of CVA with residual deficit 10/15/2024   Diarrhea 10/15/2024   Abnormal urinalysis 10/15/2024   Hypercalcemia 10/15/2024   SIRS (systemic inflammatory response syndrome) (HCC) 10/15/2024   GERD (gastroesophageal reflux disease) 07/11/2024   Need  for influenza vaccination 07/11/2024   Encounter for examination following treatment at hospital 07/11/2024   Polyarthralgia 07/11/2024   Abdominal pain 06/07/2024   Hypertriglyceridemia 04/02/2023   Post-adrenalectomy adrenal insufficiency 07/21/2022   Chronic bilateral low back pain with right-sided sciatica 07/13/2022   Vitamin D  deficiency 04/03/2022   Intractable episodic cluster headache 02/22/2022   History of COVID-19 02/22/2022   Morbid obesity with BMI of 40.0-44.9, adult (HCC) 02/22/2022   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   Hypersomnia with sleep apnea 02/22/2022   Psychophysiological insomnia 02/22/2022   Witnessed episode of apnea 02/22/2022   Abnormal CT of the abdomen 01/30/2022   Snoring 01/05/2022   AKI (acute kidney injury) 12/24/2021   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   NSVT (nonsustained ventricular tachycardia) (HCC) 03/08/2021   New-onset angina 12/04/2020   Chronic migraine without aura, with intractable migraine, so stated, with status migrainosus 05/25/2020   Frequent PVCs    COVID-19 10/10/2019   Hypoxia    Migraine with aura and without status migrainosus, not intractable    Tachycardia    ARF (acute renal failure) 12/06/2018   Fever 12/06/2018   Low oxygen saturation 12/06/2018   Headache 11/02/2013   Dizziness 11/02/2013   Anemia 01/10/2013   Chest pain, unspecified 01/10/2013   Adjustment disorder with mixed anxiety and depressed mood 01/10/2013  Adrenal crisis 01/07/2013   Nausea & vomiting 08/03/2011   Hypotension 08/03/2011   Adrenal insufficiency 08/03/2011   Hyponatremia 08/03/2011   Hypothyroidism 08/03/2011   Depression 08/03/2011   PCP:  Oley Bascom RAMAN, NP Pharmacy:   [facility] - [place], [place] - [address] [place] [address] Phone: [phone] Fax: [phone]  [place] [facility] [address] [place] [address] Phone: [phone] Fax:  [phone]  Social Drivers of Health (SDOH) Social History: SDOH Screenings   Food Insecurity: No Food Insecurity (10/15/2024)  Housing: Low Risk (10/15/2024)  Transportation Needs: Unmet Transportation Needs (10/15/2024)  Utilities: Not At Risk (10/15/2024)  Depression (PHQ2-9): Low Risk (07/11/2024)  Social Connections: Socially Isolated (10/15/2024)  Tobacco Use: Low Risk (10/15/2024)   SDOH Interventions:     Readmission Risk Interventions     No data to display

## 2024-10-17 LAB — CBC WITH DIFFERENTIAL/PLATELET
Abs Immature Granulocytes: 0.05 10*3/uL (ref 0.00–0.07)
Basophils Absolute: 0 10*3/uL (ref 0.0–0.1)
Basophils Relative: 0 %
Eosinophils Absolute: 0 10*3/uL (ref 0.0–0.5)
Eosinophils Relative: 0 %
HCT: 34.2 % — ABNORMAL LOW (ref 36.0–46.0)
Hemoglobin: 10.8 g/dL — ABNORMAL LOW (ref 12.0–15.0)
Immature Granulocytes: 0 %
Lymphocytes Relative: 10 %
Lymphs Abs: 1.3 10*3/uL (ref 0.7–4.0)
MCH: 29.3 pg (ref 26.0–34.0)
MCHC: 31.6 g/dL (ref 30.0–36.0)
MCV: 92.9 fL (ref 80.0–100.0)
Monocytes Absolute: 0.3 10*3/uL (ref 0.1–1.0)
Monocytes Relative: 3 %
Neutro Abs: 11.4 10*3/uL — ABNORMAL HIGH (ref 1.7–7.7)
Neutrophils Relative %: 87 %
Platelets: 338 10*3/uL (ref 150–400)
RBC: 3.68 MIL/uL — ABNORMAL LOW (ref 3.87–5.11)
RDW: 13.4 % (ref 11.5–15.5)
WBC: 13.2 10*3/uL — ABNORMAL HIGH (ref 4.0–10.5)
nRBC: 0 % (ref 0.0–0.2)

## 2024-10-17 LAB — GLUCOSE, CAPILLARY
Glucose-Capillary: 110 mg/dL — ABNORMAL HIGH (ref 70–99)
Glucose-Capillary: 160 mg/dL — ABNORMAL HIGH (ref 70–99)
Glucose-Capillary: 172 mg/dL — ABNORMAL HIGH (ref 70–99)

## 2024-10-17 LAB — BASIC METABOLIC PANEL WITH GFR
Anion gap: 10 (ref 5–15)
BUN: 16 mg/dL (ref 8–23)
CO2: 23 mmol/L (ref 22–32)
Calcium: 9.9 mg/dL (ref 8.9–10.3)
Chloride: 103 mmol/L (ref 98–111)
Creatinine, Ser: 0.94 mg/dL (ref 0.44–1.00)
GFR, Estimated: >60 mL/min
Glucose, Bld: 188 mg/dL — ABNORMAL HIGH (ref 70–99)
Potassium: 4.6 mmol/L (ref 3.5–5.1)
Sodium: 136 mmol/L (ref 135–145)

## 2024-10-17 LAB — MAGNESIUM: Magnesium: 2 mg/dL (ref 1.7–2.4)

## 2024-10-17 LAB — CULTURE, BLOOD (ROUTINE X 2)
Culture: NO GROWTH
Culture: NO GROWTH

## 2024-10-17 MED ORDER — SODIUM CHLORIDE 0.9 % IV SOLN
1.0000 g | INTRAVENOUS | Status: AC
  Administered 2024-10-17: 1 g via INTRAVENOUS
  Filled 2024-10-17: qty 10

## 2024-10-17 NOTE — TOC Transition Note (Addendum)
 Transition of Care [facility]) - Discharge Note   Patient Details  Name: [name] MRN: [id number] Date of Birth: 06/12/1956  [facility]) CM/SW Contact:  Sonda Manuella Quill, RN Phone Number: 10/17/2024, 10:01 AM   Clinical Narrative:    D/C orders received; notified pt needs assist w/ transportation; spoke w/ pt in room; pt said she does not have a ride home because her son is hospitalized and other family is at work; pt said she cannot afford to pay for transportation; she gave D/C address: [address] [place] [phone]; [employer] Waiver and Release of Liability signed and placed on shadow chart; taxi voucher given to Chad, [employer]; he will call [employer] and arrange transportation; pt also agreed to receive resources for transportation; pt verbal understanding she will need to make her own appt w/ agency of choice; no IP CM needs.   Final next level of care: Home/Self Care Barriers to Discharge: No Barriers Identified   Patient Goals and CMS Choice            Discharge Placement                       Discharge Plan and Services Additional resources added to the After Visit Summary for   In-house Referral: Clinical Social Work              DME Arranged: N/A DME Agency: NA       HH Arranged: NA HH Agency: NA        Social Drivers of Health (SDOH) Interventions SDOH Screenings   Food Insecurity: No Food Insecurity (10/15/2024)  Housing: Low Risk (10/15/2024)  Transportation Needs: Unmet Transportation Needs (10/15/2024)  Utilities: Not At Risk (10/15/2024)  Depression (PHQ2-9): Low Risk (07/11/2024)  Social Connections: Socially Isolated (10/15/2024)  Tobacco Use: Low Risk (10/15/2024)     Readmission Risk Interventions     No data to display

## 2024-10-17 NOTE — Discharge Summary (Signed)
 Physician Discharge Summary  GEISHA ABERNATHY FMW:[id number] DOB: 1956/04/02 DOA: 10/15/2024  PCP: Oley Bascom RAMAN, NP  Admit date: 10/15/2024 Discharge date: 10/17/2024  Admitted From: Home Disposition: Home  Recommendations for Outpatient Follow-up:  Follow up with PCP in 1 week with repeat CBC/BMP Follow up in ED if symptoms worsen or new appear   Home Health: No Equipment/Devices: None  Discharge Condition: Stable CODE STATUS: Full Diet recommendation: Heart healthy  Brief/Interim Summary: 69 y.o. female with medical history significant of adrenal insufficiency on chronic hydrocortisone , remote history of atrial myxoma s/p resection, history of embolic stroke with left eye blindness, HLD, hypothyroidism, chronic migraines, PUD, symptomatic NSVT/PVCs, depression presented with generalized weakness, myalgias, nausea/vomiting and nonbloody diarrhea with poor oral intake.  On presentation, she was tachycardic with orthostatic hypotension with dizziness.  Creatinine 1.15, magnesium  1.5, no leukocytosis.  COVID/influenza/RSV PCR negative.  UA suggestive of UTI.  Portable chest x-ray showed no acute findings.  She was started on IV fluids, antibiotics and IV Solu-Cortef .  During the hospitalization, her condition has improved.  Diet has been advanced and she is tolerating it.  She feels much better and wants to go home.  She will be discharged home today with outpatient follow-up with PCP.    Discharge Diagnoses:   Hypotension Possible adrenal crisis - Treated with IV fluids.  Blood pressure has improved.  Currently on Solu-Cortef .  Switch to oral home hydrocortisone  on discharge today.  Encourage oral intake. - Discharge patient home today.  Outpatient follow-up with PCP.  SIRS: Resolved - Possibly from adrenal crisis.  Still intermittently tachycardic.  Blood cultures negative so far.  Urine culture grew multiple species.   Possible UTI: Present on admission -Urine culture as above.   Discharge home today after third dose of Rocephin .   Nausea and vomiting Poor oral intake GERD - Possibly from adrenal crisis.  Improving.  Treated with IV fluids.  D advanced diet.  Continue oral PPI on discharge.  Hypothyroidism Continue levothyroxine    History of specified CVA with residual deficit Left eye blindness - No acute issues or neurologic changes.  PT eval.   Frequent PVCs - Outpatient follow-up   Hypercalcemia -Resolved   Hypomagnesemia - Resolved   Diarrhea - No diarrhea since admission.  DC'd stool testing or enteric precautions.   Depression - Continue Effexor    Obesity class I - Outpatient follow-up   Discharge Instructions  Discharge Instructions     Diet general   Complete by: As directed    Increase activity slowly   Complete by: As directed       Allergies as of 10/17/2024       Reactions   Aspirin  Other (See Comments)   Can take baby aspirin  (heart flutters with high doses)   Ibuprofen [ibuprofen] Nausea And Vomiting   Penicillins Nausea And Vomiting        Medication List     TAKE these medications    aspirin  EC 81 MG tablet Take 81 mg by mouth daily. Swallow whole.   Emgality  120 MG/ML Soaj Generic drug: Galcanezumab -gnlm Inject 120 mg into the skin every 30 (thirty) days.   fenofibrate  48 MG tablet Commonly known as: TRICOR  Take 1 tablet (48 mg total) by mouth daily.   hydrocortisone  10 MG tablet Commonly known as: CORTEF  Take 5-10 mg by mouth See admin instructions. Take 10mg  with breakfast and 5mg  at 3pm.   hydroxypropyl methylcellulose / hypromellose 2.5 % ophthalmic solution Commonly known as: ISOPTO TEARS / GONIOVISC Place  1 drop into both eyes 4 (four) times daily as needed for dry eyes.   hydrOXYzine  10 MG tablet Commonly known as: ATARAX  TAKE ONE TABLET BY MOUTH AT BEDTIME AS NEEDED   levothyroxine  75 MCG tablet Commonly known as: SYNTHROID  Take 1 tablet (75 mcg total) by mouth daily before  breakfast.   nitroGLYCERIN  0.4 MG SL tablet Commonly known as: NITROSTAT  Place 1 tablet (0.4 mg total) under the tongue every 5 (five) minutes as needed for chest pain   Nurtec 75 MG Tbdp Generic drug: Rimegepant Sulfate Take 1 tablet (75 mg total) by mouth daily as needed. For migraines. Take as close to onset of migraine as possible. One daily maximum.   omega-3 acid ethyl esters 1 g capsule Commonly known as: LOVAZA  TAKE ONE CAPSULE BY MOUTH TWICE DAILY   ondansetron  4 MG disintegrating tablet Commonly known as: ZOFRAN -ODT Take 1 tablet (4 mg total) by mouth every 8 (eight) hours as needed for nausea or vomiting.   pantoprazole  20 MG tablet Commonly known as: PROTONIX  Take 1 tablet (20 mg total) by mouth daily.   rosuvastatin  10 MG tablet Commonly known as: CRESTOR  Take 1 tablet (10 mg total) by mouth daily.   venlafaxine  XR 150 MG 24 hr capsule Commonly known as: EFFEXOR -XR Take 1 capsule (150 mg total) by mouth daily.   Vitamin D  (Ergocalciferol ) 1.25 MG (50000 UNIT) Caps capsule Commonly known as: DRISDOL  TAKE ONE CAPSULE BY MOUTH EVERY SEVEN DAYS        Follow-up Information     Oley Bascom RAMAN, NP. Schedule an appointment as soon as possible for a visit in 1 week(s).   Specialties: Pulmonary Disease, Endocrinology Contact information: [address] [place] [address] [phone]                Allergies[1]  Consultations: None   Procedures/Studies: DG Chest Port 1 View Result Date: 10/15/2024 EXAM: 1 VIEW XRAY OF THE CHEST 10/15/2024 04:10:00 AM COMPARISON: Portable chest 06/07/2024. Comparison to prior study reveals no significant interval change. CLINICAL HISTORY: Questionable sepsis - evaluate for abnormality. Questionable sepsis; evaluate for abnormality. FINDINGS: LINES, TUBES AND DEVICES: Epicardial pacing wires. LUNGS AND PLEURA: No focal pulmonary opacity. No pleural effusion. No pneumothorax. HEART AND MEDIASTINUM: Mild  cardiomegaly. No evidence of CHF. Stable mediastinum. BONES AND SOFT TISSUES: Thoracic spondylosis and osteopenia. Intact sternotomy sutures. Scattered surgical clips in the upper abdomen. IMPRESSION: 1. No acute findings. Stable exam. Electronically signed by: Francis Quam MD 10/15/2024 04:38 AM EST RP Workstation: [id number]      Subjective: Patient seen and examined at bedside.  Feels better and wants to go home today.  Tolerating diet.  No fever, vomiting, diarrhea reported.  Discharge Exam: Vitals:   10/16/24 2007 10/17/24 0410  BP: 118/67 136/89  Pulse: (!) 102 96  Resp:  18  Temp: 97.8 F (36.6 C) 97.7 F (36.5 C)  SpO2: 100% 100%    General: Pt is alert, awake, not in acute distress.  Looks chronically ill and deconditioned.  On room air.  Slow to respond.  Flat affect. Cardiovascular: rate controlled, S1/S2 + Respiratory: bilateral decreased breath sounds at bases Abdominal: Soft, obese, NT, ND, bowel sounds + Extremities: no edema, no cyanosis    The results of significant diagnostics from this hospitalization (including imaging, microbiology, ancillary and laboratory) are listed below for reference.     Microbiology: Recent Results (from the past 240 hours)  Resp panel by RT-PCR (RSV, Flu A&B, Covid) Anterior  Nasal Swab     Status: None   Collection Time: 10/15/24  3:56 AM   Specimen: Anterior Nasal Swab  Result Value Ref Range Status   SARS Coronavirus 2 by RT PCR NEGATIVE NEGATIVE Final    Comment: (NOTE) SARS-CoV-2 target nucleic acids are NOT DETECTED.  The SARS-CoV-2 RNA is generally detectable in upper respiratory specimens during the acute phase of infection. The lowest concentration of SARS-CoV-2 viral copies this assay can detect is 138 copies/mL. A negative result does not preclude SARS-Cov-2 infection and should not be used as the sole basis for treatment or other patient management decisions. A negative result may occur with  improper specimen  collection/handling, submission of specimen other than nasopharyngeal swab, presence of viral mutation(s) within the areas targeted by this assay, and inadequate number of viral copies(<138 copies/mL). A negative result must be combined with clinical observations, patient history, and epidemiological information. The expected result is Negative.  Fact Sheet for Patients:  bloggercourse.com  Fact Sheet for Healthcare Providers:  seriousbroker.it  This test is no t yet approved or cleared by the [place]  FDA and  has been authorized for detection and/or diagnosis of SARS-CoV-2 by FDA under an Emergency Use Authorization (EUA). This EUA will remain  in effect (meaning this test can be used) for the duration of the COVID-19 declaration under Section 564(b)(1) of the Act, 21 U.S.C.section 360bbb-3(b)(1), unless the authorization is terminated  or revoked sooner.       Influenza A by PCR NEGATIVE NEGATIVE Final   Influenza B by PCR NEGATIVE NEGATIVE Final    Comment: (NOTE) The Xpert Xpress SARS-CoV-2/FLU/RSV plus assay is intended as an aid in the diagnosis of influenza from Nasopharyngeal swab specimens and should not be used as a sole basis for treatment. Nasal washings and aspirates are unacceptable for Xpert Xpress SARS-CoV-2/FLU/RSV testing.  Fact Sheet for Patients: bloggercourse.com  Fact Sheet for Healthcare Providers: seriousbroker.it  This test is not yet approved or cleared by the [place]  FDA and has been authorized for detection and/or diagnosis of SARS-CoV-2 by FDA under an Emergency Use Authorization (EUA). This EUA will remain in effect (meaning this test can be used) for the duration of the COVID-19 declaration under Section 564(b)(1) of the Act, 21 U.S.C. section 360bbb-3(b)(1), unless the authorization is terminated or revoked.     Resp Syncytial  Virus by PCR NEGATIVE NEGATIVE Final    Comment: (NOTE) Fact Sheet for Patients: bloggercourse.com  Fact Sheet for Healthcare Providers: seriousbroker.it  This test is not yet approved or cleared by the [place]  FDA and has been authorized for detection and/or diagnosis of SARS-CoV-2 by FDA under an Emergency Use Authorization (EUA). This EUA will remain in effect (meaning this test can be used) for the duration of the COVID-19 declaration under Section 564(b)(1) of the Act, 21 U.S.C. section 360bbb-3(b)(1), unless the authorization is terminated or revoked.  Performed at [facility], [address] [place], [place] [address]   Urine Culture     Status: Abnormal   Collection Time: 10/15/24  5:16 AM   Specimen: Urine, Random  Result Value Ref Range Status   Specimen Description   Final    URINE, RANDOM Performed at [facility], [address] [place], [place] [address]    Special Requests   Final    NONE Reflexed from [id number] Performed at [facility], [address], [place], [place] [address]    Culture MULTIPLE SPECIES PRESENT, SUGGEST  RECOLLECTION (A)  Final   Report Status 10/16/2024 FINAL  Final  Blood Culture (routine x 2)     Status: None (Preliminary result)   Collection Time: 10/15/24  5:20 AM   Specimen: BLOOD  Result Value Ref Range Status   Specimen Description   Final    BLOOD BLOOD RIGHT ARM Performed at [facility], [address], [place], [place] [address]    Special Requests   Final    BOTTLES DRAWN AEROBIC AND ANAEROBIC Blood Culture results may not be optimal due to an inadequate volume of blood received in culture bottles Performed at [facility], [address], [place], [place] [address]    Culture   Final    NO GROWTH 1 DAY Performed at [facility], [address] [place], [place]  [address]    Report Status PENDING  Incomplete  Blood Culture (routine x 2)     Status: None (Preliminary result)   Collection Time: 10/15/24  5:30 AM   Specimen: BLOOD  Result Value Ref Range Status   Specimen Description   Final    BLOOD BLOOD LEFT ARM Performed at [facility], [address] [place], [place] [address]    Special Requests   Final    BOTTLES DRAWN AEROBIC AND ANAEROBIC Blood Culture results may not be optimal due to an inadequate volume of blood received in culture bottles Performed at [facility], [address] [place], [place] [address]    Culture   Final    NO GROWTH 1 DAY Performed at [facility], [address], [place], [place] [address]    Report Status PENDING  Incomplete     Labs: BNP (last 3 results) No results for input(s): BNP in the last 8760 hours. Basic Metabolic Panel: Recent Labs  Lab 10/15/24 0520 10/15/24 1317 10/16/24 0416 10/17/24 0404  NA 133*  --  135 136  K 4.9  --  4.8 4.6  CL 94*  --  103 103  CO2 25  --  23 23  GLUCOSE 98  --  181* 188*  BUN 20  --  11 16  CREATININE 1.15* 1.02* 0.95 0.94  CALCIUM  10.8*  --  9.6 9.9  MG 1.5*  --  1.8 2.0   Liver Function Tests: Recent Labs  Lab 10/15/24 0520  AST 37  ALT 16  ALKPHOS 61  BILITOT 0.5  PROT 8.1  ALBUMIN 4.0   Recent Labs  Lab 10/15/24 0520  LIPASE 15   No results for input(s): AMMONIA in the last 168 hours. CBC: Recent Labs  Lab 10/15/24 0520 10/16/24 0416 10/17/24 0404  WBC 7.1 6.3 13.2*  NEUTROABS  --   --  11.4*  HGB 14.8 11.3* 10.8*  HCT 44.7 34.5* 34.2*  MCV 90.7 90.6 92.9  PLT 410* 321 338   Cardiac Enzymes: Recent Labs  Lab 10/15/24 1317  CKTOTAL 197   BNP: Invalid input(s): POCBNP CBG: Recent Labs  Lab 10/16/24 1618 10/16/24 2006 10/17/24 0006 10/17/24 0403 10/17/24 0734  GLUCAP 98 107* 160* 172* 110*   D-Dimer Recent Labs    10/15/24 1627  DDIMER 0.90*   Hgb A1c No  results for input(s): HGBA1C in the last 72 hours. Lipid Profile No results for input(s): CHOL, HDL, LDLCALC, TRIG, CHOLHDL, LDLDIRECT in the last 72 hours. Thyroid  function studies No results for input(s): TSH, T4TOTAL, T3FREE, THYROIDAB in the last 72 hours.  Invalid  input(s): FREET3 Anemia work up No results for input(s): VITAMINB12, FOLATE, FERRITIN, TIBC, IRON, RETICCTPCT in the last 72 hours. Urinalysis    Component Value Date/Time   COLORURINE AMBER (A) 10/15/2024 0516   APPEARANCEUR CLOUDY (A) 10/15/2024 0516   LABSPEC 1.023 10/15/2024 0516   PHURINE 5.0 10/15/2024 0516   GLUCOSEU NEGATIVE 10/15/2024 0516   HGBUR NEGATIVE 10/15/2024 0516   BILIRUBINUR NEGATIVE 10/15/2024 0516   BILIRUBINUR small (A) 05/30/2021 1053   BILIRUBINUR neg 09/27/2018 1013   KETONESUR NEGATIVE 10/15/2024 0516   PROTEINUR 30 (A) 10/15/2024 0516   UROBILINOGEN 1.0 05/30/2021 1053   UROBILINOGEN 1.0 11/02/2013 1127   NITRITE NEGATIVE 10/15/2024 0516   LEUKOCYTESUR LARGE (A) 10/15/2024 0516   Sepsis Labs Recent Labs  Lab 10/15/24 0520 10/16/24 0416 10/17/24 0404  WBC 7.1 6.3 13.2*   Microbiology Recent Results (from the past 240 hours)  Resp panel by RT-PCR (RSV, Flu A&B, Covid) Anterior Nasal Swab     Status: None   Collection Time: 10/15/24  3:56 AM   Specimen: Anterior Nasal Swab  Result Value Ref Range Status   SARS Coronavirus 2 by RT PCR NEGATIVE NEGATIVE Final    Comment: (NOTE) SARS-CoV-2 target nucleic acids are NOT DETECTED.  The SARS-CoV-2 RNA is generally detectable in upper respiratory specimens during the acute phase of infection. The lowest concentration of SARS-CoV-2 viral copies this assay can detect is 138 copies/mL. A negative result does not preclude SARS-Cov-2 infection and should not be used as the sole basis for treatment or other patient management decisions. A negative result may occur with  improper specimen  collection/handling, submission of specimen other than nasopharyngeal swab, presence of viral mutation(s) within the areas targeted by this assay, and inadequate number of viral copies(<138 copies/mL). A negative result must be combined with clinical observations, patient history, and epidemiological information. The expected result is Negative.  Fact Sheet for Patients:  bloggercourse.com  Fact Sheet for Healthcare Providers:  seriousbroker.it  This test is no t yet approved or cleared by the [place]  FDA and  has been authorized for detection and/or diagnosis of SARS-CoV-2 by FDA under an Emergency Use Authorization (EUA). This EUA will remain  in effect (meaning this test can be used) for the duration of the COVID-19 declaration under Section 564(b)(1) of the Act, 21 U.S.C.section 360bbb-3(b)(1), unless the authorization is terminated  or revoked sooner.       Influenza A by PCR NEGATIVE NEGATIVE Final   Influenza B by PCR NEGATIVE NEGATIVE Final    Comment: (NOTE) The Xpert Xpress SARS-CoV-2/FLU/RSV plus assay is intended as an aid in the diagnosis of influenza from Nasopharyngeal swab specimens and should not be used as a sole basis for treatment. Nasal washings and aspirates are unacceptable for Xpert Xpress SARS-CoV-2/FLU/RSV testing.  Fact Sheet for Patients: bloggercourse.com  Fact Sheet for Healthcare Providers: seriousbroker.it  This test is not yet approved or cleared by the [place]  FDA and has been authorized for detection and/or diagnosis of SARS-CoV-2 by FDA under an Emergency Use Authorization (EUA). This EUA will remain in effect (meaning this test can be used) for the duration of the COVID-19 declaration under Section 564(b)(1) of the Act, 21 U.S.C. section 360bbb-3(b)(1), unless the authorization is terminated or revoked.     Resp Syncytial  Virus by PCR NEGATIVE NEGATIVE Final    Comment: (NOTE) Fact Sheet for Patients: bloggercourse.com  Fact Sheet for Healthcare Providers: seriousbroker.it  This test is not yet approved or cleared by the  [place]  FDA and has been authorized for detection and/or diagnosis of SARS-CoV-2 by FDA under an Emergency Use Authorization (EUA). This EUA will remain in effect (meaning this test can be used) for the duration of the COVID-19 declaration under Section 564(b)(1) of the Act, 21 U.S.C. section 360bbb-3(b)(1), unless the authorization is terminated or revoked.  Performed at [facility], [address] [place], [place] [address]   Urine Culture     Status: Abnormal   Collection Time: 10/15/24  5:16 AM   Specimen: Urine, Random  Result Value Ref Range Status   Specimen Description   Final    URINE, RANDOM Performed at [facility], [address], [place], [place] [address]    Special Requests   Final    NONE Reflexed from [id number] Performed at [facility], [address], [place], [place] [address]    Culture MULTIPLE SPECIES PRESENT, SUGGEST RECOLLECTION (A)  Final   Report Status 10/16/2024 FINAL  Final  Blood Culture (routine x 2)     Status: None (Preliminary result)   Collection Time: 10/15/24  5:20 AM   Specimen: BLOOD  Result Value Ref Range Status   Specimen Description   Final    BLOOD BLOOD RIGHT ARM Performed at [facility], [address], [place], [place] [address]    Special Requests   Final    BOTTLES DRAWN AEROBIC AND ANAEROBIC Blood Culture results may not be optimal due to an inadequate volume of blood received in culture bottles Performed at [facility], [address] [place], [place] [address]    Culture   Final    NO GROWTH 1 DAY Performed at [facility], [address], [place], [place]  [address]    Report Status PENDING  Incomplete  Blood Culture (routine x 2)     Status: None (Preliminary result)   Collection Time: 10/15/24  5:30 AM   Specimen: BLOOD  Result Value Ref Range Status   Specimen Description   Final    BLOOD BLOOD LEFT ARM Performed at [facility], [address] [place], [place] [address]    Special Requests   Final    BOTTLES DRAWN AEROBIC AND ANAEROBIC Blood Culture results may not be optimal due to an inadequate volume of blood received in culture bottles Performed at [facility], [address], [place], [place] [address]    Culture   Final    NO GROWTH 1 DAY Performed at [facility], [address] [place], [place] [address]    Report Status PENDING  Incomplete     Time coordinating discharge: 35 minutes  SIGNED:   Sophie Mao, MD  [facility] 10/17/2024, 8:01 AM      [1]  Allergies Allergen Reactions   Aspirin  Other (See Comments)    Can take baby aspirin  (heart flutters with high doses)   Ibuprofen [Ibuprofen] Nausea And Vomiting   Penicillins Nausea And Vomiting

## 2024-11-05 ENCOUNTER — Ambulatory Visit: Admitting: Endocrinology
